# Patient Record
Sex: Male | Born: 1970
Health system: Southern US, Community
[De-identification: ages and names within clinical notes are randomized; demographics above are authoritative.]

## PROBLEM LIST (undated history)

## (undated) DIAGNOSIS — R918 Other nonspecific abnormal finding of lung field: Secondary | ICD-10-CM

## (undated) DIAGNOSIS — K089 Disorder of teeth and supporting structures, unspecified: Secondary | ICD-10-CM

## (undated) DIAGNOSIS — E119 Type 2 diabetes mellitus without complications: Secondary | ICD-10-CM

## (undated) DIAGNOSIS — F1210 Cannabis abuse, uncomplicated: Secondary | ICD-10-CM

## (undated) DIAGNOSIS — J45909 Unspecified asthma, uncomplicated: Secondary | ICD-10-CM

## (undated) DIAGNOSIS — J189 Pneumonia, unspecified organism: Secondary | ICD-10-CM

## (undated) DIAGNOSIS — Z720 Tobacco use: Secondary | ICD-10-CM

## (undated) DIAGNOSIS — IMO0001 Reserved for inherently not codable concepts without codable children: Secondary | ICD-10-CM

## (undated) DIAGNOSIS — I700 Atherosclerosis of aorta: Secondary | ICD-10-CM

## (undated) HISTORY — PX: NO PAST SURGERIES: SHX2092

## (undated) HISTORY — DX: Pneumonia, unspecified organism: J18.9

---

## 1999-04-14 ENCOUNTER — Emergency Department (HOSPITAL_COMMUNITY): Admission: EM | Admit: 1999-04-14 | Discharge: 1999-04-14 | Payer: Self-pay | Admitting: Emergency Medicine

## 2011-09-11 ENCOUNTER — Emergency Department (HOSPITAL_COMMUNITY)
Admission: EM | Admit: 2011-09-11 | Discharge: 2011-09-11 | Disposition: A | Discharge status: Home / Self Care | Payer: Self-pay | Attending: Emergency Medicine | Admitting: Emergency Medicine

## 2011-09-11 DIAGNOSIS — R059 Cough, unspecified: Secondary | ICD-10-CM | POA: Insufficient documentation

## 2011-09-11 DIAGNOSIS — R0602 Shortness of breath: Secondary | ICD-10-CM | POA: Insufficient documentation

## 2011-09-11 DIAGNOSIS — R05 Cough: Secondary | ICD-10-CM | POA: Insufficient documentation

## 2011-09-11 DIAGNOSIS — J45901 Unspecified asthma with (acute) exacerbation: Secondary | ICD-10-CM | POA: Insufficient documentation

## 2011-09-11 DIAGNOSIS — R0789 Other chest pain: Secondary | ICD-10-CM | POA: Insufficient documentation

## 2013-04-29 ENCOUNTER — Encounter (HOSPITAL_COMMUNITY): Payer: Self-pay

## 2013-04-29 ENCOUNTER — Emergency Department (HOSPITAL_COMMUNITY)
Admission: EM | Admit: 2013-04-29 | Discharge: 2013-04-29 | Disposition: A | Discharge status: Home / Self Care | Payer: Self-pay | Attending: Emergency Medicine | Admitting: Emergency Medicine

## 2013-04-29 DIAGNOSIS — Z79899 Other long term (current) drug therapy: Secondary | ICD-10-CM | POA: Insufficient documentation

## 2013-04-29 DIAGNOSIS — J45901 Unspecified asthma with (acute) exacerbation: Secondary | ICD-10-CM | POA: Insufficient documentation

## 2013-04-29 DIAGNOSIS — Z880 Allergy status to penicillin: Secondary | ICD-10-CM | POA: Insufficient documentation

## 2013-04-29 HISTORY — DX: Unspecified asthma, uncomplicated: J45.909

## 2013-04-29 MED ORDER — PREDNISONE 20 MG PO TABS: ORAL_TABLET | ORAL | Status: DC

## 2013-04-29 MED ORDER — ALBUTEROL SULFATE HFA 108 (90 BASE) MCG/ACT IN AERS: 2.0000 | INHALATION_SPRAY | RESPIRATORY_TRACT | Status: DC | PRN

## 2013-04-29 MED ORDER — METHYLPREDNISOLONE SODIUM SUCC 125 MG IJ SOLR: 125.0000 mg | Freq: Once | INTRAMUSCULAR | Status: DC

## 2013-04-29 MED ORDER — ALBUTEROL (5 MG/ML) CONTINUOUS INHALATION SOLN
10.0000 mg/h | INHALATION_SOLUTION | Freq: Once | RESPIRATORY_TRACT | Status: AC
  Administered 2013-04-29: 10 mg/h via RESPIRATORY_TRACT
  Filled 2013-04-29: qty 20

## 2013-04-29 NOTE — ED Provider Notes (Signed)
History     CSN: [id number]  Arrival date & time 04/29/13  0804   First MD Initiated Contact with Patient 04/29/13 [phone]      Chief Complaint  Patient presents with  . Respiratory Distress    (Consider location/radiation/quality/duration/timing/severity/associated sxs/prior treatment) Patient is a 42 y.o. male presenting with shortness of breath. The history is provided by the patient and medical records. No language interpreter was used.  Shortness of Breath Severity:  Moderate Onset quality:  Sudden Duration:  2 hours (Pt woke up this morning, got a drink of water, went back to bed, and had onset of wheezing.  He had no relief with use of his albuterol inhaler.) Timing:  Constant Progression:  Unchanged Chronicity:  Recurrent Context comment:  No precipitating event known to pt.  Relieved by:  Nothing Worsened by:  Nothing tried Ineffective treatments:  Inhaler Associated symptoms: wheezing   Associated symptoms: no fever   Associated symptoms comment:  None. Risk factors: no tobacco use   Risk factors comment:  Pt was given IV Solumedrol by EMS.   Past Medical History  Diagnosis Date  . Asthma     Past Surgical History  Procedure Laterality Date  . No past surgeries      No family history on file.  History  Substance Use Topics  . Smoking status: Never Smoker   . Smokeless tobacco: Not on file  . Alcohol Use: Yes      Review of Systems  Constitutional: Negative for fever and chills.  HENT: Negative.   Eyes: Negative.   Respiratory: Positive for shortness of breath and wheezing.   Cardiovascular: Negative.   Gastrointestinal: Negative.   Genitourinary: Negative.   Musculoskeletal: Negative.   Skin: Negative.   Neurological: Negative.   Psychiatric/Behavioral: Negative.     Allergies  Penicillins  Home Medications   Current Outpatient Rx  Name  Route  Sig  Dispense  Refill  . albuterol (PROVENTIL HFA;VENTOLIN HFA) 108 (90 BASE) MCG/ACT  inhaler   Inhalation   Inhale 2 puffs into the lungs every 4 (four) hours as needed for wheezing or shortness of breath.           BP 122/84  Pulse 93  Temp(Src) 98.3 F (36.8 C) (Oral)  Resp 20  Ht 5\' 4"  (1.626 m)  Wt 140 lb (63.504 kg)  BMI 24.02 kg/m2  SpO2 100%  Physical Exam  Nursing note and vitals reviewed. Constitutional: He is oriented to person, place, and time. He appears well-developed and well-nourished.  In mild distress with wheezing.  Appears younger than stated age.  HENT:  Head: Normocephalic and atraumatic.  Right Ear: External ear normal.  Left Ear: External ear normal.  Mouth/Throat: Oropharynx is clear and moist.  Eyes: Conjunctivae and EOM are normal. Pupils are equal, round, and reactive to light.  Neck: Normal range of motion. Neck supple.  Cardiovascular: Normal rate, regular rhythm and normal heart sounds.   Pulmonary/Chest: Effort normal.  Wheezes heard over both lung fields.  Abdominal: Soft. Bowel sounds are normal.  Musculoskeletal: Normal range of motion. He exhibits no edema and no tenderness.  Neurological: He is alert and oriented to person, place, and time.  No sensory of motor deficit.  Skin: Skin is warm and dry.  Psychiatric: He has a normal mood and affect. His behavior is normal.    ED Course  Procedures (including critical care time)  8:37 AM Pt was seen and ahd physical examination.  IV Solumedrol 125 mg  and continuous albuterol nebulizer treatment with 10 mg per hour for two hours was ordered.  10:53 AM Pt feels much better.  Re-exam shows no wheezing.  Will release with prescriptions for prednisone taper and albuterol inhaler.  No work for 3 days.    1. Asthma attack         Carleene Cooper III, MD 04/29/13 [phone]

## 2013-04-29 NOTE — ED Notes (Addendum)
Pt woke up this morning with trouble breathing.   Meds given PTA:  Solumedrol 125 mg IVP Albuterol Treatment X1 Atrovent 0.5 mg INH

## 2013-06-12 ENCOUNTER — Emergency Department (HOSPITAL_COMMUNITY): Payer: Medicaid Other

## 2013-06-12 ENCOUNTER — Other Ambulatory Visit: Payer: Self-pay

## 2013-06-12 ENCOUNTER — Observation Stay (HOSPITAL_COMMUNITY)
Admission: EM | Admit: 2013-06-12 | Discharge: 2013-06-13 | Disposition: A | Discharge status: Home / Self Care | Payer: Medicaid Other | Attending: Internal Medicine | Admitting: Internal Medicine

## 2013-06-12 ENCOUNTER — Encounter (HOSPITAL_COMMUNITY): Payer: Self-pay | Admitting: *Deleted

## 2013-06-12 DIAGNOSIS — J45901 Unspecified asthma with (acute) exacerbation: Principal | ICD-10-CM | POA: Insufficient documentation

## 2013-06-12 DIAGNOSIS — J9601 Acute respiratory failure with hypoxia: Secondary | ICD-10-CM | POA: Diagnosis present

## 2013-06-12 DIAGNOSIS — R0602 Shortness of breath: Secondary | ICD-10-CM | POA: Insufficient documentation

## 2013-06-12 DIAGNOSIS — J9600 Acute respiratory failure, unspecified whether with hypoxia or hypercapnia: Secondary | ICD-10-CM

## 2013-06-12 DIAGNOSIS — J45909 Unspecified asthma, uncomplicated: Secondary | ICD-10-CM | POA: Diagnosis present

## 2013-06-12 LAB — POCT I-STAT TROPONIN I: Troponin i, poc: 0 ng/mL (ref 0.00–0.08)

## 2013-06-12 LAB — BASIC METABOLIC PANEL
GFR calc Af Amer: >90 mL/min (ref >90)
GFR calc non Af Amer: 86 mL/min — ABNORMAL LOW (ref >90)
Potassium: 3.9 mEq/L (ref 3.5–5.1)
Sodium: 137 mEq/L (ref 135–145)

## 2013-06-12 LAB — CBC
Hemoglobin: 17.8 g/dL — ABNORMAL HIGH (ref 13.0–17.0)
Platelets: 255 10*3/uL (ref 150–400)
RBC: 5.7 MIL/uL (ref 4.22–5.81)
WBC: 8.2 10*3/uL (ref 4.0–10.5)

## 2013-06-12 MED ORDER — ALBUTEROL SULFATE (5 MG/ML) 0.5% IN NEBU
2.5000 mg | INHALATION_SOLUTION | Freq: Four times a day (QID) | RESPIRATORY_TRACT | Status: DC
  Administered 2013-06-12 (×3): 2.5 mg via RESPIRATORY_TRACT
  Filled 2013-06-12 (×3): qty 0.5

## 2013-06-12 MED ORDER — SODIUM CHLORIDE 0.9 % IJ SOLN: 3.0000 mL | INTRAMUSCULAR | Status: DC | PRN

## 2013-06-12 MED ORDER — IPRATROPIUM BROMIDE 0.02 % IN SOLN
0.5000 mg | Freq: Two times a day (BID) | RESPIRATORY_TRACT | Status: DC
  Administered 2013-06-13: 0.5 mg via RESPIRATORY_TRACT
  Filled 2013-06-12: qty 2.5

## 2013-06-12 MED ORDER — AZITHROMYCIN 250 MG PO TABS
250.0000 mg | ORAL_TABLET | Freq: Every day | ORAL | Status: DC
  Administered 2013-06-13: 250 mg via ORAL
  Filled 2013-06-12: qty 1

## 2013-06-12 MED ORDER — IPRATROPIUM BROMIDE 0.02 % IN SOLN
0.5000 mg | Freq: Four times a day (QID) | RESPIRATORY_TRACT | Status: DC
  Administered 2013-06-12 (×3): 0.5 mg via RESPIRATORY_TRACT
  Filled 2013-06-12 (×3): qty 2.5

## 2013-06-12 MED ORDER — PNEUMOCOCCAL VAC POLYVALENT 25 MCG/0.5ML IJ INJ
0.5000 mL | INJECTION | INTRAMUSCULAR | Status: AC
  Administered 2013-06-13: 0.5 mL via INTRAMUSCULAR
  Filled 2013-06-12: qty 0.5

## 2013-06-12 MED ORDER — ALBUTEROL SULFATE (5 MG/ML) 0.5% IN NEBU
2.5000 mg | INHALATION_SOLUTION | Freq: Two times a day (BID) | RESPIRATORY_TRACT | Status: DC
  Administered 2013-06-13: 2.5 mg via RESPIRATORY_TRACT
  Filled 2013-06-12: qty 0.5

## 2013-06-12 MED ORDER — ALBUTEROL SULFATE (5 MG/ML) 0.5% IN NEBU
2.5000 mg | INHALATION_SOLUTION | RESPIRATORY_TRACT | Status: DC
  Administered 2013-06-12: 2.5 mg via RESPIRATORY_TRACT
  Filled 2013-06-12: qty 0.5

## 2013-06-12 MED ORDER — SODIUM CHLORIDE 0.9 % IJ SOLN: 3.0000 mL | Freq: Two times a day (BID) | INTRAMUSCULAR | Status: DC

## 2013-06-12 MED ORDER — ALBUTEROL SULFATE (5 MG/ML) 0.5% IN NEBU
5.0000 mg | INHALATION_SOLUTION | Freq: Once | RESPIRATORY_TRACT | Status: AC
  Administered 2013-06-12: 5 mg via RESPIRATORY_TRACT
  Filled 2013-06-12: qty 1

## 2013-06-12 MED ORDER — BUDESONIDE-FORMOTEROL FUMARATE 80-4.5 MCG/ACT IN AERO
2.0000 | INHALATION_SPRAY | Freq: Two times a day (BID) | RESPIRATORY_TRACT | Status: DC
  Administered 2013-06-12 – 2013-06-13 (×2): 2 via RESPIRATORY_TRACT
  Filled 2013-06-12: qty 6.9

## 2013-06-12 MED ORDER — IPRATROPIUM BROMIDE 0.02 % IN SOLN
0.5000 mg | Freq: Once | RESPIRATORY_TRACT | Status: AC
  Administered 2013-06-12: 0.5 mg via RESPIRATORY_TRACT
  Filled 2013-06-12: qty 2.5

## 2013-06-12 MED ORDER — ALBUTEROL SULFATE (5 MG/ML) 0.5% IN NEBU: 2.5000 mg | INHALATION_SOLUTION | RESPIRATORY_TRACT | Status: DC | PRN

## 2013-06-12 MED ORDER — METHYLPREDNISOLONE SODIUM SUCC 125 MG IJ SOLR
80.0000 mg | Freq: Two times a day (BID) | INTRAMUSCULAR | Status: DC
  Administered 2013-06-12: 80 mg via INTRAVENOUS
  Filled 2013-06-12 (×3): qty 1.28

## 2013-06-12 MED ORDER — BIOTENE DRY MOUTH MT LIQD: 15.0000 mL | Freq: Two times a day (BID) | OROMUCOSAL | Status: DC

## 2013-06-12 MED ORDER — SODIUM CHLORIDE 0.9 % IV SOLN: 250.0000 mL | INTRAVENOUS | Status: DC | PRN

## 2013-06-12 MED ORDER — PREDNISONE 20 MG PO TABS
20.0000 mg | ORAL_TABLET | Freq: Two times a day (BID) | ORAL | Status: DC
  Administered 2013-06-12 – 2013-06-13 (×2): 20 mg via ORAL
  Filled 2013-06-12 (×4): qty 1

## 2013-06-12 MED ORDER — AZITHROMYCIN 500 MG PO TABS
500.0000 mg | ORAL_TABLET | Freq: Every day | ORAL | Status: AC
  Administered 2013-06-12: 500 mg via ORAL
  Filled 2013-06-12: qty 1

## 2013-06-12 MED ORDER — SODIUM CHLORIDE 0.9 % IJ SOLN
3.0000 mL | Freq: Two times a day (BID) | INTRAMUSCULAR | Status: DC
  Administered 2013-06-12 – 2013-06-13 (×3): 3 mL via INTRAVENOUS

## 2013-06-12 MED ORDER — METHYLPREDNISOLONE SODIUM SUCC 125 MG IJ SOLR
125.0000 mg | Freq: Once | INTRAMUSCULAR | Status: AC
  Administered 2013-06-12: 125 mg via INTRAVENOUS
  Filled 2013-06-12: qty 2

## 2013-06-12 NOTE — ED Provider Notes (Signed)
History    CSN: [id number] Arrival date & time 06/12/13  0040  First MD Initiated Contact with Patient 06/12/13 0101     Chief Complaint  Patient presents with  . Shortness of Breath   (Consider location/radiation/quality/duration/timing/severity/associated sxs/prior Treatment) HPI Patient is a 42 yo man with a history of asthma who presents with complaints of SOB and wheezing. Obtained history from wife because of degree of SOB.  Wife says that sx began when he came home from work around 1800. Patient has not had cough or fever. He has never been admitted for asthma excacerbation and is a nonsmoker. He works as a Financial risk analyst.   Past Medical History  Diagnosis Date  . Asthma    Past Surgical History  Procedure Laterality Date  . No past surgeries     No family history on file. History  Substance Use Topics  . Smoking status: Never Smoker   . Smokeless tobacco: Not on file  . Alcohol Use: Yes    Review of Systems Initially unable to obtain from the patient who presented with acute respiratory distress.  Allergies  Penicillins  Home Medications   Current Outpatient Rx  Name  Route  Sig  Dispense  Refill  . albuterol (PROVENTIL HFA;VENTOLIN HFA) 108 (90 BASE) MCG/ACT inhaler   Inhalation   Inhale 2 puffs into the lungs every 4 (four) hours as needed for wheezing.   1 Inhaler   0    BP 110/70  Pulse 83  Resp 13  SpO2 85% Physical Exam Gen: well developed and well nourished appearing, appears in acute respiratory distress, tripod position, room air sats of 75% noted in triage. Head: NCAT Eyes: PERL, EOMI Nose: no epistaixis or rhinorrhea Mouth/throat: mucosa is moist and pink Neck: supple, no stridor Lungs: Severely diminished air movement bilaterally in all lung fields with an occasional high-pitched wheeze, respiratory rate 20-32 times per min, accessory muscle use noted. CV-sinus tach, regular rate, 20 for her well perfused. Abd: soft, notender,  nondistended Back: no ttp, no cva ttp Skin: Diaphoretic Neuro: CN ii-xii grossly intact, no focal deficits Psyche; anxious affect   ED Course  Procedures (including critical care time)  Results for orders placed during the hospital encounter of 06/12/13 (from the past 24 hour(s))  BASIC METABOLIC PANEL     Status: Abnormal   Collection Time    06/12/13 12:54 AM      Result Value Range   Sodium 137  135 - 145 mEq/L   Potassium 3.9  3.5 - 5.1 mEq/L   Chloride 100  96 - 112 mEq/L   CO2 28  19 - 32 mEq/L   Glucose, Bld 107 (*) 70 - 99 mg/dL   BUN 20  6 - 23 mg/dL   Creatinine, Ser 8.65  0.50 - 1.35 mg/dL   Calcium 9.3  8.4 - 78.4 mg/dL   GFR calc non Af Amer 86 (*) >90 mL/min   GFR calc Af Amer >90  >90 mL/min  CBC     Status: Abnormal   Collection Time    06/12/13 12:54 AM      Result Value Range   WBC 8.2  4.0 - 10.5 K/uL   RBC 5.70  4.22 - 5.81 MIL/uL   Hemoglobin 17.8 (*) 13.0 - 17.0 g/dL   HCT 69.6  29.5 - 28.4 %   MCV 90.0  78.0 - 100.0 fL   MCH 31.2  26.0 - 34.0 pg   MCHC 34.7  30.0 -  36.0 g/dL   RDW 16.1  09.6 - 04.5 %   Platelets 255  150 - 400 K/uL  POCT I-STAT TROPONIN I     Status: None   Collection Time    06/12/13  1:04 AM      Result Value Range   Troponin i, poc 0.00  0.00 - 0.08 ng/mL   Comment 3              Dg Chest Portable 1 View  06/12/2013   *RADIOLOGY REPORT*  Clinical Data: Shortness of breath.  PORTABLE CHEST - 1 VIEW  Comparison: None.  Findings: No significant osseous abnormality.  Lungs are clear. No effusion or pneumothorax.  Cardiomediastinal size and contour are within normal limits.  The upper abdomen is unremarkable.  IMPRESSION: No evidence of acute cardiopulmonary disease.   Original Report Authenticated By: Tiburcio Pea   No diagnosis found.  MDM  Patient presented with status asthmatic and acute respiratory distress. Tx with continous nebs and solumedrol. CXR clear. Patient improved. looks and feels better and wheezing has nearly  resolved. Without O2, sats are 93%. However, the patient desats to 85% with ambulation.  Thus, will admit. Case discussed with Dr. Onalee Hua who has accepted to Tele Unit.   CRITICAL CARE Performed by: Brandt Loosen   Total critical care time: 20m  Critical care time was exclusive of separately billable procedures and treating other patients.  Critical care was necessary to treat or prevent imminent or life-threatening deterioration.  Critical care was time spent personally by me on the following activities: development of treatment plan with patient and/or surrogate as well as nursing, discussions with consultants, evaluation of patient's response to treatment, examination of patient, obtaining history from patient or surrogate, ordering and performing treatments and interventions, ordering and review of laboratory studies, ordering and review of radiographic studies, pulse oximetry and re-evaluation of patient's condition.   Brandt Loosen, MD 06/12/13 [id number]

## 2013-06-12 NOTE — ED Notes (Signed)
Pt presents with audible wheezes and tripod positioning. Able to speak in short sentences. States has been out of albuterol inhaler x1 day

## 2013-06-12 NOTE — Care Management Note (Signed)
    Page 1 of 1   06/13/2013     11:45:12 AM   CARE MANAGEMENT NOTE 06/13/2013  Patient:  ANUEL, SITTER   Account Number:  [id number]  Date Initiated:  06/12/2013  Documentation initiated by:  Letha Cape  Subjective/Objective Assessment:   dx acute resp failure  admit as observation- lives with family. pta indep.     Action/Plan:   Anticipated DC Date:  06/13/2013   Anticipated DC Plan:  HOME/SELF CARE      DC Planning Services  CM consult  [facility]  [facility]      Choice offered to / List presented to:             Status of service:  Completed, signed off Medicare Important Message given?   (If response is "NO", the following Medicare IM given date fields will be blank) Date Medicare IM given:   Date Additional Medicare IM given:    Discharge Disposition:  HOME/SELF CARE  Per UR Regulation:  Reviewed for med. necessity/level of care/duration of stay  If discussed at Long Length of Stay Meetings, dates discussed:    Comments:  06/13/13 11:42 Letha Cape RN, BSN [phone] patient is for dc today, appt scheduled at the [facility], and Match Letter given to patient to ast with medications.  Patient has transportation.  Also gave patient paperwork to fill out for eligibility apt for orange card at [facility] clinic.  716/14 16:06 Letha Cape RN, BSN [phone] patient lives with family, pta indep.  patient states he will need ast with meds, he would like to use the [facility],  also gave info to [facility] , awaiting a call back for apt time.  Spoke with MD and he states patient is for possible dc tomorrow.  Patient states he has transportation at dc.

## 2013-06-12 NOTE — Progress Notes (Signed)
[facility] PROGRESS NOTE  Vera Wishart ZOX:[id number] DOB: 08/28/1971 DOA: 06/12/2013 PCP: No PCP Per Patient  HPI:  42 yo male h/o asthma comes in with sob and wheezing since 10pm. Denies cough or fevers. Ran out of his albuterol inhaler 2 days ago. Does not have pcp. No uri symptoms. No sick contacts. Not on any other inhalers except alb which is usually uses 2-3 times a day. On pred tapers 2-4 x a year through urgent cares. Has received solumedrol and freq nebs in ED and feels much better. Initial oxygen sats were below 80% on arrival this has improved, but upon ambulation sats drop. No le edema or swelling. No cp.  Assessment/Plan:  Asthma exacerbation - will need to op his treatment as he is experiencing daily needs of his rescue inhalers and 3 nights/week - continue Zpack/Prednisone - wean off O2 as tolerated - anticipate d/c tomorrow if stable  Code Status: Full Family Communication: family in room  Disposition Plan: home in 1 days  Consultants:  none  Procedures:  none  Antibiotics:  Anti-infectives   Start     Dose/Rate Route Frequency Ordered Stop   06/13/13 1000  azithromycin (ZITHROMAX) tablet 250 mg     250 mg Oral Daily 06/12/13 0529 06/17/13 0959   06/12/13 1000  azithromycin (ZITHROMAX) tablet 500 mg     500 mg Oral Daily 06/12/13 0529 06/12/13 1025     Antibiotics Given (last 72 hours)   Date/Time Action Medication Dose   06/12/13 1025 Given   azithromycin (ZITHROMAX) tablet 500 mg 500 mg     HPI/Subjective: - feeling much better today, breathing seems at baseline per patient.   Objective: Filed Vitals:   06/12/13 0330 06/12/13 0405 06/12/13 0558 06/12/13 1014  BP: 110/70  106/68   Pulse: 83  73   Temp:   98.5 F (36.9 C)   TempSrc:   Oral   Resp: 13  16   Height:   5\' 3"  (1.6 m)   Weight:   75.6 kg (166 lb 10.7 oz)   SpO2: 93% 85% 99% 98%   No intake or output data in the 24 hours ending 06/12/13 1421 Filed Weights   06/12/13 0558   Weight: 75.6 kg (166 lb 10.7 oz)   Exam:  General:  NAD  Cardiovascular: regular rate and rhythm, without MRG  Respiratory: good air movement, clear to auscultation throughout, no wheezing  Abdomen: soft, not tender to palpation, positive bowel sounds  MSK: no peripheral edema  Neuro: CN 2-12 grossly intact, MS 5/5 in all 4  Data Reviewed: Basic Metabolic Panel:  Recent Labs Lab 06/12/13 0054  NA 137  K 3.9  CL 100  CO2 28  GLUCOSE 107*  BUN 20  CREATININE 1.07  CALCIUM 9.3   CBC:  Recent Labs Lab 06/12/13 0054  WBC 8.2  HGB 17.8*  HCT 51.3  MCV 90.0  PLT 255   Studies: Dg Chest Portable 1 View  06/12/2013   *RADIOLOGY REPORT*  Clinical Data: Shortness of breath.  PORTABLE CHEST - 1 VIEW  Comparison: None.  Findings: No significant osseous abnormality.  Lungs are clear. No effusion or pneumothorax.  Cardiomediastinal size and contour are within normal limits.  The upper abdomen is unremarkable.  IMPRESSION: No evidence of acute cardiopulmonary disease.   Original Report Authenticated By: Tiburcio Pea    Scheduled Meds: . albuterol  2.5 mg Nebulization Q6H  . [START ON 06/13/2013] azithromycin  250 mg Oral Daily  . ipratropium  0.5 mg Nebulization Q6H  . [START ON 06/13/2013] pneumococcal 23 valent vaccine  0.5 mL Intramuscular Tomorrow-1000  . predniSONE  20 mg Oral BID WC  . sodium chloride  3 mL Intravenous Q12H  . sodium chloride  3 mL Intravenous Q12H   Continuous Infusions:   Principal Problem:   Acute respiratory failure with hypoxia Active Problems:   Asthma   Asthma exacerbation  Time spent: 35 minutes.  Pamella Pert, MD [facility] Pager [phone]. If 7 PM - 7 AM, please contact night-coverage at [email or web address], password [facility] 06/12/2013, 2:21 PM  LOS: 0 days

## 2013-06-12 NOTE — H&P (Signed)
PCP:   No PCP Per Patient   Chief Complaint:  Sob and wheezing  HPI: 42 yo male h/o asthma comes in with sob and wheezing since 10pm.  Denies cough or fevers.  Ran out of his albuterol inhaler 2 days ago.  Does not have pcp.  No uri symptoms.  No sick contacts.  Not on any other inhalers except alb which is usually uses 2-3 times a day.  On pred tapers 2-4 x a year through urgent cares.  Has received solumedrol and freq nebs in ED and feels much better.  Initial oxygen sats were below 80% on arrival this has improved, but upon ambulation sats drop.  No le edema or swelling.  No cp.  Review of Systems:  Positive and negative as per HPI otherwise all other systems are negative  Past Medical History: Past Medical History  Diagnosis Date  . Asthma    Past Surgical History  Procedure Laterality Date  . No past surgeries      Medications: Prior to Admission medications   Medication Sig Start Date End Date Taking? Authorizing Provider  albuterol (PROVENTIL HFA;VENTOLIN HFA) 108 (90 BASE) MCG/ACT inhaler Inhale 2 puffs into the lungs every 4 (four) hours as needed for wheezing. 04/29/13   Osvaldo Human, MD    Allergies:   Allergies  Allergen Reactions  . Penicillins Rash    Social History:  reports that he has never smoked. He does not have any smokeless tobacco history on file. He reports that  drinks alcohol. He reports that he does not use illicit drugs.  Family History: none  Physical Exam: Filed Vitals:   06/12/13 0230 06/12/13 0300 06/12/13 0330 06/12/13 0405  BP: 106/73 107/71 110/70   Pulse: 85 80 83   Resp: 14 13 13    SpO2: 97% 97% 93% 85%   General appearance: alert, cooperative and no distress Head: Normocephalic, without obvious abnormality, atraumatic Eyes: negative Nose: Nares normal. Septum midline. Mucosa normal. No drainage or sinus tenderness. Neck: no JVD and supple, symmetrical, trachea midline Lungs: wheezes bibasilar Heart: regular rate and  rhythm, S1, S2 normal, no murmur, click, rub or gallop Abdomen: soft, non-tender; bowel sounds normal; no masses,  no organomegaly Extremities: extremities normal, atraumatic, no cyanosis or edema Pulses: 2+ and symmetric Skin: Skin color, texture, turgor normal. No rashes or lesions Neurologic: Grossly normal    Labs on Admission:   Recent Labs  06/12/13 0054  NA 137  K 3.9  CL 100  CO2 28  GLUCOSE 107*  BUN 20  CREATININE 1.07  CALCIUM 9.3    Recent Labs  06/12/13 0054  WBC 8.2  HGB 17.8*  HCT 51.3  MCV 90.0  PLT 255    Radiological Exams on Admission: Dg Chest Portable 1 View  06/12/2013   *RADIOLOGY REPORT*  Clinical Data: Shortness of breath.  PORTABLE CHEST - 1 VIEW  Comparison: None.  Findings: No significant osseous abnormality.  Lungs are clear. No effusion or pneumothorax.  Cardiomediastinal size and contour are within normal limits.  The upper abdomen is unremarkable.  IMPRESSION: No evidence of acute cardiopulmonary disease.   Original Report Authenticated By: Tiburcio Pea    Assessment/Plan  42 yo male with acute hypoxic resp failure due to asthma exacerbation Principal Problem:   Acute respiratory failure with hypoxia Active Problems:   Asthma   Asthma exacerbation  Albuterol.  Iv solumedrol, zpack.  Pt needs pcp.  Should be on advair chronically.  Tele.  Full code.  Joseph Fitzgerald A 06/12/2013, 4:34 AM

## 2013-06-12 NOTE — ED Notes (Signed)
Dr. David at bedside. 

## 2013-06-13 MED ORDER — ALBUTEROL SULFATE HFA 108 (90 BASE) MCG/ACT IN AERS: 2.0000 | INHALATION_SPRAY | RESPIRATORY_TRACT | Status: DC | PRN

## 2013-06-13 MED ORDER — PREDNISONE 20 MG PO TABS: 20.0000 mg | ORAL_TABLET | Freq: Two times a day (BID) | ORAL | Status: DC

## 2013-06-13 MED ORDER — AZITHROMYCIN 250 MG PO TABS: 250.0000 mg | ORAL_TABLET | Freq: Every day | ORAL | Status: DC

## 2013-06-13 MED ORDER — BUDESONIDE-FORMOTEROL FUMARATE 80-4.5 MCG/ACT IN AERO: 2.0000 | INHALATION_SPRAY | Freq: Two times a day (BID) | RESPIRATORY_TRACT | Status: DC

## 2013-06-13 NOTE — Progress Notes (Signed)
Joseph Fitzgerald discharged Home per MD order.  Discharge instructions reviewed and discussed with the patient, all questions and concerns answered. Copy of instructions and scripts given to patient.    Medication List         albuterol 108 (90 BASE) MCG/ACT inhaler  Commonly known as:  PROVENTIL HFA;VENTOLIN HFA  Inhale 2 puffs into the lungs every 4 (four) hours as needed for wheezing.     azithromycin 250 MG tablet  Commonly known as:  ZITHROMAX  Take 1 tablet (250 mg total) by mouth daily.     budesonide-formoterol 80-4.5 MCG/ACT inhaler  Commonly known as:  SYMBICORT  Inhale 2 puffs into the lungs 2 (two) times daily.     predniSONE 20 MG tablet  Commonly known as:  DELTASONE  Take 1 tablet (20 mg total) by mouth 2 (two) times daily with a meal.        Patients skin is clean, dry and intact, no evidence of skin break down. IV site discontinued and catheter remains intact. Site without signs and symptoms of complications. Dressing and pressure applied.  Patient escorted to car by volunteer in a wheelchair,  no distress noted upon discharge.  [name], [name] C 06/13/2013 12:20 PM

## 2013-06-13 NOTE — Discharge Summary (Signed)
Physician Discharge Summary  Joseph Fitzgerald AVW:[id number] DOB: Mar 12, 1971 DOA: 06/12/2013  PCP: No PCP Per Patient  Admit date: 06/12/2013 Discharge date: 06/13/2013  Time spent: 35 minutes  Recommendations for Outpatient Follow-up:  1. Follow up with PCP in 1-2 weeks.   Discharge Diagnoses:  Principal Problem:   Acute respiratory failure with hypoxia Active Problems:   Asthma   Asthma exacerbation  Discharge Condition: stable  Diet recommendation: regular  Filed Weights   06/12/13 0558  Weight: 75.6 kg (166 lb 10.7 oz)   History of present illness:  42 yo male h/o asthma comes in with sob and wheezing since 10pm. Denies cough or fevers. Ran out of his albuterol inhaler 2 days ago. Does not have pcp. No uri symptoms. No sick contacts. Not on any other inhalers except alb which is usually uses 2-3 times a day. On pred tapers 2-4 x a year through urgent cares. Has received solumedrol and freq nebs in ED and feels much better. Initial oxygen sats were below 80% on arrival this has improved, but upon ambulation sats drop. No le edema or swelling. No cp.  Hospital Course:  Asthma exacerbation - this was in the setting of patient not having well controlled asthma as an outpatient and running out of his inhaler a week agol. Will need to up his treatment as he is experiencing daily needs of his rescue inhalers and 3 nights/week. He was started on Symbicort scheduled plus his rescue inhaler. Started on Zpack and Prednisone to complete a taper as an outpatient. Patient's clinical condition improved within hostpital day 2 and was able to be weaned off of O2 and comfortable on room air. Was given information how to establish a PCP and will be followed in the Adult clinic.   Procedures:  none   Consultations:  none  Discharge Exam: Filed Vitals:   06/12/13 2156 06/13/13 0627 06/13/13 0858 06/13/13 0901  BP: 111/67 109/69    Pulse: 57 52    Temp: 98.2 F (36.8 C) 98 F (36.7 C)     TempSrc: Oral Oral    Resp: 18 16    Height:      Weight:      SpO2: 97% 96% 96% 96%   General: NAD Cardiovascular: RRR Respiratory: CTA biL, no wheezing, good air movement.   Discharge Instructions   Future Appointments Provider Department Dept Phone   06/20/2013 12:30 PM [facility] Covering Provider [facility] [phone]       Medication List         albuterol 108 (90 BASE) MCG/ACT inhaler  Commonly known as:  PROVENTIL HFA;VENTOLIN HFA  Inhale 2 puffs into the lungs every 4 (four) hours as needed for wheezing.     azithromycin 250 MG tablet  Commonly known as:  ZITHROMAX  Take 1 tablet (250 mg total) by mouth daily.     budesonide-formoterol 80-4.5 MCG/ACT inhaler  Commonly known as:  SYMBICORT  Inhale 2 puffs into the lungs 2 (two) times daily.     predniSONE 20 MG tablet  Commonly known as:  DELTASONE  Take 1 tablet (20 mg total) by mouth 2 (two) times daily with a meal.           Follow-up Information   Follow up with [facility] On 06/20/2013. (12:30 bring photo id, $20 co pay , paperwork and medication call [phone] for Diane for orange card apt)    Contact information:   [address] [place] [address] [phone]      The results of significant diagnostics from this hospitalization (including imaging, microbiology, ancillary and laboratory) are listed below for reference.    Significant Diagnostic Studies: Dg Chest Portable 1 View  06/12/2013   *RADIOLOGY REPORT*  Clinical Data: Shortness of breath.  PORTABLE CHEST - 1 VIEW  Comparison: None.  Findings: No significant osseous abnormality.  Lungs are clear. No effusion or pneumothorax.  Cardiomediastinal size and contour are within normal limits.  The upper abdomen is unremarkable.  IMPRESSION: No evidence of acute cardiopulmonary disease.   Original Report Authenticated By: Tiburcio Pea   Labs: Basic Metabolic Panel:  Recent  Labs Lab 06/12/13 0054  NA 137  K 3.9  CL 100  CO2 28  GLUCOSE 107*  BUN 20  CREATININE 1.07  CALCIUM 9.3   CBC:  Recent Labs Lab 06/12/13 0054  WBC 8.2  HGB 17.8*  HCT 51.3  MCV 90.0  PLT 255   Signed:  GHERGHE, COSTIN  [facility] 06/13/2013, 11:49 AM

## 2013-06-20 ENCOUNTER — Ambulatory Visit: Payer: Self-pay | Attending: Family Medicine | Admitting: Family Medicine

## 2013-06-20 VITALS — BP 139/92

## 2013-06-20 DIAGNOSIS — J4542 Moderate persistent asthma with status asthmaticus: Secondary | ICD-10-CM

## 2013-06-20 DIAGNOSIS — J45902 Unspecified asthma with status asthmaticus: Secondary | ICD-10-CM

## 2013-06-20 DIAGNOSIS — J45901 Unspecified asthma with (acute) exacerbation: Secondary | ICD-10-CM | POA: Insufficient documentation

## 2013-06-20 NOTE — Progress Notes (Signed)
Patient is here for follow up from hospital Was in there for Asthma

## 2013-06-20 NOTE — Progress Notes (Signed)
Patient ID: Joseph Fitzgerald, male   DOB: 1971-09-17, 42 y.o.   MRN: [id number]  CC: hospital follow up   HPI: Pt has presented to follow up for asthma exacerbation and was hospitalized.  Was dischraged on prednisone taper and antibiotics and symbicort/albuterol.  He is doing much better.  He is breathing better.  He is not short of breath.   Allergies  Allergen Reactions  . Penicillins Rash   Past Medical History  Diagnosis Date  . Asthma    Current Outpatient Prescriptions on File Prior to Visit  Medication Sig Dispense Refill  . albuterol (PROVENTIL HFA;VENTOLIN HFA) 108 (90 BASE) MCG/ACT inhaler Inhale 2 puffs into the lungs every 4 (four) hours as needed for wheezing.  1 Inhaler  5  . azithromycin (ZITHROMAX) 250 MG tablet Take 1 tablet (250 mg total) by mouth daily.  3 each  0  . budesonide-formoterol (SYMBICORT) 80-4.5 MCG/ACT inhaler Inhale 2 puffs into the lungs 2 (two) times daily.  1 Inhaler  5  . predniSONE (DELTASONE) 20 MG tablet Take 1 tablet (20 mg total) by mouth 2 (two) times daily with a meal.  11 tablet  0   No current facility-administered medications on file prior to visit.   History reviewed. No pertinent family history. History   Social History  . Marital Status: Single    Spouse Name: N/A    Number of Children: N/A  . Years of Education: N/A   Occupational History  . Not on file.   Social History Main Topics  . Smoking status: Never Smoker   . Smokeless tobacco: Not on file  . Alcohol Use: Yes  . Drug Use: No  . Sexually Active: Yes   Other Topics Concern  . Not on file   Social History Narrative  . No narrative on file    Review of Systems  Constitutional: Negative for fever, chills, diaphoresis, activity change, appetite change and fatigue.  HENT: Negative for ear pain, nosebleeds, congestion, facial swelling, rhinorrhea, neck pain, neck stiffness and ear discharge.   Eyes: Negative for pain, discharge, redness, itching and visual disturbance.   Respiratory: Negative for cough, choking, chest tightness, shortness of breath, wheezing and stridor.   Cardiovascular: Negative for chest pain, palpitations and leg swelling.  Gastrointestinal: Negative for abdominal distention.  Genitourinary: Negative for dysuria, urgency, frequency, hematuria, flank pain, decreased urine volume, difficulty urinating and dyspareunia.  Musculoskeletal: Negative for back pain, joint swelling, arthralgias and gait problem.  Neurological: Negative for dizziness, tremors, seizures, syncope, facial asymmetry, speech difficulty, weakness, light-headedness, numbness and headaches.  Hematological: Negative for adenopathy. Does not bruise/bleed easily.  Psychiatric/Behavioral: Negative for hallucinations, behavioral problems, confusion, dysphoric mood, decreased concentration and agitation.    Objective:   Filed Vitals:   06/20/13 1220  BP: 139/92    Physical Exam  Constitutional: Appears well-developed and well-nourished. No distress.  HENT: Normocephalic. External right and left ear normal. Oropharynx is stained black. rotting black teeth.  Eyes: Conjunctivae and EOM are normal. PERRLA, no scleral icterus.  Neck: Normal ROM. Neck supple. No JVD. No tracheal deviation. No thyromegaly.  CVS: RRR, S1/S2 +, no murmurs, no gallops, no carotid bruit.  Pulmonary: Effort and breath sounds normal, no stridor, rhonchi, wheezes, rales.  Abdominal: Soft. BS +,  no distension, tenderness, rebound or guarding.  Musculoskeletal: Normal range of motion. No edema and no tenderness.  Lymphadenopathy: No lymphadenopathy noted, cervical, inguinal. Neuro: Alert. Normal reflexes, muscle tone coordination. No cranial nerve deficit. Skin: Skin is warm  and dry. No rash noted. Not diaphoretic. No erythema. No pallor.  Psychiatric: Normal mood and affect. Behavior, judgment, thought content normal.   Lab Results  Component Value Date   WBC 8.2 06/12/2013   HGB 17.8* 06/12/2013    HCT 51.3 06/12/2013   MCV 90.0 06/12/2013   PLT 255 06/12/2013   Lab Results  Component Value Date   CREATININE 1.07 06/12/2013   BUN 20 06/12/2013   NA 137 06/12/2013   K 3.9 06/12/2013   CL 100 06/12/2013   CO2 28 06/12/2013    No results found for this basename: HGBA1C   Lipid Panel  No results found for this basename: chol, trig, hdl, cholhdl, vldl, ldlcalc       Assessment and plan:   Patient Active Problem List   Diagnosis Date Noted  . Asthma    Pt is much better now but strongly advised him to please stop chewing tobacco and using all tobacco and nicotine products.  Pt says that he is working hard to quit.  Pulse ox 99% on room air.   Continue Symbicort and continue rescue albuterol as needed.   RTC in 3 months  The patient was given clear instructions to go to ER or return to medical center if symptoms don't improve, worsen or new problems develop.  The patient verbalized understanding.  The patient was told to call to get any lab results if not heard anything in the next week.    Rodney Langton, MD, CDE, FAAFP [facility] [place], [place]

## 2013-06-20 NOTE — Patient Instructions (Signed)
Smokeless Tobacco Use Smokeless tobacco is a loose, fine, or stringy tobacco. The tobacco is not smoked like a cigarette, but it is chewed or held in the lips or cheeks. It resembles tea and comes from the leaves of the tobacco plant. Smokeless tobacco is usually flavored, sweetened, or processed in some way. Although smokeless tobacco is not smoked into the lungs, its chemicals are absorbed through the membranes in the mouth and into the bloodstream. Its chemicals are also swallowed in saliva. The chemicals (nicotine and other toxins) are known to cause cancer. Smokeless tobacco contains up to 28 differentcarcinogens. CAUSES Nicotine is addictive. Smokeless tobacco contains nicotine, which is a stimulant. This stimulant can give you a "buzz" or altered state. People can become addicted to the feeling it delivers.  SYMPTOMS Smokeless tobacco can cause health problems, including:  Bad breath.  Yellow-brown teeth.  Mouth sores.  Cracking and bleeding lips.  Gum disease, gum recession, and bone loss around the teeth.  Tooth decay.  Increased or irregular heart rate.  High blood pressure, heart disease, and stroke.  Cancer of the mouth, lips, tongue, pancreas, voice box (larynx), esophagus, colon, and bladder.  Precancerous lesion of the soft tissues of the mouth (leukoplakia).  Loss of your sense of taste. TREATMENT Talk with your caregiver about ways you can quit. Quitting tobacco is a good decision for your health. Nicotine is addictive, but several options are available to help you quit including:  Nicotine replacement therapy (gum or patch).  Support and cessation programs. The following tips can help you quit:  Write down the reasons you would like to quit and look at them often.  Set a date during a low stress time to stop or cut back.  Ask family and friends for their support.  Remove all tobacco products from your home and work.  Replace the chewing tobacco with  things like beef jerky, sunflower seeds, or shredded coconut.  Avoid situations that may make you want to chew tobacco.  Exercise and eat a healthy diet.  When you crave tobacco, distract yourself with drinking water, sugarless chewing gum, sugarless hard candy, exercising, or deep breathing. HOME CARE INSTRUCTIONS  See your dentist for regular oral health exams every 6 months.  Follow up with your caregiver as recommended. SEEK MEDICAL CARE OR DENTAL CARE IF:  You have bleeding or cracking lips, gums, or cheeks.  You have mouth sores, discolorations, or pain.  You have tooth pain.  You develop persistent irritation, burning, or sores in the mouth.  You have pain, tenderness, or numbness in the mouth.  You develop a lump, bumpy patch, or hardened skin inside the mouth.  The color changes inside your mouth (gray, white, or red spots).  You have difficulty chewing, swallowing, or speaking. Document Released: 04/18/2011 Document Revised: 02/06/2012 Document Reviewed: 04/18/2011 [facility] Patient Information 2014 [place], [place]. Asthma Attack Prevention HOW CAN ASTHMA BE PREVENTED? Currently, there is no way to prevent asthma from starting. However, you can take steps to control the disease and prevent its symptoms after you have been diagnosed. Learn about your asthma and how to control it. Take an active role to control your asthma by working with your caregiver to create and follow an asthma action plan. An asthma action plan guides you in taking your medicines properly, avoiding factors that make your asthma worse, tracking your level of asthma control, responding to worsening asthma, and seeking emergency care when needed. To track your asthma, keep records of your symptoms,  check your peak flow number using a peak flow meter (handheld device that shows how well air moves out of your lungs), and get regular asthma checkups.  Other ways to prevent asthma attacks include:  Use  medicines as your caregiver directs.  Identify and avoid things that make your asthma worse (as much as you can).  Keep track of your asthma symptoms and level of control.  Get regular checkups for your asthma.  With your caregiver, write a detailed plan for taking medicines and managing an asthma attack. Then be sure to follow your action plan. Asthma is an ongoing condition that needs regular monitoring and treatment.  Identify and avoid asthma triggers. A number of outdoor allergens and irritants (pollen, mold, cold air, air pollution) can trigger asthma attacks. Find out what causes or makes your asthma worse, and take steps to avoid those triggers.  Monitor your breathing. Learn to recognize warning signs of an attack, such as slight coughing, wheezing or shortness of breath. However, your lung function may already decrease before you notice any signs or symptoms, so regularly measure and record your peak airflow with a home peak flow meter.  Identify and treat attacks early. If you act quickly, you're less likely to have a severe attack. You will also need less medicine to control your symptoms. When your peak flow measurements decrease and alert you to an upcoming attack, take your medicine as instructed, and immediately stop any activity that may have triggered the attack. If your symptoms do not improve, get medical help.  Pay attention to increasing quick-relief inhaler use. If you find yourself relying on your quick-relief inhaler (such as albuterol), your asthma is not under control. See your caregiver about adjusting your treatment. IDENTIFY AND CONTROL FACTORS THAT MAKE YOUR ASTHMA WORSE A number of common things can set off or make your asthma symptoms worse (asthma triggers). Keep track of your asthma symptoms for several weeks, detailing all the environmental and emotional factors that are linked with your asthma. When you have an asthma attack, go back to your asthma diary to see  which factor, or combination of factors, might have contributed to it. Once you know what these factors are, you can take steps to control many of them.  Allergies: If you have allergies and asthma, it is important to take asthma prevention steps at home. Asthma attacks (worsening of asthma symptoms) can be triggered by allergies, which can cause temporary increased inflammation of your airways. Minimizing contact with the substance to which you are allergic will help prevent an asthma attack. Animal Dander:   Some people are allergic to the flakes of skin or dried saliva from animals with fur or feathers. Keep these pets out of your home.  If you can't keep a pet outdoors, keep the pet out of your bedroom and other sleeping areas at all times, and keep the door closed.  Remove carpets and furniture covered with cloth from your home. If that is not possible, keep the pet away from fabric-covered furniture and carpets. Dust Mites:  Many people with asthma are allergic to dust mites. Dust mites are tiny bugs that are found in every home, in mattresses, pillows, carpets, fabric-covered furniture, bedcovers, clothes, stuffed toys, fabric, and other fabric-covered items.  Cover your mattress in a special dust-proof cover.  Cover your pillow in a special dust-proof cover, or wash the pillow each week in hot water. Water must be hotter than 130 F to kill dust mites. Cold or  warm water used with detergent and bleach can also be effective.  Wash the sheets and blankets on your bed each week in hot water.  Try not to sleep or lie on cloth-covered cushions.  Call ahead when traveling and ask for a smoke-free hotel room. Bring your own bedding and pillows, in case the hotel only supplies feather pillows and down comforters, which may contain dust mites and cause asthma symptoms.  Remove carpets from your bedroom and those laid on concrete, if you can.  Keep stuffed toys out of the bed, or wash the  toys weekly in hot water or cooler water with detergent and bleach. Cockroaches:  Many people with asthma are allergic to the droppings and remains of cockroaches.  Keep food and garbage in closed containers. Never leave food out.  Use poison baits, traps, powders, gels, or paste (for example, boric acid).  If a spray is used to kill cockroaches, stay out of the room until the odor goes away. Indoor Mold:  Fix leaky faucets, pipes, or other sources of water that have mold around them.  Clean floors and moldy surfaces with a fungicide or diluted bleach.  Avoid using humidifiers, vaporizers, or swamp coolers. These can spread molds through the air. Pollen and Outdoor Mold:  When pollen or mold spore counts are high, try to keep your windows closed.  Stay indoors with windows closed from late morning to afternoon, if you can. Pollen and some mold spore counts are highest at that time.  Ask your caregiver whether you need to take or increase anti-inflammatory medicine before your allergy season starts. Irritants:   Tobacco smoke is an irritant. If you smoke, ask your caregiver how you can quit. Ask family members to quit smoking, too. Do not allow smoking in your home or car.  If possible, do not use a wood-burning stove, kerosene heater, or fireplace. Minimize exposure to all sources of smoke, including incense, candles, fires, and fireworks.  Try to stay away from strong odors and sprays, such as perfume, talcum powder, hair spray, and paints.  Decrease humidity in your home and use an indoor air cleaning device. Reduce indoor humidity to below 60 percent. Dehumidifiers or central air conditioners can do this.  Decrease house dust exposure by changing furnace and air cooler filters frequently.  Try to have someone else vacuum for you once or twice a week, if you can. Stay out of rooms while they are being vacuumed and for a short while afterward.  If you vacuum, use a dust mask  from a hardware store, a double-layered or microfilter vacuum cleaner bag, or a vacuum cleaner with a HEPA filter.  Sulfites in foods and beverages can be irritants. Do not drink beer or wine, or eat dried fruit, processed potatoes, or shrimp if they cause asthma symptoms.  Cold air can trigger an asthma attack. Cover your nose and mouth with a scarf on cold or windy days.  Several health conditions can make asthma more difficult to manage, including runny nose, sinus infections, reflux disease, psychological stress, and sleep apnea. Your caregiver will treat these conditions, as well.  Avoid close contact with people who have a cold or the flu, since your asthma symptoms may get worse if you catch the infection from them. Wash your hands thoroughly after touching items that may have been handled by people with a respiratory infection.  Get a flu shot every year to protect against the flu virus, which often makes asthma worse for  days or weeks. Also get a pneumonia shot once every 5 10 years. Medicines:  Aspirin and other pain relievers can cause asthma attacks. Ten percent to 20% of people with asthma have sensitivity to aspirin or a group of pain relievers called non-steroidal anti-inflammatory medicines (NSAIDS), such as ibuprofen and naproxen. These medicines are used to treat pain and reduce fevers. Asthma attacks caused by any of these medicines can be severe and even fatal. These medicines must be avoided in people who have known aspirin sensitive asthma. Products with acetaminophen are considered safe for people who have asthma. It is important that people with aspirin sensitivity read labels of all over-the-counter medicines used to treat pain, colds, coughs, and fever.  Beta blockers and ACE inhibitors are other medicines which you should discuss with your caregiver, in relation to your asthma. ALLERGY SKIN TESTING  Ask your asthma caregiver about allergy skin testing or blood testing  (RAST test) to identify the allergens to which you are sensitive. If you are found to have allergies, allergy shots (immunotherapy) for asthma may help prevent future allergies and asthma. With allergy shots, small doses of allergens (substances to which you are allergic) are injected under your skin on a regular schedule. Over a period of time, your body may become used to the allergen and less responsive with asthma symptoms. You can also take measures to minimize your exposure to those allergens. EXERCISE  If you have exercise-induced asthma, or are planning vigorous exercise, or exercise in cold, humid, or dry environments, prevent exercise-induced asthma by following your caregiver's advice regarding asthma treatment before exercising. Document Released: 11/02/2009 Document Revised: 10/31/2012 Document Reviewed: 11/02/2009 [facility] Patient Information 2014 [place], [place].

## 2013-06-21 ENCOUNTER — Encounter: Payer: Self-pay | Admitting: Family Medicine

## 2013-09-03 ENCOUNTER — Ambulatory Visit: Payer: Medicaid Other | Attending: Internal Medicine | Admitting: Internal Medicine

## 2013-09-03 ENCOUNTER — Encounter: Payer: Self-pay | Admitting: Internal Medicine

## 2013-09-03 VITALS — BP 133/82 | HR 66 | Temp 98.3°F | Resp 17 | Ht 64.0 in | Wt 169.8 lb

## 2013-09-03 DIAGNOSIS — J4520 Mild intermittent asthma, uncomplicated: Secondary | ICD-10-CM

## 2013-09-03 DIAGNOSIS — Z23 Encounter for immunization: Secondary | ICD-10-CM

## 2013-09-03 DIAGNOSIS — IMO0001 Reserved for inherently not codable concepts without codable children: Secondary | ICD-10-CM

## 2013-09-03 DIAGNOSIS — J45909 Unspecified asthma, uncomplicated: Secondary | ICD-10-CM | POA: Insufficient documentation

## 2013-09-03 NOTE — Progress Notes (Signed)
Patient ID: Joseph Fitzgerald, male   DOB: Mar 30, 1971, 42 y.o.   MRN: [id number] Patient Demographics  Joseph Fitzgerald, is a 42 y.o. male  YQM:[id number]  BMW:[id number]  DOB - [birth year]  Chief Complaint  Patient presents with  . Follow-up        Subjective:   Joseph Fitzgerald is a 42 y.o. male here today for a follow up visit. No new complaints today. No ER visit since the last clinic visit 3 months ago. Night symptoms are minimal. Compliant with medications. Patient does not smoke cigarette Patient has No headache, No chest pain, No abdominal pain - No Nausea, No new weakness tingling or numbness, No Cough - SOB.  ALLERGIES: Allergies  Allergen Reactions  . Penicillins Rash    PAST MEDICAL HISTORY: Past Medical History  Diagnosis Date  . Asthma     MEDICATIONS AT HOME: Prior to Admission medications   Medication Sig Start Date End Date Taking? Authorizing Provider  albuterol (PROVENTIL HFA;VENTOLIN HFA) 108 (90 BASE) MCG/ACT inhaler Inhale 2 puffs into the lungs every 4 (four) hours as needed for wheezing. 06/13/13   Pamella Pert, MD  azithromycin (ZITHROMAX) 250 MG tablet Take 1 tablet (250 mg total) by mouth daily. 06/13/13   Pamella Pert, MD  budesonide-formoterol (SYMBICORT) 80-4.5 MCG/ACT inhaler Inhale 2 puffs into the lungs 2 (two) times daily. 06/13/13   Pamella Pert, MD  predniSONE (DELTASONE) 20 MG tablet Take 1 tablet (20 mg total) by mouth 2 (two) times daily with a meal. 06/13/13   Pamella Pert, MD     Objective:   Filed Vitals:   09/03/13 1225  BP: 133/82  Pulse: 66  Temp: 98.3 F (36.8 C)  Resp: 17  Height: 5\' 4"  (1.626 m)  Weight: 169 lb 12.8 oz (77.021 kg)  SpO2: 100%    Exam General appearance : Awake, alert, not in any distress. Speech Clear. Not toxic looking HEENT: Atraumatic and Normocephalic, pupils equally reactive to light and accomodation Neck: supple, no JVD. No cervical lymphadenopathy.  Chest:Good air entry bilaterally, no added sounds   CVS: S1 S2 regular, no murmurs.  Abdomen: Bowel sounds present, Non tender and not distended with no gaurding, rigidity or rebound. Extremities: B/L Lower Ext shows no edema, both legs are warm to touch Neurology: Awake alert, and oriented X 3, CN II-XII intact, Non focal Skin:No Rash Wounds:N/A   Data Review   CBC No results found for this basename: WBC, HGB, HCT, PLT, MCV, MCH, MCHC, RDW, NEUTRABS, LYMPHSABS, MONOABS, EOSABS, BASOSABS, BANDABS, BANDSABD,  in the last 168 hours  Chemistries   No results found for this basename: NA, K, CL, CO2, GLUCOSE, BUN, CREATININE, GFRCGP, CALCIUM, MG, AST, ALT, ALKPHOS, BILITOT,  in the last 168 hours ------------------------------------------------------------------------------------------------------------------ No results found for this basename: HGBA1C,  in the last 72 hours ------------------------------------------------------------------------------------------------------------------ No results found for this basename: CHOL, HDL, LDLCALC, TRIG, CHOLHDL, LDLDIRECT,  in the last 72 hours ------------------------------------------------------------------------------------------------------------------ No results found for this basename: TSH, T4TOTAL, FREET3, T3FREE, THYROIDAB,  in the last 72 hours ------------------------------------------------------------------------------------------------------------------ No results found for this basename: VITAMINB12, FOLATE, FERRITIN, TIBC, IRON, RETICCTPCT,  in the last 72 hours  Coagulation profile  No results found for this basename: INR, PROTIME,  in the last 168 hours    Assessment & Plan   Patient Active Problem List   Diagnosis Date Noted  . Moderate intermittent asthma 09/03/2013  . Asthma      Plan: Patient counseled extensively about the use of inhalers Patient encouraged to  be compliant with medications  Health Maintenance -Vaccinations:  -Influenza given today  Follow  up in 3 months   The patient was given clear instructions to go to ER or return to medical center if symptoms don't improve, worsen or new problems develop. The patient verbalized understanding. The patient was told to call to get lab results if they haven't heard anything in the next week.    Jeanann Lewandowsky, MD, MHA, FACP, FAAP [facility] [place], [place] [phone]   09/03/2013, 1:01 PM

## 2013-09-03 NOTE — Progress Notes (Signed)
Patient here for follow up wheezing.

## 2013-09-03 NOTE — Patient Instructions (Signed)

## 2013-11-17 ENCOUNTER — Emergency Department (HOSPITAL_COMMUNITY)
Admission: EM | Admit: 2013-11-17 | Discharge: 2013-11-17 | Disposition: A | Discharge status: Home / Self Care | Payer: Self-pay | Attending: Emergency Medicine | Admitting: Emergency Medicine

## 2013-11-17 ENCOUNTER — Emergency Department (HOSPITAL_COMMUNITY): Payer: Self-pay

## 2013-11-17 ENCOUNTER — Encounter (HOSPITAL_COMMUNITY): Payer: Self-pay | Admitting: Emergency Medicine

## 2013-11-17 DIAGNOSIS — R0682 Tachypnea, not elsewhere classified: Secondary | ICD-10-CM | POA: Insufficient documentation

## 2013-11-17 DIAGNOSIS — Z880 Allergy status to penicillin: Secondary | ICD-10-CM | POA: Insufficient documentation

## 2013-11-17 DIAGNOSIS — R000 Tachycardia, unspecified: Secondary | ICD-10-CM | POA: Insufficient documentation

## 2013-11-17 DIAGNOSIS — R0602 Shortness of breath: Secondary | ICD-10-CM | POA: Insufficient documentation

## 2013-11-17 DIAGNOSIS — J45902 Unspecified asthma with status asthmaticus: Secondary | ICD-10-CM | POA: Insufficient documentation

## 2013-11-17 DIAGNOSIS — R079 Chest pain, unspecified: Secondary | ICD-10-CM | POA: Insufficient documentation

## 2013-11-17 DIAGNOSIS — Z8709 Personal history of other diseases of the respiratory system: Secondary | ICD-10-CM | POA: Insufficient documentation

## 2013-11-17 DIAGNOSIS — J45901 Unspecified asthma with (acute) exacerbation: Secondary | ICD-10-CM

## 2013-11-17 DIAGNOSIS — Z79899 Other long term (current) drug therapy: Secondary | ICD-10-CM | POA: Insufficient documentation

## 2013-11-17 MED ORDER — ALBUTEROL SULFATE (5 MG/ML) 0.5% IN NEBU: 10.0000 mg | INHALATION_SOLUTION | Freq: Once | RESPIRATORY_TRACT | Status: DC

## 2013-11-17 MED ORDER — PREDNISONE 20 MG PO TABS
60.0000 mg | ORAL_TABLET | Freq: Once | ORAL | Status: AC
  Administered 2013-11-17: 60 mg via ORAL
  Filled 2013-11-17: qty 3

## 2013-11-17 MED ORDER — ALBUTEROL SULFATE (5 MG/ML) 0.5% IN NEBU
INHALATION_SOLUTION | RESPIRATORY_TRACT | Status: AC
  Filled 2013-11-17: qty 1

## 2013-11-17 MED ORDER — ALBUTEROL SULFATE (5 MG/ML) 0.5% IN NEBU
5.0000 mg | INHALATION_SOLUTION | Freq: Once | RESPIRATORY_TRACT | Status: AC
  Administered 2013-11-17: 5 mg via RESPIRATORY_TRACT

## 2013-11-17 MED ORDER — PREDNISONE 20 MG PO TABS: ORAL_TABLET | ORAL | Status: DC

## 2013-11-17 MED ORDER — OXYCODONE-ACETAMINOPHEN 5-325 MG PO TABS
2.0000 | ORAL_TABLET | Freq: Once | ORAL | Status: AC
  Administered 2013-11-17: 2 via ORAL
  Filled 2013-11-17: qty 2

## 2013-11-17 MED ORDER — IPRATROPIUM BROMIDE 0.02 % IN SOLN
0.5000 mg | Freq: Once | RESPIRATORY_TRACT | Status: AC
  Administered 2013-11-17: 0.5 mg via RESPIRATORY_TRACT
  Filled 2013-11-17: qty 2.5

## 2013-11-17 MED ORDER — ALBUTEROL (5 MG/ML) CONTINUOUS INHALATION SOLN
10.0000 mg/h | INHALATION_SOLUTION | Freq: Once | RESPIRATORY_TRACT | Status: AC
  Administered 2013-11-17: 10 mg/h via RESPIRATORY_TRACT
  Filled 2013-11-17: qty 20

## 2013-11-17 NOTE — ED Notes (Signed)
Pt resting comfortably in room. O2 Saturation 99% at this time.

## 2013-11-17 NOTE — ED Notes (Signed)
Pt. reports asthma attack this evening with wheezing and occasional dry cough for 3 days , denies fever or chills.

## 2013-11-17 NOTE — ED Provider Notes (Signed)
CSN: [id number]     Arrival date & time 11/17/13  0002 History   First MD Initiated Contact with Patient 11/17/13 0100     Chief Complaint  Patient presents with  . Asthma   (Consider location/radiation/quality/duration/timing/severity/associated sxs/prior Treatment) HPI Patient is a 42 yo man with a history of asthma which has required hospitalizations several times in the past - no intubations. He presents with 3 days of a productive cough with wheezing and SOB. He has been compliant with his Albuterol HFA and Pulmicort. But, reports no improvement. The patient denies fever.   He was noted to have pulse 117 and sats of 93% on arrival to the ED. He was treated with Albuterol SVN 5mg  before my assessment. While in triage, he coughed and then felt a sharp pain in his left chest. This pain persists. It is aching, nonradiating, worse with movements.   Past Medical History  Diagnosis Date  . Asthma    Past Surgical History  Procedure Laterality Date  . No past surgeries     Family History  Problem Relation Age of Onset  . Asthma Brother    History  Substance Use Topics  . Smoking status: Never Smoker   . Smokeless tobacco: Current User    Types: Chew  . Alcohol Use: Yes    Review of Systems Ten point review of symptoms performed and is negative with the exception of symptoms noted above.   Allergies  Penicillins  Home Medications   Current Outpatient Rx  Name  Route  Sig  Dispense  Refill  . albuterol (PROVENTIL HFA;VENTOLIN HFA) 108 (90 BASE) MCG/ACT inhaler   Inhalation   Inhale 2 puffs into the lungs every 4 (four) hours as needed for wheezing.   1 Inhaler   5   . budesonide-formoterol (SYMBICORT) 80-4.5 MCG/ACT inhaler   Inhalation   Inhale 2 puffs into the lungs 2 (two) times daily.   1 Inhaler   5    BP 139/97  Pulse 117  Temp(Src) 99.3 F (37.4 C) (Oral)  Resp 18  SpO2 93% Physical Exam Gen: well developed and well nourished appearing Head:  NCAT Eyes: PERL, EOMI Nose: no epistaixis or rhinorrhea Mouth/throat: mucosa is moist and pink Neck: supple, no stridor Lungs: RR 24 to 28/min, diminished BS bilaterally with high pitched wheezing CV: rapid and regular, pulse 108 bpm, extremities well perfused Abd: soft, notender, nondistended Back: no ttp, no cva ttp Skin: no rashese, wnl Ext: no edema Neuro: CN ii-xii grossly intact, no focal deficits Psyche; normal affect,  calm and cooperative.   ED Course  Procedures (including critical care time) Labs Review  Patient with acute asthma excacerbation - inadequate response to initial tx with Albuterol 5mg  SVN. We will tx with continous Albuterol 10mg  and Atrovent 0.5mg  along with prednisone 60mg  and re-evaluate. In light of the patient's complaints of post tussive left sided chest pain, we will obtain a CXR to rule out PTX, infiltrate.   If the patient does not adequately respond to above tx, he will require admission.  DG Chest 2 View (Final result)  Result time: 11/17/13 01:51:04    Final result by Rad Results In Interface (11/17/13 01:51:04)    Narrative:   CLINICAL DATA: Asthma attack.  EXAM: CHEST 2 VIEW  COMPARISON: Chest radiograph June 12, 2013  FINDINGS: Cardiomediastinal silhouette is unremarkable. The lungs are clear without pleural effusions or focal consolidations. Pulmonary vasculature is unremarkable. Trachea projects midline and there is no pneumothorax. Soft tissue  planes and included osseous structures are nonsuspicious.  IMPRESSION: No active cardiopulmonary disease.   Electronically Signed By: Awilda Metro On: 11/17/2013 01:51      MDM  Patient with status asthmaticus. Tx with prednisone, continous Albuterol and Atrovent nebs.   0235: Recheck. Patient is feeling better. Tachypnea and tachycardia has resolved. Wheezing in both fields persists - left > right. Patient about half way through neb. Will re-evaluate for disposition when  complete.  9147:  Patient is feeling better. Sats on RA are 100%, Some mild lingering wheezing right side, left has cleared. Tachypnea has resolved. The patient is stable for discharge with plan for close outpatient f/u.   Brandt Loosen, MD 11/17/13 [id number]

## 2013-12-04 ENCOUNTER — Ambulatory Visit: Payer: Self-pay | Attending: Internal Medicine | Admitting: Internal Medicine

## 2013-12-04 ENCOUNTER — Encounter: Payer: Self-pay | Admitting: Internal Medicine

## 2013-12-04 VITALS — BP 136/97 | HR 71 | Temp 98.5°F | Resp 16 | Ht 63.0 in | Wt 168.0 lb

## 2013-12-04 DIAGNOSIS — J45909 Unspecified asthma, uncomplicated: Secondary | ICD-10-CM

## 2013-12-04 DIAGNOSIS — J4520 Mild intermittent asthma, uncomplicated: Secondary | ICD-10-CM

## 2013-12-04 DIAGNOSIS — IMO0001 Reserved for inherently not codable concepts without codable children: Secondary | ICD-10-CM

## 2013-12-04 MED ORDER — FLUTICASONE PROPIONATE HFA 220 MCG/ACT IN AERO: 2.0000 | INHALATION_SPRAY | Freq: Two times a day (BID) | RESPIRATORY_TRACT | Status: DC

## 2013-12-04 NOTE — Progress Notes (Signed)
Pt is here following up on his asthma.

## 2013-12-04 NOTE — Progress Notes (Signed)
MRN: [id number] Name: Canon Gola  Sex: male Age: 43 y.o. DOB: Jul 29, 1971  Allergies: Penicillins  Chief Complaint  Patient presents with  . Follow-up    HPI: Patient is 43 y.o. male who comes today for followup history of asthma recently went to the emergency room with worsening symptoms patient was started on tapering dose of prednisone currently she is using albuterol, previously he was on Symbicort which she could not afford, already finished a course of prednisone reports improvement in the symptoms, patient does not smoke already up-to-date with flu shot and pneumonia shot.  Past Medical History  Diagnosis Date  . Asthma     Past Surgical History  Procedure Laterality Date  . No past surgeries        Medication List       This list is accurate as of: 12/04/13 12:36 PM.  Always use your most recent med list.               albuterol 108 (90 BASE) MCG/ACT inhaler  Commonly known as:  PROVENTIL HFA;VENTOLIN HFA  Inhale 2 puffs into the lungs every 4 (four) hours as needed for wheezing.     budesonide-formoterol 80-4.5 MCG/ACT inhaler  Commonly known as:  SYMBICORT  Inhale 2 puffs into the lungs 2 (two) times daily.     fluticasone 220 MCG/ACT inhaler  Commonly known as:  FLOVENT HFA  Inhale 2 puffs into the lungs 2 (two) times daily.     predniSONE 20 MG tablet  Commonly known as:  DELTASONE  3 tabs po day one, then 2 tabs daily x 4 days        Meds ordered this encounter  Medications  . fluticasone (FLOVENT HFA) 220 MCG/ACT inhaler    Sig: Inhale 2 puffs into the lungs 2 (two) times daily.    Dispense:  1 Inhaler    Refill:  3    Immunization History  Administered Date(s) Administered  . Influenza Split 09/03/2013  . Pneumococcal Polysaccharide-23 06/13/2013    Family History  Problem Relation Age of Onset  . Asthma Brother     History  Substance Use Topics  . Smoking status: Never Smoker   . Smokeless tobacco: Current User    Types: Chew   . Alcohol Use: Yes    Review of Systems  As noted in HPI  Filed Vitals:   12/04/13 1212  BP: 136/97  Pulse: 71  Temp: 98.5 F (36.9 C)  Resp: 16    Physical Exam  Physical Exam  Constitutional: No distress.  Eyes: EOM are normal. Pupils are equal, round, and reactive to light.  Cardiovascular: Normal rate and regular rhythm.   Pulmonary/Chest: Breath sounds normal. No respiratory distress. He has no wheezes. He has no rales.  Musculoskeletal: He exhibits no edema.    CBC    Component Value Date/Time   WBC 8.2 06/12/2013 0054   RBC 5.70 06/12/2013 0054   HGB 17.8* 06/12/2013 0054   HCT 51.3 06/12/2013 0054   PLT 255 06/12/2013 0054   MCV 90.0 06/12/2013 0054    CMP     Component Value Date/Time   NA 137 06/12/2013 0054   K 3.9 06/12/2013 0054   CL 100 06/12/2013 0054   CO2 28 06/12/2013 0054   GLUCOSE 107* 06/12/2013 0054   BUN 20 06/12/2013 0054   CREATININE 1.07 06/12/2013 0054   CALCIUM 9.3 06/12/2013 0054   GFRNONAA 86* 06/12/2013 0054   GFRAA >90 06/12/2013 0054  No results found for this basename: chol, tri, ldl    No components found with this basename: hga1c    No results found for this basename: AST    Assessment and Plan  Moderate intermittent asthma - Plan: Continue with albuterol when necessary I have started patient on fluticasone (FLOVENT HFA) 220 MCG/ACT inhaler twice a day maintenance.   Health Maintenance Up-to-date with flu shot and pneumonia shot  Return in about 3 months (around 03/04/2014).  Lorayne Marek, MD

## 2013-12-18 ENCOUNTER — Ambulatory Visit: Payer: Self-pay | Attending: Internal Medicine

## 2013-12-18 ENCOUNTER — Other Ambulatory Visit: Payer: Self-pay | Admitting: Internal Medicine

## 2013-12-18 DIAGNOSIS — J4520 Mild intermittent asthma, uncomplicated: Secondary | ICD-10-CM

## 2013-12-18 DIAGNOSIS — IMO0001 Reserved for inherently not codable concepts without codable children: Secondary | ICD-10-CM

## 2013-12-18 MED ORDER — FLUTICASONE PROPIONATE HFA 220 MCG/ACT IN AERO: 2.0000 | INHALATION_SPRAY | Freq: Two times a day (BID) | RESPIRATORY_TRACT | Status: DC

## 2013-12-18 MED ORDER — BUDESONIDE-FORMOTEROL FUMARATE 80-4.5 MCG/ACT IN AERO
2.0000 | INHALATION_SPRAY | Freq: Two times a day (BID) | RESPIRATORY_TRACT | Status: DC
Start: 2013-12-18 — End: 2014-04-16

## 2013-12-18 MED ORDER — ALBUTEROL SULFATE HFA 108 (90 BASE) MCG/ACT IN AERS: 2.0000 | INHALATION_SPRAY | RESPIRATORY_TRACT | Status: DC | PRN

## 2014-03-04 ENCOUNTER — Ambulatory Visit: Payer: Self-pay | Admitting: Internal Medicine

## 2014-04-16 ENCOUNTER — Encounter: Payer: Self-pay | Admitting: Internal Medicine

## 2014-04-16 ENCOUNTER — Ambulatory Visit: Payer: Self-pay | Attending: Internal Medicine | Admitting: Internal Medicine

## 2014-04-16 VITALS — BP 117/84 | HR 66 | Temp 98.8°F | Resp 16 | Wt 175.6 lb

## 2014-04-16 DIAGNOSIS — Z9109 Other allergy status, other than to drugs and biological substances: Secondary | ICD-10-CM | POA: Insufficient documentation

## 2014-04-16 DIAGNOSIS — F17200 Nicotine dependence, unspecified, uncomplicated: Secondary | ICD-10-CM | POA: Insufficient documentation

## 2014-04-16 DIAGNOSIS — J45909 Unspecified asthma, uncomplicated: Secondary | ICD-10-CM | POA: Insufficient documentation

## 2014-04-16 DIAGNOSIS — J4520 Mild intermittent asthma, uncomplicated: Secondary | ICD-10-CM

## 2014-04-16 DIAGNOSIS — Z79899 Other long term (current) drug therapy: Secondary | ICD-10-CM | POA: Insufficient documentation

## 2014-04-16 DIAGNOSIS — IMO0001 Reserved for inherently not codable concepts without codable children: Secondary | ICD-10-CM

## 2014-04-16 DIAGNOSIS — Z139 Encounter for screening, unspecified: Secondary | ICD-10-CM

## 2014-04-16 MED ORDER — CETIRIZINE HCL 10 MG PO TABS: 10.0000 mg | ORAL_TABLET | Freq: Every day | ORAL | Status: DC

## 2014-04-16 NOTE — Progress Notes (Signed)
MRN: [id number] Name: Joseph Fitzgerald  Sex: male Age: 43 y.o. DOB: [birth year]  Allergies: Penicillins  Chief Complaint  Patient presents with  . Follow-up    HPI: Patient is 43 y.o. male who has history of asthma comes today for followup as per patient he has been using his Flovent everyday and rarely occurs to use albuterol, recently has been having lot of allergy symptoms itchy eyes stuffy nose denies any fever chills chest and shortness of breath, denies any eye pain  pain or discharge, patient denies smoking cigarettes. Her  Past Medical History  Diagnosis Date  . Asthma     Past Surgical History  Procedure Laterality Date  . No past surgeries        Medication List       This list is accurate as of: 04/16/14  2:21 PM.  Always use your most recent med list.               albuterol 108 (90 BASE) MCG/ACT inhaler  Commonly known as:  PROVENTIL HFA;VENTOLIN HFA  Inhale 2 puffs into the lungs every 4 (four) hours as needed for wheezing.     cetirizine 10 MG tablet  Commonly known as:  ZYRTEC  Take 1 tablet (10 mg total) by mouth daily.     fluticasone 220 MCG/ACT inhaler  Commonly known as:  FLOVENT HFA  Inhale 2 puffs into the lungs 2 (two) times daily.     predniSONE 20 MG tablet  Commonly known as:  DELTASONE  3 tabs po day one, then 2 tabs daily x 4 days        Meds ordered this encounter  Medications  . cetirizine (ZYRTEC) 10 MG tablet    Sig: Take 1 tablet (10 mg total) by mouth daily.    Dispense:  30 tablet    Refill:  3    Immunization History  Administered Date(s) Administered  . Influenza Split 09/03/2013  . Pneumococcal Polysaccharide-23 06/13/2013    Family History  Problem Relation Age of Onset  . Asthma Brother     History  Substance Use Topics  . Smoking status: Never Smoker   . Smokeless tobacco: Current User    Types: Chew  . Alcohol Use: Yes    Review of Systems   As noted in HPI  Filed Vitals:   04/16/14 1405    BP: 117/84  Pulse: 66  Temp: 98.8 F (37.1 C)  Resp: 16    Physical Exam  Physical Exam  HENT:  Stuffy nose no sinus tenderness  Eyes: Pupils are equal, round, and reactive to light.  Conjunctivae  injection, no apparent discharge  Cardiovascular: Normal rate.   Pulmonary/Chest: Breath sounds normal. No respiratory distress. He has no wheezes. He has no rales.    CBC    Component Value Date/Time   WBC 8.2 06/12/2013 0054   RBC 5.70 06/12/2013 0054   HGB 17.8* 06/12/2013 0054   HCT 51.3 06/12/2013 0054   PLT 255 06/12/2013 0054   MCV 90.0 06/12/2013 0054    CMP     Component Value Date/Time   NA 137 06/12/2013 0054   K 3.9 06/12/2013 0054   CL 100 06/12/2013 0054   CO2 28 06/12/2013 0054   GLUCOSE 107* 06/12/2013 0054   BUN 20 06/12/2013 0054   CREATININE 1.07 06/12/2013 0054   CALCIUM 9.3 06/12/2013 0054   GFRNONAA 86* 06/12/2013 0054   GFRAA >90 06/12/2013 0054    No results found  for this basename: chol, tri, ldl    No components found with this basename: hga1c    No results found for this basename: AST    Assessment and Plan  Moderate intermittent asthma Symptoms well controlled continue with Flovent and albuterol when necessary.   Environmental allergies - Plan: cetirizine (ZYRTEC) 10 MG tablet  Screening - Plan: Ordered fasting blood work Lipid panel, TSH, CBC with Differential, COMPLETE METABOLIC PANEL WITH GFR, Vit D  25 hydroxy (rtn osteoporosis monitoring)  Return in about 3 months (around 07/17/2014) for asthma.  Lorayne Marek, MD

## 2014-04-16 NOTE — Progress Notes (Signed)
Patient here for follow up on his asthma

## 2014-08-11 ENCOUNTER — Telehealth: Payer: Self-pay | Admitting: Internal Medicine

## 2014-08-11 NOTE — Telephone Encounter (Signed)
Patient needs to get Lab work. According with his conversation with Dr. Annitta Needs he needs to have the lab work before his 3 months f/u visit. Please confirm these information in order to schedule Lab and doctor visit appointments.

## 2014-08-18 ENCOUNTER — Ambulatory Visit: Payer: Self-pay | Attending: Internal Medicine

## 2014-08-18 DIAGNOSIS — Z139 Encounter for screening, unspecified: Secondary | ICD-10-CM

## 2014-08-18 LAB — CBC WITH DIFFERENTIAL/PLATELET
Basophils Absolute: 0.1 10*3/uL (ref 0.0–0.1)
Basophils Relative: 1 % (ref 0–1)
EOS ABS: 0.4 10*3/uL (ref 0.0–0.7)
EOS PCT: 6 % — AB (ref 0–5)
HCT: 47 % (ref 39.0–52.0)
HEMOGLOBIN: 16.6 g/dL (ref 13.0–17.0)
LYMPHS ABS: 2.4 10*3/uL (ref 0.7–4.0)
LYMPHS PCT: 39 % (ref 12–46)
MCH: 32.5 pg (ref 26.0–34.0)
MCHC: 35.3 g/dL (ref 30.0–36.0)
MCV: 92 fL (ref 78.0–100.0)
Monocytes Absolute: 0.4 10*3/uL (ref 0.1–1.0)
Monocytes Relative: 7 % (ref 3–12)
NEUTROS ABS: 2.9 10*3/uL (ref 1.7–7.7)
NEUTROS PCT: 47 % (ref 43–77)
PLATELETS: 246 10*3/uL (ref 150–400)
RBC: 5.11 MIL/uL (ref 4.22–5.81)
RDW: 13.1 % (ref 11.5–15.5)
WBC: 6.2 10*3/uL (ref 4.0–10.5)

## 2014-08-18 LAB — COMPLETE METABOLIC PANEL WITH GFR
ALBUMIN: 4.3 g/dL (ref 3.5–5.2)
ALT: 16 U/L (ref 0–53)
AST: 19 U/L (ref 0–37)
Alkaline Phosphatase: 120 U/L — ABNORMAL HIGH (ref 39–117)
BUN: 14 mg/dL (ref 6–23)
CO2: 29 meq/L (ref 19–32)
Calcium: 9.8 mg/dL (ref 8.4–10.5)
Chloride: 103 mEq/L (ref 96–112)
Creat: 0.99 mg/dL (ref 0.50–1.35)
GFR, Est African American: >89 mL/min
Glucose, Bld: 106 mg/dL — ABNORMAL HIGH (ref 70–99)
POTASSIUM: 5.5 meq/L — AB (ref 3.5–5.3)
Sodium: 141 mEq/L (ref 135–145)
Total Bilirubin: 0.4 mg/dL (ref 0.2–1.2)
Total Protein: 7.6 g/dL (ref 6.0–8.3)

## 2014-08-18 LAB — LIPID PANEL
CHOL/HDL RATIO: 3.9 ratio
CHOLESTEROL: 220 mg/dL — AB (ref 0–200)
HDL: 57 mg/dL (ref >39)
LDL CALC: 145 mg/dL — AB (ref 0–99)
Triglycerides: 92 mg/dL (ref <150)
VLDL: 18 mg/dL (ref 0–40)

## 2014-08-18 LAB — TSH: TSH: 2.104 u[IU]/mL (ref 0.350–4.500)

## 2014-08-19 LAB — VITAMIN D 25 HYDROXY (VIT D DEFICIENCY, FRACTURES): Vit D, 25-Hydroxy: 16 ng/mL — ABNORMAL LOW (ref 30–89)

## 2015-01-15 ENCOUNTER — Ambulatory Visit: Payer: Self-pay | Attending: Internal Medicine

## 2015-01-21 ENCOUNTER — Other Ambulatory Visit: Payer: Self-pay | Admitting: Internal Medicine

## 2015-01-21 DIAGNOSIS — J4520 Mild intermittent asthma, uncomplicated: Secondary | ICD-10-CM

## 2015-01-21 DIAGNOSIS — IMO0001 Reserved for inherently not codable concepts without codable children: Secondary | ICD-10-CM

## 2015-01-21 MED ORDER — FLUTICASONE PROPIONATE HFA 220 MCG/ACT IN AERO: 2.0000 | INHALATION_SPRAY | Freq: Two times a day (BID) | RESPIRATORY_TRACT | Status: DC

## 2015-01-21 MED ORDER — ALBUTEROL SULFATE HFA 108 (90 BASE) MCG/ACT IN AERS: 2.0000 | INHALATION_SPRAY | RESPIRATORY_TRACT | Status: DC | PRN

## 2015-01-22 ENCOUNTER — Ambulatory Visit: Payer: Self-pay | Attending: Internal Medicine | Admitting: Internal Medicine

## 2015-01-22 ENCOUNTER — Encounter: Payer: Self-pay | Admitting: Internal Medicine

## 2015-01-22 VITALS — BP 127/90 | HR 65 | Temp 98.0°F | Resp 16 | Wt 164.6 lb

## 2015-01-22 DIAGNOSIS — IMO0001 Reserved for inherently not codable concepts without codable children: Secondary | ICD-10-CM

## 2015-01-22 DIAGNOSIS — J4520 Mild intermittent asthma, uncomplicated: Secondary | ICD-10-CM

## 2015-01-22 DIAGNOSIS — Z23 Encounter for immunization: Secondary | ICD-10-CM | POA: Insufficient documentation

## 2015-01-22 DIAGNOSIS — R7301 Impaired fasting glucose: Secondary | ICD-10-CM | POA: Insufficient documentation

## 2015-01-22 DIAGNOSIS — Z833 Family history of diabetes mellitus: Secondary | ICD-10-CM | POA: Insufficient documentation

## 2015-01-22 DIAGNOSIS — E559 Vitamin D deficiency, unspecified: Secondary | ICD-10-CM | POA: Insufficient documentation

## 2015-01-22 DIAGNOSIS — J45998 Other asthma: Secondary | ICD-10-CM | POA: Insufficient documentation

## 2015-01-22 DIAGNOSIS — Z7951 Long term (current) use of inhaled steroids: Secondary | ICD-10-CM | POA: Insufficient documentation

## 2015-01-22 LAB — COMPLETE METABOLIC PANEL WITH GFR
ALT: 20 U/L (ref 0–53)
AST: 26 U/L (ref 0–37)
Albumin: 4.2 g/dL (ref 3.5–5.2)
Alkaline Phosphatase: 104 U/L (ref 39–117)
BUN: 17 mg/dL (ref 6–23)
CALCIUM: 9.2 mg/dL (ref 8.4–10.5)
CHLORIDE: 104 meq/L (ref 96–112)
CO2: 27 meq/L (ref 19–32)
Creat: 0.97 mg/dL (ref 0.50–1.35)
GFR, Est African American: >89 mL/min
GFR, Est Non African American: >89 mL/min
Glucose, Bld: 96 mg/dL (ref 70–99)
Potassium: 5 mEq/L (ref 3.5–5.3)
SODIUM: 140 meq/L (ref 135–145)
Total Bilirubin: 0.3 mg/dL (ref 0.2–1.2)
Total Protein: 7.4 g/dL (ref 6.0–8.3)

## 2015-01-22 LAB — HEMOGLOBIN A1C
Hgb A1c MFr Bld: 5.4 % (ref <5.7)
Mean Plasma Glucose: 108 mg/dL (ref <117)

## 2015-01-22 MED ORDER — VITAMIN D (ERGOCALCIFEROL) 1.25 MG (50000 UNIT) PO CAPS: 50000.0000 [IU] | ORAL_CAPSULE | ORAL | Status: DC

## 2015-01-22 MED ORDER — ALBUTEROL SULFATE HFA 108 (90 BASE) MCG/ACT IN AERS: 2.0000 | INHALATION_SPRAY | RESPIRATORY_TRACT | Status: DC | PRN

## 2015-01-22 MED ORDER — FLUTICASONE PROPIONATE HFA 220 MCG/ACT IN AERO: 2.0000 | INHALATION_SPRAY | Freq: Two times a day (BID) | RESPIRATORY_TRACT | Status: DC

## 2015-01-22 NOTE — Progress Notes (Signed)
Patient here for follow up on his asthma Patient states he has been wheezing Patient declined a breathing treatment Needs refills on his inhalers

## 2015-01-22 NOTE — Progress Notes (Signed)
MRN: [id number] Name: Joseph Fitzgerald  Sex: male Age: 44 y.o. DOB: 1971/05/30  Allergies: Penicillins  Chief Complaint  Patient presents with  . Asthma    HPI: Patient is 44 y.o. male who history of asthma, as per patient he ran out of his medication Flovent but has been using albuterol when necessary, patient denies smoking cigarettes, also previous blood work reviewed with the patient noticed impaired fasting glucose and elevated cholesterol, he does report family history of diabetes, also noted vitamin D deficiency. Patient currently denies any chest pain or shortness of breath.  Past Medical History  Diagnosis Date  . Asthma     Past Surgical History  Procedure Laterality Date  . No past surgeries        Medication List       This list is accurate as of: 01/22/15 10:27 AM.  Always use your most recent med list.               albuterol 108 (90 BASE) MCG/ACT inhaler  Commonly known as:  PROVENTIL HFA;VENTOLIN HFA  Inhale 2 puffs into the lungs every 4 (four) hours as needed for wheezing.     cetirizine 10 MG tablet  Commonly known as:  ZYRTEC  Take 1 tablet (10 mg total) by mouth daily.     fluticasone 220 MCG/ACT inhaler  Commonly known as:  FLOVENT HFA  Inhale 2 puffs into the lungs 2 (two) times daily.     predniSONE 20 MG tablet  Commonly known as:  DELTASONE  3 tabs po day one, then 2 tabs daily x 4 days     Vitamin D (Ergocalciferol) 50000 UNITS Caps capsule  Commonly known as:  DRISDOL  Take 1 capsule (50,000 Units total) by mouth every 7 (seven) days.        Meds ordered this encounter  Medications  . albuterol (PROVENTIL HFA;VENTOLIN HFA) 108 (90 BASE) MCG/ACT inhaler    Sig: Inhale 2 puffs into the lungs every 4 (four) hours as needed for wheezing.    Dispense:  3 Inhaler    Refill:  3  . fluticasone (FLOVENT HFA) 220 MCG/ACT inhaler    Sig: Inhale 2 puffs into the lungs 2 (two) times daily.    Dispense:  3 Inhaler    Refill:  3  .  Vitamin D, Ergocalciferol, (DRISDOL) 50000 UNITS CAPS capsule    Sig: Take 1 capsule (50,000 Units total) by mouth every 7 (seven) days.    Dispense:  12 capsule    Refill:  0    Immunization History  Administered Date(s) Administered  . Influenza Split 09/03/2013  . Influenza,inj,Quad PF,36+ Mos 01/22/2015  . Pneumococcal Polysaccharide-23 06/13/2013    Family History  Problem Relation Age of Onset  . Asthma Brother     History  Substance Use Topics  . Smoking status: Never Smoker   . Smokeless tobacco: Current User    Types: Chew  . Alcohol Use: Yes    Review of Systems   As noted in HPI  Filed Vitals:   01/22/15 0946  BP: 127/90  Pulse: 65  Temp: 98 F (36.7 C)  Resp: 16    Physical Exam  Physical Exam  Constitutional: No distress.  Eyes: EOM are normal. Pupils are equal, round, and reactive to light.  Cardiovascular: Normal rate and regular rhythm.   Pulmonary/Chest: He has wheezes. He has no rales. He exhibits no tenderness.  Musculoskeletal: He exhibits no edema.    CBC  Component Value Date/Time   WBC 6.2 08/18/2014 0920   RBC 5.11 08/18/2014 0920   HGB 16.6 08/18/2014 0920   HCT 47.0 08/18/2014 0920   PLT 246 08/18/2014 0920   MCV 92.0 08/18/2014 0920   LYMPHSABS 2.4 08/18/2014 0920   MONOABS 0.4 08/18/2014 0920   EOSABS 0.4 08/18/2014 0920   BASOSABS 0.1 08/18/2014 0920    CMP     Component Value Date/Time   NA 141 08/18/2014 0920   K 5.5* 08/18/2014 0920   CL 103 08/18/2014 0920   CO2 29 08/18/2014 0920   GLUCOSE 106* 08/18/2014 0920   BUN 14 08/18/2014 0920   CREATININE 0.99 08/18/2014 0920   CREATININE 1.07 06/12/2013 0054   CALCIUM 9.8 08/18/2014 0920   PROT 7.6 08/18/2014 0920   ALBUMIN 4.3 08/18/2014 0920   AST 19 08/18/2014 0920   ALT 16 08/18/2014 0920   ALKPHOS 120* 08/18/2014 0920   BILITOT 0.4 08/18/2014 0920   GFRNONAA >89 08/18/2014 0920   GFRNONAA 86* 06/12/2013 0054   GFRAA >89 08/18/2014 0920   GFRAA >90  06/12/2013 0054    Lab Results  Component Value Date/Time   CHOL 220* 08/18/2014 09:20 AM    No components found for: HGA1C  Lab Results  Component Value Date/Time   AST 19 08/18/2014 09:20 AM    Assessment and Plan  Moderate intermittent asthma without complication - Plan: resume back on Flovent, use albuterol when necessary albuterol (PROVENTIL HFA;VENTOLIN HFA) 108 (90 BASE) MCG/ACT inhaler, fluticasone (FLOVENT HFA) 220 MCG/ACT inhaler, patient is given flu shot today.  Vitamin D deficiency - Plan: Vitamin D, Ergocalciferol, (DRISDOL) 50000 UNITS CAPS capsule  IFG (impaired fasting glucose) - Plan: advised patient for low carbohydrate diet. COMPLETE METABOLIC PANEL WITH GFR, Hemoglobin A1c  Needs flu shot   Health Maintenance  -Vaccinations:  Flu shot given today  Return in about 3 months (around 04/22/2015) for asthma.   This note has been created with Surveyor, quantity. Any transcriptional errors are unintentional.    Lorayne Marek, MD

## 2015-01-22 NOTE — Patient Instructions (Signed)
Diabetes Mellitus and Food It is important for you to manage your blood sugar (glucose) level. Your blood glucose level can be greatly affected by what you eat. Eating healthier foods in the appropriate amounts throughout the day at about the same time each day will help you control your blood glucose level. It can also help slow or prevent worsening of your diabetes mellitus. Healthy eating may even help you improve the level of your blood pressure and reach or maintain a healthy weight.  HOW CAN FOOD AFFECT ME? Carbohydrates Carbohydrates affect your blood glucose level more than any other type of food. Your dietitian will help you determine how many carbohydrates to eat at each meal and teach you how to count carbohydrates. Counting carbohydrates is important to keep your blood glucose at a healthy level, especially if you are using insulin or taking certain medicines for diabetes mellitus. Alcohol Alcohol can cause sudden decreases in blood glucose (hypoglycemia), especially if you use insulin or take certain medicines for diabetes mellitus. Hypoglycemia can be a life-threatening condition. Symptoms of hypoglycemia (sleepiness, dizziness, and disorientation) are similar to symptoms of having too much alcohol.  If your health care provider has given you approval to drink alcohol, do so in moderation and use the following guidelines:  Women should not have more than one drink per day, and men should not have more than two drinks per day. One drink is equal to:  12 oz of beer.  5 oz of wine.  1 oz of hard liquor.  Do not drink on an empty stomach.  Keep yourself hydrated. Have water, diet soda, or unsweetened iced tea.  Regular soda, juice, and other mixers might contain a lot of carbohydrates and should be counted. WHAT FOODS ARE NOT RECOMMENDED? As you make food choices, it is important to remember that all foods are not the same. Some foods have fewer nutrients per serving than other  foods, even though they might have the same number of calories or carbohydrates. It is difficult to get your body what it needs when you eat foods with fewer nutrients. Examples of foods that you should avoid that are high in calories and carbohydrates but low in nutrients include:  Trans fats (most processed foods list trans fats on the Nutrition Facts label).  Regular soda.  Juice.  Candy.  Sweets, such as cake, pie, doughnuts, and cookies.  Fried foods. WHAT FOODS CAN I EAT? Have nutrient-rich foods, which will nourish your body and keep you healthy. The food you should eat also will depend on several factors, including:  The calories you need.  The medicines you take.  Your weight.  Your blood glucose level.  Your blood pressure level.  Your cholesterol level. You also should eat a variety of foods, including:  Protein, such as meat, poultry, fish, tofu, nuts, and seeds (lean animal proteins are best).  Fruits.  Vegetables.  Dairy products, such as milk, cheese, and yogurt (low fat is best).  Breads, grains, pasta, cereal, rice, and beans.  Fats such as olive oil, trans fat-free margarine, canola oil, avocado, and olives. DOES EVERYONE WITH DIABETES MELLITUS HAVE THE SAME MEAL PLAN? Because every person with diabetes mellitus is different, there is not one meal plan that works for everyone. It is very important that you meet with a dietitian who will help you create a meal plan that is just right for you. Document Released: 08/11/2005 Document Revised: 11/19/2013 Document Reviewed: 10/11/2013 [employer] Patient Information 2015 [employer]. This   information is not intended to replace advice given to you by your health care provider. Make sure you discuss any questions you have with your health care provider.

## 2015-01-27 ENCOUNTER — Telehealth: Payer: Self-pay

## 2015-01-27 NOTE — Telephone Encounter (Signed)
Spoke with patient's wife and she is aware of her husband's lab results

## 2015-01-27 NOTE — Telephone Encounter (Signed)
-----   Message from Lorayne Marek, MD sent at 01/23/2015  9:17 AM EST ----- .Call and let the Patient know that blood work is normal.

## 2015-03-27 ENCOUNTER — Emergency Department (INDEPENDENT_AMBULATORY_CARE_PROVIDER_SITE_OTHER): Payer: Self-pay

## 2015-03-27 ENCOUNTER — Encounter (HOSPITAL_COMMUNITY): Payer: Self-pay

## 2015-03-27 ENCOUNTER — Emergency Department (HOSPITAL_COMMUNITY)
Admission: EM | Admit: 2015-03-27 | Discharge: 2015-03-27 | Disposition: A | Discharge status: Home / Self Care | Payer: Self-pay | Source: Home / Self Care | Attending: Family Medicine | Admitting: Family Medicine

## 2015-03-27 DIAGNOSIS — S6291XA Unspecified fracture of right wrist and hand, initial encounter for closed fracture: Secondary | ICD-10-CM

## 2015-03-27 NOTE — Discharge Instructions (Signed)
Thank you for coming in today. Follow up with the hand doctor on Wednesday May 4th at 10:30.  Call [phone] if you need to change the appointment.  Return as needed.  Use the splint.   Hand Fracture, Metacarpals Fractures of metacarpals are breaks in the bones of the hand. They extend from the knuckles to the wrist. These bones can undergo many types of fractures. There are different ways of treating these fractures, all of which may be correct. TREATMENT  Hand fractures can be treated with:   Non-reduction - The fracture is casted without changing the positions of the fracture (bone pieces) involved. This fracture is usually left in a cast for 4 to 6 weeks or as your caregiver thinks necessary.  Closed reduction - The bones are moved back into position without surgery and then casted.  ORIF (open reduction and internal fixation) - The fracture site is opened and the bone pieces are fixed into place with some type of hardware, such as screws, etc. They are then casted. Your caregiver will discuss the type of fracture you have and the treatment that should be best for that problem. If surgery is chosen, let your caregivers know about the following.  LET YOUR CAREGIVERS KNOW ABOUT:  Allergies.  Medications you are taking, including herbs, eye drops, over the counter medications, and creams.  Use of steroids (by mouth or creams).  Previous problems with anesthetics or novocaine.  Possibility of pregnancy.  History of blood clots (thrombophlebitis).  History of bleeding or blood problems.  Previous surgeries.  Other health problems. AFTER THE PROCEDURE After surgery, you will be taken to the recovery area where a nurse will watch and check your progress. Once you are awake, stable, and taking fluids well, barring other problems, you'll be allowed to go home. Once home, an ice pack applied to your operative site may help with pain and keep the swelling down. HOME CARE  INSTRUCTIONS   Follow your caregiver's instructions as to activities, exercises, physical therapy, and driving a car.  Daily exercise is helpful for keeping range of motion and strength. Exercise as instructed.  To lessen swelling, keep the injured hand elevated above the level of your heart as much as possible.  Apply ice to the injury for 15-20 minutes each hour while awake for the first 2 days. Put the ice in a plastic bag and place a thin towel between the bag of ice and your cast.  Move the fingers of your casted hand several times a day.  If a plaster or fiberglass cast was applied:  Do not try to scratch the skin under the cast using a sharp or pointed object.  Check the skin around the cast every day. You may put lotion on red or sore areas.  Keep your cast dry. Your cast can be protected during bathing with a plastic bag. Do not put your cast into the water.  If a plaster splint was applied:  Wear your splint for as long as directed by your caregiver or until seen again.  Do not get your splint wet. Protect it during bathing with a plastic bag.  You may loosen the elastic bandage around the splint if your fingers start to get numb, tingle, get cold or turn blue.  Do not put pressure on your cast or splint; this may cause it to break. Especially, do not lean plaster casts on hard surfaces for 24 hours after application.  Take medications as directed by your caregiver.  Only take over-the-counter or prescription medicines for pain, discomfort, or fever as directed by your caregiver.  Follow-up as provided by your caregiver. This is very important in order to avoid permanent injury or disability and chronic pain. SEEK MEDICAL CARE IF:   Increased bleeding (more than a small spot) from beneath your cast or splint if there is beneath the cast as with an open reduction.  Redness, swelling, or increasing pain in the wound or from beneath your cast or splint.  Pus coming  from wound or from beneath your cast or splint.  An unexplained oral temperature above 102 F (38.9 C) develops, or as your caregiver suggests.  A foul smell coming from the wound or dressing or from beneath your cast or splint.  You have a problem moving any of your fingers. SEEK IMMEDIATE MEDICAL CARE IF:   You develop a rash  You have difficulty breathing  You have any allergy problems If you do not have a window in your cast for observing the wound, a discharge or minor bleeding may show up as a stain on the outside of your cast. Report these findings to your caregiver. MAKE SURE YOU:   Understand these instructions.  Will watch your condition.  Will get help right away if you are not doing well or get worse. Document Released: 11/14/2005 Document Revised: 02/06/2012 Document Reviewed: 07/03/2008 [facility] Patient Information 2015 [place], [place]. This information is not intended to replace advice given to you by your health care provider. Make sure you discuss any questions you have with your health care provider.    Cast or Splint Care Casts and splints support injured limbs and keep bones from moving while they heal. It is important to care for your cast or splint at home.  HOME CARE INSTRUCTIONS  Keep the cast or splint uncovered during the drying period. It can take 24 to 48 hours to dry if it is made of plaster. A fiberglass cast will dry in less than 1 hour.  Do not rest the cast on anything harder than a pillow for the first 24 hours.  Do not put weight on your injured limb or apply pressure to the cast until your health care provider gives you permission.  Keep the cast or splint dry. Wet casts or splints can lose their shape and may not support the limb as well. A wet cast that has lost its shape can also create harmful pressure on your skin when it dries. Also, wet skin can become infected.  Cover the cast or splint with a plastic bag when bathing or when out in  the rain or snow. If the cast is on the trunk of the body, take sponge baths until the cast is removed.  If your cast does become wet, dry it with a towel or a blow dryer on the cool setting only.  Keep your cast or splint clean. Soiled casts may be wiped with a moistened cloth.  Do not place any hard or soft foreign objects under your cast or splint, such as cotton, toilet paper, lotion, or powder.  Do not try to scratch the skin under the cast with any object. The object could get stuck inside the cast. Also, scratching could lead to an infection. If itching is a problem, use a blow dryer on a cool setting to relieve discomfort.  Do not trim or cut your cast or remove padding from inside of it.  Exercise all joints next to the injury that  are not immobilized by the cast or splint. For example, if you have a long leg cast, exercise the hip joint and toes. If you have an arm cast or splint, exercise the shoulder, elbow, thumb, and fingers.  Elevate your injured arm or leg on 1 or 2 pillows for the first 1 to 3 days to decrease swelling and pain.It is best if you can comfortably elevate your cast so it is higher than your heart. SEEK MEDICAL CARE IF:   Your cast or splint cracks.  Your cast or splint is too tight or too loose.  You have unbearable itching inside the cast.  Your cast becomes wet or develops a soft spot or area.  You have a bad smell coming from inside your cast.  You get an object stuck under your cast.  Your skin around the cast becomes red or raw.  You have new pain or worsening pain after the cast has been applied. SEEK IMMEDIATE MEDICAL CARE IF:   You have fluid leaking through the cast.  You are unable to move your fingers or toes.  You have discolored (blue or white), cool, painful, or very swollen fingers or toes beyond the cast.  You have tingling or numbness around the injured area.  You have severe pain or pressure under the cast.  You have any  difficulty with your breathing or have shortness of breath.  You have chest pain. Document Released: 11/11/2000 Document Revised: 09/04/2013 Document Reviewed: 05/23/2013 [facility] Patient Information 2015 [place], [place]. This information is not intended to replace advice given to you by your health care provider. Make sure you discuss any questions you have with your health care provider.

## 2015-03-27 NOTE — ED Provider Notes (Signed)
Joseph Fitzgerald is a 44 y.o. male who presents to Urgent Care today for right hand pain and swelling. Patient fell onto his right hand 2 weeks ago. He's had pain and swelling since.  He notes the pain is worse with activity and better with rest. No radiating pain weakness or numbness fevers or chills. No treatment tried yet. He works as a Training and development officer.   Past Medical History  Diagnosis Date  . Asthma    Past Surgical History  Procedure Laterality Date  . No past surgeries     History  Substance Use Topics  . Smoking status: Never Smoker   . Smokeless tobacco: Current User    Types: Chew  . Alcohol Use: Yes   ROS as above Medications: No current facility-administered medications for this encounter.   Current Outpatient Prescriptions  Medication Sig Dispense Refill  . albuterol (PROVENTIL HFA;VENTOLIN HFA) 108 (90 BASE) MCG/ACT inhaler Inhale 2 puffs into the lungs every 4 (four) hours as needed for wheezing. 3 Inhaler 3  . cetirizine (ZYRTEC) 10 MG tablet Take 1 tablet (10 mg total) by mouth daily. 30 tablet 3  . fluticasone (FLOVENT HFA) 220 MCG/ACT inhaler Inhale 2 puffs into the lungs 2 (two) times daily. 3 Inhaler 3  . predniSONE (DELTASONE) 20 MG tablet 3 tabs po day one, then 2 tabs daily x 4 days 11 tablet 0  . Vitamin D, Ergocalciferol, (DRISDOL) 50000 UNITS CAPS capsule Take 1 capsule (50,000 Units total) by mouth every 7 (seven) days. 12 capsule 0   Allergies  Allergen Reactions  . Penicillins Rash     Exam:  BP 125/81 mmHg  Pulse 68  Temp(Src) 98.1 F (36.7 C) (Oral)  Resp 16  SpO2 100% Gen: Well NAD Right hand: Swollen and tender proximal radial metacarpals. Wrist is mildly tender with no significant swelling. Motion is intact pulses capillary refill and sensation are intact.  Patient was placed into a radial gutter splint  No results found for this or any previous visit (from the past 24 hour(s)). Dg Wrist Complete Right  03/27/2015   CLINICAL DATA:  Fall 2 weeks  ago. Hand and wrist swelling. Initial encounter.  EXAM: RIGHT WRIST - COMPLETE 3+ VIEW  COMPARISON:  None.  FINDINGS: There is a comminuted, intra-articular fracture of the base of the third metacarpal. A central fragment is mildly displaced radially. No dislocation is seen. No carpal fracture is identified. Diffuse hand soft tissue swelling is partially visualized. Soft tissue swelling extends into the wrist.  IMPRESSION: Comminuted fracture of the base of the third metacarpal.   Electronically Signed   By: Logan Bores   On: 03/27/2015 12:29   Dg Hand Complete Right  03/27/2015   CLINICAL DATA:  Fall 2 weeks ago. Hand and wrist swelling. Initial encounter.  EXAM: RIGHT HAND - COMPLETE 3+ VIEW  COMPARISON:  None.  FINDINGS: There is a comminuted, intra-articular fracture of the base of the third metacarpal. A central fragment is mildly displaced radially. There is diffuse surrounding soft tissue swelling. No dislocation is seen. No lytic or blastic osseous lesion or radiopaque foreign body.  IMPRESSION: Comminuted, mildly displaced fracture of the base of the third metacarpal.   Electronically Signed   By: Logan Bores   On: 03/27/2015 12:27    Assessment and Plan: 44 y.o. male with comminuted displaced proximal third metacarpal fracture. I scheduled patient an appointment with the on-call hand surgeon Dr. Grandville Silos for May 4. NSAIDs for pain as needed.  Discussed warning  signs or symptoms. Please see discharge instructions. Patient expresses understanding.     Gregor Hams, MD 03/27/15 1256

## 2015-03-27 NOTE — ED Notes (Signed)
States he fell 2 weeks ago and injured right hand

## 2015-04-13 ENCOUNTER — Encounter: Payer: Self-pay | Admitting: Internal Medicine

## 2015-04-13 ENCOUNTER — Ambulatory Visit: Payer: Self-pay | Attending: Internal Medicine | Admitting: Internal Medicine

## 2015-04-13 VITALS — BP 121/88 | HR 84 | Temp 98.0°F | Resp 16 | Wt 165.2 lb

## 2015-04-13 DIAGNOSIS — J4520 Mild intermittent asthma, uncomplicated: Secondary | ICD-10-CM | POA: Insufficient documentation

## 2015-04-13 DIAGNOSIS — Z7951 Long term (current) use of inhaled steroids: Secondary | ICD-10-CM | POA: Insufficient documentation

## 2015-04-13 DIAGNOSIS — IMO0001 Reserved for inherently not codable concepts without codable children: Secondary | ICD-10-CM

## 2015-04-13 MED ORDER — ALBUTEROL SULFATE HFA 108 (90 BASE) MCG/ACT IN AERS: 2.0000 | INHALATION_SPRAY | RESPIRATORY_TRACT | Status: DC | PRN

## 2015-04-13 NOTE — Progress Notes (Signed)
MRN: [id number] Name: Joseph Fitzgerald  Sex: male Age: 44 y.o. DOB: 01-Apr-1971  Allergies: Penicillins  Chief Complaint  Patient presents with  . Follow-up    HPI: Patient is 44 y.o. male who has history of asthma comes today for followup, as per patient he is using Flovent everyday and use albuterol as needed, patient denies smoking cigarettes, currently denies any asthma exacerbation symptoms, he is requesting refill on his medication, previous blood work reviewed with the patient.  Past Medical History  Diagnosis Date  . Asthma     Past Surgical History  Procedure Laterality Date  . No past surgeries        Medication List       This list is accurate as of: 04/13/15  3:22 PM.  Always use your most recent med list.               albuterol 108 (90 BASE) MCG/ACT inhaler  Commonly known as:  PROVENTIL HFA;VENTOLIN HFA  Inhale 2 puffs into the lungs every 4 (four) hours as needed for wheezing.     cetirizine 10 MG tablet  Commonly known as:  ZYRTEC  Take 1 tablet (10 mg total) by mouth daily.     fluticasone 220 MCG/ACT inhaler  Commonly known as:  FLOVENT HFA  Inhale 2 puffs into the lungs 2 (two) times daily.     Vitamin D (Ergocalciferol) 50000 UNITS Caps capsule  Commonly known as:  DRISDOL  Take 1 capsule (50,000 Units total) by mouth every 7 (seven) days.        Meds ordered this encounter  Medications  . albuterol (PROVENTIL HFA;VENTOLIN HFA) 108 (90 BASE) MCG/ACT inhaler    Sig: Inhale 2 puffs into the lungs every 4 (four) hours as needed for wheezing.    Dispense:  3 Inhaler    Refill:  3    Immunization History  Administered Date(s) Administered  . Influenza Split 09/03/2013  . Influenza,inj,Quad PF,36+ Mos 01/22/2015  . Pneumococcal Polysaccharide-23 06/13/2013    Family History  Problem Relation Age of Onset  . Asthma Brother     History  Substance Use Topics  . Smoking status: Never Smoker   . Smokeless tobacco: Current User   Types: Chew  . Alcohol Use: Yes    Review of Systems   As noted in HPI  Filed Vitals:   04/13/15 1432  BP: 121/88  Pulse: 84  Temp: 98 F (36.7 C)  Resp: 16    Physical Exam  Physical Exam  Constitutional: No distress.  Eyes: EOM are normal. Pupils are equal, round, and reactive to light.  Cardiovascular: Normal rate and regular rhythm.   Pulmonary/Chest: Breath sounds normal. No respiratory distress. He has no wheezes. He has no rales.  Musculoskeletal: He exhibits no edema.    CBC    Component Value Date/Time   WBC 6.2 08/18/2014 0920   RBC 5.11 08/18/2014 0920   HGB 16.6 08/18/2014 0920   HCT 47.0 08/18/2014 0920   PLT 246 08/18/2014 0920   MCV 92.0 08/18/2014 0920   LYMPHSABS 2.4 08/18/2014 0920   MONOABS 0.4 08/18/2014 0920   EOSABS 0.4 08/18/2014 0920   BASOSABS 0.1 08/18/2014 0920    CMP     Component Value Date/Time   NA 140 01/22/2015 1009   K 5.0 01/22/2015 1009   CL 104 01/22/2015 1009   CO2 27 01/22/2015 1009   GLUCOSE 96 01/22/2015 1009   BUN 17 01/22/2015 1009   CREATININE  0.97 01/22/2015 1009   CREATININE 1.07 06/12/2013 0054   CALCIUM 9.2 01/22/2015 1009   PROT 7.4 01/22/2015 1009   ALBUMIN 4.2 01/22/2015 1009   AST 26 01/22/2015 1009   ALT 20 01/22/2015 1009   ALKPHOS 104 01/22/2015 1009   BILITOT 0.3 01/22/2015 1009   GFRNONAA >89 01/22/2015 1009   GFRNONAA 86* 06/12/2013 0054   GFRAA >89 01/22/2015 1009   GFRAA >90 06/12/2013 0054    Lab Results  Component Value Date/Time   CHOL 220* 08/18/2014 09:20 AM    Lab Results  Component Value Date/Time   HGBA1C 5.4 01/22/2015 10:09 AM    Lab Results  Component Value Date/Time   AST 26 01/22/2015 10:09 AM    Assessment and Plan  Moderate intermittent asthma without complication - Plan: symptoms are stable continue with Flovent,albuterol (PROVENTIL HFA;VENTOLIN HFA) 108 (90 BASE) MCG/ACT inhaler when necessary   Health Maintenance  -Vaccinations:  Up-to-date with her  Pneumovax  Return in about 3 months (around 07/14/2015).   This note has been created with Surveyor, quantity. Any transcriptional errors are unintentional.    Lorayne Marek, MD

## 2015-04-13 NOTE — Progress Notes (Signed)
Patient states he is here for follow up on his asthma and in need of Medication refill

## 2015-08-31 ENCOUNTER — Encounter: Payer: Self-pay | Admitting: Family Medicine

## 2015-08-31 ENCOUNTER — Ambulatory Visit: Payer: Self-pay | Attending: Family Medicine | Admitting: Family Medicine

## 2015-08-31 VITALS — BP 121/82 | HR 64 | Temp 99.2°F | Resp 18 | Ht 64.0 in | Wt 163.6 lb

## 2015-08-31 DIAGNOSIS — Z91048 Other nonmedicinal substance allergy status: Secondary | ICD-10-CM | POA: Insufficient documentation

## 2015-08-31 DIAGNOSIS — Z9109 Other allergy status, other than to drugs and biological substances: Secondary | ICD-10-CM

## 2015-08-31 DIAGNOSIS — J45909 Unspecified asthma, uncomplicated: Secondary | ICD-10-CM | POA: Insufficient documentation

## 2015-08-31 DIAGNOSIS — E559 Vitamin D deficiency, unspecified: Secondary | ICD-10-CM

## 2015-08-31 DIAGNOSIS — Z114 Encounter for screening for human immunodeficiency virus [HIV]: Secondary | ICD-10-CM

## 2015-08-31 DIAGNOSIS — Z833 Family history of diabetes mellitus: Secondary | ICD-10-CM | POA: Insufficient documentation

## 2015-08-31 DIAGNOSIS — Z0000 Encounter for general adult medical examination without abnormal findings: Secondary | ICD-10-CM

## 2015-08-31 DIAGNOSIS — H00013 Hordeolum externum right eye, unspecified eyelid: Secondary | ICD-10-CM

## 2015-08-31 DIAGNOSIS — H00019 Hordeolum externum unspecified eye, unspecified eyelid: Secondary | ICD-10-CM | POA: Insufficient documentation

## 2015-08-31 DIAGNOSIS — J4520 Mild intermittent asthma, uncomplicated: Secondary | ICD-10-CM

## 2015-08-31 DIAGNOSIS — L309 Dermatitis, unspecified: Secondary | ICD-10-CM

## 2015-08-31 LAB — POCT GLYCOSYLATED HEMOGLOBIN (HGB A1C): HEMOGLOBIN A1C: 5.1

## 2015-08-31 LAB — HIV ANTIBODY (ROUTINE TESTING W REFLEX): HIV 1&2 Ab, 4th Generation: NONREACTIVE

## 2015-08-31 MED ORDER — ALBUTEROL SULFATE HFA 108 (90 BASE) MCG/ACT IN AERS: 2.0000 | INHALATION_SPRAY | RESPIRATORY_TRACT | Status: DC | PRN

## 2015-08-31 MED ORDER — CETIRIZINE HCL 10 MG PO TABS: 10.0000 mg | ORAL_TABLET | Freq: Every day | ORAL | Status: DC

## 2015-08-31 MED ORDER — FLUTICASONE PROPIONATE HFA 220 MCG/ACT IN AERO: 2.0000 | INHALATION_SPRAY | Freq: Two times a day (BID) | RESPIRATORY_TRACT | Status: DC

## 2015-08-31 MED ORDER — MUPIROCIN 2 % EX OINT: 1.0000 "application " | TOPICAL_OINTMENT | Freq: Three times a day (TID) | CUTANEOUS | Status: DC

## 2015-08-31 MED ORDER — TRIAMCINOLONE ACETONIDE 0.1 % EX CREA: 1.0000 "application " | TOPICAL_CREAM | Freq: Two times a day (BID) | CUTANEOUS | Status: DC

## 2015-08-31 NOTE — Progress Notes (Signed)
Patient ID: Joseph Fitzgerald, male   DOB: 05/11/1971, 44 y.o.   MRN: [id number]   Subjective:  Patient ID: Joseph Fitzgerald, male    DOB: [birth year]  Age: 44 y.o. MRN: [id number]  CC: Asthma  HPI Joseph Fitzgerald presents for asthma f/u  1. Asthma Follow-up The patient has previously been evaluated here for asthma and presents for an asthma follow-up. The patient is not currently have symptoms / an exacerbation. The patient has been having episodes for approximately 18 years. Symptoms in previous episodes have included dyspnea, non-productive cough and wheezing. Previous episodes have been triggered by change in climate . Treatments tried during prior episodes include inhaled corticosteroids and short-acting inhaled beta-adrenergic agonists, which usually provides complete resolution of symptoms.   Current Disease Severity The patient is having daytime symptoms none of the time. The patient is having daytime symptoms none of the time . The patient is using short-acting beta agonists for symptom control none of the time . He has exacerbations requiring oral systemic corticosteroids 0 times per year. Current limitations in activity from asthma: none. Number of days of school or work missed in the last month: 0. Number of urgent/emergent visit in last year: 0.  The patient is not using a spacer with MDIs.  2. fam hx of DM2: in brother. Patient would like to be screened. No vision change, polyuria or polydipsia.   3. Stye: R eye lid. Has hx of previous stye. Pruritic. Patient has scratched the area. Mildly painful.   Social History  Substance Use Topics  . Smoking status: Never Smoker   . Smokeless tobacco: Current User    Types: Chew  . Alcohol Use: Yes     Comment: beer   Outpatient Prescriptions Prior to Visit  Medication Sig Dispense Refill  . albuterol (PROVENTIL HFA;VENTOLIN HFA) 108 (90 BASE) MCG/ACT inhaler Inhale 2 puffs into the lungs every 4 (four) hours as needed for wheezing. 3 Inhaler 3  .  fluticasone (FLOVENT HFA) 220 MCG/ACT inhaler Inhale 2 puffs into the lungs 2 (two) times daily. 3 Inhaler 3  . cetirizine (ZYRTEC) 10 MG tablet Take 1 tablet (10 mg total) by mouth daily. (Patient not taking: Reported on 08/31/2015) 30 tablet 3  . Vitamin D, Ergocalciferol, (DRISDOL) 50000 UNITS CAPS capsule Take 1 capsule (50,000 Units total) by mouth every 7 (seven) days. (Patient not taking: Reported on 08/31/2015) 12 capsule 0   No facility-administered medications prior to visit.    ROS Review of Systems  Constitutional: Negative for fever, chills, fatigue and unexpected weight change.  Eyes: Positive for itching. Negative for visual disturbance.  Respiratory: Negative for cough and shortness of breath.   Cardiovascular: Negative for chest pain, palpitations and leg swelling.  Gastrointestinal: Negative for nausea, vomiting, abdominal pain, diarrhea, constipation and blood in stool.  Endocrine: Negative for polydipsia, polyphagia and polyuria.  Musculoskeletal: Negative for myalgias, back pain, arthralgias, gait problem and neck pain.  Skin: Negative for rash.  Allergic/Immunologic: Negative for immunocompromised state.  Hematological: Negative for adenopathy. Does not bruise/bleed easily.  Psychiatric/Behavioral: Negative for suicidal ideas, sleep disturbance and dysphoric mood. The patient is not nervous/anxious.     Objective:  BP 121/82 mmHg  Pulse 64  Temp(Src) 99.2 F (37.3 C) (Oral)  Resp 18  Ht 5' 4"  (1.626 m)  Wt 163 lb 9.6 oz (74.208 kg)  BMI 28.07 kg/m2  SpO2 99%  BP/Weight 08/31/2015 04/13/2015 05/04/3015  Systolic BP 010 932 355  Diastolic BP 82 88 81  Wt. (Lbs)  163.6 165.2 -  BMI 28.07 29.27 -    Physical Exam  Constitutional: He appears well-developed and well-nourished. No distress.  HENT:  Head: Normocephalic and atraumatic.  Eyes: Conjunctivae are normal. Pupils are equal, round, and reactive to light. Right eye exhibits hordeolum.    Neck: Normal  range of motion. Neck supple.  Cardiovascular: Normal rate, regular rhythm, normal heart sounds and intact distal pulses.   Pulmonary/Chest: Effort normal and breath sounds normal.  Musculoskeletal: He exhibits no edema.  Neurological: He is alert.  Skin: Skin is warm and dry. Rash noted. Rash is macular and maculopapular. No erythema.     Psychiatric: He has a normal mood and affect.     Assessment & Plan:   Problem List Items Addressed This Visit    Asthma, chronic - Primary (Chronic)   Relevant Medications   albuterol (PROVENTIL HFA;VENTOLIN HFA) 108 (90 BASE) MCG/ACT inhaler   cetirizine (ZYRTEC) 10 MG tablet   fluticasone (FLOVENT HFA) 220 MCG/ACT inhaler   Other Relevant Orders   Flu Vaccine QUAD 36+ mos PF IM (Fluarix & Fluzone Quad PF) (Completed)   Eczema (Chronic)   Relevant Medications   triamcinolone cream (KENALOG) 0.1 %   cetirizine (ZYRTEC) 10 MG tablet   Environmental allergies (Chronic)   Relevant Medications   cetirizine (ZYRTEC) 10 MG tablet   Family history of diabetes mellitus in brother (Chronic)   Relevant Orders   HgB A1c (Completed)   Stye   Relevant Medications   mupirocin ointment (BACTROBAN) 2 %    Other Visit Diagnoses    Screening for HIV (human immunodeficiency virus)        Relevant Orders    HIV antibody (with reflex)    Vitamin D deficiency        Relevant Orders    Vitamin D, 25-hydroxy    Health care maintenance           No orders of the defined types were placed in this encounter.    Follow-up: No Follow-up on file.   Boykin Nearing MD

## 2015-08-31 NOTE — Progress Notes (Signed)
Patient here to have check up for asthma, usually seen every 3 months for this by a different provider in this office (no longer here).  Patient states he feels "good" today.  Patient has no complaints of pain at this time.    Patient needs refill on Albuterol and Fluticasone (Flovent).  Patient finished taking Vitamin D, and is unsure if he needs to continue taking it.

## 2015-08-31 NOTE — Patient Instructions (Addendum)
Mr. Joseph Fitzgerald, Joseph Fitzgerald was seen today for asthma.  Diagnoses and all orders for this visit:  Asthma, chronic, mild intermittent, uncomplicated -     Flu Vaccine QUAD 36+ mos PF IM (Fluarix & Fluzone Quad PF) -     albuterol (PROVENTIL HFA;VENTOLIN HFA) 108 (90 BASE) MCG/ACT inhaler; Inhale 2 puffs into the lungs every 4 (four) hours as needed for wheezing. -     cetirizine (ZYRTEC) 10 MG tablet; Take 1 tablet (10 mg total) by mouth daily. -     fluticasone (FLOVENT HFA) 220 MCG/ACT inhaler; Inhale 2 puffs into the lungs 2 (two) times daily.  Family history of diabetes mellitus in brother -     HgB A1c  Eczema -     triamcinolone cream (KENALOG) 0.1 %; Apply 1 application topically 2 (two) times daily. -     cetirizine (ZYRTEC) 10 MG tablet; Take 1 tablet (10 mg total) by mouth daily.  Stye, right -     mupirocin ointment (BACTROBAN) 2 %; Apply 1 application topically 3 (three) times daily. To R lower eye lid  Screening for HIV (human immunodeficiency virus) -     HIV antibody (with reflex)  Environmental allergies -     cetirizine (ZYRTEC) 10 MG tablet; Take 1 tablet (10 mg total) by mouth daily.  Vitamin D deficiency -     Vitamin D, 25-hydroxy   F/u in 6 months for asthma f/u  Sty A sty (hordeolum) is an infection of a gland in the eyelid located at the base of the eyelash. A sty may develop a white or yellow head of pus. It can be puffy (swollen). Usually, the sty will burst and pus will come out on its own. They do not leave lumps in the eyelid once they drain. A sty is often confused with another form of cyst of the eyelid called a chalazion. Chalazions occur within the eyelid and not on the edge where the bases of the eyelashes are. They often are red, sore and then form firm lumps in the eyelid. CAUSES   Germs (bacteria).  Lasting (chronic) eyelid inflammation. SYMPTOMS   Tenderness, redness and swelling along the edge of the eyelid at the base of the  eyelashes.  Sometimes, there is a white or yellow head of pus. It may or may not drain. DIAGNOSIS  An ophthalmologist will be able to distinguish between a sty and a chalazion and treat the condition appropriately.  TREATMENT   Styes are typically treated with warm packs (compresses) until drainage occurs.  In rare cases, medicines that kill germs (antibiotics) may be prescribed. These antibiotics may be in the form of drops, cream or pills.  If a hard lump has formed, it is generally necessary to do a small incision and remove the hardened contents of the cyst in a minor surgical procedure done in the office.  In suspicious cases, your caregiver may send the contents of the cyst to the lab to be certain that it is not a rare, but dangerous form of cancer of the glands of the eyelid. HOME CARE INSTRUCTIONS   Wash your hands often and dry them with a clean towel. Avoid touching your eyelid. This may spread the infection to other parts of the eye.  Apply heat to your eyelid for 10 to 20 minutes, several times a day, to ease pain and help to heal it faster.  Do not squeeze the sty. Allow it to drain on its own. Wash your eyelid  carefully 3 to 4 times per day to remove any pus. SEEK IMMEDIATE MEDICAL CARE IF:   Your eye becomes painful or puffy (swollen).  Your vision changes.  Your sty does not drain by itself within 3 days.  Your sty comes back within a short period of time, even with treatment.  You have redness (inflammation) around the eye.  You have a fever. Document Released: 08/24/2005 Document Revised: 02/06/2012 Document Reviewed: 02/28/2014 [facility] Patient Information 2015 [place], [place]. This information is not intended to replace advice given to you by your health care provider. Make sure you discuss any questions you have with your health care provider.  Eczema Eczema, also called atopic dermatitis, is a skin disorder that causes inflammation of the skin. It causes a  red rash and dry, scaly skin. The skin becomes very itchy. Eczema is generally worse during the cooler winter months and often improves with the warmth of summer. Eczema usually starts showing signs in infancy. Some children outgrow eczema, but it may last through adulthood.  CAUSES  The exact cause of eczema is not known, but it appears to run in families. People with eczema often have a family history of eczema, allergies, asthma, or hay fever. Eczema is not contagious. Flare-ups of the condition may be caused by:   Contact with something you are sensitive or allergic to.   Stress. SIGNS AND SYMPTOMS  Dry, scaly skin.   Red, itchy rash.   Itchiness. This may occur before the skin rash and may be very intense.  DIAGNOSIS  The diagnosis of eczema is usually made based on symptoms and medical history. TREATMENT  Eczema cannot be cured, but symptoms usually can be controlled with treatment and other strategies. A treatment plan might include:  Controlling the itching and scratching.   Use over-the-counter antihistamines as directed for itching. This is especially useful at night when the itching tends to be worse.   Use over-the-counter steroid creams as directed for itching.   Avoid scratching. Scratching makes the rash and itching worse. It may also result in a skin infection (impetigo) due to a break in the skin caused by scratching.   Keeping the skin well moisturized with creams every day. This will seal in moisture and help prevent dryness. Lotions that contain alcohol and water should be avoided because they can dry the skin.   Limiting exposure to things that you are sensitive or allergic to (allergens).   Recognizing situations that cause stress.   Developing a plan to manage stress.  HOME CARE INSTRUCTIONS   Only take over-the-counter or prescription medicines as directed by your health care provider.   Do not use anything on the skin without checking with  your health care provider.   Keep baths or showers short (5 minutes) in warm (not hot) water. Use mild cleansers for bathing. These should be unscented. You may add nonperfumed bath oil to the bath water. It is best to avoid soap and bubble bath.   Immediately after a bath or shower, when the skin is still damp, apply a moisturizing ointment to the entire body. This ointment should be a petroleum ointment. This will seal in moisture and help prevent dryness. The thicker the ointment, the better. These should be unscented.   Keep fingernails cut short. Children with eczema may need to wear soft gloves or mittens at night after applying an ointment.   Dress in clothes made of cotton or cotton blends. Dress lightly, because heat increases itching.  A child with eczema should stay away from anyone with fever blisters or cold sores. The virus that causes fever blisters (herpes simplex) can cause a serious skin infection in children with eczema. SEEK MEDICAL CARE IF:   Your itching interferes with sleep.   Your rash gets worse or is not better within 1 week after starting treatment.   You see pus or soft yellow scabs in the rash area.   You have a fever.   You have a rash flare-up after contact with someone who has fever blisters.  Document Released: 11/11/2000 Document Revised: 09/04/2013 Document Reviewed: 06/17/2013 [facility] Patient Information 2015 [place], [place]. This information is not intended to replace advice given to you by your health care provider. Make sure you discuss any questions you have with your health care provider.

## 2015-09-01 DIAGNOSIS — E559 Vitamin D deficiency, unspecified: Secondary | ICD-10-CM | POA: Insufficient documentation

## 2015-09-01 LAB — VITAMIN D 25 HYDROXY (VIT D DEFICIENCY, FRACTURES): Vit D, 25-Hydroxy: 15 ng/mL — ABNORMAL LOW (ref 30–100)

## 2015-09-01 MED ORDER — VITAMIN D (ERGOCALCIFEROL) 1.25 MG (50000 UNIT) PO CAPS: 50000.0000 [IU] | ORAL_CAPSULE | ORAL | Status: DC

## 2015-09-01 NOTE — Addendum Note (Signed)
Addended by: Boykin Nearing on: 09/01/2015 09:14 AM   Modules accepted: Orders

## 2015-09-03 ENCOUNTER — Telehealth: Payer: Self-pay | Admitting: *Deleted

## 2015-09-03 NOTE — Telephone Encounter (Signed)
Left voice message with male to return call

## 2015-09-03 NOTE — Telephone Encounter (Signed)
-----   Message from Boykin Nearing, MD sent at 09/01/2015  9:13 AM EDT ----- Vit d deficiency, take 77 K U weekly x 12 week  Screening HIV negative

## 2015-09-07 ENCOUNTER — Ambulatory Visit: Payer: Self-pay

## 2016-08-14 ENCOUNTER — Emergency Department (HOSPITAL_COMMUNITY): Payer: Self-pay

## 2016-08-14 ENCOUNTER — Encounter (HOSPITAL_COMMUNITY): Payer: Self-pay | Admitting: Emergency Medicine

## 2016-08-14 ENCOUNTER — Inpatient Hospital Stay (HOSPITAL_COMMUNITY)
Admission: EM | Admit: 2016-08-14 | Discharge: 2016-08-15 | DRG: 202 | Disposition: A | Discharge status: Home / Self Care | Payer: Self-pay | Attending: Internal Medicine | Admitting: Internal Medicine

## 2016-08-14 DIAGNOSIS — J9601 Acute respiratory failure with hypoxia: Secondary | ICD-10-CM | POA: Diagnosis present

## 2016-08-14 DIAGNOSIS — Z79899 Other long term (current) drug therapy: Secondary | ICD-10-CM

## 2016-08-14 DIAGNOSIS — J209 Acute bronchitis, unspecified: Secondary | ICD-10-CM | POA: Diagnosis present

## 2016-08-14 DIAGNOSIS — E860 Dehydration: Secondary | ICD-10-CM | POA: Diagnosis present

## 2016-08-14 DIAGNOSIS — J4520 Mild intermittent asthma, uncomplicated: Secondary | ICD-10-CM

## 2016-08-14 DIAGNOSIS — J45909 Unspecified asthma, uncomplicated: Secondary | ICD-10-CM | POA: Diagnosis present

## 2016-08-14 DIAGNOSIS — Z833 Family history of diabetes mellitus: Secondary | ICD-10-CM

## 2016-08-14 DIAGNOSIS — B348 Other viral infections of unspecified site: Secondary | ICD-10-CM | POA: Diagnosis present

## 2016-08-14 DIAGNOSIS — F17220 Nicotine dependence, chewing tobacco, uncomplicated: Secondary | ICD-10-CM | POA: Diagnosis present

## 2016-08-14 DIAGNOSIS — J45901 Unspecified asthma with (acute) exacerbation: Principal | ICD-10-CM | POA: Diagnosis present

## 2016-08-14 DIAGNOSIS — R739 Hyperglycemia, unspecified: Secondary | ICD-10-CM | POA: Diagnosis present

## 2016-08-14 DIAGNOSIS — Z880 Allergy status to penicillin: Secondary | ICD-10-CM

## 2016-08-14 DIAGNOSIS — D72829 Elevated white blood cell count, unspecified: Secondary | ICD-10-CM | POA: Diagnosis present

## 2016-08-14 HISTORY — DX: Reserved for inherently not codable concepts without codable children: IMO0001

## 2016-08-14 LAB — CBC WITH DIFFERENTIAL/PLATELET
Basophils Absolute: 0.1 10*3/uL (ref 0.0–0.1)
Basophils Relative: 0 %
EOS ABS: 0.9 10*3/uL — AB (ref 0.0–0.7)
EOS PCT: 7 %
HCT: 52.7 % — ABNORMAL HIGH (ref 39.0–52.0)
Hemoglobin: 17.9 g/dL — ABNORMAL HIGH (ref 13.0–17.0)
LYMPHS ABS: 1.2 10*3/uL (ref 0.7–4.0)
Lymphocytes Relative: 9 %
MCH: 32.1 pg (ref 26.0–34.0)
MCHC: 34 g/dL (ref 30.0–36.0)
MCV: 94.4 fL (ref 78.0–100.0)
MONOS PCT: 6 %
Monocytes Absolute: 0.9 10*3/uL (ref 0.1–1.0)
Neutro Abs: 10.6 10*3/uL — ABNORMAL HIGH (ref 1.7–7.7)
Neutrophils Relative %: 78 %
PLATELETS: 227 10*3/uL (ref 150–400)
RBC: 5.58 MIL/uL (ref 4.22–5.81)
RDW: 12.4 % (ref 11.5–15.5)
WBC: 13.6 10*3/uL — AB (ref 4.0–10.5)

## 2016-08-14 LAB — BASIC METABOLIC PANEL
Anion gap: 9 (ref 5–15)
BUN: 10 mg/dL (ref 6–20)
CHLORIDE: 104 mmol/L (ref 101–111)
CO2: 27 mmol/L (ref 22–32)
CREATININE: 0.9 mg/dL (ref 0.61–1.24)
Calcium: 9 mg/dL (ref 8.9–10.3)
GFR calc Af Amer: >60 mL/min (ref >60)
GFR calc non Af Amer: >60 mL/min (ref >60)
Glucose, Bld: 161 mg/dL — ABNORMAL HIGH (ref 65–99)
Potassium: 3.5 mmol/L (ref 3.5–5.1)
SODIUM: 140 mmol/L (ref 135–145)

## 2016-08-14 LAB — MAGNESIUM: MAGNESIUM: 2.1 mg/dL (ref 1.7–2.4)

## 2016-08-14 MED ORDER — ENOXAPARIN SODIUM 40 MG/0.4ML ~~LOC~~ SOLN
40.0000 mg | SUBCUTANEOUS | Status: DC
  Administered 2016-08-14 – 2016-08-15 (×2): 40 mg via SUBCUTANEOUS
  Filled 2016-08-14 (×2): qty 0.4

## 2016-08-14 MED ORDER — METHYLPREDNISOLONE SODIUM SUCC 125 MG IJ SOLR
INTRAMUSCULAR | Status: AC
  Filled 2016-08-14: qty 2

## 2016-08-14 MED ORDER — METHYLPREDNISOLONE SODIUM SUCC 125 MG IJ SOLR
60.0000 mg | Freq: Two times a day (BID) | INTRAMUSCULAR | Status: DC
  Administered 2016-08-14 – 2016-08-15 (×2): 60 mg via INTRAVENOUS
  Filled 2016-08-14 (×2): qty 2

## 2016-08-14 MED ORDER — ACETAMINOPHEN 650 MG RE SUPP: 650.0000 mg | Freq: Four times a day (QID) | RECTAL | Status: DC | PRN

## 2016-08-14 MED ORDER — ACETAMINOPHEN 325 MG PO TABS: 650.0000 mg | ORAL_TABLET | Freq: Four times a day (QID) | ORAL | Status: DC | PRN

## 2016-08-14 MED ORDER — AZITHROMYCIN 500 MG IV SOLR
500.0000 mg | INTRAVENOUS | Status: DC
  Administered 2016-08-14 – 2016-08-15 (×2): 500 mg via INTRAVENOUS
  Filled 2016-08-14 (×2): qty 500

## 2016-08-14 MED ORDER — MONTELUKAST SODIUM 10 MG PO TABS
10.0000 mg | ORAL_TABLET | Freq: Every day | ORAL | Status: DC
  Administered 2016-08-14: 10 mg via ORAL
  Filled 2016-08-14: qty 1

## 2016-08-14 MED ORDER — IPRATROPIUM BROMIDE 0.02 % IN SOLN
RESPIRATORY_TRACT | Status: AC
  Filled 2016-08-14: qty 2.5

## 2016-08-14 MED ORDER — METHYLPREDNISOLONE SODIUM SUCC 125 MG IJ SOLR
60.0000 mg | Freq: Four times a day (QID) | INTRAMUSCULAR | Status: DC
  Administered 2016-08-14: 60 mg via INTRAVENOUS
  Filled 2016-08-14: qty 2

## 2016-08-14 MED ORDER — METHYLPREDNISOLONE SODIUM SUCC 125 MG IJ SOLR
125.0000 mg | Freq: Once | INTRAMUSCULAR | Status: AC
  Administered 2016-08-14: 125 mg via INTRAVENOUS

## 2016-08-14 MED ORDER — ALBUTEROL (5 MG/ML) CONTINUOUS INHALATION SOLN
15.0000 mg/h | INHALATION_SOLUTION | Freq: Once | RESPIRATORY_TRACT | Status: AC
  Administered 2016-08-14: 15 mg/h via RESPIRATORY_TRACT

## 2016-08-14 MED ORDER — IPRATROPIUM-ALBUTEROL 0.5-2.5 (3) MG/3ML IN SOLN
3.0000 mL | Freq: Once | RESPIRATORY_TRACT | Status: AC
  Administered 2016-08-14: 3 mL via RESPIRATORY_TRACT
  Filled 2016-08-14: qty 3

## 2016-08-14 MED ORDER — IPRATROPIUM-ALBUTEROL 0.5-2.5 (3) MG/3ML IN SOLN
3.0000 mL | Freq: Four times a day (QID) | RESPIRATORY_TRACT | Status: DC
  Administered 2016-08-14 – 2016-08-15 (×5): 3 mL via RESPIRATORY_TRACT
  Filled 2016-08-14 (×5): qty 3

## 2016-08-14 MED ORDER — IPRATROPIUM-ALBUTEROL 0.5-2.5 (3) MG/3ML IN SOLN: 3.0000 mL | RESPIRATORY_TRACT | Status: DC | PRN

## 2016-08-14 MED ORDER — IPRATROPIUM BROMIDE 0.02 % IN SOLN
0.5000 mg | Freq: Once | RESPIRATORY_TRACT | Status: AC
  Administered 2016-08-14: 0.5 mg via RESPIRATORY_TRACT

## 2016-08-14 MED ORDER — SODIUM CHLORIDE 0.9 % IV SOLN
INTRAVENOUS | Status: DC
  Administered 2016-08-14: 10:00:00 via INTRAVENOUS

## 2016-08-14 NOTE — ED Provider Notes (Signed)
[place] DEPT Provider Note   CSN: [id number] Arrival date & time: 08/14/16  0143  By signing my name below, I, Reola Mosher, attest that this documentation has been prepared under the direction and in the presence of Delora Fuel, MD. Electronically Signed: Reola Mosher, ED Scribe. 08/14/16. 2:17 AM.  History   Chief Complaint No chief complaint on file.  LEVEL 5 CAVEAT: HPI and ROS limited due to respiratory distress  The history is provided by the patient, a relative and medical records. History limited by: respiratory distress. No language interpreter was used.   HPI Comments: Joseph Fitzgerald is a 45 y.o. male with a PMHx of asthma, who presents to the Emergency Department complaining of gradual onset, gradually worsening, constant SOB onset ~4 days ago. Pt reports associated cough w/ white sputum and diaphoresis secondary to his SOB. He has been using a rescue inhaler at home with minimal relief of his symptoms. Per family member, pt has a hx of asthma with similar symptoms. Pt is a non-smoker. Denies fever, chills, chest pain, abdominal pain, or pain otherwise. Prior chart review shows that the pt was last admitted for this problem ~3 years ago.   Past Medical History:  Diagnosis Date  . Asthma    Patient Active Problem List   Diagnosis Date Noted  . Vitamin D deficiency 09/01/2015  . Family history of diabetes mellitus in brother 08/31/2015  . Eczema 08/31/2015  . Stye 08/31/2015  . Asthma, chronic 08/31/2015  . Environmental allergies 04/16/2014    Past Surgical History:  Procedure Laterality Date  . NO PAST SURGERIES      Home Medications    Prior to Admission medications   Medication Sig Start Date End Date Taking? Authorizing Provider  albuterol (PROVENTIL HFA;VENTOLIN HFA) 108 (90 BASE) MCG/ACT inhaler Inhale 2 puffs into the lungs every 4 (four) hours as needed for wheezing. 08/31/15   Josalyn Funches, MD  cetirizine (ZYRTEC) 10 MG tablet Take 1  tablet (10 mg total) by mouth daily. 08/31/15   Josalyn Funches, MD  fluticasone (FLOVENT HFA) 220 MCG/ACT inhaler Inhale 2 puffs into the lungs 2 (two) times daily. 08/31/15   Josalyn Funches, MD  mupirocin ointment (BACTROBAN) 2 % Apply 1 application topically 3 (three) times daily. To R lower eye lid 08/31/15   Josalyn Funches, MD  triamcinolone cream (KENALOG) 0.1 % Apply 1 application topically 2 (two) times daily. 08/31/15   Josalyn Funches, MD  Vitamin D, Ergocalciferol, (DRISDOL) 50000 UNITS CAPS capsule Take 1 capsule (50,000 Units total) by mouth every 7 (seven) days. For 12 weeks 09/01/15   Boykin Nearing, MD   Family History Family History  Problem Relation Age of Onset  . Asthma Brother   . Diabetes Brother    Social History Social History  Substance Use Topics  . Smoking status: Never Smoker  . Smokeless tobacco: Current User    Types: Chew  . Alcohol use Yes     Comment: beer   Allergies   Penicillins  Review of Systems Review of Systems  Unable to perform ROS: Severe respiratory distress  Constitutional: Positive for diaphoresis. Negative for chills and fever.  Respiratory: Positive for cough and shortness of breath.   Cardiovascular: Negative for chest pain.  Genitourinary: Negative.    Physical Exam Updated Vital Signs BP 132/96 (BP Location: Left Arm)   Pulse (!) 140   Temp 98.3 F (36.8 C) (Oral)   Resp 20   SpO2 (!) 82%   Physical Exam  Constitutional: He is oriented to person, place, and time. He appears well-developed and well-nourished.  HENT:  Head: Normocephalic and atraumatic.  Eyes: EOM are normal. Pupils are equal, round, and reactive to light.  Neck: Normal range of motion. Neck supple. No JVD present.  Cardiovascular: Regular rhythm and normal heart sounds.  Tachycardia present.   No murmur heard. Pulmonary/Chest: He is in respiratory distress. He exhibits no tenderness.  Markedly diminished air flow. Diffuse inspiratory and expiratory  wheezes. Able to speak in complete sentences. Slight use of accessory muscles of respiration.   Abdominal: Soft. Bowel sounds are normal. He exhibits no distension and no mass. There is no tenderness.  Musculoskeletal: Normal range of motion. He exhibits no edema.  Lymphadenopathy:    He has no cervical adenopathy.  Neurological: He is alert and oriented to person, place, and time. No cranial nerve deficit. He exhibits normal muscle tone. Coordination normal.  Skin: Skin is warm. No rash noted. He is diaphoretic.  Psychiatric: He has a normal mood and affect. His behavior is normal. Judgment and thought content normal.  Nursing note and vitals reviewed.  ED Treatments / Results  DIAGNOSTIC STUDIES: Oxygen Saturation is 92% on RA, low by my interpretation.   COORDINATION OF CARE: 2:13 AM-Discussed next steps with pt. Pt verbalized understanding and is agreeable with the plan.   Labs (all labs ordered are listed, but only abnormal results are displayed) Labs Reviewed  BASIC METABOLIC PANEL - Abnormal; Notable for the following:       Result Value   Glucose, Bld 161 (*)    All other components within normal limits  CBC WITH DIFFERENTIAL/PLATELET - Abnormal; Notable for the following:    WBC 13.6 (*)    Hemoglobin 17.9 (*)    HCT 52.7 (*)    Neutro Abs 10.6 (*)    Eosinophils Absolute 0.9 (*)    All other components within normal limits    Radiology Dg Chest Port 1 View  Result Date: 08/14/2016 CLINICAL DATA:  Acute onset of dyspnea.  Initial encounter. EXAM: PORTABLE CHEST 1 VIEW COMPARISON:  Chest radiograph performed 11/17/2013 FINDINGS: The lungs are well-aerated and clear. There is no evidence of focal opacification, pleural effusion or pneumothorax. The cardiomediastinal silhouette is within normal limits. No acute osseous abnormalities are seen. IMPRESSION: No acute cardiopulmonary process seen. Electronically Signed   By: Garald Balding M.D.   On: 08/14/2016 02:45     Procedures Procedures (including critical care time) CRITICAL CARE Performed by: XKGYJ,EHUDJ Total critical care time: 60 minutes Critical care time was exclusive of separately billable procedures and treating other patients. Critical care was necessary to treat or prevent imminent or life-threatening deterioration. Critical care was time spent personally by me on the following activities: development of treatment plan with patient and/or surrogate as well as nursing, discussions with consultants, evaluation of patient's response to treatment, examination of patient, obtaining history from patient or surrogate, ordering and performing treatments and interventions, ordering and review of laboratory studies, ordering and review of radiographic studies, pulse oximetry and re-evaluation of patient's condition.  Medications Ordered in ED Medications  ipratropium (ATROVENT) 0.02 % nebulizer solution (  Not Given 08/14/16 0220)  methylPREDNISolone sodium succinate (SOLU-MEDROL) 125 mg/2 mL injection (  Canceled Entry 08/14/16 0304)  albuterol (PROVENTIL,VENTOLIN) solution continuous neb (15 mg/hr Nebulization Given 08/14/16 0220)  ipratropium (ATROVENT) nebulizer solution 0.5 mg (0.5 mg Nebulization Given 08/14/16 0220)  methylPREDNISolone sodium succinate (SOLU-MEDROL) 125 mg/2 mL injection 125 mg (125 mg  Intravenous Given 08/14/16 0221)  ipratropium-albuterol (DUONEB) 0.5-2.5 (3) MG/3ML nebulizer solution 3 mL (3 mLs Nebulization Given 08/14/16 0507)     Initial Impression / Assessment and Plan / ED Course  I have reviewed the triage vital signs and the nursing notes.  Pertinent labs & imaging results that were available during my care of the patient were reviewed by me and considered in my medical decision making (see chart for details).  Clinical Course   Asthma exacerbation. Chest x-ray will be obtained to rule out pneumonia. Old records are reviewed, and his last hospitalization for asthma  was in 2014, no ED visits since then. He is started on a continuous albuterol and ipratropium nebulizer treatment and given methylprednisolone.  2:42 AM Nebulizer treatment is in process. He is no longer using accessory muscles of respiration. Air flow has improved significantly, but there is still moderate to severe wheezing throughout both lung fields. Oxygen saturation is increased to 100%.  3:29 AM Nebulizer treatment is continuing. Wheezing has diminished but is still present.  4:29 AM Continues with moderate wheezing and prolonged exhalation phase. Not using accessory muscles, but breathing still slightly labored. He will be given an additional albuterol with ipratropium nebulizer treatment.  0:600 AM Following above-noted treatment, there was little change. Decision was made to admit the patient. Case is discussed with Dr. Loleta Books of [facility] agrees to admit the patient under observation status.  Final Clinical Impressions(s) / ED Diagnoses   Final diagnoses:  Asthma exacerbation    New Prescriptions New Prescriptions   No medications on file   I personally performed the services described in this documentation, which was scribed in my presence. The recorded information has been reviewed and is accurate.      Delora Fuel, MD 28/20/81 [id number]

## 2016-08-14 NOTE — H&P (Signed)
History and Physical    Awesome [name]:[id number] DOB: [birth year] DOA: 08/14/2016   PCP: Minerva Ends, MD   Patient coming from/Resides with: Private residence/lives with wife  Admission status: Observation/medical floor  Chief Complaint: Shortness of breath  HPI: Joseph Fitzgerald is a 45 y.o. AM PMHx chronic asthma several prior hospitalizations over the past 3 years. Patient reports that this past Thursday he began having difficulty breathing, wheezing, cough and shortness of breath with rhinorrhea. No fevers. No nausea, vomiting or diarrhea. Productive cough w/ clear sputum. According to the triage notes patient's room-air saturation was 72% at presentation. States usually only requires rescue inhaler 1 time per week, however at the start of this month was requiring multiple uses daily.    ED Course:  Vital Signs: BP 117/71   Pulse 105   Temp 98.3 F (36.8 C) (Oral)   Resp 17   Ht 5' 3"  (1.6 m)   Wt 79.4 kg (175 lb)   SpO2 93%   BMI 31.00 kg/m  PCXR: No acute cardio primary process Lab data: Sodium 140, potassium 3.5, BUN 10, creatinine 0.9, glucose 161, anion gap 9, WBC 13,600, neutrophils 70%, absolute neutrophils 10.6%, hemoglobin 17.9, platelets 227,000 Medications and treatments: Continuous Proventil neb 50 mg/h, Atrovent nebs 0.5 mg 1, Solu-Medrol 125 mg IV 1, DuoNeb 1  Review of Systems:  In addition to the HPI above,  No Fever-chills, myalgias or other constitutional symptoms No Headache, changes with Vision or hearing, new weakness, tingling, numbness in any extremity, No problems swallowing food or Liquids, indigestion/reflux No Chest pain, palpitations, orthopnea or DOE No Abdominal pain, N/V; no melena or hematochezia, no dark tarry stools No dysuria, hematuria or flank pain No new skin rashes, lesions, masses or bruises, No new joints pains-aches No recent weight gain or loss No polyuria, polydypsia or polyphagia,   Past Medical History:  Diagnosis  Date  . Asthma     Past Surgical History:  Procedure Laterality Date  . NO PAST SURGERIES      Social History   Social History  . Marital status: Single    Spouse name: N/A  . Number of children: 3  . Years of education: 12   Occupational History  . [employer]   Social History Main Topics  . Smoking status: Never Smoker  . Smokeless tobacco: Current User    Types: Chew  . Alcohol use Yes     Comment: beer  . Drug use: No  . Sexual activity: Yes   Other Topics Concern  . Not on file   Social History Narrative  . No narrative on file    Mobility: Without assistive devices Work history: Works as Biomedical scientist at [employer]  . Penicillins Rash    Family History  Problem Relation Age of Onset  . Asthma Brother   . Diabetes Brother      Prior to Admission medications   Medication Sig Start Date End Date Taking? Authorizing Provider  albuterol (PROVENTIL HFA;VENTOLIN HFA) 108 (90 BASE) MCG/ACT inhaler Inhale 2 puffs into the lungs every 4 (four) hours as needed for wheezing. 08/31/15  Yes Boykin Nearing, MD    Physical Exam: Vitals:   08/14/16 0600 08/14/16 0645 08/14/16 0700 08/14/16 0715  BP: 109/71 100/64 116/70 117/71  Pulse: 110 109 112 105  Resp: 18 20 17 17   Temp:      TempSrc:      SpO2: 93% 91% 92% 93%  Weight:      Height:          Constitutional: NAD, calm, comfortable-sleepy Eyes: PERRL, lids and conjunctivae normal ENMT: Mucous membranes are moist. Posterior pharynx clear of any exudate or lesions.Normal dentition.  Neck: normal, supple, no masses, no thyromegaly Respiratory: Bilateral scattered inspiratory and expiratory wheezes but with good air movement, no crackles. Normal respiratory effort. No accessory muscle use. RA at rest  Cardiovascular: Regular slightly tachycardic rate and rhythm, no murmurs / rubs / gallops. No extremity edema. 2+ pedal pulses. No carotid bruits.  Abdomen: no tenderness, no  masses palpated. No hepatosplenomegaly. Bowel sounds positive.  Musculoskeletal: no clubbing / cyanosis. No joint deformity upper and lower extremities. Good ROM, no contractures. Normal muscle tone.  Skin: no rashes, lesions, ulcers. No induration Neurologic: CN 2-12 grossly intact. Sensation intact, DTR normal. Strength 5/5 x all 4 extremities.  Psychiatric: Normal judgment and insight. Alert and oriented x 3. Normal mood.    Labs on Admission: I have personally reviewed following labs and imaging studies  CBC:  Recent Labs Lab 08/14/16 0215  WBC 13.6*  NEUTROABS 10.6*  HGB 17.9*  HCT 52.7*  MCV 94.4  PLT 371   Basic Metabolic Panel:  Recent Labs Lab 08/14/16 0215  NA 140  K 3.5  CL 104  CO2 27  GLUCOSE 161*  BUN 10  CREATININE 0.90  CALCIUM 9.0   GFR: Estimated Creatinine Clearance: 99.7 mL/min (by C-G formula based on SCr of 0.9 mg/dL). Liver Function Tests: No results for input(s): AST, ALT, ALKPHOS, BILITOT, PROT, ALBUMIN in the last 168 hours. No results for input(s): LIPASE, AMYLASE in the last 168 hours. No results for input(s): AMMONIA in the last 168 hours. Coagulation Profile: No results for input(s): INR, PROTIME in the last 168 hours. Cardiac Enzymes: No results for input(s): CKTOTAL, CKMB, CKMBINDEX, TROPONINI in the last 168 hours. BNP (last 3 results) No results for input(s): PROBNP in the last 8760 hours. HbA1C: No results for input(s): HGBA1C in the last 72 hours. CBG: No results for input(s): GLUCAP in the last 168 hours. Lipid Profile: No results for input(s): CHOL, HDL, LDLCALC, TRIG, CHOLHDL, LDLDIRECT in the last 72 hours. Thyroid Function Tests: No results for input(s): TSH, T4TOTAL, FREET4, T3FREE, THYROIDAB in the last 72 hours. Anemia Panel: No results for input(s): VITAMINB12, FOLATE, FERRITIN, TIBC, IRON, RETICCTPCT in the last 72 hours. Urine analysis: No results found for: COLORURINE, APPEARANCEUR, LABSPEC, PHURINE, GLUCOSEU,  HGBUR, BILIRUBINUR, KETONESUR, PROTEINUR, UROBILINOGEN, NITRITE, LEUKOCYTESUR Sepsis Labs: @LABRCNTIP (procalcitonin:4,lacticidven:4) )No results found for this or any previous visit (from the past 240 hour(s)).   Radiological Exams on Admission: Dg Chest Port 1 View  Result Date: 08/14/2016 CLINICAL DATA:  Acute onset of dyspnea.  Initial encounter. EXAM: PORTABLE CHEST 1 VIEW COMPARISON:  Chest radiograph performed 11/17/2013 FINDINGS: The lungs are well-aerated and clear. There is no evidence of focal opacification, pleural effusion or pneumothorax. The cardiomediastinal silhouette is within normal limits. No acute osseous abnormalities are seen. IMPRESSION: No acute cardiopulmonary process seen. Electronically Signed   By: Garald Balding M.D.   On: 08/14/2016 02:45    EKG: (Independently reviewed) sinus tachycardia with ventricular rate of 133 bpm, QTC 437 ms, right axis patient, no  Assessment/Plan Principal Problem:  Acute respiratory failure with hypoxia / Asthma exacerbation w/ acute bronchitis -Patient presents with asthma exacerbation preceded by upper respiratory infection symptoms that appears to be consistent with a viral syndrome although now associated with leukocytosis and hypoxemia -No focal  infiltrate on chest x-ray but given leukocytosis will begin Zithromax to cover for bronchitis -Respiratory viral panel -Peak flow pre-and post neb treatment -Supportive care with oxygen as needed -Continue scheduled as well as prn duo nebs -Begin Singulair; patient not on preventative medications prior to admission and likely would benefit from LABA + inhaled steroid -Solu-Medrol 60 mg BID -Does not have PCP so case management consulted  Active Problems:   Leukocytosis -No fever but does have leukocytosis noting CBC was checked prior to administration of steroids -Antibiotics as above    Dehydration, mild -NS at 50 mL per hour    Family history of diabetes mellitus in  brother -Patient presents with hyperglycemia CBG 161 prior to administration of steroids -Given family history the patient age will check hemoglobin A1c      DVT prophylaxis: Lovenox Code Status: Full  Family Communication: Wife at bedside  Disposition Plan: Anticipate discharge back to preadmission home environment once medically stable Consults called: None    ELLIS,ALLISON L. ANP-BC [facility] Pager [phone]   If 7PM-7AM, please contact night-coverage [email or web address] Password [facility]  08/14/2016, 7:36 AM  Care during the described time interval was provided by ANP Erin Hearing and myself .  I have reviewed this patient's available data, including medical history, events of note, physical examination, and all test results as part of my evaluation. I have personally reviewed and interpreted all radiology studies.

## 2016-08-14 NOTE — Care Management (Signed)
Patient is an established patient at the [facility], f/u appointment scheduled at the [facility] Wednesday 9/20 at 11am.

## 2016-08-14 NOTE — ED Triage Notes (Signed)
Pt arrive to ED with family member with respiratory distress, pt states he started on Thursday night with running nose and congested cough. Pt 72% RA on arrival.

## 2016-08-14 NOTE — ED Notes (Signed)
Report attempted, RN to call back. 

## 2016-08-15 ENCOUNTER — Encounter: Payer: Self-pay | Admitting: Surgery

## 2016-08-15 DIAGNOSIS — J9601 Acute respiratory failure with hypoxia: Secondary | ICD-10-CM

## 2016-08-15 DIAGNOSIS — J45901 Unspecified asthma with (acute) exacerbation: Principal | ICD-10-CM

## 2016-08-15 DIAGNOSIS — E860 Dehydration: Secondary | ICD-10-CM

## 2016-08-15 DIAGNOSIS — J4520 Mild intermittent asthma, uncomplicated: Secondary | ICD-10-CM

## 2016-08-15 LAB — RESPIRATORY PANEL BY PCR
ADENOVIRUS-RVPPCR: NOT DETECTED
BORDETELLA PERTUSSIS-RVPCR: NOT DETECTED
CORONAVIRUS HKU1-RVPPCR: NOT DETECTED
CORONAVIRUS NL63-RVPPCR: NOT DETECTED
Chlamydophila pneumoniae: NOT DETECTED
Coronavirus 229E: NOT DETECTED
Coronavirus OC43: NOT DETECTED
Influenza A: NOT DETECTED
Influenza B: NOT DETECTED
METAPNEUMOVIRUS-RVPPCR: NOT DETECTED
Mycoplasma pneumoniae: NOT DETECTED
PARAINFLUENZA VIRUS 2-RVPPCR: NOT DETECTED
PARAINFLUENZA VIRUS 3-RVPPCR: NOT DETECTED
Parainfluenza Virus 1: NOT DETECTED
Parainfluenza Virus 4: NOT DETECTED
RHINOVIRUS / ENTEROVIRUS - RVPPCR: DETECTED — AB
Respiratory Syncytial Virus: NOT DETECTED

## 2016-08-15 LAB — CBC
HCT: 43.6 % (ref 39.0–52.0)
Hemoglobin: 14.5 g/dL (ref 13.0–17.0)
MCH: 31.5 pg (ref 26.0–34.0)
MCHC: 33.3 g/dL (ref 30.0–36.0)
MCV: 94.6 fL (ref 78.0–100.0)
PLATELETS: 213 10*3/uL (ref 150–400)
RBC: 4.61 MIL/uL (ref 4.22–5.81)
RDW: 12.7 % (ref 11.5–15.5)
WBC: 9.1 10*3/uL (ref 4.0–10.5)

## 2016-08-15 LAB — HEPATIC FUNCTION PANEL
ALBUMIN: 3.2 g/dL — AB (ref 3.5–5.0)
ALT: 13 U/L — ABNORMAL LOW (ref 17–63)
AST: 20 U/L (ref 15–41)
Alkaline Phosphatase: 101 U/L (ref 38–126)
TOTAL PROTEIN: 6.6 g/dL (ref 6.5–8.1)
Total Bilirubin: 0.2 mg/dL — ABNORMAL LOW (ref 0.3–1.2)

## 2016-08-15 LAB — BASIC METABOLIC PANEL
ANION GAP: 5 (ref 5–15)
BUN: 13 mg/dL (ref 6–20)
CALCIUM: 8.7 mg/dL — AB (ref 8.9–10.3)
CO2: 25 mmol/L (ref 22–32)
Chloride: 108 mmol/L (ref 101–111)
Creatinine, Ser: 0.77 mg/dL (ref 0.61–1.24)
Glucose, Bld: 135 mg/dL — ABNORMAL HIGH (ref 65–99)
POTASSIUM: 4.3 mmol/L (ref 3.5–5.1)
Sodium: 138 mmol/L (ref 135–145)

## 2016-08-15 LAB — HEMOGLOBIN A1C
Hgb A1c MFr Bld: 5.4 % (ref 4.8–5.6)
MEAN PLASMA GLUCOSE: 108 mg/dL

## 2016-08-15 MED ORDER — AZITHROMYCIN 500 MG PO TABS: 500.0000 mg | ORAL_TABLET | Freq: Every day | ORAL | 0 refills | Status: AC

## 2016-08-15 MED ORDER — MONTELUKAST SODIUM 10 MG PO TABS
10.0000 mg | ORAL_TABLET | Freq: Every day | ORAL | 1 refills | Status: DC
Start: 2016-08-15 — End: 2016-10-28

## 2016-08-15 MED ORDER — ALBUTEROL SULFATE HFA 108 (90 BASE) MCG/ACT IN AERS: 2.0000 | INHALATION_SPRAY | RESPIRATORY_TRACT | 3 refills | Status: DC | PRN

## 2016-08-15 MED ORDER — PREDNISONE 50 MG PO TABS: ORAL_TABLET | ORAL | 0 refills | Status: DC

## 2016-08-15 MED ORDER — BUDESONIDE-FORMOTEROL FUMARATE 160-4.5 MCG/ACT IN AERO: 2.0000 | INHALATION_SPRAY | Freq: Two times a day (BID) | RESPIRATORY_TRACT | 12 refills | Status: DC

## 2016-08-15 MED FILL — AZITHROMYCIN 500 MG TABLET: 500 | 5 days supply | Qty: 5 | Fill #0

## 2016-08-15 MED FILL — SYMBICORT 160-4.5 MCG INH: 160-4.5 | 20 days supply | Qty: 10 | Fill #0

## 2016-08-15 MED FILL — VENTOLIN HFA 90 MCG INHALER: 108 (90 BAS | 20 days supply | Qty: 18 | Fill #0

## 2016-08-15 MED FILL — ?PREDNISONE 10 MG TABLET: 10 | 5 days supply | Qty: 25 | Fill #0

## 2016-08-15 MED FILL — MONTELUKAST SOD 10 MG TAB: 10 | 30 days supply | Qty: 30 | Fill #0

## 2016-08-15 NOTE — Care Management Note (Signed)
Case Management Note  Patient Details  Name: Joseph Fitzgerald MRN: [id number] Date of Birth: February 21, 1971  Subjective/Objective:        CM following for progression and d/c planning.            Action/Plan: 08/15/2016 Met with pt who is established pt with [facility] [place]. MD has faxed prescriptions to that facility and pt states that he is familiar with the center to get medications. Also noted pt has an appointment scheduled for 9/20/;2017  Expected Discharge Date:  08/19/16               Expected Discharge Plan:  Home/Self Care  In-House Referral:  NA  Discharge planning Services  CM Consult  Post Acute Care Choice:  NA Choice offered to:  NA  DME Arranged:   NA DME Agency:   NA  HH Arranged:   NA HH Agency:   NA  Status of Service:  Completed, signed off  If discussed at [place] of Stay Meetings, dates discussed:    Additional Comments:  Adron Bene, RN 08/15/2016, 11:56 AM

## 2016-08-15 NOTE — Discharge Summary (Addendum)
Physician Discharge Summary  Joseph Fitzgerald MRN: [id number] DOB/AGE: [birth year] 45 y.o.  PCP: Minerva Ends, MD   Admit date: 08/14/2016 Discharge date: 08/15/2016  Discharge Diagnoses:    Principal Problem:   Asthma exacerbation Active Problems:   Acute respiratory failure with hypoxia (HCC)   Family history of diabetes mellitus in brother   Leukocytosis   Dehydration, mild   Asthmatic bronchitis    Follow-up recommendations Follow-up with PCP in 3-5 days , including all  additional recommended appointments as below Follow-up CBC, CMP in 3-5 days Respiratory virus panel was positive for Rhinovirus / Enterovirus     Current Discharge Medication List    START taking these medications   Details  azithromycin (ZITHROMAX) 500 MG tablet Take 1 tablet (500 mg total) by mouth daily. Qty: 5 tablet, Refills: 0    budesonide-formoterol (SYMBICORT) 160-4.5 MCG/ACT inhaler Inhale 2 puffs into the lungs 2 (two) times daily. Qty: 1 Inhaler, Refills: 12    montelukast (SINGULAIR) 10 MG tablet Take 1 tablet (10 mg total) by mouth at bedtime. Qty: 30 tablet, Refills: 1    predniSONE (DELTASONE) 50 MG tablet 50 mg daily Qty: 5 tablet, Refills: 0      CONTINUE these medications which have CHANGED   Details  albuterol (PROVENTIL HFA;VENTOLIN HFA) 108 (90 Base) MCG/ACT inhaler Inhale 2 puffs into the lungs every 4 (four) hours as needed for wheezing. Qty: 3 Inhaler, Refills: 3   Associated Diagnoses: Asthma, chronic, mild intermittent, uncomplicated           Discharge Condition: Stable Discharge Instructions Get Medicines reviewed and adjusted: Please take all your medications with you for your next visit with your Primary MD  Please request your Primary MD to go over all hospital tests and procedure/radiological results at the follow up, please ask your Primary MD to get all Hospital records sent to his/her office.  If you experience worsening of your admission  symptoms, develop shortness of breath, life threatening emergency, suicidal or homicidal thoughts you must seek medical attention immediately by calling 911 or calling your MD immediately if symptoms less severe.  You must read complete instructions/literature along with all the possible adverse reactions/side effects for all the Medicines you take and that have been prescribed to you. Take any new Medicines after you have completely understood and accpet all the possible adverse reactions/side effects.   Do not drive when taking Pain medications.   Do not take more than prescribed Pain, Sleep and Anxiety Medications  Special Instructions: If you have smoked or chewed Tobacco in the last 2 yrs please stop smoking, stop any regular Alcohol and or any Recreational drug use.  Wear Seat belts while driving.  Please note  You were cared for by a hospitalist during your hospital stay. Once you are discharged, your primary care physician will handle any further medical issues. Please note that NO REFILLS for any discharge medications will be authorized once you are discharged, as it is imperative that you return to your primary care physician (or establish a relationship with a primary care physician if you do not have one) for your aftercare needs so that they can reassess your need for medications and monitor your lab values.     Allergies  Allergen Reactions  . Penicillins Rash      Disposition: 01-Home or Self Care   Consults:  None     Significant Diagnostic Studies:  Dg Chest Port 1 View  Result Date: 08/14/2016 CLINICAL DATA:  Acute  onset of dyspnea.  Initial encounter. EXAM: PORTABLE CHEST 1 VIEW COMPARISON:  Chest radiograph performed 11/17/2013 FINDINGS: The lungs are well-aerated and clear. There is no evidence of focal opacification, pleural effusion or pneumothorax. The cardiomediastinal silhouette is within normal limits. No acute osseous abnormalities are seen.  IMPRESSION: No acute cardiopulmonary process seen. Electronically Signed   By: Garald Balding M.D.   On: 08/14/2016 02:45        Filed Weights   08/14/16 0229 08/14/16 0853 08/14/16 2151  Weight: 79.4 kg (175 lb) 69.5 kg (153 lb 3.2 oz) 70.8 kg (156 lb)     Labs: Results for orders placed or performed during the hospital encounter of 08/14/16 (from the past 48 hour(s))  Basic metabolic panel     Status: Abnormal   Collection Time: 08/14/16  2:15 AM  Result Value Ref Range   Sodium 140 135 - 145 mmol/L   Potassium 3.5 3.5 - 5.1 mmol/L   Chloride 104 101 - 111 mmol/L   CO2 27 22 - 32 mmol/L   Glucose, Bld 161 (H) 65 - 99 mg/dL   BUN 10 6 - 20 mg/dL   Creatinine, Ser 0.90 0.61 - 1.24 mg/dL   Calcium 9.0 8.9 - 10.3 mg/dL   GFR calc non Af Amer >60 >60 mL/min   GFR calc Af Amer >60 >60 mL/min    Comment: (NOTE) The eGFR has been calculated using the CKD EPI equation. This calculation has not been validated in all clinical situations. eGFR's persistently <60 mL/min signify possible Chronic Kidney Disease.    Anion gap 9 5 - 15  CBC with Differential     Status: Abnormal   Collection Time: 08/14/16  2:15 AM  Result Value Ref Range   WBC 13.6 (H) 4.0 - 10.5 K/uL   RBC 5.58 4.22 - 5.81 MIL/uL   Hemoglobin 17.9 (H) 13.0 - 17.0 g/dL   HCT 52.7 (H) 39.0 - 52.0 %   MCV 94.4 78.0 - 100.0 fL   MCH 32.1 26.0 - 34.0 pg   MCHC 34.0 30.0 - 36.0 g/dL   RDW 12.4 11.5 - 15.5 %   Platelets 227 150 - 400 K/uL   Neutrophils Relative % 78 %   Neutro Abs 10.6 (H) 1.7 - 7.7 K/uL   Lymphocytes Relative 9 %   Lymphs Abs 1.2 0.7 - 4.0 K/uL   Monocytes Relative 6 %   Monocytes Absolute 0.9 0.1 - 1.0 K/uL   Eosinophils Relative 7 %   Eosinophils Absolute 0.9 (H) 0.0 - 0.7 K/uL   Basophils Relative 0 %   Basophils Absolute 0.1 0.0 - 0.1 K/uL  Magnesium     Status: None   Collection Time: 08/14/16 11:14 AM  Result Value Ref Range   Magnesium 2.1 1.7 - 2.4 mg/dL  CBC     Status: None    Collection Time: 08/15/16  4:19 AM  Result Value Ref Range   WBC 9.1 4.0 - 10.5 K/uL   RBC 4.61 4.22 - 5.81 MIL/uL   Hemoglobin 14.5 13.0 - 17.0 g/dL   HCT 43.6 39.0 - 52.0 %   MCV 94.6 78.0 - 100.0 fL   MCH 31.5 26.0 - 34.0 pg   MCHC 33.3 30.0 - 36.0 g/dL   RDW 12.7 11.5 - 15.5 %   Platelets 213 150 - 400 K/uL  Basic metabolic panel     Status: Abnormal   Collection Time: 08/15/16  4:19 AM  Result Value Ref Range   Sodium  138 135 - 145 mmol/L   Potassium 4.3 3.5 - 5.1 mmol/L    Comment: DELTA CHECK NOTED   Chloride 108 101 - 111 mmol/L   CO2 25 22 - 32 mmol/L   Glucose, Bld 135 (H) 65 - 99 mg/dL   BUN 13 6 - 20 mg/dL   Creatinine, Ser 0.77 0.61 - 1.24 mg/dL   Calcium 8.7 (L) 8.9 - 10.3 mg/dL   GFR calc non Af Amer >60 >60 mL/min   GFR calc Af Amer >60 >60 mL/min    Comment: (NOTE) The eGFR has been calculated using the CKD EPI equation. This calculation has not been validated in all clinical situations. eGFR's persistently <60 mL/min signify possible Chronic Kidney Disease.    Anion gap 5 5 - 15     Lipid Panel     Component Value Date/Time   CHOL 220 (H) 08/18/2014 0920   TRIG 92 08/18/2014 0920   HDL 57 08/18/2014 0920   CHOLHDL 3.9 08/18/2014 0920   VLDL 18 08/18/2014 0920   LDLCALC 145 (H) 08/18/2014 0920     Lab Results  Component Value Date   HGBA1C 5.10 08/31/2015   HGBA1C 5.4 01/22/2015        HPI :  45 y.o. AM PMHx chronic asthma several prior hospitalizations over the past 3 years. Patient reports that this past Thursday he began having difficulty breathing, wheezing, cough and shortness of breath with rhinorrhea. No fevers. No nausea, vomiting or diarrhea. Productive cough w/ clear sputum. According to the triage notes patient's room-air saturation was 72% at presentation. States usually only requires rescue inhaler 1 time per week, however at the start of this month was requiring multiple uses daily. Chest x-ray was negative on  admission.  HOSPITAL COURSE:   Acute respiratory failure with hypoxia / Asthma exacerbation w/ acute bronchitis -Patient presents with asthma exacerbation preceded by upper respiratory infection symptoms that appears to be consistent with a viral syndrome although now associated with leukocytosis and hypoxemia -No focal infiltrate on chest x-ray but given leukocytosis will begin Zithromax to cover for bronchitis -Respiratory viral panel still pending Patient currently 97% on room air with ambulation -Continue scheduled as well as prn duo nebs -Begin Singulair;  started patient on LABA + inhaled steroid -Solu-Medrol 60 mg BID, now transition to prednisone for 5 days -Does not have PCP so case management consulted   :   Leukocytosis -No fever but does have leukocytosis noting CBC was checked prior to administration of steroids -Antibiotics as above      Family history of diabetes mellitus in brother -Patient presents with hyperglycemia CBG 161 prior to administration of steroids Hemoglobin A1c pending      Discharge Exam:   Blood pressure 114/73, pulse 82, temperature 98.2 F (36.8 C), temperature source Oral, resp. rate 18, height 5' 3"  (1.6 m), weight 70.8 kg (156 lb), SpO2 97 %. Cardiovascular: Regular slightly tachycardic rate and rhythm, no murmurs / rubs / gallops. No extremity edema. 2+ pedal pulses. No carotid bruits.  Abdomen: no tenderness, no masses palpated. No hepatosplenomegaly. Bowel sounds positive.  Musculoskeletal: no clubbing / cyanosis. No joint deformity upper and lower extremities. Good ROM, no contractures. Normal muscle tone.  Skin: no rashes, lesions, ulcers. No induration Neurologic: CN 2-12 grossly intact. Sensation intact, DTR normal. Strength 5/5 x all 4 extremities.  Psychiatric: Normal judgment and insight. Alert and oriented x 3. Normal mood.     Follow-up [place] [facility]  [facility] Follow up on 08/17/2016.   Why:   Follow up appointment with Dr. Joya Gaskins at the [facility] Wednesday 9/20 at [place] information: [place] [id number] [phone]          Signed: Reyne Dumas 08/15/2016, 8:26 AM        Time spent >45 mins

## 2016-08-15 NOTE — Progress Notes (Signed)
Patient discharged to home. All discharge instructions reviewed. Prescriptions reviewed. IV removed. Patients belongings in tow. Patient has followup appt with [facility]. Patient left with family in stable condition.  Sheliah Plane RN

## 2016-08-17 ENCOUNTER — Encounter: Payer: Self-pay | Admitting: Critical Care Medicine

## 2016-08-17 ENCOUNTER — Ambulatory Visit: Payer: Self-pay | Attending: Critical Care Medicine | Admitting: Critical Care Medicine

## 2016-08-17 VITALS — BP 131/81 | HR 63 | Temp 98.2°F | Resp 18 | Ht 63.0 in | Wt 158.0 lb

## 2016-08-17 DIAGNOSIS — D72829 Elevated white blood cell count, unspecified: Secondary | ICD-10-CM | POA: Insufficient documentation

## 2016-08-17 DIAGNOSIS — J45901 Unspecified asthma with (acute) exacerbation: Secondary | ICD-10-CM | POA: Insufficient documentation

## 2016-08-17 DIAGNOSIS — J206 Acute bronchitis due to rhinovirus: Secondary | ICD-10-CM

## 2016-08-17 DIAGNOSIS — J4541 Moderate persistent asthma with (acute) exacerbation: Secondary | ICD-10-CM

## 2016-08-17 DIAGNOSIS — Z0000 Encounter for general adult medical examination without abnormal findings: Secondary | ICD-10-CM

## 2016-08-17 DIAGNOSIS — J4540 Moderate persistent asthma, uncomplicated: Secondary | ICD-10-CM | POA: Insufficient documentation

## 2016-08-17 NOTE — Progress Notes (Signed)
Patient is here for HFU for Asthma  Patient complains of wheezing being present. Patient denies any SOB or Edema.  Patient would like the flu vaccine today. Patient tolerated flu vaccine well today.  Patient has taken medication today and patient has eaten today.

## 2016-08-17 NOTE — Assessment & Plan Note (Signed)
Asthmatic bronchitis with flare Plan Finish current pulse pred dose Finish azithromycin reinstructed as to proper HFA use of symbicort Cont symbicort two puff bid rov 6 weeks Give flu vaccine

## 2016-08-17 NOTE — Assessment & Plan Note (Signed)
Mod persistent asthma Plan Cont curr symbicort

## 2016-08-17 NOTE — Assessment & Plan Note (Signed)
Rhinovirus was trigger for Asthma exac Plan  cont symbicort two puff bid See asthma exac assess

## 2016-08-17 NOTE — Patient Instructions (Addendum)
Influenza (Flu) Vaccine (Inactivated or Recombinant):  1. Why get vaccinated? Influenza ("flu") is a contagious disease that spreads around the [place] every year, usually between October and May. Flu is caused by influenza viruses, and is spread mainly by coughing, sneezing, and close contact. Anyone can get flu. Flu strikes suddenly and can last several days. Symptoms vary by age, but can include:  fever/chills  sore throat  muscle aches  fatigue  cough  headache  runny or stuffy nose Flu can also lead to pneumonia and blood infections, and cause diarrhea and seizures in children. If you have a medical condition, such as heart or lung disease, flu can make it worse. Flu is more dangerous for some people. Infants and young children, people 65 years of age and older, pregnant women, and people with certain health conditions or a weakened immune system are at greatest risk. Each year thousands of people in the [place] die from flu, and many more are hospitalized. Flu vaccine can:  keep you from getting flu,  make flu less severe if you do get it, and  keep you from spreading flu to your family and other people. 2. Inactivated and recombinant flu vaccines A dose of flu vaccine is recommended every flu season. Children 6 months through 8 years of age may need two doses during the same flu season. Everyone else needs only one dose each flu season. Some inactivated flu vaccines contain a very small amount of a mercury-based preservative called thimerosal. Studies have not shown thimerosal in vaccines to be harmful, but flu vaccines that do not contain thimerosal are available. There is no live flu virus in flu shots. They cannot cause the flu. There are many flu viruses, and they are always changing. Each year a new flu vaccine is made to protect against three or four viruses that are likely to cause disease in the upcoming flu season. But even when the vaccine doesn't exactly  match these viruses, it may still provide some protection. Flu vaccine cannot prevent:  flu that is caused by a virus not covered by the vaccine, or  illnesses that look like flu but are not. It takes about 2 weeks for protection to develop after vaccination, and protection lasts through the flu season. 3. Some people should not get this vaccine Tell the person who is giving you the vaccine:  If you have any severe, life-threatening allergies. If you ever had a life-threatening allergic reaction after a dose of flu vaccine, or have a severe allergy to any part of this vaccine, you may be advised not to get vaccinated. Most, but not all, types of flu vaccine contain a small amount of egg protein.  If you ever had Guillain-Barre Syndrome (also called GBS). Some people with a history of GBS should not get this vaccine. This should be discussed with your doctor.  If you are not feeling well. It is usually okay to get flu vaccine when you have a mild illness, but you might be asked to come back when you feel better. 4. Risks of a vaccine reaction With any medicine, including vaccines, there is a chance of reactions. These are usually mild and go away on their own, but serious reactions are also possible. Most people who get a flu shot do not have any problems with it. Minor problems following a flu shot include:  soreness, redness, or swelling where the shot was given  hoarseness  sore, red or itchy eyes  cough    fever  aches  headache  itching  fatigue If these problems occur, they usually begin soon after the shot and last 1 or 2 days. More serious problems following a flu shot can include the following:  There may be a small increased risk of Guillain-Barre Syndrome (GBS) after inactivated flu vaccine. This risk has been estimated at 1 or 2 additional cases per million people vaccinated. This is much lower than the risk of severe complications from flu, which can be prevented by  flu vaccine.  Young children who get the flu shot along with pneumococcal vaccine (PCV13) and/or DTaP vaccine at the same time might be slightly more likely to have a seizure caused by fever. Ask your doctor for more information. Tell your doctor if a child who is getting flu vaccine has ever had a seizure. Problems that could happen after any injected vaccine:  People sometimes faint after a medical procedure, including vaccination. Sitting or lying down for about 15 minutes can help prevent fainting, and injuries caused by a fall. Tell your doctor if you feel dizzy, or have vision changes or ringing in the ears.  Some people get severe pain in the shoulder and have difficulty moving the arm where a shot was given. This happens very rarely.  Any medication can cause a severe allergic reaction. Such reactions from a vaccine are very rare, estimated at about 1 in a million doses, and would happen within a few minutes to a few hours after the vaccination. As with any medicine, there is a very remote chance of a vaccine causing a serious injury or death. The safety of vaccines is always being monitored. For more information, visit: http://www.aguilar.org/ 5. What if there is a serious reaction? What should I look for?  Look for anything that concerns you, such as signs of a severe allergic reaction, very high fever, or unusual behavior. Signs of a severe allergic reaction can include hives, swelling of the face and throat, difficulty breathing, a fast heartbeat, dizziness, and weakness. These would start a few minutes to a few hours after the vaccination. What should I do?  If you think it is a severe allergic reaction or other emergency that can't wait, call 9-1-1 and get the person to the nearest hospital. Otherwise, call your doctor.  Reactions should be reported to the Vaccine Adverse Event Reporting System (VAERS). Your doctor should file this report, or you can do it yourself through the  VAERS web site at www.vaers.SamedayNews.es, or by calling 450-832-3217. VAERS does not give medical advice. 6. The National Vaccine Injury Compensation Program Tfhe National Vaccine Injury Compensation Program (VICP) is a federal program that was created to compensate people who may have been injured by certain vaccines. Persons who believe they may have been injured by a vaccine can learn about the program and about filing a claim by calling (314)573-3692 or visiting the [facility] website at GoldCloset.com.ee. There is a time limit to file a claim for compensation. 7. How can I learn more?  Ask your healthcare provider. He or she can give you the vaccine package insert or suggest other sources of information.  Call your local or state health department.  Contact the Centers for Disease Control and Prevention (CDC):  Call (548) 053-7182 (1-800-CDC-INFO) or  Visit CDC's website at https://gibson.com/ Vaccine Information Statement Inactivated Influenza Vaccine (07/04/2014)   This information is not intended to replace advice given to you by your health care provider. Make sure you discuss any questions you have with  your health care provider.   Document Released: 09/08/2006 Document Revised: 12/05/2014 Document Reviewed: 07/07/2014  A flu vaccine will be given  Finish Prednisone and azithromycin Stay on Symbicort two puff twice daily, pharmacy reviewed with you your technique Return 6 weeks  We will obtain for you a new Primary Care provider     Elsevier Interactive Patient Education 2016 [place].

## 2016-08-17 NOTE — Progress Notes (Signed)
Subjective:    Patient ID: Joseph Fitzgerald, male    DOB: 06/14/1971, 45 y.o.   MRN: [id number]  45 y.o. AM PMHx chronic asthma several prior hospitalizations over the past 3 years. Patient reports that this past Thursday he began having difficulty breathing, wheezing, cough and shortness of breath with rhinorrhea. No fevers. No nausea, vomiting or diarrhea. Productive cough w/ clear sputum. According to the triage notes patient's room-air saturation was 72% at presentation  Hx of asthma.  Started in 20s.    Viral test pos rhinovirus  Now is better  CXR neg   Asthma  He complains of cough, sputum production and wheezing. There is no chest tightness, difficulty breathing, frequent throat clearing, hemoptysis or shortness of breath. This is a recurrent problem. The current episode started more than 1 year ago. The problem occurs daily. The problem has been rapidly improving. The cough is productive of sputum. Associated symptoms include heartburn. Pertinent negatives include no appetite change, chest pain, dyspnea on exertion, ear congestion, ear pain, fever, headaches, nasal congestion, orthopnea, PND, rhinorrhea, sneezing, sore throat or trouble swallowing. His symptoms are aggravated by change in weather, exposure to smoke, pollen and URI. His symptoms are alleviated by steroid inhaler, beta-agonist and oral steroids (abx). His past medical history is significant for asthma. There is no history of bronchitis, COPD, emphysema or pneumonia.    Past Medical History:  Diagnosis Date  . Asthma   . Shortness of breath dyspnea      Family History  Problem Relation Age of Onset  . Asthma Brother   . Diabetes Brother      Social History   Social History  . Marital status: Single    Spouse name: N/A  . Number of children: 3  . Years of education: 12   Occupational History  . [employer]   Social History Main Topics  . Smoking status: Never Smoker  . Smokeless tobacco: Current  User    Types: Chew  . Alcohol use Yes     Comment: beer  . Drug use: No  . Sexual activity: Yes   Other Topics Concern  . Not on file   Social History Narrative  . No narrative on file     Allergies  Allergen Reactions  . Penicillins Rash     Outpatient Medications Prior to Visit  Medication Sig Dispense Refill  . albuterol (PROVENTIL HFA;VENTOLIN HFA) 108 (90 Base) MCG/ACT inhaler Inhale 2 puffs into the lungs every 4 (four) hours as needed for wheezing. 3 Inhaler 3  . azithromycin (ZITHROMAX) 500 MG tablet Take 1 tablet (500 mg total) by mouth daily. 5 tablet 0  . budesonide-formoterol (SYMBICORT) 160-4.5 MCG/ACT inhaler Inhale 2 puffs into the lungs 2 (two) times daily. 1 Inhaler 12  . montelukast (SINGULAIR) 10 MG tablet Take 1 tablet (10 mg total) by mouth at bedtime. 30 tablet 1  . predniSONE (DELTASONE) 50 MG tablet 50 mg daily 5 tablet 0   No facility-administered medications prior to visit.      Review of Systems  Constitutional: Negative for appetite change and fever.  HENT: Negative for ear pain, rhinorrhea, sneezing, sore throat and trouble swallowing.   Respiratory: Positive for cough, sputum production and wheezing. Negative for hemoptysis and shortness of breath.   Cardiovascular: Negative for chest pain, dyspnea on exertion and PND.  Gastrointestinal: Positive for heartburn.  Neurological: Negative for headaches.       Objective:   Physical Exam Vitals:  08/17/16 1109  BP: 131/81  Pulse: 63  Resp: 18  Temp: 98.2 F (36.8 C)  TempSrc: Oral  SpO2: 96%  Weight: 158 lb (71.7 kg)  Height: 5' 3"  (1.6 m)    Gen: Pleasant, well-nourished, in no distress,  normal affect  ENT: No lesions,  mouth clear,  oropharynx clear, no postnasal drip  Neck: No JVD, no TMG, no carotid bruits  Lungs: No use of accessory muscles, no dullness to percussion, insp exp wheezes  Cardiovascular: RRR, heart sounds normal, no murmur or gallops, no peripheral  edema  Abdomen: soft and NT, no HSM,  BS normal  Musculoskeletal: No deformities, no cyanosis or clubbing  Neuro: alert, non focal  Skin: Warm, no lesions or rashes  No results found.   All labs reviewed , leukocytosis better,    CXR normal  Rhinovirus pos on viral study      Assessment & Plan:  I personally reviewed all images and lab data in the [facility] system as well as any outside material available during this office visit and agree with the  radiology impressions.   Asthma exacerbation Asthmatic bronchitis with flare Plan Finish current pulse pred dose Finish azithromycin reinstructed as to proper HFA use of symbicort Cont symbicort two puff bid rov 6 weeks Give flu vaccine  Acute bronchitis due to Rhinovirus Rhinovirus was trigger for Asthma exac Plan  cont symbicort two puff bid See asthma exac assess  Asthma, moderate persistent Mod persistent asthma Plan Cont curr symbicort    Diagnoses and all orders for this visit:  Asthma, moderate persistent, with acute exacerbation  Acute bronchitis due to Rhinovirus  Healthcare maintenance -     Flu Vaccine QUAD 36+ mos PF IM (Fluarix & Fluzone Quad PF)  Asthma exacerbation

## 2016-10-17 ENCOUNTER — Telehealth: Payer: Self-pay | Admitting: Family Medicine

## 2016-10-17 DIAGNOSIS — J4520 Mild intermittent asthma, uncomplicated: Secondary | ICD-10-CM

## 2016-10-17 NOTE — Telephone Encounter (Signed)
Patient wife called requesting refills on medication:  albuterol (PROVENTIL HFA;VENTOLIN HFA) 108 (90 Base)    Please send to  [facility]   Pharmacy:  Per wife pharmacy informed doesn't have anymore refills Did inform Joseph Fitzgerald last RX had 3 refills.  Please follow up with patient.

## 2016-10-18 MED ORDER — ALBUTEROL SULFATE HFA 108 (90 BASE) MCG/ACT IN AERS: 2.0000 | INHALATION_SPRAY | RESPIRATORY_TRACT | 0 refills | Status: DC | PRN

## 2016-10-18 MED FILL — VENTOLIN HFA 90 MCG INHALER: 108 (90 BAS | 25 days supply | Qty: 18 | Fill #0

## 2016-10-18 NOTE — Telephone Encounter (Signed)
Albuterol refilled - patient needs office visit.

## 2016-10-25 MED FILL — SYMBICORT 160-4.5 MCG INH: 160-4.5 | 20 days supply | Qty: 10 | Fill #1

## 2016-10-28 ENCOUNTER — Encounter: Payer: Self-pay | Admitting: Family Medicine

## 2016-10-28 ENCOUNTER — Telehealth: Payer: Self-pay

## 2016-10-28 ENCOUNTER — Ambulatory Visit: Payer: Self-pay | Attending: Family Medicine | Admitting: Family Medicine

## 2016-10-28 VITALS — BP 128/86 | HR 68 | Temp 98.9°F | Ht 63.0 in | Wt 160.2 lb

## 2016-10-28 DIAGNOSIS — Z23 Encounter for immunization: Secondary | ICD-10-CM

## 2016-10-28 DIAGNOSIS — L2082 Flexural eczema: Secondary | ICD-10-CM | POA: Insufficient documentation

## 2016-10-28 DIAGNOSIS — Z79899 Other long term (current) drug therapy: Secondary | ICD-10-CM | POA: Insufficient documentation

## 2016-10-28 DIAGNOSIS — J45909 Unspecified asthma, uncomplicated: Secondary | ICD-10-CM | POA: Insufficient documentation

## 2016-10-28 DIAGNOSIS — Z7951 Long term (current) use of inhaled steroids: Secondary | ICD-10-CM | POA: Insufficient documentation

## 2016-10-28 DIAGNOSIS — J4541 Moderate persistent asthma with (acute) exacerbation: Secondary | ICD-10-CM | POA: Insufficient documentation

## 2016-10-28 MED ORDER — TRIAMCINOLONE ACETONIDE 0.5 % EX OINT: 1.0000 "application " | TOPICAL_OINTMENT | Freq: Two times a day (BID) | CUTANEOUS | 3 refills | Status: DC

## 2016-10-28 MED ORDER — BUDESONIDE-FORMOTEROL FUMARATE 160-4.5 MCG/ACT IN AERO: 2.0000 | INHALATION_SPRAY | Freq: Two times a day (BID) | RESPIRATORY_TRACT | 12 refills | Status: DC

## 2016-10-28 MED ORDER — MONTELUKAST SODIUM 10 MG PO TABS: 10.0000 mg | ORAL_TABLET | Freq: Every day | ORAL | 11 refills | Status: DC

## 2016-10-28 MED ORDER — ALBUTEROL SULFATE HFA 108 (90 BASE) MCG/ACT IN AERS: 2.0000 | INHALATION_SPRAY | RESPIRATORY_TRACT | 11 refills | Status: DC | PRN

## 2016-10-28 MED FILL — ?MONTELUKAST SOD 10 MG TAB: 10 MG | 30 days supply | Qty: 30 | Fill #0

## 2016-10-28 MED FILL — TRIAMCINOLONE 0.5% OINTMENT: 0.5 | 15 days supply | Qty: 30 | Fill #0

## 2016-10-28 NOTE — Progress Notes (Signed)
Subjective:  Patient ID: Joseph Fitzgerald, male    DOB: 07/16/1971  Age: 45 y.o. MRN: [id number]  CC: Asthma   HPI Rogen Porte presents for   1. Asthma: he is compliant with Symbicort. He used his albuterol often last week. He has not used albuterol this week. No CP, SOB or cough. Chronic non smoker, no second hand smoke.   2. Neck rash: occurs every winter. He has itching when he sweat. He has dealt with this same rash for many years,   Social History  Substance Use Topics  . Smoking status: Never Smoker  . Smokeless tobacco: Current User    Types: Chew  . Alcohol use Yes     Comment: beer     Outpatient Medications Prior to Visit  Medication Sig Dispense Refill  . albuterol (PROVENTIL HFA;VENTOLIN HFA) 108 (90 Base) MCG/ACT inhaler Inhale 2 puffs into the lungs every 4 (four) hours as needed for wheezing. 3 Inhaler 0  . budesonide-formoterol (SYMBICORT) 160-4.5 MCG/ACT inhaler Inhale 2 puffs into the lungs 2 (two) times daily. 1 Inhaler 12  . montelukast (SINGULAIR) 10 MG tablet Take 1 tablet (10 mg total) by mouth at bedtime. 30 tablet 1  . predniSONE (DELTASONE) 50 MG tablet 50 mg daily 5 tablet 0   No facility-administered medications prior to visit.     ROS Review of Systems  Constitutional: Negative for chills, fatigue, fever and unexpected weight change.  Eyes: Negative for visual disturbance.  Respiratory: Negative for cough and shortness of breath.   Cardiovascular: Negative for chest pain, palpitations and leg swelling.  Gastrointestinal: Negative for abdominal pain, blood in stool, constipation, diarrhea, nausea and vomiting.  Endocrine: Negative for polydipsia, polyphagia and polyuria.  Musculoskeletal: Negative for arthralgias, back pain, gait problem, myalgias and neck pain.  Skin: Positive for rash.  Allergic/Immunologic: Negative for immunocompromised state.  Hematological: Negative for adenopathy. Does not bruise/bleed easily.  Psychiatric/Behavioral:  Negative for dysphoric mood, sleep disturbance and suicidal ideas. The patient is not nervous/anxious.     Objective:  BP 128/86 (BP Location: Left Arm, Patient Position: Sitting, Cuff Size: Small)   Pulse 68   Temp 98.9 F (37.2 C) (Oral)   Ht 5' 3"  (1.6 m)   Wt 160 lb 3.2 oz (72.7 kg)   SpO2 98%   PF 180 L/min   BMI 28.38 kg/m   PF Readings from Last 3 Encounters:  10/28/16 180 L/min    BP/Weight 08/17/2016 08/15/2016 03/13/6062  Systolic BP 016 010 -  Diastolic BP 81 74 -  Wt. (Lbs) 158 - 156  BMI 27.99 - 27.63    Physical Exam  Constitutional: He appears well-developed and well-nourished. No distress.  HENT:  Head: Normocephalic and atraumatic.  Neck: Normal range of motion. Neck supple.    Cardiovascular: Normal rate, regular rhythm, normal heart sounds and intact distal pulses.   Pulmonary/Chest: Effort normal and breath sounds normal.  Musculoskeletal: He exhibits no edema.  Neurological: He is alert.  Skin: Skin is warm and dry. No rash noted. No erythema.  Psychiatric: He has a normal mood and affect.     Assessment & Plan:  Derryl was seen today for asthma.  Diagnoses and all orders for this visit:  Flexural eczema -     triamcinolone ointment (KENALOG) 0.5 %; Apply 1 application topically 2 (two) times daily. Apply to neck rash  Moderate persistent asthma with acute exacerbation -     montelukast (SINGULAIR) 10 MG tablet; Take 1 tablet (10 mg  total) by mouth at bedtime. -     budesonide-formoterol (SYMBICORT) 160-4.5 MCG/ACT inhaler; Inhale 2 puffs into the lungs 2 (two) times daily. -     albuterol (PROVENTIL HFA;VENTOLIN HFA) 108 (90 Base) MCG/ACT inhaler; Inhale 2 puffs into the lungs every 4 (four) hours as needed for wheezing.   There are no diagnoses linked to this encounter.  No orders of the defined types were placed in this encounter.   Follow-up: Return in about 3 months (around 01/26/2017) for asthma .   Boykin Nearing MD

## 2016-10-28 NOTE — Progress Notes (Signed)
Peak flow results:  1.90  2.180  3.150

## 2016-10-28 NOTE — Assessment & Plan Note (Signed)
Add topical steroid Advised daily use of emollient

## 2016-10-28 NOTE — Patient Instructions (Addendum)
Joseph Fitzgerald was seen today for asthma.  Diagnoses and all orders for this visit:  Flexural eczema -     triamcinolone ointment (KENALOG) 0.5 %; Apply 1 application topically 2 (two) times daily. Apply to neck rash  Moderate persistent asthma with acute exacerbation -     montelukast (SINGULAIR) 10 MG tablet; Take 1 tablet (10 mg total) by mouth at bedtime. -     budesonide-formoterol (SYMBICORT) 160-4.5 MCG/ACT inhaler; Inhale 2 puffs into the lungs 2 (two) times daily. -     albuterol (PROVENTIL HFA;VENTOLIN HFA) 108 (90 Base) MCG/ACT inhaler; Inhale 2 puffs into the lungs every 4 (four) hours as needed for wheezing.   F/u in 3 months for asthma   Dr.  Adrian Blackwater    Introduction Name: ________________________________ Date: _______  Follow-Up Visit With Health Care Provider Bring your medicines to your follow-up visits.  Health care provider name: ____________________  Telephone: ____________________  How often should I see my health care provider? ____________________ The actions that you should take to control your asthma are based on the symptoms that you are having. The condition can be divided into 3 zones: the green zone, yellow zone, and red zone. Follow the action steps for the zone that you are in each day. Green zone: when asthma is under control   Signs and symptoms You may not have any symptoms while you are in the green zone. This means that you:  Have no coughing or wheezing, even while you are working or playing.  Sleep through the night.  Are breathing well. If you use a peak flow meter:  The peak flow is above __144____ (80% of your personal best or greater). You should take these medicines every day:  Controller medicine and dosage: ______symbicort__________  Controller medicine and dosage: ____singulair ____________  Controller medicine and dosage: ________________  Controller medicine and dosage: ________________  Before exercise, use this reliever or  rescue medicine: ___albuterol_____________ Call your health care provider if:  You are using a reliever or rescue medicine more than 2-3 times per week. Yellow zone: when asthma is getting worse   Signs and symptoms When you are in the yellow zone, you may have symptoms that interfere with exercise, are noticeably worse after exposure to triggers, or are worse at the first sign of a cold (upper respiratory infection). These may include:  Waking from sleep.  Coughing, especially at night or first thing in the morning.  Mild wheezing.  Chest tightness. If you use a peak flow meter:  The peak flow is ___90__ to _143____ (50-79% of your personal best). Add the following medicine to the ones that you use daily:  Reliever or rescue medicine and dosage: _____albuterol ___________  Additional medicine and dosage: ______prednisone, call for prescription__________ Call your health care provider if:  You are using a reliever or rescue medicine more than 2-3 times per week.  You remain in the yellow zone for __12___ hours. Red zone: when asthma is severe   Signs and symptoms You will likely feel distressed and have symptoms at rest that restrict your activity. You are in the red zone if:  Your breathing is hard and quickly.  Your nose opens wide, your ribs show, and your neck muscles become visible when you breathe in.  Your lips, fingers, or toes are a bluish color.  You have trouble speaking in full sentences.  Your symptoms do not improve within 15-20 minutes after you use your reliever or rescue medicine (bronchodilator). If you use  a peak flow meter:  The peak flow is less than ____90_ (less than 50% of your personal best). Call your local emergency services (911 in the [place] right away or seek help at the emergency department of the nearest hospital.  Use your reliever or rescue medicine.  Start a nebulizer treatment or take 2-4 puffs from a metered-dose inhaler with a  spacer.  Repeat this action every 15-20 minutes until help arrives. What are some common asthma triggers? Discuss your asthma triggers with your health care provider. Some common triggers are:  Dander from the skin, hair, or feathers of animals.  Dust mites.  Cockroaches.  Pollen from trees or grass.  Mold.  Cigarette or tobacco smoke.  Air pollutants, such as dust, household cleaners, hair sprays, aerosol sprays, scented candles, paint fumes, strong chemicals, or strong odors.  Cold air or changes in weather. Cold air may cause inflammation. Winds increase molds and pollens in the air.  Strong emotions, such as crying or laughing hard.  Stress.  Certain medicines, such as aspirin or beta blockers.  Sulfites in foods and drinks, such as dried fruits and wine.  Infections or inflammatory conditions, such as:  Flu (influenza).  Upper respiratory tract infection.  Lower respiratory tract infection (pneumonia or bronchitis).  Inflammation of the nasal membranes (rhinitis).  Gastroesophageal reflux disease (GERD). GERD is a condition in which stomach acid comes up into the throat (esophagus).  Exercise or strenuous activity. This information is not intended to replace advice given to you by your health care provider. Make sure you discuss any questions you have with your health care provider. Document Released: 09/11/2009 Document Revised: 07/11/2016 Document Reviewed: 09/02/2014 Elsevier Interactive Patient Education  2017 [employer].

## 2016-10-28 NOTE — Assessment & Plan Note (Addendum)
A:  peak flow is 30 % of predicted No evidence of acute exacerbation  P: Continue controlled regimen Restart singular daily  Provided asthma action plan

## 2016-10-31 NOTE — Telephone Encounter (Signed)
Printed script for pass.

## 2016-11-22 ENCOUNTER — Emergency Department (HOSPITAL_COMMUNITY): Payer: Self-pay

## 2016-11-22 ENCOUNTER — Inpatient Hospital Stay (HOSPITAL_COMMUNITY)
Admission: EM | Admit: 2016-11-22 | Discharge: 2016-11-26 | DRG: 871 | Disposition: A | Discharge status: Home / Self Care | Payer: Self-pay | Attending: Internal Medicine | Admitting: Internal Medicine

## 2016-11-22 ENCOUNTER — Encounter (HOSPITAL_COMMUNITY): Payer: Self-pay | Admitting: *Deleted

## 2016-11-22 DIAGNOSIS — J4541 Moderate persistent asthma with (acute) exacerbation: Secondary | ICD-10-CM | POA: Diagnosis present

## 2016-11-22 DIAGNOSIS — L2082 Flexural eczema: Secondary | ICD-10-CM | POA: Diagnosis present

## 2016-11-22 DIAGNOSIS — J45901 Unspecified asthma with (acute) exacerbation: Secondary | ICD-10-CM

## 2016-11-22 DIAGNOSIS — Z833 Family history of diabetes mellitus: Secondary | ICD-10-CM

## 2016-11-22 DIAGNOSIS — Z825 Family history of asthma and other chronic lower respiratory diseases: Secondary | ICD-10-CM

## 2016-11-22 DIAGNOSIS — A4189 Other specified sepsis: Principal | ICD-10-CM | POA: Diagnosis present

## 2016-11-22 DIAGNOSIS — J9601 Acute respiratory failure with hypoxia: Secondary | ICD-10-CM | POA: Diagnosis present

## 2016-11-22 DIAGNOSIS — R000 Tachycardia, unspecified: Secondary | ICD-10-CM | POA: Diagnosis present

## 2016-11-22 DIAGNOSIS — A419 Sepsis, unspecified organism: Secondary | ICD-10-CM | POA: Diagnosis present

## 2016-11-22 DIAGNOSIS — B974 Respiratory syncytial virus as the cause of diseases classified elsewhere: Secondary | ICD-10-CM | POA: Diagnosis present

## 2016-11-22 DIAGNOSIS — D72829 Elevated white blood cell count, unspecified: Secondary | ICD-10-CM | POA: Diagnosis present

## 2016-11-22 DIAGNOSIS — R6520 Severe sepsis without septic shock: Secondary | ICD-10-CM | POA: Diagnosis present

## 2016-11-22 DIAGNOSIS — Z7951 Long term (current) use of inhaled steroids: Secondary | ICD-10-CM

## 2016-11-22 DIAGNOSIS — F17220 Nicotine dependence, chewing tobacco, uncomplicated: Secondary | ICD-10-CM | POA: Diagnosis present

## 2016-11-22 DIAGNOSIS — Z79899 Other long term (current) drug therapy: Secondary | ICD-10-CM

## 2016-11-22 LAB — BASIC METABOLIC PANEL
ANION GAP: 13 (ref 5–15)
BUN: 11 mg/dL (ref 6–20)
CHLORIDE: 103 mmol/L (ref 101–111)
CO2: 24 mmol/L (ref 22–32)
Calcium: 9.2 mg/dL (ref 8.9–10.3)
Creatinine, Ser: 1.15 mg/dL (ref 0.61–1.24)
GFR calc Af Amer: >60 mL/min (ref >60)
GFR calc non Af Amer: >60 mL/min (ref >60)
GLUCOSE: 168 mg/dL — AB (ref 65–99)
POTASSIUM: 3.8 mmol/L (ref 3.5–5.1)
Sodium: 140 mmol/L (ref 135–145)

## 2016-11-22 LAB — CBC
HEMATOCRIT: 49.7 % (ref 39.0–52.0)
HEMOGLOBIN: 17.7 g/dL — AB (ref 13.0–17.0)
MCH: 32.6 pg (ref 26.0–34.0)
MCHC: 35.6 g/dL (ref 30.0–36.0)
MCV: 91.5 fL (ref 78.0–100.0)
Platelets: 281 10*3/uL (ref 150–400)
RBC: 5.43 MIL/uL (ref 4.22–5.81)
RDW: 12.2 % (ref 11.5–15.5)
WBC: 15 10*3/uL — ABNORMAL HIGH (ref 4.0–10.5)

## 2016-11-22 LAB — TROPONIN I: Troponin I: <0.03 ng/mL (ref <0.03)

## 2016-11-22 MED ORDER — IPRATROPIUM BROMIDE 0.02 % IN SOLN
0.5000 mg | Freq: Once | RESPIRATORY_TRACT | Status: AC
  Administered 2016-11-22: 0.5 mg via RESPIRATORY_TRACT
  Filled 2016-11-22: qty 2.5

## 2016-11-22 MED ORDER — PREDNISONE 20 MG PO TABS
10.0000 mg | ORAL_TABLET | Freq: Every day | ORAL | Status: DC
  Administered 2016-11-22: 10 mg via ORAL
  Filled 2016-11-22: qty 1

## 2016-11-22 MED ORDER — ALBUTEROL SULFATE (2.5 MG/3ML) 0.083% IN NEBU
5.0000 mg | INHALATION_SOLUTION | Freq: Once | RESPIRATORY_TRACT | Status: AC
  Administered 2016-11-22: 5 mg via RESPIRATORY_TRACT
  Filled 2016-11-22: qty 6

## 2016-11-22 MED ORDER — SODIUM CHLORIDE 0.9 % IV SOLN
INTRAVENOUS | Status: DC
  Administered 2016-11-23 – 2016-11-25 (×7): via INTRAVENOUS

## 2016-11-22 MED ORDER — ALBUTEROL (5 MG/ML) CONTINUOUS INHALATION SOLN
10.0000 mg/h | INHALATION_SOLUTION | Freq: Once | RESPIRATORY_TRACT | Status: AC
  Administered 2016-11-22: 10 mg/h via RESPIRATORY_TRACT
  Filled 2016-11-22: qty 20

## 2016-11-22 NOTE — ED Provider Notes (Signed)
[facility] DEPT Provider Note   CSN: [id number] Arrival date & time: 11/22/16  2121  History   Chief Complaint Chief Complaint  Patient presents with  . Shortness of Breath    HPI Joseph Fitzgerald is a 45 y.o. male.  HPI   Joseph Fitzgerald is a 45 y.o. male with a PMHx of asthma, who presents to the Emergency Department complaining of gradual onset, gradually worsening, constant SOB onset around 5 pm today. He reports a headache and sore throat yesterday but they have resolved He has been using his daily inhaler a rescue inhaler at home with minimal relief of his symptoms. Per family member, pt has a hx of asthma with similar symptoms. Pt is a non-smoker. Denies fever, chills, chest pain, abdominal pain, or pain otherwise. Prior chart review shows that the pt was last admitted for this problem this past December and stayed for 1 day. Pt given breathing treatment on arrival and has since improved greatly, no longer distressed. Denies having fevers, weakness, CP, le swelling, recent illness.     Past Medical History:  Diagnosis Date  . Asthma   . Shortness of breath dyspnea     Patient Active Problem List   Diagnosis Date Noted  . Asthma exacerbation 11/23/2016  . Asthma, moderate persistent 08/17/2016  . Vitamin D deficiency 09/01/2015  . Family history of diabetes mellitus in brother 08/31/2015  . Eczema 08/31/2015  . Environmental allergies 04/16/2014  . Acute respiratory failure with hypoxia ([place]) 06/12/2013    Past Surgical History:  Procedure Laterality Date  . NO PAST SURGERIES       Home Medications    Prior to Admission medications   Medication Sig Start Date End Date Taking? Authorizing Provider  albuterol (PROVENTIL HFA;VENTOLIN HFA) 108 (90 Base) MCG/ACT inhaler Inhale 2 puffs into the lungs every 4 (four) hours as needed for wheezing. 10/28/16  Yes Josalyn Funches, MD  budesonide-formoterol (SYMBICORT) 160-4.5 MCG/ACT inhaler Inhale 2 puffs into the lungs 2  (two) times daily. 10/28/16  Yes Josalyn Funches, MD  montelukast (SINGULAIR) 10 MG tablet Take 1 tablet (10 mg total) by mouth at bedtime. 10/28/16  Yes Josalyn Funches, MD  triamcinolone ointment (KENALOG) 0.5 % Apply 1 application topically 2 (two) times daily. Apply to neck rash Patient not taking: Reported on 11/22/2016 10/28/16   Boykin Nearing, MD    Family History Family History  Problem Relation Age of Onset  . Asthma Brother   . Diabetes Brother     Social History Social History  Substance Use Topics  . Smoking status: Never Smoker  . Smokeless tobacco: Current User    Types: Chew  . Alcohol use Yes     Comment: beer     Allergies   Penicillins   Review of Systems Review of Systems Review of Systems All other systems negative except as documented in the HPI. All pertinent positives and negatives as reviewed in the HPI.   Physical Exam Updated Vital Signs BP 114/85   Pulse (!) 135   Temp 99.3 F (37.4 C) (Oral)   Resp 16   SpO2 96%   Physical Exam  Constitutional: He appears well-developed and well-nourished. No distress. He is not intubated.  HENT:  Head: Normocephalic and atraumatic.  Right Ear: Tympanic membrane and ear canal normal.  Left Ear: Tympanic membrane and ear canal normal.  Nose: Nose normal.  Mouth/Throat: Uvula is midline, oropharynx is clear and moist and mucous membranes are normal.  Eyes: Pupils are equal, round,  and reactive to light.  Neck: Normal range of motion. Neck supple.  Cardiovascular: Normal rate and regular rhythm.   Pulmonary/Chest: Accessory muscle usage present. Tachypnea noted. No apnea and no bradypnea. He is not intubated. No respiratory distress. He has no decreased breath sounds. He has wheezes (inspiratory and expiratory wheezing). He has no rhonchi. He has no rales.  Abdominal: Soft.  No signs of abdominal distention  Musculoskeletal:  No LE swelling  Neurological: He is alert.  Acting at baseline  Skin: Skin  is warm and dry. No rash noted.  Nursing note and vitals reviewed.    ED Treatments / Results  Labs (all labs ordered are listed, but only abnormal results are displayed) Labs Reviewed  BASIC METABOLIC PANEL - Abnormal; Notable for the following:       Result Value   Glucose, Bld 168 (*)    All other components within normal limits  CBC - Abnormal; Notable for the following:    WBC 15.0 (*)    Hemoglobin 17.7 (*)    All other components within normal limits  TROPONIN I    EKG  EKG Interpretation None       Radiology Dg Chest Portable 1 View  Result Date: 11/22/2016 CLINICAL DATA:  Shortness of breath tonight. EXAM: PORTABLE CHEST 1 VIEW COMPARISON:  08/14/2016 FINDINGS: The cardiomediastinal contours are normal. Mild hyperinflation. Pulmonary vasculature is normal. No consolidation, pleural effusion, or pneumothorax. No acute osseous abnormalities are seen. IMPRESSION: Mild hyperinflation without localizing abnormality. This can be seen with asthma, bronchitis or emphysema. Electronically Signed   By: Jeb Levering M.D.   On: 11/22/2016 23:42    Procedures Procedures (including critical care time)  Medications Ordered in ED Medications  predniSONE (DELTASONE) tablet 10 mg (10 mg Oral Given 11/22/16 2302)  0.9 %  sodium chloride infusion (not administered)  albuterol (PROVENTIL) (2.5 MG/3ML) 0.083% nebulizer solution 5 mg (5 mg Nebulization Given 11/22/16 2136)  ipratropium (ATROVENT) nebulizer solution 0.5 mg (0.5 mg Nebulization Given 11/22/16 2137)  ipratropium (ATROVENT) nebulizer solution 0.5 mg (0.5 mg Nebulization Given 11/22/16 2245)  albuterol (PROVENTIL,VENTOLIN) solution continuous neb (10 mg/hr Nebulization Given 11/22/16 2245)     Initial Impression / Assessment and Plan / ED Course  I have reviewed the triage vital signs and the nursing notes.  Pertinent labs & imaging results that were available during my care of the patient were reviewed by me and  considered in my medical decision making (see chart for details).  Clinical Course     Patient to receive hour long neb, originally he was hypoxic but he has improved greatly after regular nebulizer treatment. Will initiate hour long neb and monitor.  CRITICAL CARE Performed by: Linus Mako Total critical care time: 35 minutes Critical care time was exclusive of separately billable procedures and treating other patients. Critical care was necessary to treat or prevent imminent or life-threatening deterioration. Critical care was time spent personally by me on the following activities: development of treatment plan with patient and/or surrogate as well as nursing, discussions with consultants, evaluation of patient's response to treatment, examination of patient, obtaining history from patient or surrogate, ordering and performing treatments and interventions, ordering and review of laboratory studies, ordering and review of radiographic studies, pulse oximetry and re-evaluation of patient's condition.   Final Clinical Impressions(s) / ED Diagnoses   Final diagnoses:  Severe asthma with exacerbation, unspecified whether persistent   12: 47 am He has had two treatments and an hour long nebulizer  treatment, continues to be diaphoretic and have inspiratory and expiratory wheezing, is sating at 100% on room. Dr. Betsey Holiday has seen patient as well and agrees with admission.  Dr. Blaine Hamper has agreed to admit, unassigned to [facility], [place], Tele    New Prescriptions New Prescriptions   No medications on file     Delos Haring, PA-C 11/23/16 [place], MD 11/23/16 [phone]

## 2016-11-22 NOTE — ED Triage Notes (Signed)
The pt is c/o sob  Today he has been using his inhalers that have not helped

## 2016-11-23 DIAGNOSIS — J45901 Unspecified asthma with (acute) exacerbation: Secondary | ICD-10-CM | POA: Diagnosis present

## 2016-11-23 DIAGNOSIS — J9601 Acute respiratory failure with hypoxia: Secondary | ICD-10-CM

## 2016-11-23 DIAGNOSIS — A419 Sepsis, unspecified organism: Secondary | ICD-10-CM | POA: Diagnosis present

## 2016-11-23 LAB — LACTIC ACID, PLASMA
LACTIC ACID, VENOUS: 3.2 mmol/L — AB (ref 0.5–1.9)
LACTIC ACID, VENOUS: 3.7 mmol/L — AB (ref 0.5–1.9)
Lactic Acid, Venous: 3.6 mmol/L (ref 0.5–1.9)

## 2016-11-23 LAB — EXPECTORATED SPUTUM ASSESSMENT W GRAM STAIN, RFLX TO RESP C

## 2016-11-23 LAB — RESPIRATORY PANEL BY PCR
Adenovirus: NOT DETECTED
Bordetella pertussis: NOT DETECTED
CHLAMYDOPHILA PNEUMONIAE-RVPPCR: NOT DETECTED
CORONAVIRUS 229E-RVPPCR: NOT DETECTED
CORONAVIRUS OC43-RVPPCR: NOT DETECTED
Coronavirus HKU1: NOT DETECTED
Coronavirus NL63: NOT DETECTED
INFLUENZA A-RVPPCR: NOT DETECTED
INFLUENZA B-RVPPCR: NOT DETECTED
MYCOPLASMA PNEUMONIAE-RVPPCR: NOT DETECTED
Metapneumovirus: NOT DETECTED
PARAINFLUENZA VIRUS 1-RVPPCR: NOT DETECTED
Parainfluenza Virus 2: NOT DETECTED
Parainfluenza Virus 3: NOT DETECTED
Parainfluenza Virus 4: NOT DETECTED
RESPIRATORY SYNCYTIAL VIRUS-RVPPCR: NOT DETECTED
Rhinovirus / Enterovirus: DETECTED — AB

## 2016-11-23 LAB — EXPECTORATED SPUTUM ASSESSMENT W REFEX TO RESP CULTURE

## 2016-11-23 LAB — PROTIME-INR
INR: 0.98
Prothrombin Time: 13 seconds (ref 11.4–15.2)

## 2016-11-23 LAB — INFLUENZA PANEL BY PCR (TYPE A & B)
INFLAPCR: NEGATIVE
Influenza B By PCR: NEGATIVE

## 2016-11-23 LAB — APTT: APTT: 30 s (ref 24–36)

## 2016-11-23 LAB — PROCALCITONIN: Procalcitonin: <0.1 ng/mL

## 2016-11-23 LAB — STREP PNEUMONIAE URINARY ANTIGEN: Strep Pneumo Urinary Antigen: NEGATIVE

## 2016-11-23 MED ORDER — LEVALBUTEROL HCL 1.25 MG/0.5ML IN NEBU
1.2500 mg | INHALATION_SOLUTION | Freq: Four times a day (QID) | RESPIRATORY_TRACT | Status: DC
  Filled 2016-11-23 (×2): qty 0.5

## 2016-11-23 MED ORDER — ENOXAPARIN SODIUM 40 MG/0.4ML ~~LOC~~ SOLN
40.0000 mg | Freq: Every day | SUBCUTANEOUS | Status: DC
  Administered 2016-11-23 – 2016-11-26 (×4): 40 mg via SUBCUTANEOUS
  Filled 2016-11-23 (×4): qty 0.4

## 2016-11-23 MED ORDER — PNEUMOCOCCAL VAC POLYVALENT 25 MCG/0.5ML IJ INJ: 0.5000 mL | INJECTION | INTRAMUSCULAR | Status: DC | PRN

## 2016-11-23 MED ORDER — MONTELUKAST SODIUM 10 MG PO TABS
10.0000 mg | ORAL_TABLET | Freq: Every day | ORAL | Status: DC
  Administered 2016-11-23 – 2016-11-25 (×4): 10 mg via ORAL
  Filled 2016-11-23 (×4): qty 1

## 2016-11-23 MED ORDER — SODIUM CHLORIDE 0.9 % IV BOLUS (SEPSIS)
2000.0000 mL | Freq: Once | INTRAVENOUS | Status: AC
  Administered 2016-11-23: 2000 mL via INTRAVENOUS

## 2016-11-23 MED ORDER — ZOLPIDEM TARTRATE 5 MG PO TABS: 5.0000 mg | ORAL_TABLET | Freq: Every evening | ORAL | Status: DC | PRN

## 2016-11-23 MED ORDER — IPRATROPIUM-ALBUTEROL 0.5-2.5 (3) MG/3ML IN SOLN
3.0000 mL | Freq: Four times a day (QID) | RESPIRATORY_TRACT | Status: DC
  Administered 2016-11-23 – 2016-11-26 (×13): 3 mL via RESPIRATORY_TRACT
  Filled 2016-11-23 (×12): qty 3

## 2016-11-23 MED ORDER — IPRATROPIUM BROMIDE 0.02 % IN SOLN: 0.5000 mg | RESPIRATORY_TRACT | Status: DC

## 2016-11-23 MED ORDER — AZITHROMYCIN 250 MG PO TABS
250.0000 mg | ORAL_TABLET | Freq: Every day | ORAL | Status: DC
  Administered 2016-11-24 – 2016-11-26 (×3): 250 mg via ORAL
  Filled 2016-11-23 (×4): qty 1

## 2016-11-23 MED ORDER — METHYLPREDNISOLONE SODIUM SUCC 125 MG IJ SOLR
60.0000 mg | Freq: Three times a day (TID) | INTRAMUSCULAR | Status: DC
  Administered 2016-11-23 – 2016-11-25 (×9): 60 mg via INTRAVENOUS
  Filled 2016-11-23 (×9): qty 2

## 2016-11-23 MED ORDER — AZITHROMYCIN 500 MG PO TABS
500.0000 mg | ORAL_TABLET | Freq: Every day | ORAL | Status: AC
  Administered 2016-11-23: 500 mg via ORAL
  Filled 2016-11-23: qty 1

## 2016-11-23 MED ORDER — DM-GUAIFENESIN ER 30-600 MG PO TB12
1.0000 | ORAL_TABLET | Freq: Two times a day (BID) | ORAL | Status: DC
  Administered 2016-11-23 – 2016-11-26 (×8): 1 via ORAL
  Filled 2016-11-23 (×8): qty 1

## 2016-11-23 MED ORDER — IPRATROPIUM-ALBUTEROL 0.5-2.5 (3) MG/3ML IN SOLN
RESPIRATORY_TRACT | Status: AC
  Filled 2016-11-23: qty 3

## 2016-11-23 MED ORDER — SODIUM CHLORIDE 0.9 % IV BOLUS (SEPSIS)
1000.0000 mL | Freq: Once | INTRAVENOUS | Status: AC
  Administered 2016-11-23: 1000 mL via INTRAVENOUS

## 2016-11-23 MED ORDER — ONDANSETRON HCL 4 MG/2ML IJ SOLN
4.0000 mg | Freq: Three times a day (TID) | INTRAMUSCULAR | Status: DC | PRN
Start: 2016-11-23 — End: 2016-11-26

## 2016-11-23 MED ORDER — SODIUM CHLORIDE 0.9 % IV BOLUS (SEPSIS)
500.0000 mL | Freq: Once | INTRAVENOUS | Status: AC
  Administered 2016-11-23: 500 mL via INTRAVENOUS

## 2016-11-23 NOTE — Progress Notes (Signed)
PROGRESS NOTE    Joseph Fitzgerald  MLJ:[id number] DOB: 07-Jul-1971 DOA: 11/22/2016 PCP: Minerva Ends, MD    Brief Narrative:  45 y/o ? Known chronic severe asthma Multiple hospitalizations- last 07/2016 Family history diabetes mellitus  Admitted from emergency room with severe shortness of breath wheezing white mucus White count 15 lack to 3. 2 chest x-ray concerning for hyperinflation without infiltrates tachycardic   Assessment & Plan:   Principal Problem:   Acute respiratory failure with hypoxia (HCC) Active Problems:   Leukocytosis   Asthma exacerbation   Sepsis ([place])   Sepsis possibly secondary to acute viral illness-chest x-ray is negative. Lactic acid is elevated however. Patient's blood pressures are stable but low normal-received 3 L bolus and is currently on IV fluids 1 25 cc/h.  Clinically this does not appear to be a pulmonary embolism--await strep pneumo urinary antigen  Acute exacerbation of moderate persistent asthma-continue Solu-Medrol 60 every 8, given azithromycin 500 and taper  Sinus tachycardia-be related to asthma exacerbation/flare  Stage I CKD monitor labs   DVT prophylaxis: Lovenox Code Status: Full/Partial Family Communication: Discussed with wife at the bedside and she understands that he will need at least 24-48 hour stay. Given her a note for work and it is likely that he will need one as well Disposition Plan: Expect patient will need at least 2 minutes to completely improve   Consultants:     Procedures:     Antimicrobials:       Subjective:  Feels better but still short of breath No antecedent fever or chills No myalgias No rash  Objective: Vitals:   11/23/16 0647 11/23/16 0742 11/23/16 0756 11/23/16 0830  BP:   110/73   Pulse: 94  98   Resp: (!) 23  16   Temp:  98.4 F (36.9 C) 98.6 F (37 C)   TempSrc:  Oral Oral   SpO2: 96%  98% 99%  Weight:      Height:        Intake/Output Summary (Last 24 hours) at  11/23/16 1153 Last data filed at 11/23/16 0900  Gross per 24 hour  Intake          2039.17 ml  Output                0 ml  Net          2039.17 ml   Filed Weights   11/23/16 0137  Weight: 69.5 kg (153 lb 3.2 oz)    Examination:  General exam: Appears calm and comfortable  Respiratory system: Clear to auscultation. Respiratory effort normal. Cardiovascular system: S1 & S2 heard, RRR. No JVD Gastrointestinal system: Abdomen is nondistended, soft and nontender. No organomegaly or masses felt.Bowel normal Central nervous system: Alert and oriented. No focal neurological deficits. Extremities: Symmetric 5 x 5 power. Skin: No rashes, lesions or ulcers Psychiatry: Judgement and insight appear normal. Mood & affect appropriate.     Data Reviewed: I have personally reviewed following labs and imaging studies  CBC:  Recent Labs Lab 11/22/16 2134  WBC 15.0*  HGB 17.7*  HCT 49.7  MCV 91.5  PLT 121   Basic Metabolic Panel:  Recent Labs Lab 11/22/16 2134  NA 140  K 3.8  CL 103  CO2 24  GLUCOSE 168*  BUN 11  CREATININE 1.15  CALCIUM 9.2   GFR: Estimated Creatinine Clearance: 72.5 mL/min (by C-G formula based on SCr of 1.15 mg/dL). Liver Function Tests: No results for input(s): AST, ALT, ALKPHOS,  BILITOT, PROT, ALBUMIN in the last 168 hours. No results for input(s): LIPASE, AMYLASE in the last 168 hours. No results for input(s): AMMONIA in the last 168 hours. Coagulation Profile:  Recent Labs Lab 11/23/16 0441  INR 0.98   Cardiac Enzymes:  Recent Labs Lab 11/22/16 2134  TROPONINI <0.03   BNP (last 3 results) No results for input(s): PROBNP in the last 8760 hours. HbA1C: No results for input(s): HGBA1C in the last 72 hours. CBG: No results for input(s): GLUCAP in the last 168 hours. Lipid Profile: No results for input(s): CHOL, HDL, LDLCALC, TRIG, CHOLHDL, LDLDIRECT in the last 72 hours. Thyroid Function Tests: No results for input(s): TSH, T4TOTAL,  FREET4, T3FREE, THYROIDAB in the last 72 hours. Anemia Panel: No results for input(s): VITAMINB12, FOLATE, FERRITIN, TIBC, IRON, RETICCTPCT in the last 72 hours. Sepsis Labs:  Recent Labs Lab 11/23/16 0248 11/23/16 0441 11/23/16 0821  PROCALCITON  --  <0.10  --   LATICACIDVEN 3.2* 3.6* 3.7*    Recent Results (from the past 240 hour(s))  Culture, sputum-assessment     Status: None   Collection Time: 11/23/16  4:32 AM  Result Value Ref Range Status   Specimen Description EXPECTORATED SPUTUM  Final   Special Requests NONE  Final   Sputum evaluation THIS SPECIMEN IS ACCEPTABLE FOR SPUTUM CULTURE  Final   Report Status 11/23/2016 FINAL  Final         Radiology Studies: Dg Chest Portable 1 View  Result Date: 11/22/2016 CLINICAL DATA:  Shortness of breath tonight. EXAM: PORTABLE CHEST 1 VIEW COMPARISON:  08/14/2016 FINDINGS: The cardiomediastinal contours are normal. Mild hyperinflation. Pulmonary vasculature is normal. No consolidation, pleural effusion, or pneumothorax. No acute osseous abnormalities are seen. IMPRESSION: Mild hyperinflation without localizing abnormality. This can be seen with asthma, bronchitis or emphysema. Electronically Signed   By: Jeb Levering M.D.   On: 11/22/2016 23:42        Scheduled Meds: . [START ON 11/24/2016] azithromycin  250 mg Oral Daily  . dextromethorphan-guaiFENesin  1 tablet Oral BID  . enoxaparin (LOVENOX) injection  40 mg Subcutaneous Daily  . ipratropium-albuterol  3 mL Nebulization Q6H  . ipratropium-albuterol      . methylPREDNISolone (SOLU-MEDROL) injection  60 mg Intravenous Q8H  . montelukast  10 mg Oral QHS   Continuous Infusions: . sodium chloride 125 mL/hr at 11/23/16 0755     LOS: 0 days    Time spent: [place], [place], MD [facility] Pager [phone]  If 7PM-7AM, please contact night-coverage [email or web address] Password [facility] 11/23/2016, 11:53 AM

## 2016-11-23 NOTE — Progress Notes (Signed)
Patient arrived from the ED. CCMd called and notified. Pt. And wife Oriented to room, including call light, phone, hourly rounding, and cafeteria. Called RT d/t wheezing. Pt. Is also placed in 1L of 02 d/t low saturation. Will monitor.   11/23/16 0137  Vitals  Temp 98.8 F (37.1 C)  Temp Source Oral  BP 97/68  BP Location Left Arm  BP Method Automatic  Patient Position (if appropriate) Lying  Pulse Rate (!) 106  Pulse Rate Source Dinamap  ECG Heart Rate 76  Resp 16  Oxygen Therapy  SpO2 92 %  O2 Device Room Air  Pain Assessment  Pain Assessment No/denies pain  Height and Weight  Height 5' 3"  (1.6 m)  Weight 69.5 kg (153 lb 3.2 oz)  Type of Scale Used Bed  BSA (Calculated - sq m) 1.76 sq meters  BMI (Calculated) 27.2  Weight in (lb) to have BMI = 25 140.8

## 2016-11-23 NOTE — Progress Notes (Signed)
CRITICAL VALUE ALERT  Critical value received:  Lactic Acid 3.2  Date of notification:  11/23/16  Time of notification: 03:58 am  Critical value read back: yes  Nurse who received alert:  Kevan Ny   MD notified (1st page): Schorr, NP  Time of page: 4:06 am  MD notified (2nd page):  Dr. Blaine Hamper  Time of second page: 04:17 am  Responding MD:  Dr. Blaine Hamper  Time MD responded:  04:27 am

## 2016-11-23 NOTE — Progress Notes (Signed)
CRITICAL VALUE ALERT  Critical value received: Lactic Acid 3.6  Date of notification:  11/23/16  Time of notification:  05:38 am  Critical value read back: yes  Nurse who received alert:  Kevan Ny  MD notified (1st page):  Dr. Blaine Hamper  Time of first page:  05:41 am  MD notified (2nd page):  Time of second page:  Responding MD:  Dr. Blaine Hamper  Time MD responded:  05:44 am

## 2016-11-23 NOTE — H&P (Addendum)
History and Physical    Joseph Fitzgerald:[id number] DOB: 08-02-1971 DOA: 11/22/2016  Referring MD/NP/PA:   PCP: Minerva Ends, MD   Patient coming from:  The patient is coming from home.  At baseline, pt is independent for most of ADL.   Chief Complaint: Cough, shortness of breath, wheezing  HPI: Joseph Fitzgerald is a 45 y.o. male with medical history significant of asthma, eczema, presents with SOB, cough and wheezing.  Patient states that he start having shortness of breath, cough, wheezing earlier today, which has been progressively getting worse. He coughs up white clear mucus, no fever, chills or chest pain. Denies runny nose or sore throat. No nausea, vomiting, abdominal pain, diarrhea, symptoms or UTI or unilateral weakness. No history of intubation. He is speaking in full sentences.  ED Course: pt was found to have WBC 15.0, lactate 3.2, negative troponin, electrolytes renal function okay, chest x-rays showed hyperinflation without infiltration, temperature 99.3, tachycardia, tachypnea, oxygen saturation desaturation to 84% on room. Patient is placed on telemetry bed for observation.  Review of Systems:   General: no fevers, chills, no changes in body weight, has poor appetite, has fatigue HEENT: no blurry vision, hearing changes or sore throat Respiratory: has dyspnea, coughing, wheezing CV: no chest pain, no palpitations GI: no nausea, vomiting, abdominal pain, diarrhea, constipation GU: no dysuria, burning on urination, increased urinary frequency, hematuria  Ext: no leg edema Neuro: no unilateral weakness, numbness, or tingling, no vision change or hearing loss Skin: no rash, no skin tear. MSK: No muscle spasm, no deformity, no limitation of range of movement in spin Heme: No easy bruising.  Travel history: No recent long distant travel.  Allergy:  Allergies  Allergen Reactions  . Penicillins Rash    Past Medical History:  Diagnosis Date  . Asthma   . Shortness  of breath dyspnea     Past Surgical History:  Procedure Laterality Date  . NO PAST SURGERIES      Social History:  reports that he has never smoked. His smokeless tobacco use includes Chew. He reports that he drinks alcohol. He reports that he does not use drugs.  Family History:  Family History  Problem Relation Age of Onset  . Asthma Brother   . Diabetes Brother      Prior to Admission medications   Medication Sig Start Date End Date Taking? Authorizing Provider  albuterol (PROVENTIL HFA;VENTOLIN HFA) 108 (90 Base) MCG/ACT inhaler Inhale 2 puffs into the lungs every 4 (four) hours as needed for wheezing. 10/28/16  Yes Josalyn Funches, MD  budesonide-formoterol (SYMBICORT) 160-4.5 MCG/ACT inhaler Inhale 2 puffs into the lungs 2 (two) times daily. 10/28/16  Yes Josalyn Funches, MD  montelukast (SINGULAIR) 10 MG tablet Take 1 tablet (10 mg total) by mouth at bedtime. 10/28/16  Yes Josalyn Funches, MD  triamcinolone ointment (KENALOG) 0.5 % Apply 1 application topically 2 (two) times daily. Apply to neck rash Patient not taking: Reported on 11/22/2016 10/28/16   Boykin Nearing, MD    Physical Exam: Vitals:   11/23/16 0045 11/23/16 0100 11/23/16 0137 11/23/16 0214  BP: 113/76 105/77 97/68   Pulse: (!) 124 116 (!) 106   Resp: 16 15 16    Temp:   98.8 F (37.1 C)   TempSrc:   Oral   SpO2: 91% 95% 92% 95%  Weight:   69.5 kg (153 lb 3.2 oz)   Height:   5' 3"  (1.6 m)    General: Not in acute distress HEENT:  Eyes: PERRL, EOMI, no scleral icterus.       ENT: No discharge from the ears and nose, no pharynx injection, no tonsillar enlargement.        Neck: No JVD, no bruit, no mass felt. Heme: No neck lymph node enlargement. Cardiac: S1/S2, RRR, No murmurs, No gallops or rubs. Respiratory: has diffuse wheezing bilaterally. No rales or rubs. GI: Soft, nondistended, nontender, no rebound pain, no organomegaly, BS present. GU: No hematuria Ext: No pitting leg edema bilaterally.  2+DP/PT pulse bilaterally. Musculoskeletal: No joint deformities, No joint redness or warmth, no limitation of ROM in spin. Skin: No rashes.  Neuro: Alert, oriented X3, cranial nerves II-XII grossly intact, moves all extremities normally.  Psych: Patient is not psychotic, no suicidal or hemocidal ideation.  Labs on Admission: I have personally reviewed following labs and imaging studies  CBC:  Recent Labs Lab 11/22/16 2134  WBC 15.0*  HGB 17.7*  HCT 49.7  MCV 91.5  PLT 818   Basic Metabolic Panel:  Recent Labs Lab 11/22/16 2134  NA 140  K 3.8  CL 103  CO2 24  GLUCOSE 168*  BUN 11  CREATININE 1.15  CALCIUM 9.2   GFR: Estimated Creatinine Clearance: 72.5 mL/min (by C-G formula based on SCr of 1.15 mg/dL). Liver Function Tests: No results for input(s): AST, ALT, ALKPHOS, BILITOT, PROT, ALBUMIN in the last 168 hours. No results for input(s): LIPASE, AMYLASE in the last 168 hours. No results for input(s): AMMONIA in the last 168 hours. Coagulation Profile: No results for input(s): INR, PROTIME in the last 168 hours. Cardiac Enzymes:  Recent Labs Lab 11/22/16 2134  TROPONINI <0.03   BNP (last 3 results) No results for input(s): PROBNP in the last 8760 hours. HbA1C: No results for input(s): HGBA1C in the last 72 hours. CBG: No results for input(s): GLUCAP in the last 168 hours. Lipid Profile: No results for input(s): CHOL, HDL, LDLCALC, TRIG, CHOLHDL, LDLDIRECT in the last 72 hours. Thyroid Function Tests: No results for input(s): TSH, T4TOTAL, FREET4, T3FREE, THYROIDAB in the last 72 hours. Anemia Panel: No results for input(s): VITAMINB12, FOLATE, FERRITIN, TIBC, IRON, RETICCTPCT in the last 72 hours. Urine analysis: No results found for: COLORURINE, APPEARANCEUR, LABSPEC, PHURINE, GLUCOSEU, HGBUR, BILIRUBINUR, KETONESUR, PROTEINUR, UROBILINOGEN, NITRITE, LEUKOCYTESUR Sepsis Labs: @LABRCNTIP (procalcitonin:4,lacticidven:4) )No results found for this or any  previous visit (from the past 240 hour(s)).   Radiological Exams on Admission: Dg Chest Portable 1 View  Result Date: 11/22/2016 CLINICAL DATA:  Shortness of breath tonight. EXAM: PORTABLE CHEST 1 VIEW COMPARISON:  08/14/2016 FINDINGS: The cardiomediastinal contours are normal. Mild hyperinflation. Pulmonary vasculature is normal. No consolidation, pleural effusion, or pneumothorax. No acute osseous abnormalities are seen. IMPRESSION: Mild hyperinflation without localizing abnormality. This can be seen with asthma, bronchitis or emphysema. Electronically Signed   By: Jeb Levering M.D.   On: 11/22/2016 23:42     EKG: Independently reviewed. Sinus rhythm, tachycardia, QTC 421, no ischemic change.  Assessment/Plan Principal Problem:   Acute respiratory failure with hypoxia (HCC) Active Problems:   Leukocytosis   Asthma exacerbation   Sepsis ([place])   Acute respiratory failure with hypoxia due to asthma exacerbation: No infiltration on chest x-ray. -will place on tele bed for obs -start Z pak -Urine S. pneumococcal antigen -Nebulizers: Scheduled Atrovent and prn Xopenex nebs -Solu-Medrol 60 mg IV q8h  -Mucinex for cough  -Follow up blood culture x2, sputum culture, respiratory virus panel, Flu pcr  Sepsis: Patient meets criteria for sepsis with leukocytosis, elevated  lactate, tachycardia and tachypnea. Currently hemodynamically stable. -will get Procalcitonin and trend lactic acid levels per sepsis protocol. -on Z-Pak as above -IVF: 2.5L + 1L of NS bolus in ED, followed by 125 cc/h  -f/u blood culture   DVT ppx:  SQ Lovenox Code Status: Full code Family Communication: Yes, patient's wife and son at bed side Disposition Plan:  Anticipate discharge back to previous home environment Consults called:  none Admission status: Obs / tele    Date of Service 11/23/2016    [name] [facility] Pager [phone]  If 7PM-7AM, please contact  night-coverage [email or web address] Password [facility] 11/23/2016, 4:38 AM

## 2016-11-23 NOTE — ED Provider Notes (Signed)
Patient presented to the ER with shortness of breath  Face to face Exam: HEENT - PERRLA Lungs - tachypnea, increased accessory muscle use, diminished breath sounds with wheezing Heart - RRR, no M/R/G Abd - S/NT/ND Neuro - alert, oriented x3  Plan: Patient only showing minimal improvement with aggressive therapy, will require hospitalization for further management.   Orpah Greek, MD 11/23/16 [phone]

## 2016-11-23 NOTE — Progress Notes (Signed)
   11/23/16 1400  Clinical Encounter Type  Visited With Patient and family together  Visit Type Other (Comment) ([place] consult)  Spiritual Encounters  Spiritual Needs Emotional  Stress Factors  Patient Stress Factors Health changes  Family Stress Factors Family relationships  Introduction to Pt and family. Provided copy of AD and gave explanation. Pt and family will discuss. Follow up tomorrow.

## 2016-11-24 NOTE — Progress Notes (Signed)
PROGRESS NOTE    Joseph Fitzgerald  NOM:[id number] DOB: [birth year] DOA: 11/22/2016 PCP: Minerva Ends, MD    Brief Narrative:   45 y/o ? Known chronic severe asthma Multiple hospitalizations- last 07/2016 Family history diabetes mellitus  Admitted from emergency room with severe shortness of breath wheezing white mucus White count 15 lack to 3. 2 chest x-ray concerning for hyperinflation without infiltrates tachycardic   Assessment & Plan:   Principal Problem:   Acute respiratory failure with hypoxia (HCC) Active Problems:   Leukocytosis   Asthma exacerbation   Sepsis ([place])   Sepsis possibly secondary to acute viral illness--Rhinovirus-chest x-ray is negative. Lactic acid is elevated however. Patient's blood pressures are stable but low normal-received 3 L bolus and is currently on IV fluids 1 25 cc/h.  Clinically this does not appear to be a pulmonary embolism-- strep pneumo urinary antigen is neg  Acute exacerbation of moderate persistent asthma-continue Solu-Medrol 60 every 8, given azithromycin 500 and taper  Sinus tachycardia-be related to asthma exacerbation/flare  Stage I CKD monitor labs   DVT prophylaxis: Lovenox Code Status: Full/Partial Family Communication: Discussed with wife at the bedside and she understands that he will need at least 24-48 hour stay. Given her a note for work and it is likely that he will need one as well Disposition Plan: Expect patient will need at least 2 minutes to completely improve   Consultants:     Procedures:     Antimicrobials:       Subjective:  Well no new issue Eating and drinking Still wheezy but feels a little better  still has some cough   Objective: Vitals:   11/24/16 0500 11/24/16 0828 11/24/16 0840 11/24/16 1353  BP: 122/66 122/71    Pulse: 61 68    Resp: 20 18    Temp: 98.3 F (36.8 C) 97.9 F (36.6 C)    TempSrc: Oral Oral    SpO2: 98% 100% 99% 96%  Weight:      Height:         Intake/Output Summary (Last 24 hours) at 11/24/16 1359 Last data filed at 11/24/16 0831  Gross per 24 hour  Intake          3039.58 ml  Output             1150 ml  Net          1889.58 ml   Filed Weights   11/23/16 0137  Weight: 69.5 kg (153 lb 3.2 oz)    Examination:  General exam: Appears calm and comfortable  Respiratory system: Clear to auscultation. Respiratory effort normal. Cardiovascular system: S1 & S2 heard, RRR. No JVD Gastrointestinal system: Abdomen is nondistended, soft and nontender. No organomegaly or masses felt.Bowel normal Central nervous system: Alert and oriented. No focal neurological deficits. Extremities: Symmetric 5 x 5 power. Skin: No rashes, lesions or ulcers Psychiatry: Judgement and insight appear normal. Mood & affect appropriate.     Data Reviewed: I have personally reviewed following labs and imaging studies  CBC:  Recent Labs Lab 11/22/16 2134  WBC 15.0*  HGB 17.7*  HCT 49.7  MCV 91.5  PLT 962   Basic Metabolic Panel:  Recent Labs Lab 11/22/16 2134  NA 140  K 3.8  CL 103  CO2 24  GLUCOSE 168*  BUN 11  CREATININE 1.15  CALCIUM 9.2   GFR: Estimated Creatinine Clearance: 72.5 mL/min (by C-G formula based on SCr of 1.15 mg/dL). Liver Function Tests: No results for input(s):  AST, ALT, ALKPHOS, BILITOT, PROT, ALBUMIN in the last 168 hours. No results for input(s): LIPASE, AMYLASE in the last 168 hours. No results for input(s): AMMONIA in the last 168 hours. Coagulation Profile:  Recent Labs Lab 11/23/16 0441  INR 0.98   Cardiac Enzymes:  Recent Labs Lab 11/22/16 2134  TROPONINI <0.03   BNP (last 3 results) No results for input(s): PROBNP in the last 8760 hours. HbA1C: No results for input(s): HGBA1C in the last 72 hours. CBG: No results for input(s): GLUCAP in the last 168 hours. Lipid Profile: No results for input(s): CHOL, HDL, LDLCALC, TRIG, CHOLHDL, LDLDIRECT in the last 72 hours. Thyroid Function  Tests: No results for input(s): TSH, T4TOTAL, FREET4, T3FREE, THYROIDAB in the last 72 hours. Anemia Panel: No results for input(s): VITAMINB12, FOLATE, FERRITIN, TIBC, IRON, RETICCTPCT in the last 72 hours. Sepsis Labs:  Recent Labs Lab 11/23/16 0248 11/23/16 0441 11/23/16 0821  PROCALCITON  --  <0.10  --   LATICACIDVEN 3.2* 3.6* 3.7*    Recent Results (from the past 240 hour(s))  Culture, blood (routine x 2) Call MD if unable to obtain prior to antibiotics being given     Status: None (Preliminary result)   Collection Time: 11/23/16  2:41 AM  Result Value Ref Range Status   Specimen Description BLOOD RIGHT ARM  Final   Special Requests BOTTLES DRAWN AEROBIC AND ANAEROBIC 5ML  Final   Culture NO GROWTH 1 DAY  Final   Report Status PENDING  Incomplete  Culture, blood (routine x 2) Call MD if unable to obtain prior to antibiotics being given     Status: None (Preliminary result)   Collection Time: 11/23/16  2:45 AM  Result Value Ref Range Status   Specimen Description BLOOD RIGHT HAND  Final   Special Requests IN PEDIATRIC BOTTLE 3ML  Final   Culture NO GROWTH 1 DAY  Final   Report Status PENDING  Incomplete  Culture, sputum-assessment     Status: None   Collection Time: 11/23/16  4:32 AM  Result Value Ref Range Status   Specimen Description EXPECTORATED SPUTUM  Final   Special Requests NONE  Final   Sputum evaluation THIS SPECIMEN IS ACCEPTABLE FOR SPUTUM CULTURE  Final   Report Status 11/23/2016 FINAL  Final  Culture, respiratory (NON-Expectorated)     Status: None (Preliminary result)   Collection Time: 11/23/16  4:32 AM  Result Value Ref Range Status   Specimen Description EXPECTORATED SPUTUM  Final   Special Requests NONE Reflexed from [id number]  Final   Gram Stain   Final    FEW WBC PRESENT, PREDOMINANTLY PMN ABUNDANT GRAM POSITIVE COCCI IN CHAINS MODERATE GRAM NEGATIVE RODS RARE SQUAMOUS EPITHELIAL CELLS PRESENT    Culture CULTURE REINCUBATED FOR BETTER GROWTH   Final   Report Status PENDING  Incomplete  Respiratory Panel by PCR     Status: Abnormal   Collection Time: 11/23/16  5:08 AM  Result Value Ref Range Status   Adenovirus NOT DETECTED NOT DETECTED Final   Coronavirus 229E NOT DETECTED NOT DETECTED Final   Coronavirus HKU1 NOT DETECTED NOT DETECTED Final   Coronavirus NL63 NOT DETECTED NOT DETECTED Final   Coronavirus OC43 NOT DETECTED NOT DETECTED Final   Metapneumovirus NOT DETECTED NOT DETECTED Final   Rhinovirus / Enterovirus DETECTED (A) NOT DETECTED Final   Influenza A NOT DETECTED NOT DETECTED Final   Influenza B NOT DETECTED NOT DETECTED Final   Parainfluenza Virus 1 NOT DETECTED NOT DETECTED Final  Parainfluenza Virus 2 NOT DETECTED NOT DETECTED Final   Parainfluenza Virus 3 NOT DETECTED NOT DETECTED Final   Parainfluenza Virus 4 NOT DETECTED NOT DETECTED Final   Respiratory Syncytial Virus NOT DETECTED NOT DETECTED Final   Bordetella pertussis NOT DETECTED NOT DETECTED Final   Chlamydophila pneumoniae NOT DETECTED NOT DETECTED Final   Mycoplasma pneumoniae NOT DETECTED NOT DETECTED Final         Radiology Studies: Dg Chest Portable 1 View  Result Date: 11/22/2016 CLINICAL DATA:  Shortness of breath tonight. EXAM: PORTABLE CHEST 1 VIEW COMPARISON:  08/14/2016 FINDINGS: The cardiomediastinal contours are normal. Mild hyperinflation. Pulmonary vasculature is normal. No consolidation, pleural effusion, or pneumothorax. No acute osseous abnormalities are seen. IMPRESSION: Mild hyperinflation without localizing abnormality. This can be seen with asthma, bronchitis or emphysema. Electronically Signed   By: Jeb Levering M.D.   On: 11/22/2016 23:42        Scheduled Meds: . azithromycin  250 mg Oral Daily  . dextromethorphan-guaiFENesin  1 tablet Oral BID  . enoxaparin (LOVENOX) injection  40 mg Subcutaneous Daily  . ipratropium-albuterol  3 mL Nebulization Q6H  . methylPREDNISolone (SOLU-MEDROL) injection  60 mg  Intravenous Q8H  . montelukast  10 mg Oral QHS   Continuous Infusions: . sodium chloride 125 mL/hr at 11/24/16 0831     LOS: 1 day    Time spent: [place], [place], MD [facility] Pager [phone]  If 7PM-7AM, please contact night-coverage [email or web address] Password [email or web address] 11/24/2016, 1:59 PM

## 2016-11-24 NOTE — Progress Notes (Signed)
   11/24/16 1310  Clinical Encounter Type  Visited With Patient  Visit Type Follow-up  Spiritual Encounters  Spiritual Needs Emotional  Stress Factors  Patient Stress Factors None identified  Follow up on Advanced Directive. Pt not ready to have it completed. Be available as requested.

## 2016-11-25 ENCOUNTER — Encounter: Payer: Self-pay | Admitting: Family Medicine

## 2016-11-25 LAB — CULTURE, RESPIRATORY W GRAM STAIN: Culture: NORMAL

## 2016-11-25 LAB — CULTURE, RESPIRATORY

## 2016-11-25 MED ORDER — AZITHROMYCIN 250 MG PO TABS: ORAL_TABLET | ORAL | 0 refills | Status: DC

## 2016-11-25 MED ORDER — PREDNISONE 20 MG PO TABS: 60.0000 mg | ORAL_TABLET | Freq: Every day | ORAL | Status: DC

## 2016-11-25 MED ORDER — PREDNISONE 20 MG PO TABS
60.0000 mg | ORAL_TABLET | Freq: Every day | ORAL | Status: DC
  Administered 2016-11-26: 60 mg via ORAL
  Filled 2016-11-25: qty 3

## 2016-11-25 MED ORDER — PREDNISONE 20 MG PO TABS
60.0000 mg | ORAL_TABLET | Freq: Every day | ORAL | 0 refills | Status: DC
Start: 2016-11-25 — End: 2016-11-26

## 2016-11-25 MED FILL — predniSONE 20 MG TABS: 20 | 5 days supply | Qty: 15 | Fill #0

## 2016-11-25 MED FILL — ?AZITHROMYCIN 250 MG TABLET: 250 | 2 days supply | Qty: 2 | Fill #0

## 2016-11-25 NOTE — Discharge Summary (Signed)
Physician Discharge Summary  Joseph Fitzgerald NTI:[id number] DOB: [birth year] DOA: 11/22/2016  PCP: Minerva Ends, MD  Admit date: 11/22/2016 Discharge date: 11/25/2016  Time spent: 34 minutes  Recommendations for Outpatient Follow-up:  1. Patient will need a burst of steroids for 5 days ending on 12/01/2015 2. Continue azithromycin until 11/27/16 3. Patient should consider outpatient allergist follow-up if can afford the same  Discharge Diagnoses:  Principal Problem:   Acute respiratory failure with hypoxia ([place]) Active Problems:   Leukocytosis   Asthma exacerbation   Sepsis ([place])   Discharge Condition: Improved  Diet recommendation: Heart healthy low-salt  Filed Weights   11/23/16 0137  Weight: 69.5 kg (153 lb 3.2 oz)    History of present illness:  45 y/o ? Known chronic severe asthmaAlong with severe eczema/atopy Multiple hospitalizations- last 07/2016 Family history diabetes mellitus  Admitted from emergency room with severe shortness of breath wheezing white mucus White count 15 lactic acid 3. 2 chest x-ray concerning for hyperinflation without infiltrates tachycardic   Hospital Course:   Sepsis possibly secondary to acute viral illness--Rhinovirus-chest x-ray is negative. Lactic acid was elevated however. Patient's blood pressures are stable but low normal-received 3 L bolus discontinued on 11/25/16 IV fluids 1 25 cc/h.  Clinically this does not appear to be a pulmonary embolism-- strep pneumo urinary antigen is neg  Acute exacerbation of moderate persistent asthma-continue Solu-Medrol 60 every 8--> transitioned on 12 2917 2 prednisone for another 5 days ending 1 3 2017  given azithromycin 500 and 250 mg dosing until 1231  Sinus tachycardia-be related to asthma exacerbation/flare  Stage I CKD monitor labs  Procedures:  X-ray  Consultations:  none  Discharge Exam: Vitals:   11/25/16 0755 11/25/16 1256  BP: 125/80 (!) 141/89  Pulse: 67 87   Resp: 18 19  Temp: 98.3 F (36.8 C) 98.2 F (36.8 C)    General: eomi ncat Cardiovascular: s1 s 2no m/r/g Respiratory: cta b, no added sound neurologically intact  Discharge Instructions   Discharge Instructions    Diet - low sodium heart healthy    Complete by:  As directed    Discharge instructions    Complete by:  As directed    Please complete course of steroids and antibiotics as per medication list We will provide a note for work for you excusing you until 11-30-2015 These follow-up with your regular doctor   Increase activity slowly    Complete by:  As directed      Current Discharge Medication List    START taking these medications   Details  azithromycin (ZITHROMAX) 250 MG tablet Complete course of therapy Qty: 2 each, Refills: 0    predniSONE (DELTASONE) 20 MG tablet Take 3 tablets (60 mg total) by mouth daily before breakfast. Qty: 15 tablet, Refills: 0      CONTINUE these medications which have NOT CHANGED   Details  albuterol (PROVENTIL HFA;VENTOLIN HFA) 108 (90 Base) MCG/ACT inhaler Inhale 2 puffs into the lungs every 4 (four) hours as needed for wheezing. Qty: 1 Inhaler, Refills: 11   Associated Diagnoses: Moderate persistent asthma with acute exacerbation    budesonide-formoterol (SYMBICORT) 160-4.5 MCG/ACT inhaler Inhale 2 puffs into the lungs 2 (two) times daily. Qty: 1 Inhaler, Refills: 12   Associated Diagnoses: Moderate persistent asthma with acute exacerbation    montelukast (SINGULAIR) 10 MG tablet Take 1 tablet (10 mg total) by mouth at bedtime. Qty: 30 tablet, Refills: 11   Associated Diagnoses: Moderate persistent asthma with acute exacerbation  triamcinolone ointment (KENALOG) 0.5 % Apply 1 application topically 2 (two) times daily. Apply to neck rash Qty: 30 g, Refills: 3   Associated Diagnoses: Flexural eczema       Allergies  Allergen Reactions  . Penicillins Rash      The results of significant diagnostics from this  hospitalization (including imaging, microbiology, ancillary and laboratory) are listed below for reference.    Significant Diagnostic Studies: Dg Chest Portable 1 View  Result Date: 11/22/2016 CLINICAL DATA:  Shortness of breath tonight. EXAM: PORTABLE CHEST 1 VIEW COMPARISON:  08/14/2016 FINDINGS: The cardiomediastinal contours are normal. Mild hyperinflation. Pulmonary vasculature is normal. No consolidation, pleural effusion, or pneumothorax. No acute osseous abnormalities are seen. IMPRESSION: Mild hyperinflation without localizing abnormality. This can be seen with asthma, bronchitis or emphysema. Electronically Signed   By: Jeb Levering M.D.   On: 11/22/2016 23:42    Microbiology: Recent Results (from the past 240 hour(s))  Culture, blood (routine x 2) Call MD if unable to obtain prior to antibiotics being given     Status: None (Preliminary result)   Collection Time: 11/23/16  2:41 AM  Result Value Ref Range Status   Specimen Description BLOOD RIGHT ARM  Final   Special Requests BOTTLES DRAWN AEROBIC AND ANAEROBIC 5ML  Final   Culture NO GROWTH 2 DAYS  Final   Report Status PENDING  Incomplete  Culture, blood (routine x 2) Call MD if unable to obtain prior to antibiotics being given     Status: None (Preliminary result)   Collection Time: 11/23/16  2:45 AM  Result Value Ref Range Status   Specimen Description BLOOD RIGHT HAND  Final   Special Requests IN PEDIATRIC BOTTLE 3ML  Final   Culture NO GROWTH 2 DAYS  Final   Report Status PENDING  Incomplete  Culture, sputum-assessment     Status: None   Collection Time: 11/23/16  4:32 AM  Result Value Ref Range Status   Specimen Description EXPECTORATED SPUTUM  Final   Special Requests NONE  Final   Sputum evaluation THIS SPECIMEN IS ACCEPTABLE FOR SPUTUM CULTURE  Final   Report Status 11/23/2016 FINAL  Final  Culture, respiratory (NON-Expectorated)     Status: None   Collection Time: 11/23/16  4:32 AM  Result Value Ref Range  Status   Specimen Description EXPECTORATED SPUTUM  Final   Special Requests NONE Reflexed from [id number]  Final   Gram Stain   Final    FEW WBC PRESENT, PREDOMINANTLY PMN ABUNDANT GRAM POSITIVE COCCI IN CHAINS MODERATE GRAM NEGATIVE RODS RARE SQUAMOUS EPITHELIAL CELLS PRESENT    Culture Consistent with normal respiratory flora.  Final   Report Status 11/25/2016 FINAL  Final  Respiratory Panel by PCR     Status: Abnormal   Collection Time: 11/23/16  5:08 AM  Result Value Ref Range Status   Adenovirus NOT DETECTED NOT DETECTED Final   Coronavirus 229E NOT DETECTED NOT DETECTED Final   Coronavirus HKU1 NOT DETECTED NOT DETECTED Final   Coronavirus NL63 NOT DETECTED NOT DETECTED Final   Coronavirus OC43 NOT DETECTED NOT DETECTED Final   Metapneumovirus NOT DETECTED NOT DETECTED Final   Rhinovirus / Enterovirus DETECTED (A) NOT DETECTED Final   Influenza A NOT DETECTED NOT DETECTED Final   Influenza B NOT DETECTED NOT DETECTED Final   Parainfluenza Virus 1 NOT DETECTED NOT DETECTED Final   Parainfluenza Virus 2 NOT DETECTED NOT DETECTED Final   Parainfluenza Virus 3 NOT DETECTED NOT DETECTED Final   Parainfluenza  Virus 4 NOT DETECTED NOT DETECTED Final   Respiratory Syncytial Virus NOT DETECTED NOT DETECTED Final   Bordetella pertussis NOT DETECTED NOT DETECTED Final   Chlamydophila pneumoniae NOT DETECTED NOT DETECTED Final   Mycoplasma pneumoniae NOT DETECTED NOT DETECTED Final     Labs: Basic Metabolic Panel:  Recent Labs Lab 11/22/16 2134  NA 140  K 3.8  CL 103  CO2 24  GLUCOSE 168*  BUN 11  CREATININE 1.15  CALCIUM 9.2   Liver Function Tests: No results for input(s): AST, ALT, ALKPHOS, BILITOT, PROT, ALBUMIN in the last 168 hours. No results for input(s): LIPASE, AMYLASE in the last 168 hours. No results for input(s): AMMONIA in the last 168 hours. CBC:  Recent Labs Lab 11/22/16 2134  WBC 15.0*  HGB 17.7*  HCT 49.7  MCV 91.5  PLT 281   Cardiac  Enzymes:  Recent Labs Lab 11/22/16 2134  TROPONINI <0.03   BNP: BNP (last 3 results) No results for input(s): BNP in the last 8760 hours.  ProBNP (last 3 results) No results for input(s): PROBNP in the last 8760 hours.  CBG: No results for input(s): GLUCAP in the last 168 hours.     SignedNita Sells MD   [facility] 11/25/2016, 3:59 PM

## 2016-11-25 NOTE — Care Management Note (Signed)
Case Management Note  Patient Details  Name: Joseph Fitzgerald MRN: [id number] Date of Birth: Dec 18, 1970  Subjective/Objective:  Asthma exacerbation, Sepsis                  Action/Plan: Discharge Planning: AVS reviewed: NCM spoke to pt and provided him with goodrx coupon to obtain Zithromax $6 and Prednisone $4 at [facility]. [facility] pharmacy is closed on weekend. Pt will contact [place] to arrange appt. His last appt was on 10/28/2016.  PCP Boykin Nearing MD  Expected Discharge Date:                  Expected Discharge Plan:  Home/Self Care  In-House Referral:  NA  Discharge planning Services  CM Consult, [facility], Medication Assistance  Post Acute Care Choice:  NA Choice offered to:  NA  DME Arranged:  N/A DME Agency:  NA  HH Arranged:  NA HH Agency:  NA  Status of Service:  Completed, signed off  If discussed at [place] of Stay Meetings, dates discussed:    Additional Comments:  Joseph Rasher, RN 11/25/2016, 6:10 PM

## 2016-11-26 MED ORDER — GUAIFENESIN ER 600 MG PO TB12: 600.0000 mg | ORAL_TABLET | Freq: Two times a day (BID) | ORAL | 0 refills | Status: DC

## 2016-11-26 MED ORDER — PREDNISONE 10 MG PO TABS: ORAL_TABLET | ORAL | 0 refills | Status: DC

## 2016-11-28 LAB — CULTURE, BLOOD (ROUTINE X 2)
CULTURE: NO GROWTH
Culture: NO GROWTH

## 2016-11-29 MED FILL — ?PREDNISONE 10 MG TABLET: 10 | 15 days supply | Qty: 45 | Fill #0

## 2016-11-30 ENCOUNTER — Ambulatory Visit: Payer: Self-pay | Attending: Critical Care Medicine | Admitting: Critical Care Medicine

## 2016-11-30 ENCOUNTER — Encounter: Payer: Self-pay | Admitting: Critical Care Medicine

## 2016-11-30 VITALS — BP 153/105 | HR 59 | Temp 97.9°F | Resp 16 | Wt 167.8 lb

## 2016-11-30 DIAGNOSIS — J4541 Moderate persistent asthma with (acute) exacerbation: Secondary | ICD-10-CM | POA: Insufficient documentation

## 2016-11-30 DIAGNOSIS — J45901 Unspecified asthma with (acute) exacerbation: Secondary | ICD-10-CM

## 2016-11-30 DIAGNOSIS — Z7951 Long term (current) use of inhaled steroids: Secondary | ICD-10-CM | POA: Insufficient documentation

## 2016-11-30 DIAGNOSIS — Z9109 Other allergy status, other than to drugs and biological substances: Secondary | ICD-10-CM

## 2016-11-30 MED ORDER — BUDESONIDE-FORMOTEROL FUMARATE 160-4.5 MCG/ACT IN AERO: 2.0000 | INHALATION_SPRAY | Freq: Two times a day (BID) | RESPIRATORY_TRACT | 12 refills | Status: DC

## 2016-11-30 MED ORDER — MONTELUKAST SODIUM 10 MG PO TABS: 10.0000 mg | ORAL_TABLET | Freq: Every day | ORAL | 11 refills | Status: DC

## 2016-11-30 MED ORDER — PREDNISONE 10 MG PO TABS: ORAL_TABLET | ORAL | 0 refills | Status: DC

## 2016-11-30 MED ORDER — ALBUTEROL SULFATE HFA 108 (90 BASE) MCG/ACT IN AERS: 2.0000 | INHALATION_SPRAY | RESPIRATORY_TRACT | 11 refills | Status: DC | PRN

## 2016-11-30 MED ORDER — GUAIFENESIN ER 600 MG PO TB12: 600.0000 mg | ORAL_TABLET | Freq: Two times a day (BID) | ORAL | 6 refills | Status: DC

## 2016-11-30 MED FILL — SYMBICORT 160-4.5 MCG INH: 160-4.5 | 20 days supply | Qty: 10 | Fill #0

## 2016-11-30 MED FILL — !VENTOLIN HFA INHALER: 108 (90 BAS | 20 days supply | Qty: 18 | Fill #0

## 2016-11-30 MED FILL — MONTELUKAST SOD 10 MG TAB: 10 | 30 days supply | Qty: 30 | Fill #0

## 2016-11-30 NOTE — Progress Notes (Signed)
Subjective:    Patient ID: Joseph Fitzgerald, male    DOB: January 19, 1971, 46 y.o.   MRN: [id number]  47 y.o. AM PMHx chronic asthma several prior hospitalizations over the past 3 years. And continued repeat ED / hosp stays this past 6 months (2) Hx of asthma.  Started in 20s.    Last seen 07/2016 then adm 11/22/16- 12/29 with viral illness, sepsis, hypotension, CXR neg, asthma flare.  D/c with steroid pulse and azithromycin. Rhinovirus pos  All other cult neg Now is better.   Now is better      Asthma  He complains of cough, hemoptysis and sputum production. There is no chest tightness, difficulty breathing, frequent throat clearing, shortness of breath or wheezing. Primary symptoms comments: Nasal drainage. This is a recurrent problem. The current episode started more than 1 year ago. The problem occurs daily. The problem has been rapidly improving. The cough is productive of sputum, productive of blood-tinged sputum and productive of brown sputum. Associated symptoms include heartburn, postnasal drip and rhinorrhea. Pertinent negatives include no appetite change, chest pain, dyspnea on exertion, ear congestion, ear pain, fever, headaches, malaise/fatigue, myalgias, nasal congestion, orthopnea, PND, sneezing, sore throat or trouble swallowing. His symptoms are aggravated by change in weather, exposure to smoke, pollen and URI. His symptoms are alleviated by steroid inhaler, beta-agonist and oral steroids (abx). His past medical history is significant for asthma. There is no history of bronchitis, COPD, emphysema or pneumonia.      Review of Systems  Constitutional: Negative for appetite change, fever and malaise/fatigue.  HENT: Positive for postnasal drip and rhinorrhea. Negative for ear pain, sneezing, sore throat and trouble swallowing.   Respiratory: Positive for cough, hemoptysis and sputum production. Negative for shortness of breath and wheezing.   Cardiovascular: Negative for chest pain,  dyspnea on exertion and PND.  Gastrointestinal: Positive for heartburn.  Musculoskeletal: Negative for myalgias.  Neurological: Negative for headaches.       Objective:   Physical Exam Vitals:   11/30/16 0954  BP: (!) 153/105  Pulse: (!) 59  Resp: 16  Temp: 97.9 F (36.6 C)  TempSrc: Oral  SpO2: 96%  Weight: 167 lb 12.8 oz (76.1 kg)    Gen: Pleasant, well-nourished, in no distress,  normal affect  ENT: No lesions,  mouth clear,  oropharynx clear, no postnasal drip  Neck: No JVD, no TMG, no carotid bruits  Lungs: No use of accessory muscles, no dullness to percussion, insp exp wheezes  Cardiovascular: RRR, heart sounds normal, no murmur or gallops, no peripheral edema  Abdomen: soft and NT, no HSM,  BS normal  Musculoskeletal: No deformities, no cyanosis or clubbing  Neuro: alert, non focal  Skin: Warm, no lesions or rashes  No results found.    CXR normal  Rhinovirus pos on viral study      Assessment & Plan:  I personally reviewed all images and lab data in the [facility] system as well as any outside material available during this office visit and agree with the  radiology impressions.   Asthma exacerbation Mod persistent asthma with flare  Plan prednisone 28m Take 4 for three days 3 for three days 2 for three days 1 for three days and stop Cont symbicort HFA technique training improved from 30% to 90%   Environmental allergies No ability to afford allergy eval Refill singulair    [place] seen today for hospitalization follow-up.  Diagnoses and all orders for this visit:  Severe asthma with exacerbation,  unspecified whether persistent  Moderate persistent asthma with acute exacerbation -     montelukast (SINGULAIR) 10 MG tablet; Take 1 tablet (10 mg total) by mouth at bedtime. -     budesonide-formoterol (SYMBICORT) 160-4.5 MCG/ACT inhaler; Inhale 2 puffs into the lungs 2 (two) times daily. -     albuterol (PROVENTIL HFA;VENTOLIN HFA) 108 (90  Base) MCG/ACT inhaler; Inhale 2 puffs into the lungs every 4 (four) hours as needed for wheezing.  Environmental allergies  Other orders -     predniSONE (DELTASONE) 10 MG tablet; Take 4 for three days 3 for three days 2 for three days 1 for three days and stop -     guaiFENesin (MUCINEX) 600 MG 12 hr tablet; Take 1 tablet (600 mg total) by mouth 2 (two) times daily.

## 2016-11-30 NOTE — Assessment & Plan Note (Signed)
Mod persistent asthma with flare  Plan prednisone 75m Take 4 for three days 3 for three days 2 for three days 1 for three days and stop Cont symbicort HFA technique training improved from 30% to 90%

## 2016-11-30 NOTE — Patient Instructions (Signed)
Take another round of prednisone 72m Take 4 for three days 3 for three days 2 for three days 1 for three days and stop Refills sent to pharmacy Work on inhaler technique Return 3 months

## 2016-11-30 NOTE — Assessment & Plan Note (Addendum)
No ability to afford allergy eval Refill singulair

## 2017-01-13 MED FILL — !SYMBICORT 160-4.5 MCG INH: 160-4.5 | 15 days supply | Qty: 1 | Fill #1

## 2017-03-10 ENCOUNTER — Encounter: Payer: Self-pay | Admitting: Family Medicine

## 2017-03-10 ENCOUNTER — Ambulatory Visit: Payer: Self-pay | Attending: Family Medicine | Admitting: Family Medicine

## 2017-03-10 VITALS — BP 137/90 | HR 72 | Temp 98.6°F | Wt 165.8 lb

## 2017-03-10 DIAGNOSIS — R21 Rash and other nonspecific skin eruption: Secondary | ICD-10-CM | POA: Insufficient documentation

## 2017-03-10 DIAGNOSIS — Z9109 Other allergy status, other than to drugs and biological substances: Secondary | ICD-10-CM

## 2017-03-10 DIAGNOSIS — L2082 Flexural eczema: Secondary | ICD-10-CM | POA: Insufficient documentation

## 2017-03-10 DIAGNOSIS — Z79899 Other long term (current) drug therapy: Secondary | ICD-10-CM | POA: Insufficient documentation

## 2017-03-10 DIAGNOSIS — J4541 Moderate persistent asthma with (acute) exacerbation: Secondary | ICD-10-CM | POA: Insufficient documentation

## 2017-03-10 DIAGNOSIS — Z7951 Long term (current) use of inhaled steroids: Secondary | ICD-10-CM | POA: Insufficient documentation

## 2017-03-10 MED ORDER — KETOTIFEN FUMARATE 0.025 % OP SOLN
1.0000 [drp] | Freq: Two times a day (BID) | OPHTHALMIC | 3 refills | Status: DC
Start: 2017-03-10 — End: 2018-03-17

## 2017-03-10 MED ORDER — TRIAMCINOLONE ACETONIDE 0.5 % EX OINT: 1.0000 "application " | TOPICAL_OINTMENT | Freq: Two times a day (BID) | CUTANEOUS | 5 refills | Status: DC

## 2017-03-10 MED ORDER — FLUTICASONE PROPIONATE 50 MCG/ACT NA SUSP: 2.0000 | Freq: Every day | NASAL | 6 refills | Status: DC

## 2017-03-10 MED ORDER — MONTELUKAST SODIUM 10 MG PO TABS: 10.0000 mg | ORAL_TABLET | Freq: Every day | ORAL | 11 refills | Status: DC

## 2017-03-10 MED ORDER — CETIRIZINE HCL 10 MG PO TABS: 10.0000 mg | ORAL_TABLET | Freq: Every day | ORAL | 11 refills | Status: DC

## 2017-03-10 MED ORDER — BUDESONIDE-FORMOTEROL FUMARATE 160-4.5 MCG/ACT IN AERO: 2.0000 | INHALATION_SPRAY | Freq: Two times a day (BID) | RESPIRATORY_TRACT | 12 refills | Status: DC

## 2017-03-10 MED ORDER — ALBUTEROL SULFATE HFA 108 (90 BASE) MCG/ACT IN AERS: 2.0000 | INHALATION_SPRAY | RESPIRATORY_TRACT | 11 refills | Status: DC | PRN

## 2017-03-10 MED FILL — SM EYE ITCH RELIEF 0.025% D: 0.025 | 40 days supply | Qty: 5 | Fill #0

## 2017-03-10 MED FILL — ?CETIRIZINE HCL 10 MG TABLE: 10 | 30 days supply | Qty: 30 | Fill #0

## 2017-03-10 MED FILL — !VENTOLIN HFA INHALER: 108 (90 BAS | 25 days supply | Qty: 18 | Fill #0

## 2017-03-10 MED FILL — !SYMBICORT 160-4.5 MCG INH: 160-4.5 | 30 days supply | Qty: 1 | Fill #0

## 2017-03-10 MED FILL — FLUTICASONE PROP 50 MCG SPR: 50 | 30 days supply | Qty: 16 | Fill #0

## 2017-03-10 MED FILL — TRIAMCINOLONE 0.5% OINTMENT: 0.5 | 10 days supply | Qty: 30 | Fill #0

## 2017-03-10 MED FILL — MONTELUKAST SOD 10 MG TAB: 10 | 30 days supply | Qty: 30 | Fill #0

## 2017-03-10 NOTE — Progress Notes (Addendum)
Subjective:  Patient ID: Joseph Fitzgerald, male    DOB: Dec 05, 1970  Age: 46 y.o. MRN: [id number]  CC: Asthma   HPI Joseph Fitzgerald presents for   1. Asthma: he is compliant with Symbicort. He last used albuterol one week ago.He has not used albuterol this week. No CP, SOB or cough. Chronic non-smoker, no second hand smoke exposure.   2. Eczema: he is using kenalog cream. He has rash around his neck and upper shoulders, antecubital foss and popliteal fossa.   3. Seasonal allergies: he reports itching in eyes, watery eyes, nasal congestion at night, intermittent cough.  Social History  Substance Use Topics  . Smoking status: Never Smoker  . Smokeless tobacco: Current User    Types: Chew  . Alcohol use Yes     Comment: beer     Outpatient Medications Prior to Visit  Medication Sig Dispense Refill  . albuterol (PROVENTIL HFA;VENTOLIN HFA) 108 (90 Base) MCG/ACT inhaler Inhale 2 puffs into the lungs every 4 (four) hours as needed for wheezing. 1 Inhaler 11  . budesonide-formoterol (SYMBICORT) 160-4.5 MCG/ACT inhaler Inhale 2 puffs into the lungs 2 (two) times daily. 1 Inhaler 12  . guaiFENesin (MUCINEX) 600 MG 12 hr tablet Take 1 tablet (600 mg total) by mouth 2 (two) times daily. (Patient not taking: Reported on 03/10/2017) 40 tablet 6  . montelukast (SINGULAIR) 10 MG tablet Take 1 tablet (10 mg total) by mouth at bedtime. (Patient not taking: Reported on 03/10/2017) 30 tablet 11  . predniSONE (DELTASONE) 10 MG tablet Take 4 for three days 3 for three days 2 for three days 1 for three days and stop (Patient not taking: Reported on 03/10/2017) 30 tablet 0  . triamcinolone ointment (KENALOG) 0.5 % Apply 1 application topically 2 (two) times daily. Apply to neck rash (Patient not taking: Reported on 11/30/2016) 30 g 3   No facility-administered medications prior to visit.     ROS Review of Systems  Constitutional: Negative for chills, fatigue, fever and unexpected weight change.  Eyes: Negative  for visual disturbance.  Respiratory: Negative for cough and shortness of breath.   Cardiovascular: Negative for chest pain, palpitations and leg swelling.  Gastrointestinal: Negative for abdominal pain, blood in stool, constipation, diarrhea, nausea and vomiting.  Endocrine: Negative for polydipsia, polyphagia and polyuria.  Musculoskeletal: Negative for arthralgias, back pain, gait problem, myalgias and neck pain.  Skin: Positive for rash.  Allergic/Immunologic: Negative for immunocompromised state.  Hematological: Negative for adenopathy. Does not bruise/bleed easily.  Psychiatric/Behavioral: Negative for dysphoric mood, sleep disturbance and suicidal ideas. The patient is not nervous/anxious.     Objective:  BP 137/90   Pulse 72   Temp 98.6 F (37 C) (Oral)   Wt 165 lb 12.8 oz (75.2 kg)   SpO2 97%   BMI 29.37 kg/m   PF Readings from Last 3 Encounters:  10/28/16 180 L/min    BP/Weight 03/10/2017 11/30/2016 70/35/0093  Systolic BP 818 299 371  Diastolic BP 90 696 83  Wt. (Lbs) 165.8 167.8 -  BMI 29.37 29.72 -    Physical Exam  Constitutional: He appears well-developed and well-nourished. No distress.  HENT:  Head: Normocephalic and atraumatic.  Nose: No mucosal edema. Right sinus exhibits no maxillary sinus tenderness and no frontal sinus tenderness. Left sinus exhibits no maxillary sinus tenderness and no frontal sinus tenderness.  Mouth/Throat: Oropharynx is clear and moist.  Eyes: Conjunctivae and EOM are normal. Pupils are equal, round, and reactive to light. Right eye exhibits  no discharge. Left eye exhibits discharge.  Neck: Normal range of motion. Neck supple.    Cardiovascular: Normal rate, regular rhythm, normal heart sounds and intact distal pulses.   Pulmonary/Chest: Effort normal. He has wheezes in the left lower field.  Musculoskeletal: He exhibits no edema.  Neurological: He is alert.  Skin: Skin is warm and dry. No rash noted. No erythema.  Psychiatric:  He has a normal mood and affect.     Assessment & Plan:  Joseph Fitzgerald was seen today for asthma.  Diagnoses and all orders for this visit:  Environmental allergies -     cetirizine (ZYRTEC) 10 MG tablet; Take 1 tablet (10 mg total) by mouth daily. -     ketotifen (ZADITOR) 0.025 % ophthalmic solution; Place 1 drop into both eyes 2 (two) times daily. -     fluticasone (FLONASE) 50 MCG/ACT nasal spray; Place 2 sprays into both nostrils daily.  Moderate persistent asthma with acute exacerbation -     montelukast (SINGULAIR) 10 MG tablet; Take 1 tablet (10 mg total) by mouth at bedtime. -     budesonide-formoterol (SYMBICORT) 160-4.5 MCG/ACT inhaler; Inhale 2 puffs into the lungs 2 (two) times daily. -     albuterol (PROVENTIL HFA;VENTOLIN HFA) 108 (90 Base) MCG/ACT inhaler; Inhale 2 puffs into the lungs every 4 (four) hours as needed for wheezing.  Flexural eczema -     Discontinue: triamcinolone ointment (KENALOG) 0.5 %; Apply 1 application topically 2 (two) times daily. Apply to neck rash -     triamcinolone ointment (KENALOG) 0.5 %; Apply 1 application topically 2 (two) times daily. Apply to neck, arms and legs   There are no diagnoses linked to this encounter.  No orders of the defined types were placed in this encounter.   Follow-up: Return in about 3 months (around 06/09/2017) for asthma.   Boykin Nearing MD

## 2017-03-10 NOTE — Progress Notes (Signed)
Pt needs refills on inhalers.

## 2017-03-10 NOTE — Patient Instructions (Addendum)
Shoji was seen today for asthma.  Diagnoses and all orders for this visit:  Environmental allergies -     cetirizine (ZYRTEC) 10 MG tablet; Take 1 tablet (10 mg total) by mouth daily. -     ketotifen (ZADITOR) 0.025 % ophthalmic solution; Place 1 drop into both eyes 2 (two) times daily. -     fluticasone (FLONASE) 50 MCG/ACT nasal spray; Place 2 sprays into both nostrils daily.  Moderate persistent asthma with acute exacerbation -     montelukast (SINGULAIR) 10 MG tablet; Take 1 tablet (10 mg total) by mouth at bedtime. -     budesonide-formoterol (SYMBICORT) 160-4.5 MCG/ACT inhaler; Inhale 2 puffs into the lungs 2 (two) times daily. -     albuterol (PROVENTIL HFA;VENTOLIN HFA) 108 (90 Base) MCG/ACT inhaler; Inhale 2 puffs into the lungs every 4 (four) hours as needed for wheezing.  Flexural eczema -     triamcinolone ointment (KENALOG) 0.5 %; Apply 1 application topically 2 (two) times daily. Apply to neck rash   Apply kenalog to eczema on neck, arms and legs Also apply thick moisturizer like eucerin or aquaphor  f/u in 3 months for asthma  Dr. Adrian Blackwater

## 2017-03-10 NOTE — Assessment & Plan Note (Signed)
A: no exacerbation P: Continue Symbicort controller medicine Albuterol prn

## 2017-03-10 NOTE — Assessment & Plan Note (Signed)
Zyrtec, singular and flonase

## 2017-03-10 NOTE — Assessment & Plan Note (Signed)
Kenalog ointment and topical emollient recommended

## 2017-04-12 ENCOUNTER — Encounter: Payer: Self-pay | Admitting: Family Medicine

## 2017-06-12 ENCOUNTER — Encounter: Payer: Self-pay | Admitting: Family Medicine

## 2017-06-12 ENCOUNTER — Ambulatory Visit: Payer: Self-pay | Attending: Family Medicine | Admitting: Family Medicine

## 2017-06-12 VITALS — BP 125/83 | HR 78 | Temp 98.7°F | Resp 18 | Ht 63.0 in | Wt 172.0 lb

## 2017-06-12 DIAGNOSIS — J45901 Unspecified asthma with (acute) exacerbation: Secondary | ICD-10-CM | POA: Insufficient documentation

## 2017-06-12 DIAGNOSIS — L2082 Flexural eczema: Secondary | ICD-10-CM | POA: Insufficient documentation

## 2017-06-12 DIAGNOSIS — Z79899 Other long term (current) drug therapy: Secondary | ICD-10-CM | POA: Insufficient documentation

## 2017-06-12 DIAGNOSIS — J4541 Moderate persistent asthma with (acute) exacerbation: Secondary | ICD-10-CM | POA: Insufficient documentation

## 2017-06-12 MED ORDER — TRIAMCINOLONE ACETONIDE 0.5 % EX OINT: 1.0000 "application " | TOPICAL_OINTMENT | Freq: Two times a day (BID) | CUTANEOUS | 5 refills | Status: DC

## 2017-06-12 MED ORDER — PREDNISONE 10 MG PO TABS: ORAL_TABLET | ORAL | 0 refills | Status: DC

## 2017-06-12 MED FILL — ?PREDNISONE 10 MG TABLET: 10 | 9 days supply | Qty: 21 | Fill #0

## 2017-06-12 MED FILL — TRIAMCINOLONE 0.5% OINTMENT: 0.5 | 15 days supply | Qty: 30 | Fill #0

## 2017-06-12 MED FILL — !VENTOLIN HFA INHALER: 108 (90 BAS | 25 days supply | Qty: 18 | Fill #1

## 2017-06-12 NOTE — Patient Instructions (Addendum)
Joseph Fitzgerald was seen today for asthma.  Diagnoses and all orders for this visit:  Mild asthma exacerbation -     Discontinue: predniSONE (DELTASONE) 10 MG tablet; Take by mouth 4 tablets for 2 days, 3 tablets for 2 days, 2 tablets for 2 days, 1 tablet for 3 days then STOP -     predniSONE (DELTASONE) 10 MG tablet; Take by mouth 4 tablets for 2 days, 3 tablets for 2 days, 2 tablets for 2 days, 1 tablet for 3 days then STOP   Please take prednisone taper for mild flare up of asthma off symbicort, sent to onsite pharmacy  Be sure to get all of your refills today  F/u in 2 months for flu shot/asthma follow up   Dr. Adrian Blackwater

## 2017-06-12 NOTE — Assessment & Plan Note (Signed)
A: asthma with mild exacerbation off symbicort for one week P: Prednisone taper Informed patient that all refills are at pharmacy He reports he did not pick up symbicort last week because he knew he had an appointment today

## 2017-06-12 NOTE — Progress Notes (Signed)
Subjective:  Patient ID: Joseph Fitzgerald, male    DOB: 05/03/1971  Age: 46 y.o. MRN: [id number]  CC: Asthma   HPI Corwin Kuiken presents for   1. Asthma: since 2001. He moved to the [place] from [place] in 1998. He had no asthma or eczema in [place]. He reports being out of Symbicort for one week. He reports increased cough, wheezing and itching.  Chronic non-smoker, no second hand smoke exposure.   2. Eczema: he is using kenalog cream. He has rash around his neck and upper shoulders, antecubital foss. He reports kenalog ointment helps. He reports increased itching over the past week.    3. Seasonal allergies: he reports itching in eyes, watery eyes, nasal congestion at night, intermittent cough.  Social History  Substance Use Topics  . Smoking status: Never Smoker  . Smokeless tobacco: Current User    Types: Chew  . Alcohol use Yes     Comment: beer     Outpatient Medications Prior to Visit  Medication Sig Dispense Refill  . albuterol (PROVENTIL HFA;VENTOLIN HFA) 108 (90 Base) MCG/ACT inhaler Inhale 2 puffs into the lungs every 4 (four) hours as needed for wheezing. 1 Inhaler 11  . budesonide-formoterol (SYMBICORT) 160-4.5 MCG/ACT inhaler Inhale 2 puffs into the lungs 2 (two) times daily. 1 Inhaler 12  . cetirizine (ZYRTEC) 10 MG tablet Take 1 tablet (10 mg total) by mouth daily. 30 tablet 11  . fluticasone (FLONASE) 50 MCG/ACT nasal spray Place 2 sprays into both nostrils daily. 16 g 6  . ketotifen (ZADITOR) 0.025 % ophthalmic solution Place 1 drop into both eyes 2 (two) times daily. 10 mL 3  . montelukast (SINGULAIR) 10 MG tablet Take 1 tablet (10 mg total) by mouth at bedtime. 30 tablet 11  . triamcinolone ointment (KENALOG) 0.5 % Apply 1 application topically 2 (two) times daily. Apply to neck, arms and legs 30 g 5   No facility-administered medications prior to visit.     ROS Review of Systems  Constitutional: Negative for chills, fatigue, fever and unexpected weight  change.  Eyes: Negative for visual disturbance.  Respiratory: Positive for cough, shortness of breath and wheezing.   Cardiovascular: Negative for chest pain, palpitations and leg swelling.  Gastrointestinal: Negative for abdominal pain, blood in stool, constipation, diarrhea, nausea and vomiting.  Endocrine: Negative for polydipsia, polyphagia and polyuria.  Musculoskeletal: Negative for arthralgias, back pain, gait problem, myalgias and neck pain.  Skin: Positive for rash.  Allergic/Immunologic: Negative for immunocompromised state.  Hematological: Negative for adenopathy. Does not bruise/bleed easily.  Psychiatric/Behavioral: Negative for dysphoric mood, sleep disturbance and suicidal ideas. The patient is not nervous/anxious.     Objective:  BP 125/83 (BP Location: Left Arm, Patient Position: Sitting, Cuff Size: Normal)   Pulse 78   Temp 98.7 F (37.1 C) (Oral)   Resp 18   Ht 5' 3"  (1.6 m)   Wt 172 lb (78 kg)   SpO2 98%   BMI 30.47 kg/m   PF Readings from Last 3 Encounters:  10/28/16 180 L/min    BP/Weight 06/12/2017 02/24/5187 02/26/6605  Systolic BP 301 601 093  Diastolic BP 83 90 235  Wt. (Lbs) 172 165.8 167.8  BMI 30.47 29.37 29.72    Physical Exam  Constitutional: He appears well-developed and well-nourished. No distress.  HENT:  Head: Normocephalic and atraumatic.  Nose: No mucosal edema. Right sinus exhibits no maxillary sinus tenderness and no frontal sinus tenderness. Left sinus exhibits no maxillary sinus tenderness and no frontal  sinus tenderness.  Mouth/Throat: Oropharynx is clear and moist.  Eyes: Pupils are equal, round, and reactive to light. Conjunctivae and EOM are normal. Right eye exhibits no discharge. Left eye exhibits discharge.  Neck: Normal range of motion. Neck supple.    Cardiovascular: Normal rate, regular rhythm, normal heart sounds and intact distal pulses.   Pulmonary/Chest: Effort normal. He has wheezes in the left lower field.    Musculoskeletal: He exhibits no edema.  Neurological: He is alert.  Skin: Skin is warm and dry. No rash noted. No erythema.  Psychiatric: He has a normal mood and affect.   Lab Results  Component Value Date   HGBA1C 5.4 08/14/2016     Assessment & Plan:  Isidro was seen today for asthma.  Diagnoses and all orders for this visit:  Mild asthma exacerbation -     Discontinue: predniSONE (DELTASONE) 10 MG tablet; Take by mouth 4 tablets for 2 days, 3 tablets for 2 days, 2 tablets for 2 days, 1 tablet for 3 days then STOP -     predniSONE (DELTASONE) 10 MG tablet; Take by mouth 4 tablets for 2 days, 3 tablets for 2 days, 2 tablets for 2 days, 1 tablet for 3 days then STOP  Moderate persistent asthma with acute exacerbation  Flexural eczema -     triamcinolone ointment (KENALOG) 0.5 %; Apply 1 application topically 2 (two) times daily. Apply to neck, arms and legs   There are no diagnoses linked to this encounter.  No orders of the defined types were placed in this encounter.   Follow-up: Return in about 2 months (around 08/13/2017) for flu shot and asthma.   Boykin Nearing MD

## 2017-06-12 NOTE — Assessment & Plan Note (Addendum)
Persistent Increase itching and scratching Prednisone taper Continue kenalog

## 2017-06-16 ENCOUNTER — Ambulatory Visit: Payer: Self-pay | Attending: Family Medicine

## 2017-06-16 MED FILL — SYMBICORT 160-4.5 MCG INH: 160-4.5 | 30 days supply | Qty: 10 | Fill #1

## 2017-07-03 ENCOUNTER — Other Ambulatory Visit: Payer: Self-pay

## 2017-07-03 MED ORDER — ALBUTEROL SULFATE HFA 108 (90 BASE) MCG/ACT IN AERS: 2.0000 | INHALATION_SPRAY | Freq: Four times a day (QID) | RESPIRATORY_TRACT | 3 refills | Status: DC | PRN

## 2017-07-03 MED ORDER — FLUTICASONE FUROATE-VILANTEROL 200-25 MCG/INH IN AEPB: 1.0000 | INHALATION_SPRAY | Freq: Every day | RESPIRATORY_TRACT | 3 refills | Status: DC

## 2017-07-18 ENCOUNTER — Other Ambulatory Visit: Payer: Self-pay

## 2017-07-18 MED ORDER — ALBUTEROL SULFATE HFA 108 (90 BASE) MCG/ACT IN AERS: 2.0000 | INHALATION_SPRAY | Freq: Four times a day (QID) | RESPIRATORY_TRACT | 3 refills | Status: DC | PRN

## 2017-07-18 MED ORDER — FLUTICASONE FUROATE-VILANTEROL 200-25 MCG/INH IN AEPB: 1.0000 | INHALATION_SPRAY | Freq: Every day | RESPIRATORY_TRACT | 3 refills | Status: DC

## 2017-07-28 MED FILL — !VENTOLIN HFA INHALER: 108 (90 BAS | 16 days supply | Qty: 18 | Fill #2

## 2017-07-28 MED FILL — SYMBICORT 160-4.5 MCG INH: 160-4.5 | 30 days supply | Qty: 10 | Fill #2

## 2017-08-25 ENCOUNTER — Encounter: Payer: Self-pay | Admitting: Internal Medicine

## 2017-08-25 ENCOUNTER — Ambulatory Visit: Payer: Self-pay | Attending: Internal Medicine | Admitting: Internal Medicine

## 2017-08-25 VITALS — BP 115/78 | HR 57 | Temp 98.4°F | Resp 16 | Wt 178.0 lb

## 2017-08-25 DIAGNOSIS — Z9109 Other allergy status, other than to drugs and biological substances: Secondary | ICD-10-CM

## 2017-08-25 DIAGNOSIS — Z825 Family history of asthma and other chronic lower respiratory diseases: Secondary | ICD-10-CM | POA: Insufficient documentation

## 2017-08-25 DIAGNOSIS — Z7951 Long term (current) use of inhaled steroids: Secondary | ICD-10-CM | POA: Insufficient documentation

## 2017-08-25 DIAGNOSIS — Z79899 Other long term (current) drug therapy: Secondary | ICD-10-CM | POA: Insufficient documentation

## 2017-08-25 DIAGNOSIS — Z880 Allergy status to penicillin: Secondary | ICD-10-CM | POA: Insufficient documentation

## 2017-08-25 DIAGNOSIS — J4540 Moderate persistent asthma, uncomplicated: Secondary | ICD-10-CM | POA: Insufficient documentation

## 2017-08-25 DIAGNOSIS — Z833 Family history of diabetes mellitus: Secondary | ICD-10-CM | POA: Insufficient documentation

## 2017-08-25 DIAGNOSIS — E559 Vitamin D deficiency, unspecified: Secondary | ICD-10-CM | POA: Insufficient documentation

## 2017-08-25 DIAGNOSIS — Z23 Encounter for immunization: Secondary | ICD-10-CM | POA: Insufficient documentation

## 2017-08-25 MED ORDER — CETIRIZINE HCL 10 MG PO TABS: 10.0000 mg | ORAL_TABLET | Freq: Every day | ORAL | 11 refills | Status: DC

## 2017-08-25 NOTE — Patient Instructions (Addendum)
You have refills on Zyrtec and Singulair you just need to come to the pharmacy to request refills when you run out.   Influenza Virus Vaccine injection (Fluarix) What is this medicine? INFLUENZA VIRUS VACCINE (in floo EN zuh VAHY ruhs vak SEEN) helps to reduce the risk of getting influenza also known as the flu. This medicine may be used for other purposes; ask your health care provider or pharmacist if you have questions. COMMON BRAND NAME(S): Fluarix, Fluzone What should I tell my health care provider before I take this medicine? They need to know if you have any of these conditions: -bleeding disorder like hemophilia -fever or infection -Guillain-Barre syndrome or other neurological problems -immune system problems -infection with the human immunodeficiency virus (HIV) or AIDS -low blood platelet counts -multiple sclerosis -an unusual or allergic reaction to influenza virus vaccine, eggs, chicken proteins, latex, gentamicin, other medicines, foods, dyes or preservatives -pregnant or trying to get pregnant -breast-feeding How should I use this medicine? This vaccine is for injection into a muscle. It is given by a health care professional. A copy of Vaccine Information Statements will be given before each vaccination. Read this sheet carefully each time. The sheet may change frequently. Talk to your pediatrician regarding the use of this medicine in children. Special care may be needed. Overdosage: If you think you have taken too much of this medicine contact a poison control center or emergency room at once. NOTE: This medicine is only for you. Do not share this medicine with others. What if I miss a dose? This does not apply. What may interact with this medicine? -chemotherapy or radiation therapy -medicines that lower your immune system like etanercept, anakinra, infliximab, and adalimumab -medicines that treat or prevent blood clots like warfarin -phenytoin -steroid medicines  like prednisone or cortisone -theophylline -vaccines This list may not describe all possible interactions. Give your health care provider a list of all the medicines, herbs, non-prescription drugs, or dietary supplements you use. Also tell them if you smoke, drink alcohol, or use illegal drugs. Some items may interact with your medicine. What should I watch for while using this medicine? Report any side effects that do not go away within 3 days to your doctor or health care professional. Call your health care provider if any unusual symptoms occur within 6 weeks of receiving this vaccine. You may still catch the flu, but the illness is not usually as bad. You cannot get the flu from the vaccine. The vaccine will not protect against colds or other illnesses that may cause fever. The vaccine is needed every year. What side effects may I notice from receiving this medicine? Side effects that you should report to your doctor or health care professional as soon as possible: -allergic reactions like skin rash, itching or hives, swelling of the face, lips, or tongue Side effects that usually do not require medical attention (report to your doctor or health care professional if they continue or are bothersome): -fever -headache -muscle aches and pains -pain, tenderness, redness, or swelling at site where injected -weak or tired This list may not describe all possible side effects. Call your doctor for medical advice about side effects. You may report side effects to FDA at 1-800-FDA-1088. Where should I keep my medicine? This vaccine is only given in a clinic, pharmacy, doctor's office, or other health care setting and will not be stored at home. NOTE: This sheet is a summary. It may not cover all possible information. If you have  questions about this medicine, talk to your doctor, pharmacist, or health care provider.  2018 Elsevier/Gold Standard (2008-06-11 09:30:40)

## 2017-08-25 NOTE — Progress Notes (Signed)
Patient ID: Joseph Fitzgerald, male    DOB: 01/10/1971  MRN: [id number]  CC: re-establish and Asthma   Subjective: Joseph Fitzgerald is a 46 y.o. male who presents for chronic ds management. Last saw Funches 05/2017 His concerns today include:  Pt with mod persistent asthma and eczema  1. Asthma/allergies: on Symbicort and Albuterol. Breo ordered through PAP but has not come in as yet.  -using Albuterol 1-2 a mth -endorses itchy eyes. Out of Zyrtec but has refills.   Patient Active Problem List   Diagnosis Date Noted  . Asthma, moderate persistent 08/17/2016  . Vitamin D deficiency 09/01/2015  . Family history of diabetes mellitus in brother 08/31/2015  . Eczema 08/31/2015  . Environmental allergies 04/16/2014     Current Outpatient Prescriptions on File Prior to Visit  Medication Sig Dispense Refill  . albuterol (PROVENTIL HFA;VENTOLIN HFA) 108 (90 Base) MCG/ACT inhaler Inhale 2 puffs into the lungs every 4 (four) hours as needed for wheezing. 1 Inhaler 11  . albuterol (VENTOLIN HFA) 108 (90 Base) MCG/ACT inhaler Inhale 2 puffs into the lungs every 6 (six) hours as needed for wheezing or shortness of breath. 54 g 3  . budesonide-formoterol (SYMBICORT) 160-4.5 MCG/ACT inhaler Inhale 2 puffs into the lungs 2 (two) times daily. 1 Inhaler 12  . fluticasone (FLONASE) 50 MCG/ACT nasal spray Place 2 sprays into both nostrils daily. 16 g 6  . fluticasone furoate-vilanterol (BREO ELLIPTA) 200-25 MCG/INH AEPB Inhale 1 puff into the lungs daily. 180 each 3  . ketotifen (ZADITOR) 0.025 % ophthalmic solution Place 1 drop into both eyes 2 (two) times daily. 10 mL 3  . montelukast (SINGULAIR) 10 MG tablet Take 1 tablet (10 mg total) by mouth at bedtime. 30 tablet 11  . triamcinolone ointment (KENALOG) 0.5 % Apply 1 application topically 2 (two) times daily. Apply to neck, arms and legs 90 g 5   No current facility-administered medications on file prior to visit.     Allergies  Allergen Reactions  .  Penicillins Rash    Social History   Social History  . Marital status: Single    Spouse name: N/A  . Number of children: 3  . Years of education: 12   Occupational History  . [employer]   Social History Main Topics  . Smoking status: Never Smoker  . Smokeless tobacco: Current User    Types: Chew  . Alcohol use Yes     Comment: beer  . Drug use: No  . Sexual activity: Yes   Other Topics Concern  . Not on file   Social History Narrative  . No narrative on file    Family History  Problem Relation Age of Onset  . Asthma Brother   . Diabetes Brother     Past Surgical History:  Procedure Laterality Date  . NO PAST SURGERIES      ROS: Review of Systems Negative except as stated above PHYSICAL EXAM: BP 115/78   Pulse (!) 57   Temp 98.4 F (36.9 C) (Oral)   Resp 16   Wt 178 lb (80.7 kg)   SpO2 95%   BMI 31.53 kg/m   Wt Readings from Last 3 Encounters:  08/25/17 178 lb (80.7 kg)  06/12/17 172 lb (78 kg)  03/10/17 165 lb 12.8 oz (75.2 kg)    Physical Exam  General appearance - alert, well appearing, middle-age male in no distress Mental status - alert, oriented to person, place, and time, normal mood,  behavior, speech, dress, motor activity, and thought processes Eyes -mild tearing noted, mild conjunctival injection Mouth -teeth stained dark, no oral lesions. Neck - supple, no significant adenopathy Chest - clear to auscultation, no wheezes, rales or rhonchi, symmetric air entry Heart - normal rate, regular rhythm, normal S1, S2, no murmurs, rubs, clicks or gallops  ASSESSMENT AND PLAN: 1. Moderate persistent asthma without complication Well-controlled on Symbicort. Plan is to change to [facility] once it comes in through PAP.  2. Environmental allergies - cetirizine (ZYRTEC) 10 MG tablet; Take 1 tablet (10 mg total) by mouth daily.  Dispense: 30 tablet; Refill: 11  3. Need for influenza vaccination - Flu Vaccine QUAD 6+ mos PF IM (Fluarix Quad  PF)  Patient was given the opportunity to ask questions.  Patient verbalized understanding of the plan and was able to repeat key elements of the plan.   Orders Placed This Encounter  Procedures  . Flu Vaccine QUAD 6+ mos PF IM (Fluarix Quad PF)     Requested Prescriptions   Signed Prescriptions Disp Refills  . cetirizine (ZYRTEC) 10 MG tablet 30 tablet 11    Sig: Take 1 tablet (10 mg total) by mouth daily.    Return in about 4 months (around 12/25/2017).  Karle Plumber, MD, FACP

## 2017-08-28 MED FILL — ?CETIRIZINE HCL 10 MG TABLE: 10 | 30 days supply | Qty: 30 | Fill #0

## 2017-09-01 MED FILL — $VENTOLIN HFA 18G INHALER: 108 (90 BAS | 16 days supply | Qty: 18 | Fill #3

## 2017-09-04 ENCOUNTER — Other Ambulatory Visit: Payer: Self-pay | Admitting: Pharmacist

## 2017-09-04 MED FILL — $BREO ELLIPTA 200-25 MCG IN: 200-25 | 30 days supply | Qty: 60 | Fill #0

## 2017-10-10 MED FILL — $BREO ELLIPTA 200-25 MCG IN: 200-25 | 30 days supply | Qty: 60 | Fill #1

## 2017-11-08 MED FILL — $BREO ELLIPTA 200-25 MCG IN: 200-25 | 30 days supply | Qty: 60 | Fill #2

## 2017-11-09 MED FILL — $VENTOLIN HFA 18G INHALER: 108 (90 BAS | 16 days supply | Qty: 18 | Fill #4

## 2017-12-07 MED FILL — $BREO ELLIPTA 200-25 MCG IN: 200-25 | 30 days supply | Qty: 60 | Fill #3

## 2017-12-25 ENCOUNTER — Ambulatory Visit: Payer: Self-pay | Admitting: Internal Medicine

## 2018-01-04 ENCOUNTER — Ambulatory Visit: Payer: Self-pay | Attending: Internal Medicine | Admitting: Internal Medicine

## 2018-01-04 ENCOUNTER — Encounter: Payer: Self-pay | Admitting: Internal Medicine

## 2018-01-04 VITALS — BP 127/86 | HR 71 | Temp 98.1°F | Resp 16 | Wt 171.0 lb

## 2018-01-04 DIAGNOSIS — J4540 Moderate persistent asthma, uncomplicated: Secondary | ICD-10-CM

## 2018-01-04 DIAGNOSIS — Z825 Family history of asthma and other chronic lower respiratory diseases: Secondary | ICD-10-CM | POA: Insufficient documentation

## 2018-01-04 DIAGNOSIS — E669 Obesity, unspecified: Secondary | ICD-10-CM | POA: Insufficient documentation

## 2018-01-04 DIAGNOSIS — Z6830 Body mass index (BMI) 30.0-30.9, adult: Secondary | ICD-10-CM

## 2018-01-04 DIAGNOSIS — Z880 Allergy status to penicillin: Secondary | ICD-10-CM | POA: Insufficient documentation

## 2018-01-04 DIAGNOSIS — Z833 Family history of diabetes mellitus: Secondary | ICD-10-CM | POA: Insufficient documentation

## 2018-01-04 DIAGNOSIS — Z9109 Other allergy status, other than to drugs and biological substances: Secondary | ICD-10-CM

## 2018-01-04 DIAGNOSIS — E559 Vitamin D deficiency, unspecified: Secondary | ICD-10-CM | POA: Insufficient documentation

## 2018-01-04 DIAGNOSIS — E6609 Other obesity due to excess calories: Secondary | ICD-10-CM

## 2018-01-04 DIAGNOSIS — Z79899 Other long term (current) drug therapy: Secondary | ICD-10-CM | POA: Insufficient documentation

## 2018-01-04 DIAGNOSIS — L309 Dermatitis, unspecified: Secondary | ICD-10-CM

## 2018-01-04 MED ORDER — ALBUTEROL SULFATE HFA 108 (90 BASE) MCG/ACT IN AERS: 2.0000 | INHALATION_SPRAY | RESPIRATORY_TRACT | 11 refills | Status: DC | PRN

## 2018-01-04 MED ORDER — FLUTICASONE PROPIONATE 50 MCG/ACT NA SUSP: 2.0000 | Freq: Every day | NASAL | 6 refills | Status: DC

## 2018-01-04 MED ORDER — LORATADINE 10 MG PO TABS
10.0000 mg | ORAL_TABLET | Freq: Every day | ORAL | 11 refills | Status: DC
Start: 2018-01-04 — End: 2018-03-26

## 2018-01-04 MED ORDER — MONTELUKAST SODIUM 10 MG PO TABS: 10.0000 mg | ORAL_TABLET | Freq: Every day | ORAL | 11 refills | Status: DC

## 2018-01-04 MED FILL — ?MONTELUKAST SOD 10 MG TAB: 10 | 30 days supply | Qty: 30 | Fill #0

## 2018-01-04 MED FILL — FLUTICASONE PROP 50 MCG SPR: 50 | 30 days supply | Qty: 16 | Fill #0

## 2018-01-04 MED FILL — $VENTOLIN HFA 18G INHALER: 108 (90 BAS | 48 days supply | Qty: 54 | Fill #5

## 2018-01-04 NOTE — Progress Notes (Signed)
Patient ID: Joseph Fitzgerald, male    DOB: 05-21-1971  MRN: [id number]  CC: Follow-up (Asthma) and Medication Refill   Subjective: Joseph Fitzgerald is a 47 y.o. male who presents for chronic ds management His concerns today include:  Pt with mod persistent asthma and eczema  1.  Asthma:  No recent flares -Did get Breo through PAP.  Using as prescribed.  Not having to use Albuterol more than a few times a wk -very bad allergies.  C/o itchy, watery eyes, rhinorrhea and itchy skin.  He has been out of Zyrtec, Singulair and Flonase for some time -using Triamcinolone cream   Patient Active Problem List   Diagnosis Date Noted  . Asthma, moderate persistent 08/17/2016  . Vitamin D deficiency 09/01/2015  . Family history of diabetes mellitus in brother 08/31/2015  . Eczema 08/31/2015  . Environmental allergies 04/16/2014     Current Outpatient Medications on File Prior to Visit  Medication Sig Dispense Refill  . fluticasone furoate-vilanterol (BREO ELLIPTA) 200-25 MCG/INH AEPB Inhale 1 puff into the lungs daily. 180 each 3  . ketotifen (ZADITOR) 0.025 % ophthalmic solution Place 1 drop into both eyes 2 (two) times daily. (Patient not taking: Reported on 01/04/2018) 10 mL 3  . triamcinolone ointment (KENALOG) 0.5 % Apply 1 application topically 2 (two) times daily. Apply to neck, arms and legs (Patient not taking: Reported on 01/04/2018) 90 g 5   No current facility-administered medications on file prior to visit.     Allergies  Allergen Reactions  . Penicillins Rash    Social History   Socioeconomic History  . Marital status: Single    Spouse name: Not on file  . Number of children: 3  . Years of education: 45  . Highest education level: Not on file  Social Needs  . Financial resource strain: Not on file  . Food insecurity - worry: Not on file  . Food insecurity - inability: Not on file  . Transportation needs - medical: Not on file  . Transportation needs - non-medical: Not on  file  Occupational History  . Occupation: Research scientist (physical sciences): [employer]  Tobacco Use  . Smoking status: Never Smoker  . Smokeless tobacco: Current User    Types: Chew  Substance and Sexual Activity  . Alcohol use: Yes    Comment: beer  . Drug use: No  . Sexual activity: Yes  Other Topics Concern  . Not on file  Social History Narrative  . Not on file    Family History  Problem Relation Age of Onset  . Asthma Brother   . Diabetes Brother     Past Surgical History:  Procedure Laterality Date  . NO PAST SURGERIES      ROS: Review of Systems Neg except as above PHYSICAL EXAM: BP 127/86   Pulse 71   Temp 98.1 F (36.7 C) (Oral)   Resp 16   Wt 171 lb (77.6 kg)   SpO2 95%   BMI 30.29 kg/m    Wt Readings from Last 3 Encounters:  01/04/18 171 lb (77.6 kg)  08/25/17 178 lb (80.7 kg)  06/12/17 172 lb (78 kg)    Physical Exam  General appearance - alert, well appearing, and in no distress Mental status - alert, oriented to person, place, and time, normal mood, behavior, speech, dress, motor activity, and thought processes Eyes -patient has high per pigmented shiners under both eyes Nose - normal and patent, no erythema, discharge or  polyps Mouth - mucous membranes moist, pharynx normal without lesions Neck - supple, no significant adenopathy Chest - clear to auscultation, no wheezes, rales or rhonchi, symmetric air entry Heart - normal rate, regular rhythm, normal S1, S2, no murmurs, rubs, clicks or gallops Extremities - peripheral pulses normal, no pedal edema, no clubbing or cyanosis   ASSESSMENT AND PLAN: 1. Moderate persistent asthma without complication With associated significant allergies - loratadine (CLARITIN) 10 MG tablet; Take 1 tablet (10 mg total) by mouth daily.  Dispense: 30 tablet; Refill: 11 - fluticasone (FLONASE) 50 MCG/ACT nasal spray; Place 2 sprays into both nostrils daily.  Dispense: 16 g; Refill: 6 - montelukast (SINGULAIR) 10 MG  tablet; Take 1 tablet (10 mg total) by mouth at bedtime.  Dispense: 30 tablet; Refill: 11 - albuterol (PROVENTIL HFA;VENTOLIN HFA) 108 (90 Base) MCG/ACT inhaler; Inhale 2 puffs into the lungs every 4 (four) hours as needed for wheezing.  Dispense: 1 Inhaler; Refill: 11  2. Eczema, unspecified type Advised him to purchase some Aveeno bath packs over-the-counter and using the bath water. Also recommend using Vaseline after showers to help lock in moisture  3. Environmental allergies - loratadine (CLARITIN) 10 MG tablet; Take 1 tablet (10 mg total) by mouth daily.  Dispense: 30 tablet; Refill: 11 - fluticasone (FLONASE) 50 MCG/ACT nasal spray; Place 2 sprays into both nostrils daily.  Dispense: 16 g; Refill: 6  4. Class 1 obesity due to excess calories without serious comorbidity with body mass index (BMI) of 30.0 to 30.9 in adult -Discussed the importance of healthy eating habits and regular exercise (at least 150 mins/wk) and helping to maintain good health - CBC - Comprehensive metabolic panel - Lipid panel   Patient was given the opportunity to ask questions.  Patient verbalized understanding of the plan and was able to repeat key elements of the plan.   Orders Placed This Encounter  Procedures  . CBC  . Comprehensive metabolic panel  . Lipid panel     Requested Prescriptions   Signed Prescriptions Disp Refills  . loratadine (CLARITIN) 10 MG tablet 30 tablet 11    Sig: Take 1 tablet (10 mg total) by mouth daily.  . fluticasone (FLONASE) 50 MCG/ACT nasal spray 16 g 6    Sig: Place 2 sprays into both nostrils daily.  . montelukast (SINGULAIR) 10 MG tablet 30 tablet 11    Sig: Take 1 tablet (10 mg total) by mouth at bedtime.  Marland Kitchen albuterol (PROVENTIL HFA;VENTOLIN HFA) 108 (90 Base) MCG/ACT inhaler 1 Inhaler 11    Sig: Inhale 2 puffs into the lungs every 4 (four) hours as needed for wheezing.    Return in about 4 months (around 05/04/2018).  Karle Plumber, MD, FACP

## 2018-01-05 LAB — CBC
Hematocrit: 45.4 % (ref 37.5–51.0)
Hemoglobin: 15.4 g/dL (ref 13.0–17.7)
MCH: 31.2 pg (ref 26.6–33.0)
MCHC: 33.9 g/dL (ref 31.5–35.7)
MCV: 92 fL (ref 79–97)
Platelets: 207 10*3/uL (ref 150–379)
RBC: 4.94 x10E6/uL (ref 4.14–5.80)
RDW: 13.8 % (ref 12.3–15.4)
WBC: 5.1 10*3/uL (ref 3.4–10.8)

## 2018-01-05 LAB — COMPREHENSIVE METABOLIC PANEL
ALT: 20 IU/L (ref 0–44)
AST: 19 IU/L (ref 0–40)
Albumin/Globulin Ratio: 1.4 (ref 1.2–2.2)
Albumin: 4.1 g/dL (ref 3.5–5.5)
Alkaline Phosphatase: 119 IU/L — ABNORMAL HIGH (ref 39–117)
BILIRUBIN TOTAL: 0.3 mg/dL (ref 0.0–1.2)
BUN / CREAT RATIO: 11 (ref 9–20)
BUN: 10 mg/dL (ref 6–24)
CHLORIDE: 105 mmol/L (ref 96–106)
CO2: 25 mmol/L (ref 20–29)
Calcium: 9 mg/dL (ref 8.7–10.2)
Creatinine, Ser: 0.91 mg/dL (ref 0.76–1.27)
GFR calc non Af Amer: 102 mL/min/{1.73_m2} (ref >59)
GFR, EST AFRICAN AMERICAN: 118 mL/min/{1.73_m2} (ref >59)
GLOBULIN, TOTAL: 3 g/dL (ref 1.5–4.5)
GLUCOSE: 106 mg/dL — AB (ref 65–99)
Potassium: 4.3 mmol/L (ref 3.5–5.2)
Sodium: 145 mmol/L — ABNORMAL HIGH (ref 134–144)
TOTAL PROTEIN: 7.1 g/dL (ref 6.0–8.5)

## 2018-01-05 LAB — LIPID PANEL
Chol/HDL Ratio: 4.5 ratio (ref 0.0–5.0)
Cholesterol, Total: 196 mg/dL (ref 100–199)
HDL: 44 mg/dL (ref >39)
LDL CALC: 114 mg/dL — AB (ref 0–99)
Triglycerides: 192 mg/dL — ABNORMAL HIGH (ref 0–149)
VLDL CHOLESTEROL CAL: 38 mg/dL (ref 5–40)

## 2018-01-05 MED FILL — $BREO ELLIPTA 200-25 MCG IN: 200-25 | 90 days supply | Qty: 180 | Fill #4

## 2018-01-11 ENCOUNTER — Telehealth: Payer: Self-pay

## 2018-01-11 NOTE — Telephone Encounter (Signed)
Contacted pt to go over lab results spoke with pt wife to go over pt results she will let pt know. Pt wife doesn't have any questions or concerns

## 2018-03-16 ENCOUNTER — Emergency Department (HOSPITAL_COMMUNITY): Payer: Self-pay

## 2018-03-16 ENCOUNTER — Other Ambulatory Visit: Payer: Self-pay

## 2018-03-16 ENCOUNTER — Encounter (HOSPITAL_COMMUNITY): Payer: Self-pay | Admitting: Emergency Medicine

## 2018-03-16 ENCOUNTER — Inpatient Hospital Stay (HOSPITAL_COMMUNITY)
Admission: EM | Admit: 2018-03-16 | Discharge: 2018-03-18 | DRG: 189 | Disposition: A | Discharge status: Home / Self Care | Payer: Self-pay | Attending: Family Medicine | Admitting: Family Medicine

## 2018-03-16 DIAGNOSIS — R402413 Glasgow coma scale score 13-15, at hospital admission: Secondary | ICD-10-CM | POA: Diagnosis present

## 2018-03-16 DIAGNOSIS — J4540 Moderate persistent asthma, uncomplicated: Secondary | ICD-10-CM

## 2018-03-16 DIAGNOSIS — J9601 Acute respiratory failure with hypoxia: Principal | ICD-10-CM | POA: Diagnosis present

## 2018-03-16 DIAGNOSIS — R000 Tachycardia, unspecified: Secondary | ICD-10-CM | POA: Diagnosis present

## 2018-03-16 DIAGNOSIS — D72829 Elevated white blood cell count, unspecified: Secondary | ICD-10-CM | POA: Diagnosis present

## 2018-03-16 DIAGNOSIS — R0902 Hypoxemia: Secondary | ICD-10-CM

## 2018-03-16 DIAGNOSIS — T380X5A Adverse effect of glucocorticoids and synthetic analogues, initial encounter: Secondary | ICD-10-CM | POA: Diagnosis present

## 2018-03-16 DIAGNOSIS — Z720 Tobacco use: Secondary | ICD-10-CM

## 2018-03-16 DIAGNOSIS — R030 Elevated blood-pressure reading, without diagnosis of hypertension: Secondary | ICD-10-CM | POA: Diagnosis present

## 2018-03-16 DIAGNOSIS — T50905A Adverse effect of unspecified drugs, medicaments and biological substances, initial encounter: Secondary | ICD-10-CM

## 2018-03-16 DIAGNOSIS — J45901 Unspecified asthma with (acute) exacerbation: Secondary | ICD-10-CM | POA: Diagnosis present

## 2018-03-16 DIAGNOSIS — R739 Hyperglycemia, unspecified: Secondary | ICD-10-CM | POA: Diagnosis present

## 2018-03-16 DIAGNOSIS — Z880 Allergy status to penicillin: Secondary | ICD-10-CM

## 2018-03-16 DIAGNOSIS — Z6829 Body mass index (BMI) 29.0-29.9, adult: Secondary | ICD-10-CM

## 2018-03-16 DIAGNOSIS — Z7951 Long term (current) use of inhaled steroids: Secondary | ICD-10-CM

## 2018-03-16 DIAGNOSIS — E876 Hypokalemia: Secondary | ICD-10-CM | POA: Diagnosis present

## 2018-03-16 DIAGNOSIS — E669 Obesity, unspecified: Secondary | ICD-10-CM | POA: Diagnosis present

## 2018-03-16 HISTORY — DX: Type 2 diabetes mellitus without complications: E11.9

## 2018-03-16 MED ORDER — ALBUTEROL SULFATE (2.5 MG/3ML) 0.083% IN NEBU: 5.0000 mg | INHALATION_SOLUTION | Freq: Once | RESPIRATORY_TRACT | Status: DC

## 2018-03-16 NOTE — ED Notes (Signed)
Once neb completed, Pt placed on RA, O2 stats dropped to 84%, placed on 3L O2 and returned to 95%.

## 2018-03-16 NOTE — ED Notes (Signed)
Patient transported to X-ray 

## 2018-03-16 NOTE — ED Triage Notes (Signed)
Per EMS, pt SOB w/ bilateral wheezes.  Pt started w/ a sore throat and cough yesterday and this afternoon began to experience problems breathing, despite his prescribed inhalier.  Given 10 albuteral, .5 atrovent, 125 Solu mederal.  Pt was 89% on RA then increased to 97% on breathing treatment.  Possibly has a fever per family

## 2018-03-17 ENCOUNTER — Encounter (HOSPITAL_COMMUNITY): Payer: Self-pay | Admitting: Internal Medicine

## 2018-03-17 ENCOUNTER — Other Ambulatory Visit: Payer: Self-pay

## 2018-03-17 DIAGNOSIS — R739 Hyperglycemia, unspecified: Secondary | ICD-10-CM | POA: Diagnosis present

## 2018-03-17 DIAGNOSIS — E119 Type 2 diabetes mellitus without complications: Secondary | ICD-10-CM | POA: Insufficient documentation

## 2018-03-17 DIAGNOSIS — E876 Hypokalemia: Secondary | ICD-10-CM | POA: Diagnosis present

## 2018-03-17 DIAGNOSIS — R0902 Hypoxemia: Secondary | ICD-10-CM | POA: Insufficient documentation

## 2018-03-17 DIAGNOSIS — I10 Essential (primary) hypertension: Secondary | ICD-10-CM

## 2018-03-17 DIAGNOSIS — J45901 Unspecified asthma with (acute) exacerbation: Secondary | ICD-10-CM

## 2018-03-17 DIAGNOSIS — T50905A Adverse effect of unspecified drugs, medicaments and biological substances, initial encounter: Secondary | ICD-10-CM

## 2018-03-17 DIAGNOSIS — R030 Elevated blood-pressure reading, without diagnosis of hypertension: Secondary | ICD-10-CM | POA: Diagnosis present

## 2018-03-17 LAB — CBC
HEMATOCRIT: 47.2 % (ref 39.0–52.0)
Hemoglobin: 16.1 g/dL (ref 13.0–17.0)
MCH: 31.7 pg (ref 26.0–34.0)
MCHC: 34.1 g/dL (ref 30.0–36.0)
MCV: 92.9 fL (ref 78.0–100.0)
Platelets: 211 10*3/uL (ref 150–400)
RBC: 5.08 MIL/uL (ref 4.22–5.81)
RDW: 12.4 % (ref 11.5–15.5)
WBC: 11.7 10*3/uL — ABNORMAL HIGH (ref 4.0–10.5)

## 2018-03-17 LAB — MRSA PCR SCREENING: MRSA by PCR: NEGATIVE

## 2018-03-17 LAB — HEMOGLOBIN A1C
Hgb A1c MFr Bld: 5.1 % (ref 4.8–5.6)
MEAN PLASMA GLUCOSE: 99.67 mg/dL

## 2018-03-17 LAB — I-STAT CHEM 8, ED
BUN: 10 mg/dL (ref 6–20)
CREATININE: 0.8 mg/dL (ref 0.61–1.24)
Calcium, Ion: 1.01 mmol/L — ABNORMAL LOW (ref 1.15–1.40)
Chloride: 101 mmol/L (ref 101–111)
GLUCOSE: 263 mg/dL — AB (ref 65–99)
HCT: 49 % (ref 39.0–52.0)
HEMOGLOBIN: 16.7 g/dL (ref 13.0–17.0)
Potassium: 3.3 mmol/L — ABNORMAL LOW (ref 3.5–5.1)
Sodium: 138 mmol/L (ref 135–145)
TCO2: 21 mmol/L — AB (ref 22–32)

## 2018-03-17 LAB — INFLUENZA PANEL BY PCR (TYPE A & B)
Influenza A By PCR: NEGATIVE
Influenza B By PCR: NEGATIVE

## 2018-03-17 LAB — GLUCOSE, CAPILLARY
GLUCOSE-CAPILLARY: 116 mg/dL — AB (ref 65–99)
GLUCOSE-CAPILLARY: 238 mg/dL — AB (ref 65–99)

## 2018-03-17 LAB — CBG MONITORING, ED: Glucose-Capillary: 129 mg/dL — ABNORMAL HIGH (ref 65–99)

## 2018-03-17 LAB — MAGNESIUM: Magnesium: 2.4 mg/dL (ref 1.7–2.4)

## 2018-03-17 MED ORDER — INSULIN ASPART 100 UNIT/ML ~~LOC~~ SOLN: 0.0000 [IU] | Freq: Every day | SUBCUTANEOUS | Status: DC

## 2018-03-17 MED ORDER — INSULIN ASPART 100 UNIT/ML ~~LOC~~ SOLN
0.0000 [IU] | Freq: Three times a day (TID) | SUBCUTANEOUS | Status: DC
  Administered 2018-03-17: 5 [IU] via SUBCUTANEOUS
  Filled 2018-03-17: qty 1

## 2018-03-17 MED ORDER — FLUTICASONE PROPIONATE 50 MCG/ACT NA SUSP
2.0000 | Freq: Every day | NASAL | Status: DC
  Administered 2018-03-17 – 2018-03-18 (×2): 2 via NASAL
  Filled 2018-03-17 (×2): qty 16

## 2018-03-17 MED ORDER — MAGNESIUM SULFATE 2 GM/50ML IV SOLN
2.0000 g | INTRAVENOUS | Status: AC
  Administered 2018-03-17: 2 g via INTRAVENOUS
  Filled 2018-03-17: qty 50

## 2018-03-17 MED ORDER — METHYLPREDNISOLONE SODIUM SUCC 125 MG IJ SOLR
60.0000 mg | Freq: Three times a day (TID) | INTRAMUSCULAR | Status: DC
  Administered 2018-03-17 – 2018-03-18 (×4): 60 mg via INTRAVENOUS
  Filled 2018-03-17 (×4): qty 2

## 2018-03-17 MED ORDER — ACETAMINOPHEN 650 MG RE SUPP: 650.0000 mg | Freq: Four times a day (QID) | RECTAL | Status: DC | PRN

## 2018-03-17 MED ORDER — INSULIN ASPART 100 UNIT/ML ~~LOC~~ SOLN
0.0000 [IU] | Freq: Three times a day (TID) | SUBCUTANEOUS | Status: DC
  Administered 2018-03-17 – 2018-03-18 (×3): 2 [IU] via SUBCUTANEOUS
  Filled 2018-03-17: qty 1

## 2018-03-17 MED ORDER — ENOXAPARIN SODIUM 40 MG/0.4ML ~~LOC~~ SOLN
40.0000 mg | SUBCUTANEOUS | Status: DC
  Administered 2018-03-17: 40 mg via SUBCUTANEOUS
  Filled 2018-03-17 (×2): qty 0.4

## 2018-03-17 MED ORDER — IPRATROPIUM BROMIDE 0.02 % IN SOLN
0.5000 mg | Freq: Four times a day (QID) | RESPIRATORY_TRACT | Status: DC
  Administered 2018-03-17 – 2018-03-18 (×4): 0.5 mg via RESPIRATORY_TRACT
  Filled 2018-03-17 (×4): qty 2.5

## 2018-03-17 MED ORDER — ONDANSETRON HCL 4 MG/2ML IJ SOLN: 4.0000 mg | Freq: Four times a day (QID) | INTRAMUSCULAR | Status: DC | PRN

## 2018-03-17 MED ORDER — IPRATROPIUM BROMIDE 0.02 % IN SOLN: 0.5000 mg | RESPIRATORY_TRACT | Status: DC

## 2018-03-17 MED ORDER — ALBUTEROL SULFATE (2.5 MG/3ML) 0.083% IN NEBU: 2.5000 mg | INHALATION_SOLUTION | RESPIRATORY_TRACT | Status: DC | PRN

## 2018-03-17 MED ORDER — ACETAMINOPHEN 325 MG PO TABS: 650.0000 mg | ORAL_TABLET | Freq: Four times a day (QID) | ORAL | Status: DC | PRN

## 2018-03-17 MED ORDER — SODIUM CHLORIDE 0.9 % IV BOLUS
1000.0000 mL | Freq: Once | INTRAVENOUS | Status: AC
  Administered 2018-03-17: 1000 mL via INTRAVENOUS

## 2018-03-17 MED ORDER — TRAZODONE HCL 50 MG PO TABS: 25.0000 mg | ORAL_TABLET | Freq: Every evening | ORAL | Status: DC | PRN

## 2018-03-17 MED ORDER — DM-GUAIFENESIN ER 30-600 MG PO TB12
1.0000 | ORAL_TABLET | Freq: Two times a day (BID) | ORAL | Status: DC | PRN
  Administered 2018-03-17: 1 via ORAL
  Filled 2018-03-17: qty 1

## 2018-03-17 MED ORDER — SENNOSIDES-DOCUSATE SODIUM 8.6-50 MG PO TABS: 1.0000 | ORAL_TABLET | Freq: Every evening | ORAL | Status: DC | PRN

## 2018-03-17 MED ORDER — LEVALBUTEROL HCL 1.25 MG/0.5ML IN NEBU
1.2500 mg | INHALATION_SOLUTION | Freq: Four times a day (QID) | RESPIRATORY_TRACT | Status: DC
  Administered 2018-03-17: 1.25 mg via RESPIRATORY_TRACT
  Filled 2018-03-17 (×2): qty 0.5

## 2018-03-17 MED ORDER — POTASSIUM CHLORIDE 20 MEQ/15ML (10%) PO SOLN
20.0000 meq | Freq: Once | ORAL | Status: AC
  Administered 2018-03-17: 20 meq via ORAL
  Filled 2018-03-17: qty 15

## 2018-03-17 MED ORDER — LEVALBUTEROL HCL 1.25 MG/0.5ML IN NEBU
1.2500 mg | INHALATION_SOLUTION | RESPIRATORY_TRACT | Status: DC
  Administered 2018-03-17 – 2018-03-18 (×7): 1.25 mg via RESPIRATORY_TRACT
  Filled 2018-03-17 (×9): qty 0.5

## 2018-03-17 MED ORDER — MONTELUKAST SODIUM 10 MG PO TABS: 10.0000 mg | ORAL_TABLET | Freq: Every day | ORAL | Status: DC

## 2018-03-17 MED ORDER — INSULIN ASPART 100 UNIT/ML ~~LOC~~ SOLN
0.0000 [IU] | Freq: Every day | SUBCUTANEOUS | Status: DC
Start: 2018-03-17 — End: 2018-03-18
  Administered 2018-03-17: 2 [IU] via SUBCUTANEOUS

## 2018-03-17 MED ORDER — HYDROCODONE-ACETAMINOPHEN 5-325 MG PO TABS: 1.0000 | ORAL_TABLET | ORAL | Status: DC | PRN

## 2018-03-17 MED ORDER — LORATADINE 10 MG PO TABS: 10.0000 mg | ORAL_TABLET | Freq: Every day | ORAL | Status: DC

## 2018-03-17 MED ORDER — ONDANSETRON HCL 4 MG PO TABS: 4.0000 mg | ORAL_TABLET | Freq: Four times a day (QID) | ORAL | Status: DC | PRN

## 2018-03-17 MED ORDER — ALBUTEROL (5 MG/ML) CONTINUOUS INHALATION SOLN
10.0000 mg/h | INHALATION_SOLUTION | RESPIRATORY_TRACT | Status: DC
  Administered 2018-03-17: 10 mg/h via RESPIRATORY_TRACT
  Filled 2018-03-17: qty 20

## 2018-03-17 NOTE — Progress Notes (Signed)
03/17/2018 patient came from the emergency room to 2W at 1428. He is alert, oriented and ambulatory. Patient skin is dry, Eczema was noted behind left ear, and neck. Discoloration on back of knees and in the Anticubical area. Patient was place of telemetry [facility].

## 2018-03-17 NOTE — ED Notes (Signed)
Pt eating crackers per provider

## 2018-03-17 NOTE — ED Provider Notes (Signed)
Patient has a history of reactive airway disease.  He has gotten nebulizers in the emergency department however when his oxygen is turned off his pulse ox drops to 86 to 88% on room air.  Patient states he is feeling better.  However and I listen to him he still has diffuse scattered rhonchi and some wheezing.  We discussed that he would need to be admitted and he is agreeable.  Medical screening examination/treatment/procedure(s) were conducted as a shared visit with non-physician practitioner(s) and myself.  I personally evaluated the patient during the encounter.  EKG Interpretation  Date/Time:  Friday March 16 2018 23:12:17 EDT Ventricular Rate:  121 PR Interval:    QRS Duration: 79 QT Interval:  300 QTC Calculation: 426 R Axis:   77 Text Interpretation:  Sinus tachycardia Right atrial enlargement Since last tracing rate slower 22 Nov 2016 Confirmed by Rolland Porter [phone]) on 03/16/2018 11:29:24 PM   Rolland Porter, MD, Barbette Or, MD 03/17/18 [phone]

## 2018-03-17 NOTE — ED Notes (Signed)
Heart healthy/carb modified lunch tray ordered 

## 2018-03-17 NOTE — ED Notes (Signed)
Walked patient to the bathroom patient did well 

## 2018-03-17 NOTE — Progress Notes (Signed)
Inpatient Diabetes Program Recommendations  AACE/ADA: New Consensus Statement on Inpatient Glycemic Control (2015)  Target Ranges:  Prepandial:   less than 140 mg/dL      Peak postprandial:   less than 180 mg/dL (1-2 hours)      Critically ill patients:  140 - 180 mg/dL   Results for LIMUEL, NIEBLAS (MRN [id number]) as of 03/17/2018 08:12  Ref. Range 01/04/2018 15:40 03/17/2018 03:42  Glucose Latest Ref Range: 65 - 99 mg/dL 106 (H) 263 (H)  Results for MILUS, FRITZE (MRN [id number]) as of 03/17/2018 08:12  Ref. Range 08/14/2016 08:09  Hemoglobin A1C Latest Ref Range: 4.8 - 5.6 % 5.4   Review of Glycemic Control  Diabetes history: No Outpatient Diabetes medications: NA Current orders for Inpatient glycemic control: Novolog 0-9 units TID with meals, Novolog 0-5 units QHS  Inpatient Diabetes Program Recommendations: HgbA1C: A1C has been ordered. Per MD note, patient received Solumedrol from EMS prior to arrival at the hospital which could be likely cause of initial glucose of 263 mg/dl. Recommend checking CBGs and using Novolog correction if needed but wait until A1C results to determine if patient has DM or not.   Insulin-Correction: Since patient is ordered Solumedrol 60 mg Q8H, anticipate glucose to be elevated. Please consider increasing Novolog correction to 0-15 units TID.  Insulin-Meal Coverage: If post prandial glucose is consistently elevated greater than 180 mg/dl and steroids are continued, please consider ordering Novolog 4 units TID with meals if patient eats at least 50% of meals.  Thanks, Barnie Alderman, RN, MSN, CDE Diabetes Coordinator Inpatient Diabetes Program [phone] (Team Pager from 8am to 5pm)

## 2018-03-17 NOTE — ED Notes (Signed)
Admitting provider bedside 

## 2018-03-17 NOTE — H&P (Signed)
History and Physical    Joseph Fitzgerald MLY:[id number] DOB: November 16, 1971 DOA: 03/16/2018  PCP: Ladell Pier, MD Patient coming from: home  Chief Complaint: sob  HPI: Joseph Fitzgerald is a very pleasant 47 y.o. male with medical history significant for  asthma persistent emergency department with a 2 day history of persistent worsening shortness of breath. Initial evaluation reveals acute respiratory failure with an oxygen saturation level 86% on room air. [facility] are asked to admit  Information is obtained from the patient and the chart. He states 2 days ago he developed sinus congestion sore throat he attributes to pollen outside. Joseph Fitzgerald developed worsening shortness of breath persistent productive cough with thick white sputum. He states he used his inhaler with no relief. He denies headache fever chills nausea vomiting abdominal pain. He denies chest pain palpitations syncope or near-syncope. He denies any dysuria hematuria frequency or urgency. He denies diarrhea constipation melena bright red blood per rectum.    ED Course: in the emergency department max temp 99.2 blood pressure is a high-end of normal is tachycardic with a heart rate of 120. Saturation level 86% on room air. He is provided with hour-long nebulizer Solu-Medrol and oxygen supplementation. At time of admission oxygen saturation level 95% on 3 L nasal cannula  Review of Systems: As per HPI otherwise all other systems reviewed and are negative.   Ambulatory Status: ambulates independently is independent with ADLs  Past Medical History:  Diagnosis Date  . Asthma   . Shortness of breath dyspnea     Past Surgical History:  Procedure Laterality Date  . NO PAST SURGERIES      Social History   Socioeconomic History  . Marital status: Single    Spouse name: Not on file  . Number of children: 3  . Years of education: 20  . Highest education level: Not on file  Occupational History  . Occupation: Pension scheme manager: [employer]  Social Needs  . Financial resource strain: Not on file  . Food insecurity:    Worry: Not on file    Inability: Not on file  . Transportation needs:    Medical: Not on file    Non-medical: Not on file  Tobacco Use  . Smoking status: Never Smoker  . Smokeless tobacco: Current User    Types: Chew  Substance and Sexual Activity  . Alcohol use: Yes    Comment: beer  . Drug use: No  . Sexual activity: Yes  Lifestyle  . Physical activity:    Days per week: Not on file    Minutes per session: Not on file  . Stress: Not on file  Relationships  . Social connections:    Talks on phone: Not on file    Gets together: Not on file    Attends religious service: Not on file    Active member of club or organization: Not on file    Attends meetings of clubs or organizations: Not on file    Relationship status: Not on file  . Intimate partner violence:    Fear of current or ex partner: Not on file    Emotionally abused: Not on file    Physically abused: Not on file    Forced sexual activity: Not on file  Other Topics Concern  . Not on file  Social History Narrative  . Not on file    Allergies  Allergen Reactions  . Penicillins Rash    Family History  Problem Relation Age of Onset  . Asthma Brother   . Diabetes Brother     Prior to Admission medications   Medication Sig Start Date End Date Taking? Authorizing Provider  albuterol (PROVENTIL HFA;VENTOLIN HFA) 108 (90 Base) MCG/ACT inhaler Inhale 2 puffs into the lungs every 4 (four) hours as needed for wheezing. 01/04/18  Yes Ladell Pier, MD  fluticasone (FLONASE) 50 MCG/ACT nasal spray Place 2 sprays into both nostrils daily. 01/04/18  Yes Ladell Pier, MD  fluticasone furoate-vilanterol (BREO ELLIPTA) 200-25 MCG/INH AEPB Inhale 1 puff into the lungs daily. 07/18/17  Yes Ladell Pier, MD  loratadine (CLARITIN) 10 MG tablet Take 1 tablet (10 mg total) by mouth daily. Patient not taking:  Reported on 03/17/2018 01/04/18   Ladell Pier, MD  montelukast (SINGULAIR) 10 MG tablet Take 1 tablet (10 mg total) by mouth at bedtime. Patient not taking: Reported on 03/17/2018 01/04/18   Ladell Pier, MD    Physical Exam: Vitals:   03/17/18 0345 03/17/18 0430 03/17/18 0515 03/17/18 0600  BP: 107/74 113/76 117/79 114/69  Pulse: (!) 102 93 94 89  Resp: 16 18 17  (!) 21  Temp:      TempSrc:      SpO2: 95% 96% 96% 95%     General:  Appears slightly anxious sitting on the side of the bed in no acute distress Eyes:  PERRL, EOMI, normal lids, iris ENT:  grossly normal hearing, lips & tongue, mucous membranes of his mouth are pink slightly dry Neck:  no LAD, masses or thyromegaly Cardiovascular:  Tachycardic but regular no m/r/g. No LE edema.  Respiratory:  Mild increased work of breathing with conversation. Breath sounds are quite diminished bilateral lower basis. I hear faint end expiratory wheezing in upper lobes. No crackles Abdomen:  soft, ntnd, positive bowel sounds throughout no guarding or rebounding Skin:  no rash or induration seen on limited exam Musculoskeletal:  grossly normal tone BUE/BLE, good ROM, no bony abnormality Psychiatric:  grossly normal mood and affect, speech fluent and appropriate, AOx3 Neurologic:  CN 2-12 grossly intact, moves all extremities in coordinated fashion, sensation intact  Labs on Admission: I have personally reviewed following labs and imaging studies  CBC: Recent Labs  Lab 03/17/18 0334 03/17/18 0342  WBC 11.7*  --   HGB 16.1 16.7  HCT 47.2 49.0  MCV 92.9  --   PLT 211  --    Basic Metabolic Panel: Recent Labs  Lab 03/17/18 0342  NA 138  K 3.3*  CL 101  GLUCOSE 263*  BUN 10  CREATININE 0.80   GFR: CrCl cannot be calculated (Unknown ideal weight.). Liver Function Tests: No results for input(s): AST, ALT, ALKPHOS, BILITOT, PROT, ALBUMIN in the last 168 hours. No results for input(s): LIPASE, AMYLASE in the last 168  hours. No results for input(s): AMMONIA in the last 168 hours. Coagulation Profile: No results for input(s): INR, PROTIME in the last 168 hours. Cardiac Enzymes: No results for input(s): CKTOTAL, CKMB, CKMBINDEX, TROPONINI in the last 168 hours. BNP (last 3 results) No results for input(s): PROBNP in the last 8760 hours. HbA1C: No results for input(s): HGBA1C in the last 72 hours. CBG: No results for input(s): GLUCAP in the last 168 hours. Lipid Profile: No results for input(s): CHOL, HDL, LDLCALC, TRIG, CHOLHDL, LDLDIRECT in the last 72 hours. Thyroid Function Tests: No results for input(s): TSH, T4TOTAL, FREET4, T3FREE, THYROIDAB in the last 72 hours. Anemia Panel: No results for input(s):  VITAMINB12, FOLATE, FERRITIN, TIBC, IRON, RETICCTPCT in the last 72 hours. Urine analysis: No results found for: COLORURINE, APPEARANCEUR, LABSPEC, PHURINE, GLUCOSEU, HGBUR, BILIRUBINUR, KETONESUR, PROTEINUR, UROBILINOGEN, NITRITE, LEUKOCYTESUR  Creatinine Clearance: CrCl cannot be calculated (Unknown ideal weight.).  Sepsis Labs: @LABRCNTIP (procalcitonin:4,lacticidven:4) )No results found for this or any previous visit (from the past 240 hour(s)).   Radiological Exams on Admission: Dg Chest 2 View  Result Date: 03/16/2018 CLINICAL DATA:  Dyspnea with wheeze EXAM: CHEST - 2 VIEW COMPARISON:  11/22/2016 FINDINGS: Hyperinflated lungs without acute pulmonary consolidation. Minimal atelectasis noted at the left lung base. Heart and mediastinal contours are stable. No acute osseous abnormality is seen. IMPRESSION: Redemonstration of mild pulmonary hyperinflation. Electronically Signed   By: Ashley Royalty M.D.   On: 03/16/2018 23:36    EKG: Sinus tachycardia Right atrial enlargement Since last tracing rate slower  Assessment/Plan Principal Problem:   Acute respiratory failure (HCC) Active Problems:   Asthma exacerbation   Class 1 obesity due to excess calories without serious comorbidity with  body mass index (BMI) of 30.0 to 30.9 in adult   Hypertension   Hypokalemia   Hyperglycemia   #1. Acute respiratory failure likely related to asthma exacerbation. Chest x-ray with redemonstration of mild pulmonary hyperinflation. Oxygen saturation level 86% on room air. He is provided with Solu-Medrol and nebulizers at time of admission oxygen saturation level 95% on 3 L nasal cannula -Admit to telemetry -Scheduled meds -solu-Medrol -oxygen supplementation -monitor oxygen saturation level -Wean oxygen as able  #2. Asthma exacerbation. Chart review indicates he was diagnosed 2001.rimary care provider note indicatesmoderate persistent. Interestingly also notes he moved to [place] from [place] and had no asthma [place]. Chest x-ray as noted above. Mild leukocytosis likely related to steroids. No fever. Home medications include albuterol inhaler, breo elipta and singular -See #1 -Outpatient follow-up  #3. Hypertension. Initially blood pressure 145/92. Likely related to tabs. No history of same. At time of admission blood pressure 117/73. -Monitor -Outpatient follow-up as indicated  #4. Hypokalemia. Mild. -Replete -Recheck  #5. Hyperglycemia. Patient does have siblings with diabetes. Serum glucose 263 on admission. -obtain hemoglobin A1c -Sliding scale insulin for optimal control -Outpatient follow-up    DVT prophylaxis: lovenox  Code Status: full  Family Communication: wife at bedside  Disposition Plan: home hopefully 24 hours  Consults called: none  Admission status: inpatient    Radene Gunning MD [facility]  If 7PM-7AM, please contact night-coverage [email or web address] Password [facility]  03/17/2018, 7:37 AM

## 2018-03-17 NOTE — ED Provider Notes (Signed)
[facility] EMERGENCY DEPARTMENT Provider Note   CSN: [id number] Arrival date & time: 03/16/18  2258     History   Chief Complaint Chief Complaint  Patient presents with  . Shortness of Breath  . Cough    HPI Joseph Fitzgerald is a 47 y.o. male.  Patient presents to the emergency department with a chief complaint of shortness of breath.  He has a history of asthma and allergies.  States that his symptoms started yesterday with a sore throat, cough, and rhinorrhea.  States that he began to have asthma exacerbation today.  He has tried taking an inhaler with no relief.  He was given albuterol and Solu-Medrol by EMS with some improvement.  Denies any measured fevers or chills, but questions subjective fever.  He denies any other associated symptoms.  The history is provided by the patient. No language interpreter was used.    Past Medical History:  Diagnosis Date  . Asthma   . Shortness of breath dyspnea     Patient Active Problem List   Diagnosis Date Noted  . Class 1 obesity due to excess calories without serious comorbidity with body mass index (BMI) of 30.0 to 30.9 in adult 01/04/2018  . Asthma, moderate persistent 08/17/2016  . Vitamin D deficiency 09/01/2015  . Family history of diabetes mellitus in brother 08/31/2015  . Eczema 08/31/2015  . Environmental allergies 04/16/2014    Past Surgical History:  Procedure Laterality Date  . NO PAST SURGERIES          Home Medications    Prior to Admission medications   Medication Sig Start Date End Date Taking? Authorizing Provider  albuterol (PROVENTIL HFA;VENTOLIN HFA) 108 (90 Base) MCG/ACT inhaler Inhale 2 puffs into the lungs every 4 (four) hours as needed for wheezing. 01/04/18  Yes Ladell Pier, MD  fluticasone (FLONASE) 50 MCG/ACT nasal spray Place 2 sprays into both nostrils daily. 01/04/18  Yes Ladell Pier, MD  fluticasone furoate-vilanterol (BREO ELLIPTA) 200-25 MCG/INH AEPB Inhale 1 puff  into the lungs daily. 07/18/17  Yes Ladell Pier, MD  ketotifen (ZADITOR) 0.025 % ophthalmic solution Place 1 drop into both eyes 2 (two) times daily. Patient not taking: Reported on 01/04/2018 03/10/17   Boykin Nearing, MD  loratadine (CLARITIN) 10 MG tablet Take 1 tablet (10 mg total) by mouth daily. Patient not taking: Reported on 03/17/2018 01/04/18   Ladell Pier, MD  montelukast (SINGULAIR) 10 MG tablet Take 1 tablet (10 mg total) by mouth at bedtime. Patient not taking: Reported on 03/17/2018 01/04/18   Ladell Pier, MD  triamcinolone ointment (KENALOG) 0.5 % Apply 1 application topically 2 (two) times daily. Apply to neck, arms and legs Patient not taking: Reported on 01/04/2018 06/12/17   Boykin Nearing, MD    Family History Family History  Problem Relation Age of Onset  . Asthma Brother   . Diabetes Brother     Social History Social History   Tobacco Use  . Smoking status: Never Smoker  . Smokeless tobacco: Current User    Types: Chew  Substance Use Topics  . Alcohol use: Yes    Comment: beer  . Drug use: No     Allergies   Penicillins   Review of Systems Review of Systems  All other systems reviewed and are negative.    Physical Exam Updated Vital Signs BP (!) 142/82   Pulse (!) 116   Temp 99.2 F (37.3 C) (Oral)   Resp Marland Kitchen)  21   SpO2 91%   Physical Exam  Constitutional: He is oriented to person, place, and time. He appears well-developed and well-nourished.  HENT:  Head: Normocephalic and atraumatic.  Eyes: Pupils are equal, round, and reactive to light. Conjunctivae and EOM are normal. Right eye exhibits no discharge. Left eye exhibits no discharge. No scleral icterus.  Neck: Normal range of motion. Neck supple. No JVD present.  Cardiovascular: Normal rate, regular rhythm and normal heart sounds. Exam reveals no gallop and no friction rub.  No murmur heard. Pulmonary/Chest: Effort normal. No respiratory distress. He has wheezes. He has  no rales. He exhibits no tenderness.  Abdominal: Soft. He exhibits no distension and no mass. There is no tenderness. There is no rebound and no guarding.  Musculoskeletal: Normal range of motion. He exhibits no edema or tenderness.  Neurological: He is alert and oriented to person, place, and time.  Skin: Skin is warm and dry.  Psychiatric: He has a normal mood and affect. His behavior is normal. Judgment and thought content normal.  Nursing note and vitals reviewed.    ED Treatments / Results  Labs (all labs ordered are listed, but only abnormal results are displayed) Labs Reviewed - No data to display  EKG EKG Interpretation  Date/Time:  Friday March 16 2018 23:12:17 EDT Ventricular Rate:  121 PR Interval:    QRS Duration: 79 QT Interval:  300 QTC Calculation: 426 R Axis:   77 Text Interpretation:  Sinus tachycardia Right atrial enlargement Since last tracing rate slower 22 Nov 2016 Confirmed by Rolland Porter [phone]) on 03/16/2018 11:29:24 PM   Radiology Dg Chest 2 View  Result Date: 03/16/2018 CLINICAL DATA:  Dyspnea with wheeze EXAM: CHEST - 2 VIEW COMPARISON:  11/22/2016 FINDINGS: Hyperinflated lungs without acute pulmonary consolidation. Minimal atelectasis noted at the left lung base. Heart and mediastinal contours are stable. No acute osseous abnormality is seen. IMPRESSION: Redemonstration of mild pulmonary hyperinflation. Electronically Signed   By: Ashley Royalty M.D.   On: 03/16/2018 23:36    Procedures Procedures (including critical care time)  Medications Ordered in ED Medications  albuterol (PROVENTIL) (2.5 MG/3ML) 0.083% nebulizer solution 5 mg (5 mg Nebulization Not Given 03/17/18 0049)  albuterol (PROVENTIL,VENTOLIN) solution continuous neb (0 mg/hr Nebulization Stopped 03/17/18 0210)  magnesium sulfate IVPB 2 g 50 mL (0 g Intravenous Stopped 03/17/18 0152)     Initial Impression / Assessment and Plan / ED Course  I have reviewed the triage vital signs and the  nursing notes.  Pertinent labs & imaging results that were available during my care of the patient were reviewed by me and considered in my medical decision making (see chart for details).      Patient with wheezing and SOB.  DDx includes, but is not limited to asthma exacerbation, URI, pneumonia.  Afebrile.  Chest x-ray is negative for infiltrate.  He does have an oxygen requirement at this time.  We will give repeat breathing treatments and likely continuous albuterol treatment.  He was given 125 of Solu-Medrol prior to arrival.  We will give 2 g magnesium.  He is able to speak in complete sentences and does not have accessory muscle use at this time.  After continuous albuterol treatment, patient continues to have wheezing.  O2 sat drops to the mid 80s on RA at rest.  Will need admission.  Seen by and discussed with Dr. Tomi Bamberger.  Appreciate Dr. Blaine Hamper for bringing patient into the hospital.  Final Clinical Impressions(s) / ED  Diagnoses   Final diagnoses:  Exacerbation of asthma, unspecified asthma severity, unspecified whether persistent    ED Discharge Orders    None       Montine Circle, PA-C 03/17/18 3748    Rolland Porter, MD 03/17/18 [phone]

## 2018-03-17 NOTE — ED Notes (Signed)
RT called to administer xopenex neb. States they will be here shortly to give.

## 2018-03-17 NOTE — ED Notes (Signed)
PA requested we ambulate pt monitoring O2 however as we attempted to remove O2 he immediately dropped to 86%. Placed back on 2L O2.

## 2018-03-17 NOTE — ED Notes (Signed)
Gave provider water per Provider

## 2018-03-17 NOTE — Care Management (Signed)
This is a no charge note  Pending admission per PA, Joseph Fitzgerald  47 year old male with past medical history of asthma, who presents with asthma exacerbation, oxygen saturation to 86% on room air. Chest x-ray negative. Placed on telemetry bed for observation.   [name], MD  [facility] Pager [phone]  If 7PM-7AM, please contact night-coverage [email or web address] Password [facility] 03/17/2018, 4:34 AM

## 2018-03-18 DIAGNOSIS — J9601 Acute respiratory failure with hypoxia: Principal | ICD-10-CM

## 2018-03-18 DIAGNOSIS — R030 Elevated blood-pressure reading, without diagnosis of hypertension: Secondary | ICD-10-CM

## 2018-03-18 DIAGNOSIS — J4541 Moderate persistent asthma with (acute) exacerbation: Secondary | ICD-10-CM

## 2018-03-18 DIAGNOSIS — R739 Hyperglycemia, unspecified: Secondary | ICD-10-CM

## 2018-03-18 LAB — BASIC METABOLIC PANEL
ANION GAP: 10 (ref 5–15)
BUN: 13 mg/dL (ref 6–20)
CO2: 23 mmol/L (ref 22–32)
Calcium: 8.7 mg/dL — ABNORMAL LOW (ref 8.9–10.3)
Chloride: 103 mmol/L (ref 101–111)
Creatinine, Ser: 0.82 mg/dL (ref 0.61–1.24)
GFR calc Af Amer: >60 mL/min (ref >60)
Glucose, Bld: 165 mg/dL — ABNORMAL HIGH (ref 65–99)
POTASSIUM: 3.9 mmol/L (ref 3.5–5.1)
Sodium: 136 mmol/L (ref 135–145)

## 2018-03-18 LAB — GLUCOSE, CAPILLARY
GLUCOSE-CAPILLARY: 132 mg/dL — AB (ref 65–99)
Glucose-Capillary: 123 mg/dL — ABNORMAL HIGH (ref 65–99)

## 2018-03-18 LAB — HIV ANTIBODY (ROUTINE TESTING W REFLEX): HIV SCREEN 4TH GENERATION: NONREACTIVE

## 2018-03-18 MED ORDER — PREDNISONE 50 MG PO TABS: 50.0000 mg | ORAL_TABLET | Freq: Every day | ORAL | 0 refills | Status: AC

## 2018-03-18 MED ORDER — ALBUTEROL SULFATE HFA 108 (90 BASE) MCG/ACT IN AERS: INHALATION_SPRAY | RESPIRATORY_TRACT | 1 refills | Status: DC

## 2018-03-18 MED ORDER — DM-GUAIFENESIN ER 30-600 MG PO TB12: 1.0000 | ORAL_TABLET | Freq: Two times a day (BID) | ORAL | 0 refills | Status: DC | PRN

## 2018-03-18 NOTE — Progress Notes (Signed)
Patient is alert and oriented.  Patient ambulates to bathroom without difficulty.  Meds given per order.  Patient resting comfortably on room air at this time.  Willl continue to monitor.  Call bell within reach.

## 2018-03-18 NOTE — Progress Notes (Addendum)
Patient discharged to home via bus pass. VSS no c/o pain. Discharge instructions reviewed with patient, teachback displayed, no additional questions. All belongings returned to patient.

## 2018-03-18 NOTE — Discharge Instructions (Signed)
Asthma, Acute Bronchospasm Acute bronchospasm caused by asthma is also referred to as an asthma attack. Bronchospasm means your air passages become narrowed. The narrowing is caused by inflammation and tightening of the muscles in the air tubes (bronchi) in your lungs. This can make it hard to breathe or cause you to wheeze and cough. What are the causes? Possible triggers are:  Animal dander from the skin, hair, or feathers of animals.  Dust mites contained in house dust.  Cockroaches.  Pollen from trees or grass.  Mold.  Cigarette or tobacco smoke.  Air pollutants such as dust, household cleaners, hair sprays, aerosol sprays, paint fumes, strong chemicals, or strong odors.  Cold air or weather changes. Cold air may trigger inflammation. Winds increase molds and pollens in the air.  Strong emotions such as crying or laughing hard.  Stress.  Certain medicines such as aspirin or beta-blockers.  Sulfites in foods and drinks, such as dried fruits and wine.  Infections or inflammatory conditions, such as a flu, cold, or inflammation of the nasal membranes (rhinitis).  Gastroesophageal reflux disease (GERD). GERD is a condition where stomach acid backs up into your esophagus.  Exercise or strenuous activity.  What are the signs or symptoms?  Wheezing.  Excessive coughing, particularly at night.  Chest tightness.  Shortness of breath. How is this diagnosed? Your health care provider will ask you about your medical history and perform a physical exam. A chest X-ray or blood testing may be performed to look for other causes of your symptoms or other conditions that may have triggered your asthma attack. How is this treated? Treatment is aimed at reducing inflammation and opening up the airways in your lungs. Most asthma attacks are treated with inhaled medicines. These include quick relief or rescue medicines (such as bronchodilators) and controller medicines (such as inhaled  corticosteroids). These medicines are sometimes given through an inhaler or a nebulizer. Systemic steroid medicine taken by mouth or given through an IV tube also can be used to reduce the inflammation when an attack is moderate or severe. Antibiotic medicines are only used if a bacterial infection is present. Follow these instructions at home:  Rest.  Drink plenty of liquids. This helps the mucus to remain thin and be easily coughed up. Only use caffeine in moderation and do not use alcohol until you have recovered from your illness.  Do not smoke. Avoid being exposed to secondhand smoke.  You play a critical role in keeping yourself in good health. Avoid exposure to things that cause you to wheeze or to have breathing problems.  Keep your medicines up-to-date and available. Carefully follow your health care providers treatment plan.  Take your medicine exactly as prescribed.  When pollen or pollution is bad, keep windows closed and use an air conditioner or go to places with air conditioning.  Asthma requires careful medical care. See your health care provider for a follow-up as advised. If you are more than [redacted] weeks pregnant and you were prescribed any new medicines, let your obstetrician know about the visit and how you are doing. Follow up with your health care provider as directed.  After you have recovered from your asthma attack, make an appointment with your outpatient doctor to talk about ways to reduce the likelihood of future attacks. If you do not have a doctor who manages your asthma, make an appointment with a primary care doctor to discuss your asthma. Get help right away if:  You are getting worse.  You have trouble breathing. If severe, call your local emergency services (911 in the [place]  You develop chest pain or discomfort.  You are vomiting.  You are not able to keep fluids down.  You are coughing up yellow, green, brown, or bloody sputum.  You have a fever  and your symptoms suddenly get worse.  You have trouble swallowing. This information is not intended to replace advice given to you by your health care provider. Make sure you discuss any questions you have with your health care provider. Document Released: 03/01/2007 Document Revised: 04/27/2016 Document Reviewed: 05/22/2013 Elsevier Interactive Patient Education  2017 [employer].

## 2018-03-18 NOTE — Progress Notes (Signed)
Inpatient Diabetes Program Recommendations  AACE/ADA: New Consensus Statement on Inpatient Glycemic Control (2015)  Target Ranges:  Prepandial:   less than 140 mg/dL      Peak postprandial:   less than 180 mg/dL (1-2 hours)      Critically ill patients:  140 - 180 mg/dL   Results for Joseph Fitzgerald, Joseph Fitzgerald (MRN [id number]) as of 03/18/2018 08:20  Ref. Range 03/17/2018 11:53 03/17/2018 16:50 03/17/2018 21:01 03/18/2018 07:45  Glucose-Capillary Latest Ref Range: 65 - 99 mg/dL 129 (H) 116 (H) 238 (H) 132 (H)  Results for Joseph Fitzgerald, Joseph Fitzgerald (MRN [id number]) as of 03/18/2018 08:20  Ref. Range 03/17/2018 03:34  Hemoglobin A1C Latest Ref Range: 4.8 - 5.6 % 5.1   Review of Glycemic Control  Diabetes history: No Outpatient Diabetes medications: NA Current orders for Inpatient glycemic control: Novolog 0-15 units TID with meals, Novolog 0-5 units QHS; Solumedrol 60 mg Q8H  Inpatient Diabetes Program Recommendations:  HgbA1C: A1C 5.1% on 03/17/18. Patient does not have DM and anticipate initial hyperglycemia noted admission was due to getting Solumedrol by EMS prior to arrival at the hospital. Patient is current ordered Solumedrol as an inpatient which is contributing to hyperglycemia. Agree with current orders at this time.  Thanks, Barnie Alderman, RN, MSN, CDE Diabetes Coordinator Inpatient Diabetes Program [phone] (Team Pager from 8am to 5pm)

## 2018-03-18 NOTE — Progress Notes (Signed)
SATURATION QUALIFICATIONS: (This note is used to comply with regulatory documentation for home oxygen)  Patient Saturations on Room Air at Rest = 96%  Patient Saturations on Room Air while Ambulating = 92%  Patient Saturations on 0 Liters of oxygen while Ambulating = 92%  Please briefly explain why patient needs home oxygen: Patient does not need home oxygen

## 2018-03-18 NOTE — Discharge Summary (Addendum)
Physician Discharge Summary  Joseph Fitzgerald VBT:[id number] DOB: [birth year] DOA: 03/16/2018  PCP: Ladell Pier, MD  Admit date: 03/16/2018 Discharge date: 03/18/2018  Admitted From: Home Disposition: Home  Recommendations for Outpatient Follow-up:  1. Follow up with PCP in 2-3 days 2. Please obtain BMP/CBC in one week 3. Please follow up on the following pending results: None  Home Health: None Equipment/Devices: None  Discharge Condition: Stable CODE STATUS: Full code Diet recommendation: Heart healthy   Brief/Interim Summary:  Admission HPI written by Dyanne Carrel, NP   Chief Complaint: sob  HPI: Joseph Fitzgerald is a very pleasant 47 y.o. male with medical history significant for  asthma persistent emergency department with a 2 day history of persistent worsening shortness of breath. Initial evaluation reveals acute respiratory failure with an oxygen saturation level 86% on room air. [facility] are asked to admit  Information is obtained from the patient and the chart. He states 2 days ago he developed sinus congestion sore throat he attributes to pollen outside. Hubbard Robinson developed worsening shortness of breath persistent productive cough with thick white sputum. He states he used his inhaler with no relief. He denies headache fever chills nausea vomiting abdominal pain. He denies chest pain palpitations syncope or near-syncope. He denies any dysuria hematuria frequency or urgency. He denies diarrhea constipation melena bright red blood per rectum.    ED Course: in the emergency department max temp 99.2 blood pressure is a high-end of normal is tachycardic with a heart rate of 120. Saturation level 86% on room air. He is provided with hour-long nebulizer Solu-Medrol and oxygen supplementation. At time of admission oxygen saturation level 95% on 3 L nasal cannula   Hospital course:  Acute respiratory failure with hypoxia Asthma exacerbation Treated with  steroids and nebulizer treatments. Improved significantly, weaning to room air and tolerated ambulation on room air. Patient to continue scheduled bronchodilator treatment for the next three days in addition to prednisone burst. Still with wheezing, however, no distress or cough. At baseline.  Hyperglycemia Hemoglobin A1C of 5.1%. Likely secondary to steroids.  Elevated blood pressure Transient. Normotensive. Routine outpatient screening.  Hypokalemia Given supplementation.  Overweight Body mass index is 29.28 kg/m. Not obese.   Discharge Diagnoses:  Principal Problem:   Acute respiratory failure ([place]) Active Problems:   Asthma exacerbation   Transient elevated blood pressure   Hypokalemia   Hyperglycemia    Discharge Instructions  Discharge Instructions    Diet - low sodium heart healthy   Complete by:  As directed    Increase activity slowly   Complete by:  As directed      Allergies as of 03/18/2018      Reactions   Penicillins Rash      Medication List    TAKE these medications   albuterol 108 (90 Base) MCG/ACT inhaler Commonly known as:  PROVENTIL HFA;VENTOLIN HFA 2 puffs every 4 hours while awake for three days, then take 2 puffs every 4 hours as needed for shortness of breath or wheezing What changed:    how much to take  how to take this  when to take this  reasons to take this  additional instructions   dextromethorphan-guaiFENesin 30-600 MG 12hr tablet Commonly known as:  MUCINEX DM Take 1 tablet by mouth 2 (two) times daily as needed for cough.   fluticasone 50 MCG/ACT nasal spray Commonly known as:  FLONASE Place 2 sprays into both nostrils daily.   fluticasone furoate-vilanterol 200-25 MCG/INH Aepb Commonly  known as:  BREO ELLIPTA Inhale 1 puff into the lungs daily.   loratadine 10 MG tablet Commonly known as:  CLARITIN Take 1 tablet (10 mg total) by mouth daily.   montelukast 10 MG tablet Commonly known as:  SINGULAIR Take 1  tablet (10 mg total) by mouth at bedtime.   predniSONE 50 MG tablet Commonly known as:  DELTASONE Take 1 tablet (50 mg total) by mouth daily with breakfast for 4 days. Start taking on:  03/19/2018      Follow-up Information    Ladell Pier, MD. Schedule an appointment as soon as possible for a visit in 3 day(s).   Specialty:  Internal Medicine Contact information: [address] [place] [address] [phone]          Allergies  Allergen Reactions  . Penicillins Rash    Consultations:  Diabetes coordinator   Procedures/Studies: Dg Chest 2 View  Result Date: 03/16/2018 CLINICAL DATA:  Dyspnea with wheeze EXAM: CHEST - 2 VIEW COMPARISON:  11/22/2016 FINDINGS: Hyperinflated lungs without acute pulmonary consolidation. Minimal atelectasis noted at the left lung base. Heart and mediastinal contours are stable. No acute osseous abnormality is seen. IMPRESSION: Redemonstration of mild pulmonary hyperinflation. Electronically Signed   By: Ashley Royalty M.D.   On: 03/16/2018 23:36      Subjective: No cough. Breathing better.  Discharge Exam: Vitals:   03/18/18 0745 03/18/18 1133  BP: 130/77   Pulse: (!) 57   Resp: 18   Temp: (!) 97.4 F (36.3 C)   SpO2: 92% 92%   Vitals:   03/18/18 0308 03/18/18 0734 03/18/18 0745 03/18/18 1133  BP:   130/77   Pulse:   (!) 57   Resp:   18   Temp:   (!) 97.4 F (36.3 C)   TempSrc:   Oral   SpO2: 96% 92% 92% 92%  Weight:      Height:        General: Pt is alert, awake, not in acute distress Cardiovascular: RRR, S1/S2 +, no rubs, no gallops Respiratory: Diffuse wheezing with good air movement Abdominal: Soft, NT, ND, bowel sounds + Extremities: no edema, no cyanosis    The results of significant diagnostics from this hospitalization (including imaging, microbiology, ancillary and laboratory) are listed below for reference.     Microbiology: Recent Results (from the past 240 hour(s))  MRSA PCR Screening      Status: None   Collection Time: 03/17/18  4:15 PM  Result Value Ref Range Status   MRSA by PCR NEGATIVE NEGATIVE Final    Comment:        The GeneXpert MRSA Assay (FDA approved for NASAL specimens only), is one component of a comprehensive MRSA colonization surveillance program. It is not intended to diagnose MRSA infection nor to guide or monitor treatment for MRSA infections. Performed at [facility], [place] [address], [place], [place] [address]      Labs: Basic Metabolic Panel: Recent Labs  Lab 03/17/18 0342 03/17/18 0942 03/18/18 0320  NA 138  --  136  K 3.3*  --  3.9  CL 101  --  103  CO2  --   --  23  GLUCOSE 263*  --  165*  BUN 10  --  13  CREATININE 0.80  --  0.82  CALCIUM  --   --  8.7*  MG  --  2.4  --    CBC: Recent Labs  Lab 03/17/18 0334 03/17/18 0342  WBC 11.7*  --   HGB 16.1 16.7  HCT 47.2 49.0  MCV 92.9  --   PLT 211  --    CBG: Recent Labs  Lab 03/17/18 1153 03/17/18 1650 03/17/18 2101 03/18/18 0745  GLUCAP 129* 116* 238* 132*   Hgb A1c Recent Labs    03/17/18 0334  HGBA1C 5.1   Microbiology Recent Results (from the past 240 hour(s))  MRSA PCR Screening     Status: None   Collection Time: 03/17/18  4:15 PM  Result Value Ref Range Status   MRSA by PCR NEGATIVE NEGATIVE Final    Comment:        The GeneXpert MRSA Assay (FDA approved for NASAL specimens only), is one component of a comprehensive MRSA colonization surveillance program. It is not intended to diagnose MRSA infection nor to guide or monitor treatment for MRSA infections. Performed at [facility], [place] [address] [place], [place] [address]      SIGNED:   Cordelia Poche, MD [facility] 03/18/2018, 11:59 AM Pager [phone]  If 7PM-7AM, please contact night-coverage [email or web address] Password [email or web address]

## 2018-03-19 MED FILL — predniSONE 10 MG TABS: 10 | 4 days supply | Qty: 20 | Fill #0

## 2018-03-19 MED FILL — ALBUTEROL SULFATE HFA 108 (: 108 (90 BAS | 16 days supply | Qty: 18 | Fill #0

## 2018-03-26 ENCOUNTER — Encounter: Payer: Self-pay | Admitting: Internal Medicine

## 2018-03-26 ENCOUNTER — Ambulatory Visit: Payer: Self-pay | Attending: Internal Medicine | Admitting: Internal Medicine

## 2018-03-26 VITALS — BP 111/83 | HR 62 | Temp 98.1°F | Resp 16 | Wt 166.6 lb

## 2018-03-26 DIAGNOSIS — R0902 Hypoxemia: Secondary | ICD-10-CM | POA: Insufficient documentation

## 2018-03-26 DIAGNOSIS — E669 Obesity, unspecified: Secondary | ICD-10-CM | POA: Insufficient documentation

## 2018-03-26 DIAGNOSIS — Z9109 Other allergy status, other than to drugs and biological substances: Secondary | ICD-10-CM

## 2018-03-26 DIAGNOSIS — L309 Dermatitis, unspecified: Secondary | ICD-10-CM | POA: Insufficient documentation

## 2018-03-26 DIAGNOSIS — R739 Hyperglycemia, unspecified: Secondary | ICD-10-CM | POA: Insufficient documentation

## 2018-03-26 DIAGNOSIS — T50905A Adverse effect of unspecified drugs, medicaments and biological substances, initial encounter: Secondary | ICD-10-CM | POA: Insufficient documentation

## 2018-03-26 DIAGNOSIS — I10 Essential (primary) hypertension: Secondary | ICD-10-CM | POA: Insufficient documentation

## 2018-03-26 DIAGNOSIS — Z79899 Other long term (current) drug therapy: Secondary | ICD-10-CM | POA: Insufficient documentation

## 2018-03-26 DIAGNOSIS — J4540 Moderate persistent asthma, uncomplicated: Secondary | ICD-10-CM | POA: Insufficient documentation

## 2018-03-26 MED ORDER — ALBUTEROL SULFATE (2.5 MG/3ML) 0.083% IN NEBU: 2.5000 mg | INHALATION_SOLUTION | Freq: Four times a day (QID) | RESPIRATORY_TRACT | 1 refills | Status: DC | PRN

## 2018-03-26 MED ORDER — ALBUTEROL SULFATE HFA 108 (90 BASE) MCG/ACT IN AERS
INHALATION_SPRAY | RESPIRATORY_TRACT | 11 refills | Status: DC
Start: 2018-03-26 — End: 2018-03-26

## 2018-03-26 MED ORDER — MONTELUKAST SODIUM 10 MG PO TABS: 10.0000 mg | ORAL_TABLET | Freq: Every day | ORAL | 11 refills | Status: DC

## 2018-03-26 MED ORDER — LORATADINE 10 MG PO TABS: 10.0000 mg | ORAL_TABLET | Freq: Every day | ORAL | 11 refills | Status: DC

## 2018-03-26 MED ORDER — ALBUTEROL SULFATE HFA 108 (90 BASE) MCG/ACT IN AERS: INHALATION_SPRAY | RESPIRATORY_TRACT | 11 refills | Status: DC

## 2018-03-26 MED FILL — $VENTOLIN HFA 18G INHALER: 108 (90 BAS | 48 days supply | Qty: 54 | Fill #0

## 2018-03-26 MED FILL — MONTELUKAST SOD 10 MG TAB: 10 | 30 days supply | Qty: 30 | Fill #0

## 2018-03-26 MED FILL — ALBUTEROL 0.083% INHAL SOLN: (2.5 MG/3ML | 15 days supply | Qty: 180 | Fill #0

## 2018-03-26 NOTE — Progress Notes (Signed)
Patient ID: Joseph Fitzgerald, male    DOB: 04/21/1971  MRN: [id number]  CC: Hospitalization Follow-up   Subjective: Joseph Fitzgerald is a 47 y.o. male who presents for hospital follow-up His concerns today include:  Pt with mod persistent asthma, obesity, environmental allergies and eczema  1.  Patient hospitalized 4/19-21/2019 with asthma exacerbation that started with upper respiratory symptoms.  Patient was hypoxic.  He required steroids and nebulizer treatments with rapid improvement.  He was noted to have elevated blood sugars and blood pressure while on steroids.  Blood pressure normalized.  A1c was 5.1. Today: Feeling better since hosp.  Reports compliance with Breo. Completed Prednisone.  Using Albuterol as needed; did not have to use at all yesterday.  Compliant with Flonase which he uses every morning with a Claritin.  Out of Singulair but has RFs on current rxn -endorses rhinorrhea and itchy eyes -he does not smoke  Patient Active Problem List   Diagnosis Date Noted  . Transient elevated blood pressure 03/17/2018  . Hypokalemia 03/17/2018  . Hyperglycemia 03/17/2018  . Hypoxia   . New onset type 2 diabetes mellitus ([place])   . Asthma exacerbation 11/23/2016  . Asthma, moderate persistent 08/17/2016  . Vitamin D deficiency 09/01/2015  . Family history of diabetes mellitus in brother 08/31/2015  . Eczema 08/31/2015  . Environmental allergies 04/16/2014  . Acute respiratory failure ([place]) 06/12/2013     Current Outpatient Medications on File Prior to Visit  Medication Sig Dispense Refill  . albuterol (PROVENTIL HFA;VENTOLIN HFA) 108 (90 Base) MCG/ACT inhaler 2 puffs every 4 hours while awake for three days, then take 2 puffs every 4 hours as needed for shortness of breath or wheezing 1 Inhaler 1  . dextromethorphan-guaiFENesin (MUCINEX DM) 30-600 MG 12hr tablet Take 1 tablet by mouth 2 (two) times daily as needed for cough. 10 tablet 0  . fluticasone (FLONASE) 50 MCG/ACT nasal  spray Place 2 sprays into both nostrils daily. 16 g 6  . fluticasone furoate-vilanterol (BREO ELLIPTA) 200-25 MCG/INH AEPB Inhale 1 puff into the lungs daily. 180 each 3  . loratadine (CLARITIN) 10 MG tablet Take 1 tablet (10 mg total) by mouth daily. (Patient not taking: Reported on 03/17/2018) 30 tablet 11  . montelukast (SINGULAIR) 10 MG tablet Take 1 tablet (10 mg total) by mouth at bedtime. (Patient not taking: Reported on 03/17/2018) 30 tablet 11   No current facility-administered medications on file prior to visit.     Allergies  Allergen Reactions  . Penicillins Rash    Social History   Socioeconomic History  . Marital status: Single    Spouse name: Not on file  . Number of children: 3  . Years of education: 29  . Highest education level: Not on file  Occupational History  . Occupation: [employer]  . Financial resource strain: Not on file  . Food insecurity:    Worry: Not on file    Inability: Not on file  . Transportation needs:    Medical: Not on file    Non-medical: Not on file  Tobacco Use  . Smoking status: Never Smoker  . Smokeless tobacco: Current User    Types: Chew  Substance and Sexual Activity  . Alcohol use: Yes    Comment: beer  . Drug use: No  . Sexual activity: Yes  Lifestyle  . Physical activity:    Days per week: 0 days    Minutes per session: 0 min  . Stress: Not  at all  Relationships  . Social connections:    Talks on phone: Twice a week    Gets together: Twice a week    Attends religious service: More than 4 times per year    Active member of club or organization: No    Attends meetings of clubs or organizations: Never    Relationship status: Married  . Intimate partner violence:    Fear of current or ex partner: No    Emotionally abused: No    Physically abused: No    Forced sexual activity: No  Other Topics Concern  . Not on file  Social History Narrative  . Not on file    Family History  Problem Relation Age of  Onset  . Asthma Brother   . Diabetes Brother     Past Surgical History:  Procedure Laterality Date  . NO PAST SURGERIES      ROS: Review of Systems Neg except as above PHYSICAL EXAM: BP 111/83   Pulse 62   Temp 98.1 F (36.7 C) (Oral)   Resp 16   Wt 166 lb 9.6 oz (75.6 kg)   SpO2 96%   BMI 30.47 kg/m   Physical Exam General appearance - alert, well appearing, and in no distress Mental status - alert, oriented to person, place, and time, normal mood, behavior, speech, dress, motor activity, and thought processes Mouth - mucous membranes moist, pharynx normal without lesions Neck - supple, no significant adenopathy Chest - clear to auscultation, no wheezes, rales or rhonchi, symmetric air entry Heart - normal rate, regular rhythm, normal S1, S2, no murmurs, rubs, clicks or gallops  Results for orders placed or performed during the hospital encounter of 03/16/18  MRSA PCR Screening  Result Value Ref Range   MRSA by PCR NEGATIVE NEGATIVE  CBC  Result Value Ref Range   WBC 11.7 (H) 4.0 - 10.5 K/uL   RBC 5.08 4.22 - 5.81 MIL/uL   Hemoglobin 16.1 13.0 - 17.0 g/dL   HCT 47.2 39.0 - 52.0 %   MCV 92.9 78.0 - 100.0 fL   MCH 31.7 26.0 - 34.0 pg   MCHC 34.1 30.0 - 36.0 g/dL   RDW 12.4 11.5 - 15.5 %   Platelets 211 150 - 400 K/uL  Influenza panel by PCR (type A & B)  Result Value Ref Range   Influenza A By PCR NEGATIVE NEGATIVE   Influenza B By PCR NEGATIVE NEGATIVE  HIV antibody (Routine Testing)  Result Value Ref Range   HIV Screen 4th Generation wRfx Non Reactive Non Reactive  Magnesium  Result Value Ref Range   Magnesium 2.4 1.7 - 2.4 mg/dL  Hemoglobin A1c  Result Value Ref Range   Hgb A1c MFr Bld 5.1 4.8 - 5.6 %   Mean Plasma Glucose 99.67 mg/dL  Glucose, capillary  Result Value Ref Range   Glucose-Capillary 116 (H) 65 - 99 mg/dL  Basic metabolic panel  Result Value Ref Range   Sodium 136 135 - 145 mmol/L   Potassium 3.9 3.5 - 5.1 mmol/L   Chloride 103 101 -  111 mmol/L   CO2 23 22 - 32 mmol/L   Glucose, Bld 165 (H) 65 - 99 mg/dL   BUN 13 6 - 20 mg/dL   Creatinine, Ser 0.82 0.61 - 1.24 mg/dL   Calcium 8.7 (L) 8.9 - 10.3 mg/dL   GFR calc non Af Amer >60 >60 mL/min   GFR calc Af Amer >60 >60 mL/min   Anion gap 10  5 - 15  Glucose, capillary  Result Value Ref Range   Glucose-Capillary 238 (H) 65 - 99 mg/dL  Glucose, capillary  Result Value Ref Range   Glucose-Capillary 132 (H) 65 - 99 mg/dL  Glucose, capillary  Result Value Ref Range   Glucose-Capillary 123 (H) 65 - 99 mg/dL  I-stat chem 8, ed  Result Value Ref Range   Sodium 138 135 - 145 mmol/L   Potassium 3.3 (L) 3.5 - 5.1 mmol/L   Chloride 101 101 - 111 mmol/L   BUN 10 6 - 20 mg/dL   Creatinine, Ser 0.80 0.61 - 1.24 mg/dL   Glucose, Bld 263 (H) 65 - 99 mg/dL   Calcium, Ion 1.01 (L) 1.15 - 1.40 mmol/L   TCO2 21 (L) 22 - 32 mmol/L   Hemoglobin 16.7 13.0 - 17.0 g/dL   HCT 49.0 39.0 - 52.0 %  CBG monitoring, ED  Result Value Ref Range   Glucose-Capillary 129 (H) 65 - 99 mg/dL    ASSESSMENT AND PLAN: 1. Moderate persistent asthma without complication -Recommend use of PEAK flow daily to est baseline.  He misplaced the one we gave him 6 mths ago. Given neb machine and liquid treatments to go along with it. Continue Breo Encourage patient to apply for the orange card of [facility] discount so that we can refer to pulmonary/allergy immunology - albuterol (PROVENTIL HFA;VENTOLIN HFA) 108 (90 Base) MCG/ACT inhaler; 2 puffs every 4 hours while awake for three days, then take 2 puffs every 4 hours as needed for shortness of breath or wheezing  Dispense: 1 Inhaler; Refill: 11 - loratadine (CLARITIN) 10 MG tablet; Take 1 tablet (10 mg total) by mouth daily.  Dispense: 30 tablet; Refill: 11 - montelukast (SINGULAIR) 10 MG tablet; Take 1 tablet (10 mg total) by mouth at bedtime.  Dispense: 30 tablet; Refill: 11 - albuterol (PROVENTIL) (2.5 MG/3ML) 0.083% nebulizer solution; Take 3 mLs (2.5 mg total)  by nebulization every 6 (six) hours as needed for wheezing or shortness of breath.  Dispense: 150 mL; Refill: 1  2. Environmental allergies Continue Claritin, Flonase and Singulair - loratadine (CLARITIN) 10 MG tablet; Take 1 tablet (10 mg total) by mouth daily.  Dispense: 30 tablet; Refill: 11  3. Drug-induced hyperglycemia -BS should be better now that he is off Prednisone.  Will recheck A1C on f/u visit   Patient was given the opportunity to ask questions.  Patient verbalized understanding of the plan and was able to repeat key elements of the plan.   No orders of the defined types were placed in this encounter.    Requested Prescriptions    No prescriptions requested or ordered in this encounter    No follow-ups on file.  Karle Plumber, MD, FACP

## 2018-03-26 NOTE — Patient Instructions (Signed)
Please apply for the [facility] so that we can refer you to an allergist and lung doctor.

## 2018-03-27 MED FILL — $BREO ELLIPTA 200-25 MCG IN: 200-25 | 90 days supply | Qty: 180 | Fill #5

## 2018-08-16 ENCOUNTER — Other Ambulatory Visit: Payer: Self-pay | Admitting: Internal Medicine

## 2018-08-16 DIAGNOSIS — J4541 Moderate persistent asthma with (acute) exacerbation: Secondary | ICD-10-CM

## 2018-08-17 MED FILL — $BREO ELLIPTA 200-25 MCG IN: 200-25 | 90 days supply | Qty: 180 | Fill #0

## 2018-09-13 ENCOUNTER — Ambulatory Visit: Payer: 59 | Attending: Internal Medicine | Admitting: Internal Medicine

## 2018-09-13 ENCOUNTER — Encounter: Payer: Self-pay | Admitting: Internal Medicine

## 2018-09-13 VITALS — BP 111/72 | HR 76 | Temp 98.3°F | Resp 16 | Wt 160.6 lb

## 2018-09-13 DIAGNOSIS — Z7951 Long term (current) use of inhaled steroids: Secondary | ICD-10-CM | POA: Insufficient documentation

## 2018-09-13 DIAGNOSIS — Z6829 Body mass index (BMI) 29.0-29.9, adult: Secondary | ICD-10-CM | POA: Insufficient documentation

## 2018-09-13 DIAGNOSIS — L309 Dermatitis, unspecified: Secondary | ICD-10-CM | POA: Diagnosis not present

## 2018-09-13 DIAGNOSIS — L2389 Allergic contact dermatitis due to other agents: Secondary | ICD-10-CM | POA: Diagnosis not present

## 2018-09-13 DIAGNOSIS — Z79899 Other long term (current) drug therapy: Secondary | ICD-10-CM | POA: Insufficient documentation

## 2018-09-13 DIAGNOSIS — Z23 Encounter for immunization: Secondary | ICD-10-CM | POA: Diagnosis not present

## 2018-09-13 DIAGNOSIS — J4540 Moderate persistent asthma, uncomplicated: Secondary | ICD-10-CM | POA: Diagnosis not present

## 2018-09-13 DIAGNOSIS — E559 Vitamin D deficiency, unspecified: Secondary | ICD-10-CM | POA: Insufficient documentation

## 2018-09-13 DIAGNOSIS — E669 Obesity, unspecified: Secondary | ICD-10-CM | POA: Insufficient documentation

## 2018-09-13 MED ORDER — FLUTICASONE FUROATE-VILANTEROL 200-25 MCG/INH IN AEPB: 1.0000 | INHALATION_SPRAY | Freq: Every day | RESPIRATORY_TRACT | 6 refills | Status: DC

## 2018-09-13 MED ORDER — TRIAMCINOLONE ACETONIDE 0.1 % EX CREA: 1.0000 "application " | TOPICAL_CREAM | Freq: Two times a day (BID) | CUTANEOUS | 1 refills | Status: DC

## 2018-09-13 MED FILL — TRIAMCINOLONE ACETONIDE 0.1: 0.1 | 30 days supply | Qty: 45 | Fill #0

## 2018-09-13 NOTE — Progress Notes (Signed)
Patient ID: Joseph Fitzgerald, male    DOB: 1971-05-29  MRN: [id number]  CC: Follow-up   Subjective: Joseph Fitzgerald is a 47 y.o. male who presents for chronic ds management His concerns today include:  Pt with mod persistent asthma, obesity, environmental allergies and eczema  Asthma:  Last seen 02/2018.  Reports no flare since last visit. Reports compliance with Breo.  He has not had to use neb-machine.  Uses Albuterol only when he plays volley ball 2 x a wk.  Takes Claritin PRN.  Not taking Singulair.   Complains of rash on the dorsal surface of both hands x2 weeks.  Started after he wore latex gloves at work.  The rash itches a lot and the itching is worse when the hands are wet.  Patient Active Problem List   Diagnosis Date Noted  . Hypokalemia 03/17/2018  . Drug-induced hyperglycemia 03/17/2018  . Asthma, moderate persistent 08/17/2016  . Vitamin D deficiency 09/01/2015  . Family history of diabetes mellitus in brother 08/31/2015  . Eczema 08/31/2015  . Environmental allergies 04/16/2014     Current Outpatient Medications on File Prior to Visit  Medication Sig Dispense Refill  . albuterol (PROVENTIL HFA;VENTOLIN HFA) 108 (90 Base) MCG/ACT inhaler 2 puffs every 4 hours while awake for three days, then take 2 puffs every 4 hours as needed for shortness of breath or wheezing 1 Inhaler 11  . albuterol (PROVENTIL) (2.5 MG/3ML) 0.083% nebulizer solution Take 3 mLs (2.5 mg total) by nebulization every 6 (six) hours as needed for wheezing or shortness of breath. 150 mL 1  . BREO ELLIPTA 200-25 MCG/INH AEPB INHALE 1 PUFF INTO THE LUNGS DAILY 60 each 2  . dextromethorphan-guaiFENesin (MUCINEX DM) 30-600 MG 12hr tablet Take 1 tablet by mouth 2 (two) times daily as needed for cough. 10 tablet 0  . fluticasone (FLONASE) 50 MCG/ACT nasal spray Place 2 sprays into both nostrils daily. 16 g 6  . loratadine (CLARITIN) 10 MG tablet Take 1 tablet (10 mg total) by mouth daily. 30 tablet 11  .  montelukast (SINGULAIR) 10 MG tablet Take 1 tablet (10 mg total) by mouth at bedtime. 30 tablet 11   No current facility-administered medications on file prior to visit.     Allergies  Allergen Reactions  . Penicillins Rash    Social History   Socioeconomic History  . Marital status: Single    Spouse name: Not on file  . Number of children: 3  . Years of education: 58  . Highest education level: Not on file  Occupational History  . Occupation: [employer]  . Financial resource strain: Not on file  . Food insecurity:    Worry: Not on file    Inability: Not on file  . Transportation needs:    Medical: Not on file    Non-medical: Not on file  Tobacco Use  . Smoking status: Never Smoker  . Smokeless tobacco: Current User    Types: Chew  Substance and Sexual Activity  . Alcohol use: Yes    Comment: beer  . Drug use: No  . Sexual activity: Yes  Lifestyle  . Physical activity:    Days per week: 0 days    Minutes per session: 0 min  . Stress: Not at all  Relationships  . Social connections:    Talks on phone: Twice a week    Gets together: Twice a week    Attends religious service: More than 4 times per year  Active member of club or organization: No    Attends meetings of clubs or organizations: Never    Relationship status: Married  . Intimate partner violence:    Fear of current or ex partner: No    Emotionally abused: No    Physically abused: No    Forced sexual activity: No  Other Topics Concern  . Not on file  Social History Narrative  . Not on file    Family History  Problem Relation Age of Onset  . Asthma Brother   . Diabetes Brother     Past Surgical History:  Procedure Laterality Date  . NO PAST SURGERIES      ROS: Review of Systems Negative except as above  PHYSICAL EXAM: BP 111/72   Pulse 76   Temp 98.3 F (36.8 C) (Oral)   Resp 16   Wt 160 lb 9.6 oz (72.8 kg)   SpO2 96%   BMI 29.37 kg/m   Physical Exam  General  appearance - alert, well appearing, and in no distress Mental status - normal mood, behavior, speech, dress, motor activity, and thought processes Eyes - shiners around eyes Neck - supple, no significant adenopathy Chest - clear to auscultation, no wheezes, rales or rhonchi, symmetric air entry Heart - normal rate, regular rhythm, normal S1, S2, no murmurs, rubs, clicks or gallops Extremities - peripheral pulses normal, no pedal edema, no clubbing or cyanosis Skin -atopic appearing rash on dorsum of hands.  Similar appearing rash around neck      ASSESSMENT AND PLAN: 1. Moderate persistent asthma without complication Well controlled. - fluticasone furoate-vilanterol (BREO ELLIPTA) 200-25 MCG/INH AEPB; Inhale 1 puff into the lungs daily.  Dispense: 60 each; Refill: 6  2. Eczema, unspecified type Associated with asthma and allergies - triamcinolone cream (KENALOG) 0.1 %; Apply 1 application topically 2 (two) times daily.  Dispense: 45 g; Refill: 1  3. Allergic contact dermatitis due to other agents Advised to avoid wearing latex gloves. - triamcinolone cream (KENALOG) 0.1 %; Apply 1 application topically 2 (two) times daily.  Dispense: 45 g; Refill: 1  4. Need for immunization against influenza - Flu Vaccine QUAD 36+ mos IM   Patient was given the opportunity to ask questions.  Patient verbalized understanding of the plan and was able to repeat key elements of the plan.   Orders Placed This Encounter  Procedures  . Flu Vaccine QUAD 36+ mos IM     Requested Prescriptions    No prescriptions requested or ordered in this encounter    No follow-ups on file.  Karle Plumber, MD, FACP

## 2018-09-13 NOTE — Patient Instructions (Addendum)
You should avoid using latex gloves.  Use the triamcinolone cream on the rash as prescribed.  Influenza Virus Vaccine injection (Fluarix) What is this medicine? INFLUENZA VIRUS VACCINE (in floo EN zuh VAHY ruhs vak SEEN) helps to reduce the risk of getting influenza also known as the flu. This medicine may be used for other purposes; ask your health care provider or pharmacist if you have questions. COMMON BRAND NAME(S): Fluarix, Fluzone What should I tell my health care provider before I take this medicine? They need to know if you have any of these conditions: -bleeding disorder like hemophilia -fever or infection -Guillain-Barre syndrome or other neurological problems -immune system problems -infection with the human immunodeficiency virus (HIV) or AIDS -low blood platelet counts -multiple sclerosis -an unusual or allergic reaction to influenza virus vaccine, eggs, chicken proteins, latex, gentamicin, other medicines, foods, dyes or preservatives -pregnant or trying to get pregnant -breast-feeding How should I use this medicine? This vaccine is for injection into a muscle. It is given by a health care professional. A copy of Vaccine Information Statements will be given before each vaccination. Read this sheet carefully each time. The sheet may change frequently. Talk to your pediatrician regarding the use of this medicine in children. Special care may be needed. Overdosage: If you think you have taken too much of this medicine contact a poison control center or emergency room at once. NOTE: This medicine is only for you. Do not share this medicine with others. What if I miss a dose? This does not apply. What may interact with this medicine? -chemotherapy or radiation therapy -medicines that lower your immune system like etanercept, anakinra, infliximab, and adalimumab -medicines that treat or prevent blood clots like warfarin -phenytoin -steroid medicines like prednisone or  cortisone -theophylline -vaccines This list may not describe all possible interactions. Give your health care provider a list of all the medicines, herbs, non-prescription drugs, or dietary supplements you use. Also tell them if you smoke, drink alcohol, or use illegal drugs. Some items may interact with your medicine. What should I watch for while using this medicine? Report any side effects that do not go away within 3 days to your doctor or health care professional. Call your health care provider if any unusual symptoms occur within 6 weeks of receiving this vaccine. You may still catch the flu, but the illness is not usually as bad. You cannot get the flu from the vaccine. The vaccine will not protect against colds or other illnesses that may cause fever. The vaccine is needed every year. What side effects may I notice from receiving this medicine? Side effects that you should report to your doctor or health care professional as soon as possible: -allergic reactions like skin rash, itching or hives, swelling of the face, lips, or tongue Side effects that usually do not require medical attention (report to your doctor or health care professional if they continue or are bothersome): -fever -headache -muscle aches and pains -pain, tenderness, redness, or swelling at site where injected -weak or tired This list may not describe all possible side effects. Call your doctor for medical advice about side effects. You may report side effects to FDA at 1-800-FDA-1088. Where should I keep my medicine? This vaccine is only given in a clinic, pharmacy, doctor's office, or other health care setting and will not be stored at home. NOTE: This sheet is a summary. It may not cover all possible information. If you have questions about this medicine, talk to your  doctor, pharmacist, or health care provider.  2018 Elsevier/Gold Standard (2008-06-11 09:30:40)

## 2018-11-30 MED FILL — $BREO ELLIPTA 200-25 MCG IN: 200-25 | 90 days supply | Qty: 180 | Fill #0

## 2019-01-15 ENCOUNTER — Ambulatory Visit: Payer: 59 | Admitting: Internal Medicine

## 2019-01-18 ENCOUNTER — Telehealth: Payer: Self-pay | Admitting: *Deleted

## 2019-01-18 NOTE — Telephone Encounter (Signed)
Patient verified DOB Patients wife states they had transportation concerns. The car was having trouble. Patient was rescheduled for 02/25/2019 with PCP at 1:50pm.

## 2019-01-25 ENCOUNTER — Emergency Department (HOSPITAL_COMMUNITY): Payer: 59

## 2019-01-25 ENCOUNTER — Inpatient Hospital Stay (HOSPITAL_COMMUNITY)
Admission: EM | Admit: 2019-01-25 | Discharge: 2019-01-27 | DRG: 202 | Disposition: A | Discharge status: Home / Self Care | Payer: 59 | Attending: Internal Medicine | Admitting: Internal Medicine

## 2019-01-25 ENCOUNTER — Encounter (HOSPITAL_COMMUNITY): Payer: Self-pay

## 2019-01-25 DIAGNOSIS — Z825 Family history of asthma and other chronic lower respiratory diseases: Secondary | ICD-10-CM

## 2019-01-25 DIAGNOSIS — R05 Cough: Secondary | ICD-10-CM | POA: Diagnosis not present

## 2019-01-25 DIAGNOSIS — D72829 Elevated white blood cell count, unspecified: Secondary | ICD-10-CM | POA: Diagnosis present

## 2019-01-25 DIAGNOSIS — J441 Chronic obstructive pulmonary disease with (acute) exacerbation: Secondary | ICD-10-CM | POA: Diagnosis not present

## 2019-01-25 DIAGNOSIS — Z880 Allergy status to penicillin: Secondary | ICD-10-CM

## 2019-01-25 DIAGNOSIS — J9601 Acute respiratory failure with hypoxia: Secondary | ICD-10-CM | POA: Diagnosis not present

## 2019-01-25 DIAGNOSIS — J45901 Unspecified asthma with (acute) exacerbation: Principal | ICD-10-CM | POA: Diagnosis present

## 2019-01-25 DIAGNOSIS — Z833 Family history of diabetes mellitus: Secondary | ICD-10-CM

## 2019-01-25 DIAGNOSIS — F17220 Nicotine dependence, chewing tobacco, uncomplicated: Secondary | ICD-10-CM | POA: Diagnosis present

## 2019-01-25 DIAGNOSIS — Z7951 Long term (current) use of inhaled steroids: Secondary | ICD-10-CM

## 2019-01-25 DIAGNOSIS — E119 Type 2 diabetes mellitus without complications: Secondary | ICD-10-CM | POA: Diagnosis present

## 2019-01-25 DIAGNOSIS — L309 Dermatitis, unspecified: Secondary | ICD-10-CM | POA: Diagnosis present

## 2019-01-25 DIAGNOSIS — R0602 Shortness of breath: Secondary | ICD-10-CM | POA: Diagnosis not present

## 2019-01-25 DIAGNOSIS — R51 Headache: Secondary | ICD-10-CM | POA: Diagnosis not present

## 2019-01-25 LAB — CBC WITH DIFFERENTIAL/PLATELET
Abs Immature Granulocytes: 0.04 10*3/uL (ref 0.00–0.07)
BASOS ABS: 0.1 10*3/uL (ref 0.0–0.1)
Basophils Relative: 1 %
Eosinophils Absolute: 0.6 10*3/uL — ABNORMAL HIGH (ref 0.0–0.5)
Eosinophils Relative: 5 %
HCT: 48.3 % (ref 39.0–52.0)
Hemoglobin: 16.5 g/dL (ref 13.0–17.0)
Immature Granulocytes: 0 %
Lymphocytes Relative: 5 %
Lymphs Abs: 0.7 10*3/uL (ref 0.7–4.0)
MCH: 31.2 pg (ref 26.0–34.0)
MCHC: 34.2 g/dL (ref 30.0–36.0)
MCV: 91.3 fL (ref 80.0–100.0)
MONO ABS: 0.6 10*3/uL (ref 0.1–1.0)
Monocytes Relative: 4 %
Neutro Abs: 11.3 10*3/uL — ABNORMAL HIGH (ref 1.7–7.7)
Neutrophils Relative %: 85 %
Platelets: 252 10*3/uL (ref 150–400)
RBC: 5.29 MIL/uL (ref 4.22–5.81)
RDW: 12 % (ref 11.5–15.5)
WBC: 13.3 10*3/uL — AB (ref 4.0–10.5)
nRBC: 0 % (ref 0.0–0.2)

## 2019-01-25 LAB — BASIC METABOLIC PANEL
Anion gap: 8 (ref 5–15)
BUN: 11 mg/dL (ref 6–20)
CO2: 26 mmol/L (ref 22–32)
Calcium: 9.1 mg/dL (ref 8.9–10.3)
Chloride: 106 mmol/L (ref 98–111)
Creatinine, Ser: 0.93 mg/dL (ref 0.61–1.24)
GFR calc Af Amer: >60 mL/min (ref >60)
GFR calc non Af Amer: >60 mL/min (ref >60)
Glucose, Bld: 130 mg/dL — ABNORMAL HIGH (ref 70–99)
Potassium: 3.5 mmol/L (ref 3.5–5.1)
Sodium: 140 mmol/L (ref 135–145)

## 2019-01-25 LAB — CBG MONITORING, ED: GLUCOSE-CAPILLARY: 123 mg/dL — AB (ref 70–99)

## 2019-01-25 LAB — INFLUENZA PANEL BY PCR (TYPE A & B)
INFLAPCR: NEGATIVE
Influenza B By PCR: NEGATIVE

## 2019-01-25 LAB — LACTIC ACID, PLASMA: Lactic Acid, Venous: 1.6 mmol/L (ref 0.5–1.9)

## 2019-01-25 MED ORDER — ENOXAPARIN SODIUM 40 MG/0.4ML ~~LOC~~ SOLN
40.0000 mg | SUBCUTANEOUS | Status: DC
  Administered 2019-01-26: 40 mg via SUBCUTANEOUS
  Filled 2019-01-25 (×2): qty 0.4

## 2019-01-25 MED ORDER — DM-GUAIFENESIN ER 30-600 MG PO TB12
1.0000 | ORAL_TABLET | Freq: Two times a day (BID) | ORAL | Status: DC | PRN
  Filled 2019-01-25: qty 1

## 2019-01-25 MED ORDER — SODIUM CHLORIDE 0.9% FLUSH: 3.0000 mL | INTRAVENOUS | Status: DC | PRN

## 2019-01-25 MED ORDER — FLUTICASONE FUROATE-VILANTEROL 200-25 MCG/INH IN AEPB
1.0000 | INHALATION_SPRAY | Freq: Every day | RESPIRATORY_TRACT | Status: DC
  Filled 2019-01-25: qty 28

## 2019-01-25 MED ORDER — SODIUM CHLORIDE 0.9% FLUSH
3.0000 mL | Freq: Two times a day (BID) | INTRAVENOUS | Status: DC
  Administered 2019-01-26: 3 mL via INTRAVENOUS

## 2019-01-25 MED ORDER — METHYLPREDNISOLONE SODIUM SUCC 125 MG IJ SOLR
60.0000 mg | Freq: Four times a day (QID) | INTRAMUSCULAR | Status: DC
  Administered 2019-01-26 – 2019-01-27 (×5): 60 mg via INTRAVENOUS
  Filled 2019-01-25 (×5): qty 2

## 2019-01-25 MED ORDER — MAGNESIUM SULFATE 2 GM/50ML IV SOLN
2.0000 g | Freq: Once | INTRAVENOUS | Status: AC
  Administered 2019-01-25: 2 g via INTRAVENOUS
  Filled 2019-01-25: qty 50

## 2019-01-25 MED ORDER — POLYETHYLENE GLYCOL 3350 17 G PO PACK: 17.0000 g | PACK | Freq: Every day | ORAL | Status: DC | PRN

## 2019-01-25 MED ORDER — ONDANSETRON HCL 4 MG/2ML IJ SOLN: 4.0000 mg | Freq: Four times a day (QID) | INTRAMUSCULAR | Status: DC | PRN

## 2019-01-25 MED ORDER — ALBUTEROL (5 MG/ML) CONTINUOUS INHALATION SOLN
10.0000 mg/h | INHALATION_SOLUTION | Freq: Once | RESPIRATORY_TRACT | Status: AC
  Administered 2019-01-25: 10 mg/h via RESPIRATORY_TRACT

## 2019-01-25 MED ORDER — POTASSIUM CHLORIDE IN NACL 20-0.9 MEQ/L-% IV SOLN
INTRAVENOUS | Status: DC
  Administered 2019-01-25: 23:00:00 via INTRAVENOUS
  Filled 2019-01-25 (×2): qty 1000

## 2019-01-25 MED ORDER — ONDANSETRON HCL 4 MG PO TABS: 4.0000 mg | ORAL_TABLET | Freq: Four times a day (QID) | ORAL | Status: DC | PRN

## 2019-01-25 MED ORDER — ALBUTEROL SULFATE (2.5 MG/3ML) 0.083% IN NEBU: 2.5000 mg | INHALATION_SOLUTION | RESPIRATORY_TRACT | Status: DC | PRN

## 2019-01-25 MED ORDER — ACETAMINOPHEN 650 MG RE SUPP: 650.0000 mg | Freq: Four times a day (QID) | RECTAL | Status: DC | PRN

## 2019-01-25 MED ORDER — SODIUM CHLORIDE 0.9 % IV SOLN: 250.0000 mL | INTRAVENOUS | Status: DC | PRN

## 2019-01-25 MED ORDER — HYDROCODONE-ACETAMINOPHEN 5-325 MG PO TABS: 1.0000 | ORAL_TABLET | ORAL | Status: DC | PRN

## 2019-01-25 MED ORDER — METHYLPREDNISOLONE SODIUM SUCC 125 MG IJ SOLR
125.0000 mg | Freq: Once | INTRAMUSCULAR | Status: AC
  Administered 2019-01-25: 125 mg via INTRAVENOUS
  Filled 2019-01-25: qty 2

## 2019-01-25 MED ORDER — ACETAMINOPHEN 325 MG PO TABS: 650.0000 mg | ORAL_TABLET | Freq: Four times a day (QID) | ORAL | Status: DC | PRN

## 2019-01-25 NOTE — ED Provider Notes (Signed)
[facility] EMERGENCY DEPARTMENT Provider Note   CSN: [id number] Arrival date & time: 01/25/19  1938    History   Chief Complaint Chief Complaint  Patient presents with  . Shortness of Breath    HPI Joseph Fitzgerald is a 48 y.o. male.  He has a history of asthma and diabetes.  He has had increased shortness of breath all day today with cough and flulike symptoms, fever and myalgias.  Cough is been nonproductive.  He has been using his MDI and nebulizers without any improvement.  His room air sats were 84% on arrival.  He does not smoke and did not get a flu shot this year.     The history is provided by the patient.  Shortness of Breath  Severity:  Severe Onset quality:  Gradual Timing:  Constant Progression:  Unchanged Chronicity:  New Context: URI   Relieved by:  Nothing Worsened by:  Activity Ineffective treatments:  Inhaler Associated symptoms: cough, diaphoresis, fever, headaches and wheezing   Associated symptoms: no abdominal pain, no chest pain, no hemoptysis, no rash, no sore throat, no sputum production, no syncope and no vomiting     Past Medical History:  Diagnosis Date  . Asthma   . Diabetes mellitus without complication ([place])   . Shortness of breath dyspnea     Patient Active Problem List   Diagnosis Date Noted  . Hypokalemia 03/17/2018  . Drug-induced hyperglycemia 03/17/2018  . Asthma, moderate persistent 08/17/2016  . Vitamin D deficiency 09/01/2015  . Family history of diabetes mellitus in brother 08/31/2015  . Eczema 08/31/2015  . Environmental allergies 04/16/2014    Past Surgical History:  Procedure Laterality Date  . NO PAST SURGERIES          Home Medications    Prior to Admission medications   Medication Sig Start Date End Date Taking? Authorizing Provider  albuterol (PROVENTIL HFA;VENTOLIN HFA) 108 (90 Base) MCG/ACT inhaler 2 puffs every 4 hours while awake for three days, then take 2 puffs every 4 hours as needed  for shortness of breath or wheezing 03/26/18   Ladell Pier, MD  albuterol (PROVENTIL) (2.5 MG/3ML) 0.083% nebulizer solution Take 3 mLs (2.5 mg total) by nebulization every 6 (six) hours as needed for wheezing or shortness of breath. 03/26/18   Ladell Pier, MD  dextromethorphan-guaiFENesin (MUCINEX DM) 30-600 MG 12hr tablet Take 1 tablet by mouth 2 (two) times daily as needed for cough. 03/18/18   Mariel Aloe, MD  fluticasone (FLONASE) 50 MCG/ACT nasal spray Place 2 sprays into both nostrils daily. 01/04/18   Ladell Pier, MD  fluticasone furoate-vilanterol (BREO ELLIPTA) 200-25 MCG/INH AEPB Inhale 1 puff into the lungs daily. 09/13/18   Ladell Pier, MD  loratadine (CLARITIN) 10 MG tablet Take 1 tablet (10 mg total) by mouth daily. 03/26/18   Ladell Pier, MD  triamcinolone cream (KENALOG) 0.1 % Apply 1 application topically 2 (two) times daily. 09/13/18   Ladell Pier, MD    Family History Family History  Problem Relation Age of Onset  . Asthma Brother   . Diabetes Brother     Social History Social History   Tobacco Use  . Smoking status: Never Smoker  . Smokeless tobacco: Current User    Types: Chew  Substance Use Topics  . Alcohol use: Yes    Comment: beer  . Drug use: No     Allergies   Penicillins   Review of Systems Review of  Systems  Constitutional: Positive for diaphoresis and fever.  HENT: Negative for sore throat.   Eyes: Negative for visual disturbance.  Respiratory: Positive for cough, shortness of breath and wheezing. Negative for hemoptysis and sputum production.   Cardiovascular: Negative for chest pain and syncope.  Gastrointestinal: Negative for abdominal pain and vomiting.  Genitourinary: Negative for dysuria.  Musculoskeletal: Positive for myalgias.  Skin: Negative for rash.  Neurological: Positive for headaches.     Physical Exam Updated Vital Signs BP 122/88 (BP Location: Right Arm)   Pulse (!) 114   Temp  99.5 F (37.5 C) (Oral)   Resp (!) 21   SpO2 (!) 88%   Physical Exam Vitals signs and nursing note reviewed.  Constitutional:      Appearance: He is well-developed. He is diaphoretic.  HENT:     Head: Normocephalic and atraumatic.  Eyes:     Conjunctiva/sclera: Conjunctivae normal.     Pupils: Pupils are equal, round, and reactive to light.  Neck:     Musculoskeletal: Neck supple.  Cardiovascular:     Rate and Rhythm: Regular rhythm. Tachycardia present.     Heart sounds: No murmur.  Pulmonary:     Effort: Tachypnea and accessory muscle usage present. No respiratory distress.     Breath sounds: Wheezing present.  Abdominal:     Palpations: Abdomen is soft.     Tenderness: There is no abdominal tenderness.  Musculoskeletal: Normal range of motion.     Right lower leg: He exhibits no tenderness. No edema.     Left lower leg: He exhibits no tenderness. No edema.  Skin:    General: Skin is warm.     Capillary Refill: Capillary refill takes less than 2 seconds.  Neurological:     General: No focal deficit present.     Mental Status: He is alert and oriented to person, place, and time.      ED Treatments / Results  Labs (all labs ordered are listed, but only abnormal results are displayed) Labs Reviewed  BASIC METABOLIC PANEL - Abnormal; Notable for the following components:      Result Value   Glucose, Bld 130 (*)    All other components within normal limits  CBC WITH DIFFERENTIAL/PLATELET - Abnormal; Notable for the following components:   WBC 13.3 (*)    Neutro Abs 11.3 (*)    Eosinophils Absolute 0.6 (*)    All other components within normal limits  CBG MONITORING, ED - Abnormal; Notable for the following components:   Glucose-Capillary 123 (*)    All other components within normal limits  CULTURE, BLOOD (ROUTINE X 2)  CULTURE, BLOOD (ROUTINE X 2)  EXPECTORATED SPUTUM ASSESSMENT W REFEX TO RESP CULTURE  LACTIC ACID, PLASMA  INFLUENZA PANEL BY PCR (TYPE A & B)    BASIC METABOLIC PANEL  CBC WITH DIFFERENTIAL/PLATELET    EKG EKG Interpretation  Date/Time:  Friday January 25 2019 19:46:25 EST Ventricular Rate:  113 PR Interval:  172 QRS Duration: 82 QT Interval:  314 QTC Calculation: 430 R Axis:   84 Text Interpretation:  Sinus tachycardia Otherwise normal ECG similar to prior 4/19 Confirmed by Aletta Edouard [phone]) on 01/25/2019 8:12:40 PM   Radiology Dg Chest Port 1 View  Result Date: 01/25/2019 CLINICAL DATA:  Shortness of breath and cough tonight. History of asthma. EXAM: PORTABLE CHEST 1 VIEW COMPARISON:  03/16/2018 FINDINGS: The heart size and mediastinal contours are within normal limits. Both lungs are clear. The visualized skeletal structures are  unremarkable. IMPRESSION: No active disease. Electronically Signed   By: Lucienne Capers M.D.   On: 01/25/2019 20:35    Procedures .Critical Care Performed by: Hayden Rasmussen, MD Authorized by: Hayden Rasmussen, MD   Critical care provider statement:    Critical care time (minutes):  45   Critical care time was exclusive of:  Separately billable procedures and treating other patients   Critical care was necessary to treat or prevent imminent or life-threatening deterioration of the following conditions:  Respiratory failure   Critical care was time spent personally by me on the following activities:  Discussions with consultants, evaluation of patient's response to treatment, examination of patient, ordering and performing treatments and interventions, ordering and review of laboratory studies, ordering and review of radiographic studies, pulse oximetry, re-evaluation of patient's condition, obtaining history from patient or surrogate, review of old charts, development of treatment plan with patient or surrogate and ventilator management   I assumed direction of critical care for this patient from another provider in my specialty: no     (including critical care time)  Medications  Ordered in ED Medications  albuterol (PROVENTIL,VENTOLIN) solution continuous neb (has no administration in time range)  magnesium sulfate IVPB 2 g 50 mL (has no administration in time range)  methylPREDNISolone sodium succinate (SOLU-MEDROL) 125 mg/2 mL injection 125 mg (has no administration in time range)     Initial Impression / Assessment and Plan / ED Course  I have reviewed the triage vital signs and the nursing notes.  Pertinent labs & imaging results that were available during my care of the patient were reviewed by me and considered in my medical decision making (see chart for details).  Clinical Course as of Jan 25 2321  Fri Jan 25, 2019  2056 Patient is wheezing and hypoxic.  Chest x-ray normal.  He is getting magnesium and continuous albuterol and is also received Solu-Medrol.   [MB]  2138 Reevaluated patient still tachypneic.  Tachycardia mildly improved.  Air entry seems better but is still requiring oxygen, desatted to the mid 80s on nasal cannula.  Asked respiratory to cycle, as for possible BiPAP candidate.   [MB]  2154 Patient looking more comfortable on bipap   [MB]  2234 Discussed with Dr. opened from the hospitalist service who will evaluate for admission.   [MB]    Clinical Course User Index [MB] Hayden Rasmussen, MD        Final Clinical Impressions(s) / ED Diagnoses   Final diagnoses:  COPD exacerbation [facility])    ED Discharge Orders    None       Hayden Rasmussen, MD 01/25/19 [phone]

## 2019-01-25 NOTE — H&P (Signed)
History and Physical    Joseph Fitzgerald OIZ:[id number] DOB: Dec 24, 1970 DOA: 01/25/2019  PCP: Ladell Pier, MD   Patient coming from: Home   Chief Complaint: SOB, wheezing, fever/chills   HPI: Joseph Fitzgerald is a 48 y.o. male with medical history significant for eczema and persistent asthma, now presenting to the emergency department for evaluation of shortness of breath, wheezing, fevers, chills, mild rhinorrhea, and cough productive of thick clear sputum.  Patient reports that he was in his usual state until early today when he developed subjective fevers and chills, mild rhinorrhea, cough productive of thick clear sputum, and shortness of breath with wheezing.  He was not able to get much relief from his inhalers at home, continued to worsen throughout the course of the day, and eventually comes in for evaluation.  He denies any chest pain or palpitations.  He denies any lower extremity swelling or tenderness.  No known sick contacts.  He has never required intubation for his asthma.  ED Course: Upon arrival to the ED, patient is found to be afebrile, saturating 84% on room air, tachypneic, tachycardic, and with stable blood pressure.  EKG features sinus tachycardia with rate 113 and chest x-ray is negative for acute cardiopulmonary disease.  Chemistry panel is unremarkable.  CBC notable for leukocytosis to 13,300.  Lactic acid is reassuringly normal.  Influenza PCR is negative.  Blood cultures were collected and the patient was placed on BiPAP.  He was treated with continuous albuterol neb, 2 g IV magnesium, and 125 mg IV Solu-Medrol.  He has improved some, continues to require supplemental oxygen, will ongoing treatments and frequent assesment, and will be observed for further evaluation and management.  Review of Systems:  All other systems reviewed and apart from HPI, are negative.  Past Medical History:  Diagnosis Date  . Asthma   . Diabetes mellitus without complication ([place])   .  Shortness of breath dyspnea     Past Surgical History:  Procedure Laterality Date  . NO PAST SURGERIES       reports that he has never smoked. His smokeless tobacco use includes chew. He reports current alcohol use. He reports that he does not use drugs.  Allergies  Allergen Reactions  . Penicillins Rash    Family History  Problem Relation Age of Onset  . Asthma Brother   . Diabetes Brother      Prior to Admission medications   Medication Sig Start Date End Date Taking? Authorizing Provider  fluticasone furoate-vilanterol (BREO ELLIPTA) 200-25 MCG/INH AEPB Inhale 1 puff into the lungs daily. 09/13/18  Yes Ladell Pier, MD  albuterol (PROVENTIL HFA;VENTOLIN HFA) 108 (90 Base) MCG/ACT inhaler 2 puffs every 4 hours while awake for three days, then take 2 puffs every 4 hours as needed for shortness of breath or wheezing Patient not taking: Reported on 01/25/2019 03/26/18   Ladell Pier, MD  albuterol (PROVENTIL) (2.5 MG/3ML) 0.083% nebulizer solution Take 3 mLs (2.5 mg total) by nebulization every 6 (six) hours as needed for wheezing or shortness of breath. Patient not taking: Reported on 01/25/2019 03/26/18   Ladell Pier, MD  dextromethorphan-guaiFENesin [facility] DM) 30-600 MG 12hr tablet Take 1 tablet by mouth 2 (two) times daily as needed for cough. Patient not taking: Reported on 01/25/2019 03/18/18   Mariel Aloe, MD  fluticasone [facility]) 50 MCG/ACT nasal spray Place 2 sprays into both nostrils daily. Patient not taking: Reported on 01/25/2019 01/04/18   Ladell Pier, MD  loratadine (  CLARITIN) 10 MG tablet Take 1 tablet (10 mg total) by mouth daily. Patient not taking: Reported on 01/25/2019 03/26/18   Ladell Pier, MD  triamcinolone cream (KENALOG) 0.1 % Apply 1 application topically 2 (two) times daily. Patient not taking: Reported on 01/25/2019 09/13/18   Ladell Pier, MD    Physical Exam: Vitals:   01/25/19 2142 01/25/19 2145 01/25/19 2200  01/25/19 2215  BP:  118/86 129/86 109/77  Pulse:  (!) 115 (!) 118 (!) 118  Resp:  19 20 19   Temp:      TempSrc:      SpO2: 98% 99% 99% 96%    Constitutional: Mild tachypnea, not in acute distress, no diaphoresis  Eyes: PERTLA, lids and conjunctivae normal ENMT: Mucous membranes are moist. Posterior pharynx clear of any exudate or lesions.   Neck: normal, supple, no masses, no thyromegaly Respiratory: Diminished bilaterally with prolonged expiratory phase. Mild tachypnea. No pallor or cyanosis.   Cardiovascular: Rate ~110 and regular. No extremity edema.   Abdomen: No distension, no tenderness, soft. Bowel sounds normal.  Musculoskeletal: no clubbing / cyanosis. No joint deformity upper and lower extremities.   Skin: Eczematous dermatitis involving dorsal hands and wrists b/l. Warm, dry, well-perfused. Neurologic: CN 2-12 grossly intact. Sensation intact. Strength 5/5 in all 4 limbs.  Psychiatric: Alert and oriented x 3. Calm, cooperative.    Labs on Admission: I have personally reviewed following labs and imaging studies  CBC: Recent Labs  Lab 01/25/19 2013  WBC 13.3*  NEUTROABS 11.3*  HGB 16.5  HCT 48.3  MCV 91.3  PLT 536   Basic Metabolic Panel: Recent Labs  Lab 01/25/19 2013  NA 140  K 3.5  CL 106  CO2 26  GLUCOSE 130*  BUN 11  CREATININE 0.93  CALCIUM 9.1   GFR: CrCl cannot be calculated (Unknown ideal weight.). Liver Function Tests: No results for input(s): AST, ALT, ALKPHOS, BILITOT, PROT, ALBUMIN in the last 168 hours. No results for input(s): LIPASE, AMYLASE in the last 168 hours. No results for input(s): AMMONIA in the last 168 hours. Coagulation Profile: No results for input(s): INR, PROTIME in the last 168 hours. Cardiac Enzymes: No results for input(s): CKTOTAL, CKMB, CKMBINDEX, TROPONINI in the last 168 hours. BNP (last 3 results) No results for input(s): PROBNP in the last 8760 hours. HbA1C: No results for input(s): HGBA1C in the last 72  hours. CBG: Recent Labs  Lab 01/25/19 2002  GLUCAP 123*   Lipid Profile: No results for input(s): CHOL, HDL, LDLCALC, TRIG, CHOLHDL, LDLDIRECT in the last 72 hours. Thyroid Function Tests: No results for input(s): TSH, T4TOTAL, FREET4, T3FREE, THYROIDAB in the last 72 hours. Anemia Panel: No results for input(s): VITAMINB12, FOLATE, FERRITIN, TIBC, IRON, RETICCTPCT in the last 72 hours. Urine analysis: No results found for: COLORURINE, APPEARANCEUR, LABSPEC, PHURINE, GLUCOSEU, HGBUR, BILIRUBINUR, KETONESUR, PROTEINUR, UROBILINOGEN, NITRITE, LEUKOCYTESUR Sepsis Labs: @LABRCNTIP (procalcitonin:4,lacticidven:4) )No results found for this or any previous visit (from the past 240 hour(s)).   Radiological Exams on Admission: Dg Chest Port 1 View  Result Date: 01/25/2019 CLINICAL DATA:  Shortness of breath and cough tonight. History of asthma. EXAM: PORTABLE CHEST 1 VIEW COMPARISON:  03/16/2018 FINDINGS: The heart size and mediastinal contours are within normal limits. Both lungs are clear. The visualized skeletal structures are unremarkable. IMPRESSION: No active disease. Electronically Signed   By: Lucienne Capers M.D.   On: 01/25/2019 20:35    EKG: Independently reviewed. Sinus tachycardia (rate 113).   Assessment/Plan  1. Asthma  with acute exacerbation; acute hypoxic respiratory failure  - Presents with one day of fever, rhinorrhea, cough, and wheeze  - He is afebrile here with leukocytosis, normal lactate, negative influenza PCR, and clear CXR  - Saturatimg mid-80's on rm air while at rest  - No evidence for DVT/PE  - Improving in ED with BiPAP, continuous albuterol treatment, 2 g IV mag, and 125 mg IV Solu-Medrol  - Asthma exacerbation likely precipitated by viral infection  - Continue systemic steroid, Breo, albuterol, and transition off of BiPAP as he continues to improve    DVT prophylaxis: Lovenox  Code Status: Full  Family Communication: Discussed with patient  Consults  called: None Admission status: Observation     Vianne Bulls, MD [facility] Pager [phone]  If 7PM-7AM, please contact night-coverage [email or web address] Password [facility]  01/25/2019, 10:38 PM

## 2019-01-25 NOTE — ED Notes (Signed)
Pt completed breathing treatment, stat dropped to 86% on RA, audible wheezing, placed on NRB

## 2019-01-25 NOTE — ED Triage Notes (Signed)
Pt states that he has been feeling SOB all day, flu like symptoms, fever, asthma unrelieved by MDI, stat 84% on RA, placed on NRB

## 2019-01-26 ENCOUNTER — Other Ambulatory Visit: Payer: Self-pay

## 2019-01-26 DIAGNOSIS — E119 Type 2 diabetes mellitus without complications: Secondary | ICD-10-CM | POA: Diagnosis present

## 2019-01-26 DIAGNOSIS — R0602 Shortness of breath: Secondary | ICD-10-CM | POA: Diagnosis present

## 2019-01-26 DIAGNOSIS — Z825 Family history of asthma and other chronic lower respiratory diseases: Secondary | ICD-10-CM | POA: Diagnosis not present

## 2019-01-26 DIAGNOSIS — J9601 Acute respiratory failure with hypoxia: Secondary | ICD-10-CM | POA: Diagnosis not present

## 2019-01-26 DIAGNOSIS — Z7951 Long term (current) use of inhaled steroids: Secondary | ICD-10-CM | POA: Diagnosis not present

## 2019-01-26 DIAGNOSIS — Z880 Allergy status to penicillin: Secondary | ICD-10-CM | POA: Diagnosis not present

## 2019-01-26 DIAGNOSIS — F17220 Nicotine dependence, chewing tobacco, uncomplicated: Secondary | ICD-10-CM | POA: Diagnosis present

## 2019-01-26 DIAGNOSIS — J45901 Unspecified asthma with (acute) exacerbation: Secondary | ICD-10-CM | POA: Diagnosis not present

## 2019-01-26 DIAGNOSIS — D72829 Elevated white blood cell count, unspecified: Secondary | ICD-10-CM | POA: Diagnosis present

## 2019-01-26 DIAGNOSIS — Z833 Family history of diabetes mellitus: Secondary | ICD-10-CM | POA: Diagnosis not present

## 2019-01-26 DIAGNOSIS — L309 Dermatitis, unspecified: Secondary | ICD-10-CM | POA: Diagnosis present

## 2019-01-26 DIAGNOSIS — J441 Chronic obstructive pulmonary disease with (acute) exacerbation: Secondary | ICD-10-CM | POA: Diagnosis not present

## 2019-01-26 LAB — CBC WITH DIFFERENTIAL/PLATELET
Abs Immature Granulocytes: 0.03 10*3/uL (ref 0.00–0.07)
BASOS ABS: 0 10*3/uL (ref 0.0–0.1)
Basophils Relative: 0 %
Eosinophils Absolute: 0 10*3/uL (ref 0.0–0.5)
Eosinophils Relative: 0 %
HCT: 43.7 % (ref 39.0–52.0)
Hemoglobin: 14.7 g/dL (ref 13.0–17.0)
Immature Granulocytes: 0 %
Lymphocytes Relative: 3 %
Lymphs Abs: 0.3 10*3/uL — ABNORMAL LOW (ref 0.7–4.0)
MCH: 30.8 pg (ref 26.0–34.0)
MCHC: 33.6 g/dL (ref 30.0–36.0)
MCV: 91.4 fL (ref 80.0–100.0)
MONO ABS: 0.1 10*3/uL (ref 0.1–1.0)
Monocytes Relative: 1 %
Neutro Abs: 12 10*3/uL — ABNORMAL HIGH (ref 1.7–7.7)
Neutrophils Relative %: 96 %
Platelets: 227 10*3/uL (ref 150–400)
RBC: 4.78 MIL/uL (ref 4.22–5.81)
RDW: 12 % (ref 11.5–15.5)
WBC: 12.4 10*3/uL — ABNORMAL HIGH (ref 4.0–10.5)
nRBC: 0 % (ref 0.0–0.2)

## 2019-01-26 LAB — BASIC METABOLIC PANEL
Anion gap: 8 (ref 5–15)
BUN: 10 mg/dL (ref 6–20)
CO2: 25 mmol/L (ref 22–32)
Calcium: 8.6 mg/dL — ABNORMAL LOW (ref 8.9–10.3)
Chloride: 105 mmol/L (ref 98–111)
Creatinine, Ser: 0.87 mg/dL (ref 0.61–1.24)
GFR calc Af Amer: >60 mL/min (ref >60)
GFR calc non Af Amer: >60 mL/min (ref >60)
Glucose, Bld: 162 mg/dL — ABNORMAL HIGH (ref 70–99)
Potassium: 4 mmol/L (ref 3.5–5.1)
Sodium: 138 mmol/L (ref 135–145)

## 2019-01-26 MED ORDER — ORAL CARE MOUTH RINSE: 15.0000 mL | Freq: Two times a day (BID) | OROMUCOSAL | Status: DC

## 2019-01-26 MED ORDER — INFLUENZA VAC SPLIT QUAD 0.5 ML IM SUSY: 0.5000 mL | PREFILLED_SYRINGE | INTRAMUSCULAR | Status: DC

## 2019-01-26 MED ORDER — CHLORHEXIDINE GLUCONATE 0.12 % MT SOLN
15.0000 mL | Freq: Two times a day (BID) | OROMUCOSAL | Status: DC
  Administered 2019-01-26 – 2019-01-27 (×3): 15 mL via OROMUCOSAL
  Filled 2019-01-26 (×2): qty 15

## 2019-01-26 NOTE — Progress Notes (Signed)
RT Note: Pt on RA at this time tolerating well. No distress noted. RT will continue to monitor

## 2019-01-26 NOTE — Progress Notes (Signed)
RT NOTES: Removed from bipap and placed on 4lpm nasal cannula. Sats 99%. RN aware. Will continue to monitor.

## 2019-01-26 NOTE — Progress Notes (Signed)
PROGRESS NOTE    Joseph Fitzgerald  KWI:[id number] DOB: [birth year] DOA: 01/25/2019 PCP: Ladell Pier, MD     Brief Narrative:  Joseph Fitzgerald is a 48 y.o. male with medical history significant for eczema and persistent asthma, now presenting to the emergency department for evaluation of shortness of breath, wheezing, fevers, chills, mild rhinorrhea, and cough productive of thick clear sputum.  Patient reports that he was in his usual state until early today when he developed subjective fevers and chills, mild rhinorrhea, cough productive of thick clear sputum, and shortness of breath with wheezing.  He was not able to get much relief from his inhalers at home, continued to worsen throughout the course of the day, and eventually comes in for evaluation. He denies any recent international travel, sick contacts, smoking, or e-cig/vape use. He was admitted for asthma exacerbation.  New events last 24 hours / Subjective: Currently on BiPAP this morning. Complains of cough, no worsening SOB or CP.   Assessment & Plan:   Principal Problem:   Acute asthma exacerbation Active Problems:   Acute respiratory failure with hypoxia (HCC)  Asthma exacerbation Influenza PCR negative Chest x-ray without active disease Continue treatment with Solu-Medrol, nebs, inhaler  Acute hypoxemic respiratory failure Continues to be on BiPAP, wean as able  Leukocytosis Reactive, monitor   DVT prophylaxis: Lovenox Code Status: Full code Family Communication: No family at bedside Disposition Plan: Further improvement of respiratory status, remains on BiPAP this morning   Consultants:   None  Procedures:   None  Antimicrobials:  Anti-infectives (From admission, onward)   None        Objective: Vitals:   01/26/19 0357 01/26/19 0400 01/26/19 0738 01/26/19 0804  BP: 98/66   115/80  Pulse: 71 92 62 87  Resp: (!) 21 (!) 25 18 18   Temp: (!) 97.5 F (36.4 C)     TempSrc: Oral     SpO2: 97% 96%  97% 99%  Weight:      Height:        Intake/Output Summary (Last 24 hours) at 01/26/2019 1041 Last data filed at 01/26/2019 0800 Gross per 24 hour  Intake 420 ml  Output 400 ml  Net 20 ml   Filed Weights   01/26/19 0100  Weight: 68.3 kg    Examination:  General exam: Appears calm and comfortable  Respiratory system: Wheezes bilaterally, remains on BiPAP Cardiovascular system: S1 & S2 heard, RRR. No JVD, murmurs, rubs, gallops or clicks. No pedal edema. Gastrointestinal system: Abdomen is nondistended, soft and nontender. No organomegaly or masses felt. Normal bowel sounds heard. Central nervous system: Alert and oriented. No focal neurological deficits. Extremities: Symmetric 5 x 5 power. Skin: No rashes, lesions or ulcers Psychiatry: Judgement and insight appear normal. Mood & affect appropriate.   Data Reviewed: I have personally reviewed following labs and imaging studies  CBC: Recent Labs  Lab 01/25/19 2013 01/26/19 0256  WBC 13.3* 12.4*  NEUTROABS 11.3* 12.0*  HGB 16.5 14.7  HCT 48.3 43.7  MCV 91.3 91.4  PLT 252 242   Basic Metabolic Panel: Recent Labs  Lab 01/25/19 2013 01/26/19 0256  NA 140 138  K 3.5 4.0  CL 106 105  CO2 26 25  GLUCOSE 130* 162*  BUN 11 10  CREATININE 0.93 0.87  CALCIUM 9.1 8.6*   GFR: Estimated Creatinine Clearance: 89 mL/min (by C-G formula based on SCr of 0.87 mg/dL). Liver Function Tests: No results for input(s): AST, ALT, ALKPHOS, BILITOT, PROT, ALBUMIN in  the last 168 hours. No results for input(s): LIPASE, AMYLASE in the last 168 hours. No results for input(s): AMMONIA in the last 168 hours. Coagulation Profile: No results for input(s): INR, PROTIME in the last 168 hours. Cardiac Enzymes: No results for input(s): CKTOTAL, CKMB, CKMBINDEX, TROPONINI in the last 168 hours. BNP (last 3 results) No results for input(s): PROBNP in the last 8760 hours. HbA1C: No results for input(s): HGBA1C in the last 72 hours. CBG: Recent  Labs  Lab 01/25/19 2002  GLUCAP 123*   Lipid Profile: No results for input(s): CHOL, HDL, LDLCALC, TRIG, CHOLHDL, LDLDIRECT in the last 72 hours. Thyroid Function Tests: No results for input(s): TSH, T4TOTAL, FREET4, T3FREE, THYROIDAB in the last 72 hours. Anemia Panel: No results for input(s): VITAMINB12, FOLATE, FERRITIN, TIBC, IRON, RETICCTPCT in the last 72 hours. Sepsis Labs: Recent Labs  Lab 01/25/19 2010  LATICACIDVEN 1.6    No results found for this or any previous visit (from the past 240 hour(s)).     Radiology Studies: Dg Chest Port 1 View  Result Date: 01/25/2019 CLINICAL DATA:  Shortness of breath and cough tonight. History of asthma. EXAM: PORTABLE CHEST 1 VIEW COMPARISON:  03/16/2018 FINDINGS: The heart size and mediastinal contours are within normal limits. Both lungs are clear. The visualized skeletal structures are unremarkable. IMPRESSION: No active disease. Electronically Signed   By: Lucienne Capers M.D.   On: 01/25/2019 20:35      Scheduled Meds: . chlorhexidine  15 mL Mouth Rinse BID  . enoxaparin (LOVENOX) injection  40 mg Subcutaneous Q24H  . fluticasone furoate-vilanterol  1 puff Inhalation Daily  . [START ON 01/27/2019] Influenza vac split quadrivalent PF  0.5 mL Intramuscular Tomorrow-1000  . mouth rinse  15 mL Mouth Rinse q12n4p  . methylPREDNISolone (SOLU-MEDROL) injection  60 mg Intravenous Q6H  . sodium chloride flush  3 mL Intravenous Q12H  . sodium chloride flush  3 mL Intravenous Q12H   Continuous Infusions: . sodium chloride       LOS: 0 days    Time spent: 40 minutes   Dessa Phi, DO [facility] [email or web address] 01/26/2019, 10:41 AM

## 2019-01-27 LAB — CBC
HCT: 43.9 % (ref 39.0–52.0)
Hemoglobin: 15.1 g/dL (ref 13.0–17.0)
MCH: 31.3 pg (ref 26.0–34.0)
MCHC: 34.4 g/dL (ref 30.0–36.0)
MCV: 90.9 fL (ref 80.0–100.0)
Platelets: 237 10*3/uL (ref 150–400)
RBC: 4.83 MIL/uL (ref 4.22–5.81)
RDW: 11.9 % (ref 11.5–15.5)
WBC: 11.8 10*3/uL — ABNORMAL HIGH (ref 4.0–10.5)
nRBC: 0 % (ref 0.0–0.2)

## 2019-01-27 MED ORDER — ALBUTEROL SULFATE HFA 108 (90 BASE) MCG/ACT IN AERS: 2.0000 | INHALATION_SPRAY | RESPIRATORY_TRACT | 0 refills | Status: DC | PRN

## 2019-01-27 MED ORDER — PREDNISONE 50 MG PO TABS: 50.0000 mg | ORAL_TABLET | Freq: Every day | ORAL | 0 refills | Status: DC

## 2019-01-27 NOTE — Discharge Summary (Signed)
Physician Discharge Summary  Joseph Fitzgerald QQV:[id number] DOB: October 09, 1971 DOA: 01/25/2019  PCP: Ladell Pier, MD  Admit date: 01/25/2019 Discharge date: 01/27/2019  Admitted From: Home Disposition:  Home   Recommendations for Outpatient Follow-up:  1. Follow up with PCP in 1 week 2. Please obtain CBC in 1 week   Discharge Condition: Stable CODE STATUS: Full  Diet recommendation: Regular   Brief/Interim Summary: Joseph Fitzgerald a 48 y.o.malewith medical history significant foreczema and persistent asthma, now presenting to the emergency department for evaluation of shortness of breath, wheezing, fevers, chills, mild rhinorrhea, and cough productive of thick clear sputum. Patient reports that he was in his usual state until early today when he developed subjective fevers and chills, mild rhinorrhea, cough productive of thick clear sputum, and shortness of breath with wheezing. He was not able to get much relief from his inhalers at home, continued to worsen throughout the course of the day, and eventually comes in for evaluation. He denies any recent international travel, sick contacts, smoking, or e-cig/vape use. He was admitted for asthma exacerbation. Influenza negative. He was started on BiPAP due to respiratory failure, was able to be weaned off. He was treated with nebs, solumedrol with improvement prior to discharge home.   Discharge Diagnoses:  Principal Problem:   Acute asthma exacerbation Active Problems:   Acute respiratory failure with hypoxia (HCC) Leukocytosis  Discharge Instructions  Discharge Instructions    Call MD for:  difficulty breathing, headache or visual disturbances   Complete by:  As directed    Call MD for:  extreme fatigue   Complete by:  As directed    Call MD for:  persistant dizziness or light-headedness   Complete by:  As directed    Diet general   Complete by:  As directed    Discharge instructions   Complete by:  As directed    You were  cared for by a hospitalist during your hospital stay. If you have any questions about your discharge medications or the care you received while you were in the hospital after you are discharged, you can call the unit and ask to speak with the hospitalist on call if the hospitalist that took care of you is not available. Once you are discharged, your primary care physician will handle any further medical issues. Please note that NO REFILLS for any discharge medications will be authorized once you are discharged, as it is imperative that you return to your primary care physician (or establish a relationship with a primary care physician if you do not have one) for your aftercare needs so that they can reassess your need for medications and monitor your lab values.   Increase activity slowly   Complete by:  As directed      Allergies as of 01/27/2019      Reactions   Penicillins Rash      Medication List    TAKE these medications   albuterol 108 (90 Base) MCG/ACT inhaler Commonly known as:  PROVENTIL HFA;VENTOLIN HFA Inhale 2 puffs into the lungs every 4 (four) hours as needed for wheezing or shortness of breath. What changed:    how much to take  how to take this  when to take this  reasons to take this  additional instructions  Another medication with the same name was removed. Continue taking this medication, and follow the directions you see here.   dextromethorphan-guaiFENesin 30-600 MG 12hr tablet Commonly known as:  MUCINEX DM Take 1 tablet by  mouth 2 (two) times daily as needed for cough.   fluticasone 50 MCG/ACT nasal spray Commonly known as:  FLONASE Place 2 sprays into both nostrils daily.   fluticasone furoate-vilanterol 200-25 MCG/INH Aepb Commonly known as:  BREO ELLIPTA Inhale 1 puff into the lungs daily.   loratadine 10 MG tablet Commonly known as:  CLARITIN Take 1 tablet (10 mg total) by mouth daily.   predniSONE 50 MG tablet Commonly known as:   DELTASONE Take 1 tablet (50 mg total) by mouth daily with breakfast.   triamcinolone cream 0.1 % Commonly known as:  KENALOG Apply 1 application topically 2 (two) times daily.      Follow-up Information    Ladell Pier, MD. Schedule an appointment as soon as possible for a visit in 1 week(s).   Specialty:  Internal Medicine Contact information: [address] [place] [address] [phone]          Allergies  Allergen Reactions  . Penicillins Rash    Consultations:  None   Procedures/Studies: Dg Chest Port 1 View  Result Date: 01/25/2019 CLINICAL DATA:  Shortness of breath and cough tonight. History of asthma. EXAM: PORTABLE CHEST 1 VIEW COMPARISON:  03/16/2018 FINDINGS: The heart size and mediastinal contours are within normal limits. Both lungs are clear. The visualized skeletal structures are unremarkable. IMPRESSION: No active disease. Electronically Signed   By: Lucienne Capers M.D.   On: 01/25/2019 20:35      Discharge Exam: Vitals:   01/26/19 2333 01/27/19 0430  BP: 120/72 117/75  Pulse: 73 64  Resp: 20 15  Temp: 98.4 F (36.9 C) 98.1 F (36.7 C)  SpO2: 95% 94%    General: Pt is alert, awake, not in acute distress Cardiovascular: Reg rhythm, rate 50s, S1/S2 +, no rubs, no gallops Respiratory: Minimal wheezes, no respiratory distress, no conversational dyspnea  Abdominal: Soft, NT, ND, bowel sounds + Extremities: no edema, no cyanosis    The results of significant diagnostics from this hospitalization (including imaging, microbiology, ancillary and laboratory) are listed below for reference.     Microbiology: Recent Results (from the past 240 hour(s))  Culture, blood (routine x 2)     Status: None (Preliminary result)   Collection Time: 01/25/19  8:27 PM  Result Value Ref Range Status   Specimen Description BLOOD RIGHT FOREARM  Final   Special Requests   Final    BOTTLES DRAWN AEROBIC AND ANAEROBIC Blood Culture adequate volume    Culture   Final    NO GROWTH < 24 HOURS Performed at [facility], [address], [place], [place] [address]    Report Status PENDING  Incomplete  Culture, blood (routine x 2)     Status: None (Preliminary result)   Collection Time: 01/25/19  8:27 PM  Result Value Ref Range Status   Specimen Description BLOOD LEFT FOREARM  Final   Special Requests   Final    BOTTLES DRAWN AEROBIC AND ANAEROBIC Blood Culture adequate volume   Culture   Final    NO GROWTH < 24 HOURS Performed at [facility], [place] [address] [place], [place] [address]    Report Status PENDING  Incomplete     Labs: BNP (last 3 results) No results for input(s): BNP in the last 8760 hours. Basic Metabolic Panel: Recent Labs  Lab 01/25/19 2013 01/26/19 0256  NA 140 138  K 3.5 4.0  CL 106 105  CO2 26 25  GLUCOSE 130* 162*  BUN 11 10  CREATININE 0.93 0.87  CALCIUM 9.1 8.6*   Liver Function Tests: No results for input(s): AST, ALT, ALKPHOS, BILITOT, PROT, ALBUMIN in the last 168 hours. No results for input(s): LIPASE, AMYLASE in the last 168 hours. No results for input(s): AMMONIA in the last 168 hours. CBC: Recent Labs  Lab 01/25/19 2013 01/26/19 0256 01/27/19 0244  WBC 13.3* 12.4* 11.8*  NEUTROABS 11.3* 12.0*  --   HGB 16.5 14.7 15.1  HCT 48.3 43.7 43.9  MCV 91.3 91.4 90.9  PLT 252 227 237   Cardiac Enzymes: No results for input(s): CKTOTAL, CKMB, CKMBINDEX, TROPONINI in the last 168 hours. BNP: Invalid input(s): POCBNP CBG: Recent Labs  Lab 01/25/19 2002  GLUCAP 123*   D-Dimer No results for input(s): DDIMER in the last 72 hours. Hgb A1c No results for input(s): HGBA1C in the last 72 hours. Lipid Profile No results for input(s): CHOL, HDL, LDLCALC, TRIG, CHOLHDL, LDLDIRECT in the last 72 hours. Thyroid function studies No results for input(s): TSH, T4TOTAL, T3FREE, THYROIDAB in the last 72 hours.  Invalid input(s): FREET3 Anemia work up No results for input(s):  VITAMINB12, FOLATE, FERRITIN, TIBC, IRON, RETICCTPCT in the last 72 hours. Urinalysis No results found for: COLORURINE, APPEARANCEUR, [place], [place], [place], [place], [place], [place], PROTEINUR, UROBILINOGEN, NITRITE, LEUKOCYTESUR Sepsis Labs Invalid input(s): PROCALCITONIN,  WBC,  LACTICIDVEN Microbiology Recent Results (from the past 240 hour(s))  Culture, blood (routine x 2)     Status: None (Preliminary result)   Collection Time: 01/25/19  8:27 PM  Result Value Ref Range Status   Specimen Description BLOOD RIGHT FOREARM  Final   Special Requests   Final    BOTTLES DRAWN AEROBIC AND ANAEROBIC Blood Culture adequate volume   Culture   Final    NO GROWTH < 24 HOURS Performed at [facility], [address] [place], [place] [address]    Report Status PENDING  Incomplete  Culture, blood (routine x 2)     Status: None (Preliminary result)   Collection Time: 01/25/19  8:27 PM  Result Value Ref Range Status   Specimen Description BLOOD LEFT FOREARM  Final   Special Requests   Final    BOTTLES DRAWN AEROBIC AND ANAEROBIC Blood Culture adequate volume   Culture   Final    NO GROWTH < 24 HOURS Performed at [facility], [place] [address] [place], [place] [address]    Report Status PENDING  Incomplete     Patient was seen and examined on the day of discharge and was found to be in stable condition. Time coordinating discharge: 25 minutes including assessment and coordination of care, as well as examination of the patient.   SIGNED:  Dessa Phi, DO [facility] [email or web address] 01/27/2019, 8:26 AM

## 2019-01-30 LAB — CULTURE, BLOOD (ROUTINE X 2)
Culture: NO GROWTH
Culture: NO GROWTH
Special Requests: ADEQUATE
Special Requests: ADEQUATE

## 2019-02-25 ENCOUNTER — Ambulatory Visit: Payer: 59 | Admitting: Internal Medicine

## 2019-03-14 MED FILL — $BREO ELLIPTA 200-25 MCG IN: 200-25 | 90 days supply | Qty: 180 | Fill #1

## 2019-04-12 ENCOUNTER — Other Ambulatory Visit: Payer: Self-pay

## 2019-04-12 ENCOUNTER — Ambulatory Visit: Payer: 59 | Attending: Internal Medicine | Admitting: Internal Medicine

## 2019-04-12 DIAGNOSIS — Z9109 Other allergy status, other than to drugs and biological substances: Secondary | ICD-10-CM

## 2019-04-12 DIAGNOSIS — J4540 Moderate persistent asthma, uncomplicated: Secondary | ICD-10-CM

## 2019-04-12 MED ORDER — FLUTICASONE FUROATE-VILANTEROL 200-25 MCG/INH IN AEPB: 1.0000 | INHALATION_SPRAY | Freq: Every day | RESPIRATORY_TRACT | 6 refills | Status: DC

## 2019-04-12 MED ORDER — ALBUTEROL SULFATE HFA 108 (90 BASE) MCG/ACT IN AERS: 2.0000 | INHALATION_SPRAY | RESPIRATORY_TRACT | 0 refills | Status: DC | PRN

## 2019-04-12 MED ORDER — LORATADINE 10 MG PO TABS: 10.0000 mg | ORAL_TABLET | Freq: Every day | ORAL | 11 refills | Status: DC

## 2019-04-12 MED FILL — BREO ELLIPTA 200-25 MCG INH: 200-25 | 30 days supply | Qty: 60 | Fill #0

## 2019-04-12 NOTE — Progress Notes (Signed)
Virtual Visit via Telephone Note Due to current restrictions/limitations of in-office visits due to the COVID-19 pandemic, this scheduled clinical appointment was converted to a telehealth visit  I connected with Joseph Fitzgerald on 04/12/19 at 3:29 p.m EDT by telephone and verified that I am speaking with the correct person using two identifiers. I am in my office.  The patient is at home.  Only the patient and myself participated in this encounter.  I discussed the limitations, risks, security and privacy concerns of performing an evaluation and management service by telephone and the availability of in person appointments. I also discussed with the patient that there may be a patient responsible charge related to this service. The patient expressed understanding and agreed to proceed.   History of Present Illness: Pt with mod persistent asthma, obesity, environmental allergiesand eczema.  Last seen 08/2018   Since last visit with me, patient was admitted to the hospital for 2 days in February for acute asthma exacerbation with respiratory failure requiring BiPAP. Since then pt states he has been doing okay.  Denies increase cough or SOB.  No fever.   Reports compliance with Breo.  Uses Albuterol a few times a wk.  Not taking Claritin.  Feels he does not need it.  Endorses itchy eyes and sneezing.   Observations/Objective: No direct observation done as this was a telephone encounter  Assessment and Plan: 1. Moderate persistent asthma without complication It sounds as though he is doing well on his current maintenance inhaler.  We will continue Breo daily and albuterol as needed. - fluticasone furoate-vilanterol (BREO ELLIPTA) 200-25 MCG/INH AEPB; Inhale 1 puff into the lungs daily.  Dispense: 60 each; Refill: 6 - loratadine (CLARITIN) 10 MG tablet; Take 1 tablet (10 mg total) by mouth daily.  Dispense: 30 tablet; Refill: 11  2. Environmental allergies Encouraged him to use the Claritin as  needed as he does have symptoms of allergies.  He will continue Flonase - loratadine (CLARITIN) 10 MG tablet; Take 1 tablet (10 mg total) by mouth daily.  Dispense: 30 tablet; Refill: 11   Follow Up Instructions: F/u in 2 mths   I discussed the assessment and treatment plan with the patient. The patient was provided an opportunity to ask questions and all were answered. The patient agreed with the plan and demonstrated an understanding of the instructions.   The patient was advised to call back or seek an in-person evaluation if the symptoms worsen or if the condition fails to improve as anticipated.  I provided 7 minutes of non-face-to-face time during this encounter.   Karle Plumber, MD

## 2019-04-16 MED FILL — VENTOLIN HFA 90 MCG INHALER: 108 (90 BAS | 16 days supply | Qty: 18 | Fill #0

## 2019-08-29 MED FILL — BREO ELLIPTA 200-25 MCG INH: 200-25 | 30 days supply | Qty: 60 | Fill #1

## 2019-09-08 ENCOUNTER — Other Ambulatory Visit: Payer: Self-pay

## 2019-09-08 ENCOUNTER — Inpatient Hospital Stay (HOSPITAL_COMMUNITY)
Admission: EM | Admit: 2019-09-08 | Discharge: 2019-09-10 | DRG: 203 | Disposition: A | Discharge status: Home / Self Care | Payer: 59 | Attending: Internal Medicine | Admitting: Internal Medicine

## 2019-09-08 ENCOUNTER — Emergency Department (HOSPITAL_COMMUNITY): Payer: 59

## 2019-09-08 ENCOUNTER — Encounter (HOSPITAL_COMMUNITY): Payer: Self-pay | Admitting: *Deleted

## 2019-09-08 DIAGNOSIS — J45901 Unspecified asthma with (acute) exacerbation: Secondary | ICD-10-CM | POA: Diagnosis present

## 2019-09-08 DIAGNOSIS — Z7951 Long term (current) use of inhaled steroids: Secondary | ICD-10-CM

## 2019-09-08 DIAGNOSIS — R7981 Abnormal blood-gas level: Secondary | ICD-10-CM

## 2019-09-08 DIAGNOSIS — R739 Hyperglycemia, unspecified: Secondary | ICD-10-CM | POA: Diagnosis not present

## 2019-09-08 DIAGNOSIS — Z9109 Other allergy status, other than to drugs and biological substances: Secondary | ICD-10-CM | POA: Diagnosis present

## 2019-09-08 DIAGNOSIS — J4541 Moderate persistent asthma with (acute) exacerbation: Secondary | ICD-10-CM | POA: Diagnosis not present

## 2019-09-08 DIAGNOSIS — Z825 Family history of asthma and other chronic lower respiratory diseases: Secondary | ICD-10-CM

## 2019-09-08 DIAGNOSIS — E1165 Type 2 diabetes mellitus with hyperglycemia: Secondary | ICD-10-CM | POA: Diagnosis present

## 2019-09-08 DIAGNOSIS — F1290 Cannabis use, unspecified, uncomplicated: Secondary | ICD-10-CM | POA: Diagnosis present

## 2019-09-08 DIAGNOSIS — F17290 Nicotine dependence, other tobacco product, uncomplicated: Secondary | ICD-10-CM | POA: Diagnosis present

## 2019-09-08 DIAGNOSIS — Z20828 Contact with and (suspected) exposure to other viral communicable diseases: Secondary | ICD-10-CM | POA: Diagnosis present

## 2019-09-08 DIAGNOSIS — Z833 Family history of diabetes mellitus: Secondary | ICD-10-CM

## 2019-09-08 DIAGNOSIS — Z23 Encounter for immunization: Secondary | ICD-10-CM

## 2019-09-08 LAB — CBC WITH DIFFERENTIAL/PLATELET
Abs Immature Granulocytes: 0.02 10*3/uL (ref 0.00–0.07)
Basophils Absolute: 0.1 10*3/uL (ref 0.0–0.1)
Basophils Relative: 1 %
Eosinophils Absolute: 0.8 10*3/uL — ABNORMAL HIGH (ref 0.0–0.5)
Eosinophils Relative: 11 %
HCT: 50.5 % (ref 39.0–52.0)
Hemoglobin: 17.1 g/dL — ABNORMAL HIGH (ref 13.0–17.0)
Immature Granulocytes: 0 %
Lymphocytes Relative: 19 %
Lymphs Abs: 1.3 10*3/uL (ref 0.7–4.0)
MCH: 31.7 pg (ref 26.0–34.0)
MCHC: 33.9 g/dL (ref 30.0–36.0)
MCV: 93.7 fL (ref 80.0–100.0)
Monocytes Absolute: 0.5 10*3/uL (ref 0.1–1.0)
Monocytes Relative: 7 %
Neutro Abs: 4 10*3/uL (ref 1.7–7.7)
Neutrophils Relative %: 62 %
Platelets: 237 10*3/uL (ref 150–400)
RBC: 5.39 MIL/uL (ref 4.22–5.81)
RDW: 12.1 % (ref 11.5–15.5)
WBC: 6.6 10*3/uL (ref 4.0–10.5)
nRBC: 0 % (ref 0.0–0.2)

## 2019-09-08 LAB — CBC
HCT: 47.8 % (ref 39.0–52.0)
Hemoglobin: 16.7 g/dL (ref 13.0–17.0)
MCH: 32.6 pg (ref 26.0–34.0)
MCHC: 34.9 g/dL (ref 30.0–36.0)
MCV: 93.2 fL (ref 80.0–100.0)
Platelets: 231 10*3/uL (ref 150–400)
RBC: 5.13 MIL/uL (ref 4.22–5.81)
RDW: 12 % (ref 11.5–15.5)
WBC: 8.8 10*3/uL (ref 4.0–10.5)
nRBC: 0 % (ref 0.0–0.2)

## 2019-09-08 LAB — BASIC METABOLIC PANEL
Anion gap: 13 (ref 5–15)
BUN: 14 mg/dL (ref 6–20)
CO2: 22 mmol/L (ref 22–32)
Calcium: 9.3 mg/dL (ref 8.9–10.3)
Chloride: 104 mmol/L (ref 98–111)
Creatinine, Ser: 0.82 mg/dL (ref 0.61–1.24)
GFR calc Af Amer: >60 mL/min (ref >60)
GFR calc non Af Amer: >60 mL/min (ref >60)
Glucose, Bld: 98 mg/dL (ref 70–99)
Potassium: 4 mmol/L (ref 3.5–5.1)
Sodium: 139 mmol/L (ref 135–145)

## 2019-09-08 LAB — CREATININE, SERUM
Creatinine, Ser: 0.91 mg/dL (ref 0.61–1.24)
GFR calc Af Amer: >60 mL/min (ref >60)
GFR calc non Af Amer: >60 mL/min (ref >60)

## 2019-09-08 LAB — HIV ANTIBODY (ROUTINE TESTING W REFLEX): HIV Screen 4th Generation wRfx: NONREACTIVE

## 2019-09-08 MED ORDER — SODIUM CHLORIDE 0.9% FLUSH
3.0000 mL | Freq: Two times a day (BID) | INTRAVENOUS | Status: DC
  Administered 2019-09-09 – 2019-09-10 (×3): 3 mL via INTRAVENOUS

## 2019-09-08 MED ORDER — METHYLPREDNISOLONE SODIUM SUCC 40 MG IJ SOLR
40.0000 mg | Freq: Four times a day (QID) | INTRAMUSCULAR | Status: AC
  Administered 2019-09-09 (×2): 40 mg via INTRAVENOUS
  Filled 2019-09-08 (×2): qty 1

## 2019-09-08 MED ORDER — SODIUM CHLORIDE 0.9% FLUSH: 3.0000 mL | INTRAVENOUS | Status: DC | PRN

## 2019-09-08 MED ORDER — ALBUTEROL SULFATE HFA 108 (90 BASE) MCG/ACT IN AERS
2.0000 | INHALATION_SPRAY | RESPIRATORY_TRACT | Status: DC
  Administered 2019-09-08 – 2019-09-09 (×2): 2 via RESPIRATORY_TRACT

## 2019-09-08 MED ORDER — METHYLPREDNISOLONE SODIUM SUCC 40 MG IJ SOLR: 40.0000 mg | Freq: Four times a day (QID) | INTRAMUSCULAR | Status: DC

## 2019-09-08 MED ORDER — ALBUTEROL SULFATE HFA 108 (90 BASE) MCG/ACT IN AERS
8.0000 | INHALATION_SPRAY | Freq: Once | RESPIRATORY_TRACT | Status: AC
  Administered 2019-09-08: 8 via RESPIRATORY_TRACT

## 2019-09-08 MED ORDER — METHYLPREDNISOLONE SODIUM SUCC 125 MG IJ SOLR
125.0000 mg | Freq: Once | INTRAMUSCULAR | Status: AC
  Administered 2019-09-08: 18:00:00 125 mg via INTRAVENOUS
  Filled 2019-09-08: qty 2

## 2019-09-08 MED ORDER — MAGNESIUM SULFATE 2 GM/50ML IV SOLN
2.0000 g | Freq: Once | INTRAVENOUS | Status: AC
  Administered 2019-09-08: 18:00:00 2 g via INTRAVENOUS
  Filled 2019-09-08: qty 50

## 2019-09-08 MED ORDER — SODIUM CHLORIDE 0.9 % IV SOLN: 250.0000 mL | INTRAVENOUS | Status: DC | PRN

## 2019-09-08 MED ORDER — AEROCHAMBER PLUS FLO-VU LARGE MISC
1.0000 | Freq: Once | Status: AC
  Administered 2019-09-08: 1

## 2019-09-08 MED ORDER — ALBUTEROL SULFATE HFA 108 (90 BASE) MCG/ACT IN AERS
8.0000 | INHALATION_SPRAY | Freq: Once | RESPIRATORY_TRACT | Status: AC
  Administered 2019-09-08: 8 via RESPIRATORY_TRACT
  Filled 2019-09-08: qty 6.7

## 2019-09-08 MED ORDER — ENOXAPARIN SODIUM 40 MG/0.4ML ~~LOC~~ SOLN
40.0000 mg | Freq: Every day | SUBCUTANEOUS | Status: DC
  Administered 2019-09-09 – 2019-09-10 (×2): 40 mg via SUBCUTANEOUS
  Filled 2019-09-08 (×2): qty 0.4

## 2019-09-08 MED ORDER — AEROCHAMBER PLUS FLO-VU LARGE MISC
1.0000 | Freq: Once | Status: AC
  Administered 2019-09-08: 18:00:00 1

## 2019-09-08 MED ORDER — METHYLPREDNISOLONE SODIUM SUCC 125 MG IJ SOLR: 80.0000 mg | Freq: Two times a day (BID) | INTRAMUSCULAR | Status: DC

## 2019-09-08 MED ORDER — PREDNISONE 20 MG PO TABS
20.0000 mg | ORAL_TABLET | Freq: Four times a day (QID) | ORAL | Status: AC
  Administered 2019-09-09 – 2019-09-10 (×4): 20 mg via ORAL
  Filled 2019-09-08 (×4): qty 1

## 2019-09-08 MED ORDER — AEROCHAMBER PLUS FLO-VU LARGE MISC
Status: AC
  Filled 2019-09-08: qty 1

## 2019-09-08 MED ORDER — FLUTICASONE FUROATE-VILANTEROL 200-25 MCG/INH IN AEPB
1.0000 | INHALATION_SPRAY | Freq: Every day | RESPIRATORY_TRACT | Status: DC
  Administered 2019-09-10: 1 via RESPIRATORY_TRACT
  Filled 2019-09-08: qty 28

## 2019-09-08 MED ORDER — ACETAMINOPHEN 325 MG PO TABS: 650.0000 mg | ORAL_TABLET | Freq: Four times a day (QID) | ORAL | Status: DC | PRN

## 2019-09-08 MED ORDER — ACETAMINOPHEN 650 MG RE SUPP: 650.0000 mg | Freq: Four times a day (QID) | RECTAL | Status: DC | PRN

## 2019-09-08 NOTE — H&P (Signed)
History and Physical    Joseph Fitzgerald GEZ:[id number] DOB: Mar 07, 1971 DOA: 09/08/2019  PCP: Ladell Pier, MD (Confirm with patient/family/NH records and if not entered, this has to be entered at [facility] point of entry) Patient coming from: Patient is coming from home  I have personally briefly reviewed patient's old medical records in [place]  Chief Complaint: Increased wheezing  HPI: Joseph Fitzgerald is a 48 y.o. male with medical history significant of asthma, allergies and eczema.  He has history of prior hospitalizations for asthma flares.  He is never required intubation.  Patient reports he has been adherent to his medications including Breo daily and albuterol  metered-dose inhaler as needed.  He does not smoke cigarettes but does admit  having recently smoked THC.  For the past 36 hours he had increasing wheezing that has not responded to his usual home regimen.  He does not use oxygen at home.  Does not have a home pulse oximeter.  Because of his continued wheezing he presents to the [facility] emergency department for evaluation.  ED Course: In the emergency department patient was hemodynamically stable.  His oxygen saturation on room air was 91%.  Was noted to have inspiratory and expiratory wheezing.  Given albuterol metered-dose inhaler with AeroChamber 8 puffs x 2 magnesium.  Patient was given Solu-Medrol 125 mg IV.  Patient continued to have mild wheezing.  He was referred to [facility] for observation admit stabilize his asthma.  Review of Systems: As per HPI otherwise 10 point review of systems negative.    Past Medical History:  Diagnosis Date  . Asthma   . Diabetes mellitus without complication ([place])   . Shortness of breath dyspnea     Past Surgical History:  Procedure Laterality Date  . NO PAST SURGERIES     Social history-patient is married.  He has 4 children: 3 boys 1 girl.  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 Fortunately he is continued to have worked during  [employer].  He reports his wife is in good health.  She also works.  He denies any covert exposures   reports that he has never smoked. His smokeless tobacco use includes chew. He reports current alcohol use. He reports that he does not use drugs.  Allergies  Allergen Reactions  . Penicillins Rash    Did it involve swelling of the face/tongue/throat, SOB, or low BP?No Did it involve sudden or severe rash/hives, skin peeling, or any reaction on the inside of your mouth or nose?Yes Did you need to seek medical attention at a hospital or doctor's office? no When did it last happen?Child If all above answers are "NO", may proceed with cephalosporin use.    Family History  Problem Relation Age of Onset  . Asthma Brother   . Diabetes Brother      Prior to Admission medications   Medication Sig Start Date End Date Taking? Authorizing Provider  albuterol (VENTOLIN HFA) 108 (90 Base) MCG/ACT inhaler Inhale 2 puffs into the lungs every 4 (four) hours as needed for wheezing or shortness of breath. 04/12/19  Yes Ladell Pier, MD  fluticasone furoate-vilanterol (BREO ELLIPTA) 200-25 MCG/INH AEPB Inhale 1 puff into the lungs daily. 04/12/19  Yes Ladell Pier, MD    Physical Exam: Vitals:   09/08/19 1616 09/08/19 1705 09/08/19 1705 09/08/19 1845  BP: 128/90 (!) 139/91  124/87  Pulse: 93   92  Resp: 20 17  17   Temp: 98.6 F (37 C)  TempSrc: Oral     SpO2: 93% 93% 96% 93%    Constitutional: NAD, calm, comfortable Vitals:   09/08/19 1616 09/08/19 1705 09/08/19 1705 09/08/19 1845  BP: 128/90 (!) 139/91  124/87  Pulse: 93   92  Resp: 20 17  17   Temp: 98.6 F (37 C)     TempSrc: Oral     SpO2: 93% 93% 96% 93%   Eyes: PERRL, lids and conjunctivae normal ENMT: Mucous membranes are moist. Posterior pharynx clear of any exudate or lesions.Normal dentition.  Neck: normal, supple, no masses, no thyromegaly Respiratory: clear to auscultation bilaterally, no wheezing, no crackles.  Normal respiratory effort. No accessory muscle use.  Cardiovascular: Regular rate and rhythm, no murmurs / rubs / gallops. No extremity edema. 2+ pedal pulses. No carotid bruits.  Abdomen: no tenderness, no masses palpated. No hepatosplenomegaly. Bowel sounds positive.  Musculoskeletal: no clubbing / cyanosis. No joint deformity upper and lower extremities. Good ROM, no contractures. Normal muscle tone.  Skin: no rashes, lesions, ulcers. No induration Neurologic: CN 2-12 grossly intact. Sensation intact, DTR normal. Strength 5/5 in all 4.  Psychiatric: Normal judgment and insight. Alert and oriented x 3. Normal mood.   (Anything < 9 systems with 2 bullets each down codes to level 1) (If patient refuses exam can't bill higher level) (Make sure to document decubitus ulcers present on admission -- if possible -- and whether patient has chronic indwelling catheter at time of admission)  Labs on Admission: I have personally reviewed following labs and imaging studies  CBC: Recent Labs  Lab 09/08/19 1755  WBC 6.6  NEUTROABS 4.0  HGB 17.1*  HCT 50.5  MCV 93.7  PLT 812   Basic Metabolic Panel: Recent Labs  Lab 09/08/19 1755  NA 139  K 4.0  CL 104  CO2 22  GLUCOSE 98  BUN 14  CREATININE 0.82  CALCIUM 9.3   GFR: CrCl cannot be calculated (Unknown ideal weight.). Liver Function Tests: No results for input(s): AST, ALT, ALKPHOS, BILITOT, PROT, ALBUMIN in the last 168 hours. No results for input(s): LIPASE, AMYLASE in the last 168 hours. No results for input(s): AMMONIA in the last 168 hours. Coagulation Profile: No results for input(s): INR, PROTIME in the last 168 hours. Cardiac Enzymes: No results for input(s): CKTOTAL, CKMB, CKMBINDEX, TROPONINI in the last 168 hours. BNP (last 3 results) No results for input(s): PROBNP in the last 8760 hours. HbA1C: No results for input(s): HGBA1C in the last 72 hours. CBG: No results for input(s): GLUCAP in the last 168 hours. Lipid  Profile: No results for input(s): CHOL, HDL, LDLCALC, TRIG, CHOLHDL, LDLDIRECT in the last 72 hours. Thyroid Function Tests: No results for input(s): TSH, T4TOTAL, FREET4, T3FREE, THYROIDAB in the last 72 hours. Anemia Panel: No results for input(s): VITAMINB12, FOLATE, FERRITIN, TIBC, IRON, RETICCTPCT in the last 72 hours. Urine analysis: No results found for: COLORURINE, APPEARANCEUR, LABSPEC, PHURINE, GLUCOSEU, HGBUR, BILIRUBINUR, KETONESUR, PROTEINUR, UROBILINOGEN, NITRITE, LEUKOCYTESUR  Radiological Exams on Admission: Dg Chest Port 1 View  Result Date: 09/08/2019 CLINICAL DATA:  Pt arrives with c/o asthma since yesterday. Says he has felt somewhat SOB and wheezing, using his inhalers without relief. Mild expiratory wheezing throughout, +cough. No fevers. EXAM: PORTABLE CHEST 1 VIEW COMPARISON:  Chest radiograph 01/25/2019 FINDINGS: The heart size and mediastinal contours are within normal limits. The lungs are clear. No pneumothorax or significant pleural effusion. The visualized skeletal structures are unremarkable. IMPRESSION: No evidence of active disease in the chest. Electronically Signed  By: Audie Pinto M.D.   On: 09/08/2019 17:45    EKG: Independently reviewed.  Patient has a normal sinus rhythm and a normal EKG  Assessment/Plan Active Problems:   Environmental allergies   Acute asthma exacerbation   Asthma exacerbation  (please populate well all problems here in Problem List. (For example, if patient is on BP meds at home and you resume or decide to hold them, it is a problem that needs to be her. Same for CAD, COPD, HLD and so on)   1.  Acute asthma exacerbation -patient with a longstanding history of asthma generally controlled.  Resents to the emergency department because of refractory wheezing.  Following meant in the ED he continued to have expiratory wheezing and is at risk for persistent exacerbation. Plan observation admission  Continue Breo inhalational  therapy  Albuterol metered-dose inhaler with AeroChamber 2 puffs every 4 hours x6 doses  Solu-Medrol 40 mg IV every 6 2 doses then convert to prednisone 20 mg every 6 and taper as   Tolerated  For oxygen saturation on room air of less than 90% patient will receive 2 L of oxygen per nasal   cannula    DVT prophylaxis: Lovenox (Lovenox/Heparin/SCD's/anticoagulated/None (if comfort care) Code Status: Full code (Full/Partial (specify details) Family Communication: Spoke with family member by phone.  They had no questions.  (Specify name, relationship. Do not write "discussed with patient". Specify tel # if discussed over the phone) Disposition Plan: Home in 24 to 48 hours (specify when and where you expect patient to be discharged) Consults called: None (with names) Admission status: Observation (inpatient / obs / tele / medical floor / SDU)   Adella Hare MD [facility] Pager [phone]  If 7PM-7AM, please contact night-coverage [email or web address] Password [email or web address]  09/08/2019, 9:15 PM

## 2019-09-08 NOTE — ED Triage Notes (Signed)
Pt arrives with c/o asthma since yesterday. Says he has felt somewhat SOB and wheezing, using his inhalers without relief. Mild expiratory wheezing throughout, +cough. No fevers.

## 2019-09-08 NOTE — ED Provider Notes (Signed)
[facility] EMERGENCY DEPARTMENT Provider Note   CSN: [id number] Arrival date & time: 09/08/19  1552     History   Chief Complaint Chief Complaint  Patient presents with  . Asthma    HPI Joseph Fitzgerald is a 48 y.o. male with history of eczema, asthma presents today for evaluation of shortness of breath that began yesterday associated with wheezing, dry cough and chest tightness.  He uses ventolin and Breo for his asthma and these have not been helping.  He denies any fever, congestion, sore throat, sputum, vomiting, diarrhea, abdominal pain.  No tobacco use.  No sick contacts or travel.  No known exposure to COVID-19.  No lower extremity edema or calf pain.  Reports in the past he has had similar symptoms when he has an asthma flare.      HPI  Past Medical History:  Diagnosis Date  . Asthma   . Diabetes mellitus without complication ([place])   . Shortness of breath dyspnea     Patient Active Problem List   Diagnosis Date Noted  . Acute asthma exacerbation 01/25/2019  . Hypokalemia 03/17/2018  . Drug-induced hyperglycemia 03/17/2018  . Asthma, moderate persistent 08/17/2016  . Vitamin D deficiency 09/01/2015  . Family history of diabetes mellitus in brother 08/31/2015  . Eczema 08/31/2015  . Environmental allergies 04/16/2014  . Acute respiratory failure with hypoxia ([place]) 06/12/2013    Past Surgical History:  Procedure Laterality Date  . NO PAST SURGERIES          Home Medications    Prior to Admission medications   Medication Sig Start Date End Date Taking? Authorizing Provider  albuterol (VENTOLIN HFA) 108 (90 Base) MCG/ACT inhaler Inhale 2 puffs into the lungs every 4 (four) hours as needed for wheezing or shortness of breath. 04/12/19  Yes Ladell Pier, MD  fluticasone furoate-vilanterol (BREO ELLIPTA) 200-25 MCG/INH AEPB Inhale 1 puff into the lungs daily. 04/12/19  Yes Ladell Pier, MD  fluticasone (FLONASE) 50 MCG/ACT nasal spray  Place 2 sprays into both nostrils daily. Patient not taking: Reported on 01/25/2019 01/04/18   Ladell Pier, MD  loratadine (CLARITIN) 10 MG tablet Take 1 tablet (10 mg total) by mouth daily. 04/12/19   Ladell Pier, MD    Family History Family History  Problem Relation Age of Onset  . Asthma Brother   . Diabetes Brother     Social History Social History   Tobacco Use  . Smoking status: Never Smoker  . Smokeless tobacco: Current User    Types: Chew  Substance Use Topics  . Alcohol use: Yes    Comment: beer  . Drug use: No     Allergies   Penicillins   Review of Systems Review of Systems  Respiratory: Positive for cough, chest tightness, shortness of breath and wheezing.   All other systems reviewed and are negative.    Physical Exam Updated Vital Signs BP 124/87   Pulse 92   Temp 98.6 F (37 C) (Oral)   Resp 17   SpO2 93%   Physical Exam Vitals signs and nursing note reviewed.  Constitutional:      General: He is not in acute distress.    Appearance: He is well-developed.     Comments: NAD.  HENT:     Head: Normocephalic and atraumatic.     Right Ear: External ear normal.     Left Ear: External ear normal.     Nose: Nose normal.  Eyes:  General: No scleral icterus.    Conjunctiva/sclera: Conjunctivae normal.  Neck:     Musculoskeletal: Normal range of motion and neck supple.  Cardiovascular:     Rate and Rhythm: Normal rate and regular rhythm.     Heart sounds: Normal heart sounds.     Comments: 1+ radial and DP pulses bilaterally.  No lower extremity edema.  No calf tenderness. Pulmonary:     Effort: Pulmonary effort is normal.     Breath sounds: Wheezing present.     Comments: Inspiratory and expiratory wheezing in all lung fields.  Borderline tachypnea with RR in the low 20s.  Speaking in full sentences.  No increased work of breathing.  No crackles. Musculoskeletal: Normal range of motion.        General: No deformity.  Skin:     General: Skin is warm and dry.     Capillary Refill: Capillary refill takes less than 2 seconds.  Neurological:     Mental Status: He is alert and oriented to person, place, and time.  Psychiatric:        Behavior: Behavior normal.        Thought Content: Thought content normal.        Judgment: Judgment normal.      ED Treatments / Results  Labs (all labs ordered are listed, but only abnormal results are displayed) Labs Reviewed  CBC WITH DIFFERENTIAL/PLATELET - Abnormal; Notable for the following components:      Result Value   Hemoglobin 17.1 (*)    Eosinophils Absolute 0.8 (*)    All other components within normal limits  SARS CORONAVIRUS 2 (TAT 6-24 HRS)  BASIC METABOLIC PANEL  CBC WITH DIFFERENTIAL/PLATELET    EKG None  Radiology Dg Chest Port 1 View  Result Date: 09/08/2019 CLINICAL DATA:  Pt arrives with c/o asthma since yesterday. Says he has felt somewhat SOB and wheezing, using his inhalers without relief. Mild expiratory wheezing throughout, +cough. No fevers. EXAM: PORTABLE CHEST 1 VIEW COMPARISON:  Chest radiograph 01/25/2019 FINDINGS: The heart size and mediastinal contours are within normal limits. The lungs are clear. No pneumothorax or significant pleural effusion. The visualized skeletal structures are unremarkable. IMPRESSION: No evidence of active disease in the chest. Electronically Signed   By: Audie Pinto M.D.   On: 09/08/2019 17:45    Procedures Procedures (including critical care time)  Medications Ordered in ED Medications  magnesium sulfate IVPB 2 g 50 mL (0 g Intravenous Stopped 09/08/19 1959)  methylPREDNISolone sodium succinate (SOLU-MEDROL) 125 mg/2 mL injection 125 mg (125 mg Intravenous Given 09/08/19 1800)  albuterol (VENTOLIN HFA) 108 (90 Base) MCG/ACT inhaler 8 puff (8 puffs Inhalation Given 09/08/19 1746)  AeroChamber Plus Flo-Vu Large MISC 1 each (1 each Other Given 09/08/19 1747)  albuterol (VENTOLIN HFA) 108 (90 Base) MCG/ACT  inhaler 8 puff (8 puffs Inhalation Given 09/08/19 2002)     Initial Impression / Assessment and Plan / ED Course  I have reviewed the triage vital signs and the nursing notes.  Pertinent labs & imaging results that were available during my care of the patient were reviewed by me and considered in my medical decision making (see chart for details).  EMR reviewed.  6 months ago had hospitalization for hypoxemia related to COPD exacerbation/asthma.  Highest on DDX is mild/moderate asthma exacerbation.  Given current pandemic, flu season, could be related to underlying viral illness.  I considered ACS, heart failure unlikely as he has no previous history of this.  No  signs of hypervolemia.  No previous history of PE/DVT, lower extremity edema, calf tenderness and otherwise PERC negative.  He has diffuse inspiratory and expiratory wheezing with mild tachypnea upon initial evaluation but no significant respiratory distress.  SPO2 93% on room air but during my exam SPO2 has been greater than 95.  ER work-up reviewed by me is unremarkable.  No leukocytosis.  Hemoglobin is normal.  Chest x-ray is without infiltrates, edema, opacities, cardiomegaly.  We will give methylprednisolone, magnesium, albuterol per RT.  Will ambulate, reassess.  2040: ER work-up including labs, chest x-ray, EKG reviewed by me overall reassuring.  No leukocytosis.  Hemoglobin is normal.  Chest x-ray without edema, infiltrates, opacities, cardiomegaly.  Patient has been given Solu-Medrol, albuterol treatments x2, magnesium.  I reevaluated him and lungs have not significantly improved from initial evaluation prior to treatments.  SPO2 has been borderline 91% to 94% on room air on exertion.  Chart review shows he has had several hospitalizations for severe asthma exacerbation, hypoxemia requiring oxygen supplementation and ICU stays.  He does feel better and overall looks nontoxic however treatments in the ER have not significantly  improved his air entry and wheezing.  Given his previous hospitalizations, borderline oxygen saturations I discussed with patient options including observation to ensure there is no clinical decline versus discharge.  He is worried he may have to come back to the ER but is agreeable to either.  I have spoken to hospitalist Dr. Alma Friendly who will come see patient.  Appreciate his assistance.  I think he will benefit from observation/breathing tx.  He has no infectious symptoms however COVID-19 test has been ordered and pending. Final Clinical Impressions(s) / ED Diagnoses   Final diagnoses:  Moderate persistent asthma with exacerbation  Borderline low oxygen saturation level    ED Discharge Orders    None       Arlean Hopping 09/08/19 2041    Veryl Speak, MD 09/08/19 2118

## 2019-09-08 NOTE — ED Notes (Signed)
While ambulating pt, pt's O2 was between 91%-94%. Pt's pulse was 102-104. Pt asked how he felt while ambulating and pt stated, "Good".

## 2019-09-09 DIAGNOSIS — Z825 Family history of asthma and other chronic lower respiratory diseases: Secondary | ICD-10-CM | POA: Diagnosis not present

## 2019-09-09 DIAGNOSIS — F17290 Nicotine dependence, other tobacco product, uncomplicated: Secondary | ICD-10-CM | POA: Diagnosis present

## 2019-09-09 DIAGNOSIS — J4521 Mild intermittent asthma with (acute) exacerbation: Secondary | ICD-10-CM

## 2019-09-09 DIAGNOSIS — J4541 Moderate persistent asthma with (acute) exacerbation: Secondary | ICD-10-CM | POA: Diagnosis present

## 2019-09-09 DIAGNOSIS — Z23 Encounter for immunization: Secondary | ICD-10-CM | POA: Diagnosis not present

## 2019-09-09 DIAGNOSIS — Z20828 Contact with and (suspected) exposure to other viral communicable diseases: Secondary | ICD-10-CM | POA: Diagnosis present

## 2019-09-09 DIAGNOSIS — Z7951 Long term (current) use of inhaled steroids: Secondary | ICD-10-CM | POA: Diagnosis not present

## 2019-09-09 DIAGNOSIS — F1290 Cannabis use, unspecified, uncomplicated: Secondary | ICD-10-CM | POA: Diagnosis present

## 2019-09-09 DIAGNOSIS — R739 Hyperglycemia, unspecified: Secondary | ICD-10-CM | POA: Diagnosis not present

## 2019-09-09 DIAGNOSIS — E1165 Type 2 diabetes mellitus with hyperglycemia: Secondary | ICD-10-CM | POA: Diagnosis present

## 2019-09-09 DIAGNOSIS — Z833 Family history of diabetes mellitus: Secondary | ICD-10-CM | POA: Diagnosis not present

## 2019-09-09 LAB — GLUCOSE, CAPILLARY
Glucose-Capillary: 224 mg/dL — ABNORMAL HIGH (ref 70–99)
Glucose-Capillary: 194 mg/dL — ABNORMAL HIGH (ref 70–99)
Glucose-Capillary: 110 mg/dL — ABNORMAL HIGH (ref 70–99)
Glucose-Capillary: 140 mg/dL — ABNORMAL HIGH (ref 70–99)

## 2019-09-09 LAB — SARS CORONAVIRUS 2 (TAT 6-24 HRS): SARS Coronavirus 2: NEGATIVE

## 2019-09-09 MED ORDER — PNEUMOCOCCAL VAC POLYVALENT 25 MCG/0.5ML IJ INJ
0.5000 mL | INJECTION | INTRAMUSCULAR | Status: AC
  Administered 2019-09-10: 12:00:00 0.5 mL via INTRAMUSCULAR
  Filled 2019-09-09: qty 0.5

## 2019-09-09 MED ORDER — IPRATROPIUM-ALBUTEROL 0.5-2.5 (3) MG/3ML IN SOLN
3.0000 mL | Freq: Four times a day (QID) | RESPIRATORY_TRACT | Status: DC
  Administered 2019-09-10 (×2): 3 mL via RESPIRATORY_TRACT
  Filled 2019-09-09: qty 3

## 2019-09-09 MED ORDER — ALBUTEROL SULFATE (2.5 MG/3ML) 0.083% IN NEBU
2.5000 mg | INHALATION_SOLUTION | RESPIRATORY_TRACT | Status: DC
  Administered 2019-09-09 (×4): 2.5 mg via RESPIRATORY_TRACT
  Filled 2019-09-09 (×7): qty 3

## 2019-09-09 MED ORDER — INFLUENZA VAC SPLIT QUAD 0.5 ML IM SUSY
0.5000 mL | PREFILLED_SYRINGE | INTRAMUSCULAR | Status: AC
  Administered 2019-09-10: 0.5 mL via INTRAMUSCULAR
  Filled 2019-09-09: qty 0.5

## 2019-09-09 MED ORDER — IPRATROPIUM-ALBUTEROL 0.5-2.5 (3) MG/3ML IN SOLN: 3.0000 mL | RESPIRATORY_TRACT | Status: DC

## 2019-09-09 NOTE — Progress Notes (Signed)
[facility] PROGRESS NOTE  Joseph Fitzgerald [id number] DOB: 1971/07/12 DOA: 09/08/2019 PCP: Ladell Pier, MD  Assessment/Plan:  1.  Acute asthma exacerbation -patient with a longstanding history of asthma generally controlled.  Resented to the emergency department because of refractory wheezing. He had  Tachypnea but no increased work of breathing or hypoxia. Received solumedrol and nebs. This am, BS remain diminished with little air movement -continue scheduled nebs -continue solumedrol -add flutter valve -may benefit OP PFT as has had several admissions for same  2. Hyperglycemia. Likely related to solumedrol -monitor  -SSI as indicated  Code Status: full Family Communication: patient Disposition Plan: home likely tomorrow   Consultants:    Procedures:    Antibiotics:    HPI/Subjective: Sitting up in bed. Ate all of breakfast. Reports "breathing better". Denies pain/discomfort  Objective: Vitals:   09/09/19 0606 09/09/19 0800  BP: 110/84   Pulse: 81   Resp: 20   Temp: 98.1 F (36.7 C)   SpO2: 95% 95%    Intake/Output Summary (Last 24 hours) at 09/09/2019 1217 Last data filed at 09/09/2019 5003 Gross per 24 hour  Intake 53 ml  Output -  Net 53 ml   Filed Weights   09/09/19 0806  Weight: 71.7 kg    Exam:   General:  Awake alert no acute distress  Cardiovascular: rrr no mgr no LE edema  Respiratory: normal effort with conversation, BS quite diminished throughout. Faint end expiratory wheeze bilateral base. No crackles  Abdomen: non-distended soft +BS no guarding or rebounding  Musculoskeletal: joints without swelling/erythema   Data Reviewed: Basic Metabolic Panel: Recent Labs  Lab 09/08/19 1755 09/08/19 2052  NA 139  --   K 4.0  --   CL 104  --   CO2 22  --   GLUCOSE 98  --   BUN 14  --   CREATININE 0.82 0.91  CALCIUM 9.3  --    Liver Function Tests: No results for input(s): AST, ALT, ALKPHOS, BILITOT, PROT, ALBUMIN  in the last 168 hours. No results for input(s): LIPASE, AMYLASE in the last 168 hours. No results for input(s): AMMONIA in the last 168 hours. CBC: Recent Labs  Lab 09/08/19 1755 09/08/19 2052  WBC 6.6 8.8  NEUTROABS 4.0  --   HGB 17.1* 16.7  HCT 50.5 47.8  MCV 93.7 93.2  PLT 237 231   Cardiac Enzymes: No results for input(s): CKTOTAL, CKMB, CKMBINDEX, TROPONINI in the last 168 hours. BNP (last 3 results) No results for input(s): BNP in the last 8760 hours.  ProBNP (last 3 results) No results for input(s): PROBNP in the last 8760 hours.  CBG: Recent Labs  Lab 09/09/19 0722 09/09/19 1116  GLUCAP 224* 194*    Recent Results (from the past 240 hour(s))  SARS CORONAVIRUS 2 (TAT 6-24 HRS) Nasopharyngeal Nasopharyngeal Swab     Status: None   Collection Time: 09/08/19  8:00 PM   Specimen: Nasopharyngeal Swab  Result Value Ref Range Status   SARS Coronavirus 2 NEGATIVE NEGATIVE Final    Comment: (NOTE) SARS-CoV-2 target nucleic acids are NOT DETECTED. The SARS-CoV-2 RNA is generally detectable in upper and lower respiratory specimens during the acute phase of infection. Negative results do not preclude SARS-CoV-2 infection, do not rule out co-infections with other pathogens, and should not be used as the sole basis for treatment or other patient management decisions. Negative results must be combined with clinical observations, patient history, and epidemiological information. The expected result is Negative. Fact  Sheet for Patients: SugarRoll.be Fact Sheet for Healthcare Providers: https://www.woods-mathews.com/ This test is not yet approved or cleared by the [place] FDA and  has been authorized for detection and/or diagnosis of SARS-CoV-2 by FDA under an Emergency Use Authorization (EUA). This EUA will remain  in effect (meaning this test can be used) for the duration of the COVID-19 declaration under Section 56 4(b)(1)  of the Act, 21 U.S.C. section 360bbb-3(b)(1), unless the authorization is terminated or revoked sooner. Performed at [facility], [place] [address] [place],  [address]      Studies: Dg Chest Port 1 View  Result Date: 09/08/2019 CLINICAL DATA:  Pt arrives with c/o asthma since yesterday. Says he has felt somewhat SOB and wheezing, using his inhalers without relief. Mild expiratory wheezing throughout, +cough. No fevers. EXAM: PORTABLE CHEST 1 VIEW COMPARISON:  Chest radiograph 01/25/2019 FINDINGS: The heart size and mediastinal contours are within normal limits. The lungs are clear. No pneumothorax or significant pleural effusion. The visualized skeletal structures are unremarkable. IMPRESSION: No evidence of active disease in the chest. Electronically Signed   By: Audie Pinto M.D.   On: 09/08/2019 17:45    Scheduled Meds: . albuterol  2.5 mg Inhalation Q4H  . enoxaparin (LOVENOX) injection  40 mg Subcutaneous Daily  . fluticasone furoate-vilanterol  1 puff Inhalation Daily  . [START ON 09/10/2019] influenza vac split quadrivalent PF  0.5 mL Intramuscular Tomorrow-1000  . [START ON 09/10/2019] pneumococcal 23 valent vaccine  0.5 mL Intramuscular Tomorrow-1000  . predniSONE  20 mg Oral QID  . sodium chloride flush  3 mL Intravenous Q12H   Continuous Infusions: . sodium chloride      Principal Problem:   Asthma exacerbation Active Problems:   Hyperglycemia   Environmental allergies    Time spent: 35 minutes    [address] NP  [facility]  If 7PM-7AM, please contact night-coverage at [email or web address], password [facility] 09/09/2019, 12:17 PM  LOS: 0 days

## 2019-09-09 NOTE — Progress Notes (Signed)
SATURATION QUALIFICATIONS:   Patient Saturations on Room Air at Rest = 95%  Patient Saturations on Room Air while Ambulating = 97%

## 2019-09-10 LAB — GLUCOSE, CAPILLARY: Glucose-Capillary: 116 mg/dL — ABNORMAL HIGH (ref 70–99)

## 2019-09-10 MED ORDER — IPRATROPIUM-ALBUTEROL 0.5-2.5 (3) MG/3ML IN SOLN: 3.0000 mL | Freq: Four times a day (QID) | RESPIRATORY_TRACT | 1 refills | Status: AC

## 2019-09-10 MED ORDER — PREDNISONE 10 MG PO TABS: ORAL_TABLET | ORAL | 0 refills | Status: DC

## 2019-09-10 MED FILL — predniSONE 10 MG TABS: 10 | 8 days supply | Qty: 20 | Fill #0

## 2019-09-10 MED FILL — IPRAT-ALBUT 0.5-3(2.5) MG/3: 0.5-2.5 (3) | 30 days supply | Qty: 360 | Fill #0

## 2019-09-10 NOTE — Discharge Summary (Signed)
Physician Discharge Summary  Joseph Fitzgerald [id number] DOB: Jan 05, 1971 DOA: 09/08/2019  PCP: Ladell Pier, MD  Admit date: 09/08/2019 Discharge date: 09/10/2019  Time spent: 45 minutes  Recommendations for Outpatient Follow-up:  1. Follow up with PCP in 1-2 weeks for evaluation of symptoms. Consider OP PFT's and possible referral to pulmonologist   Discharge Diagnoses:  Principal Problem:   Asthma exacerbation Active Problems:   Hyperglycemia   Environmental allergies   Discharge Condition: stable  Diet recommendation: regular  Filed Weights   09/09/19 0806  Weight: 71.7 kg    History of present illness:   Joseph Fitzgerald is a 48 y.o. male with medical history significant of asthma, allergies and eczema.  He has history of prior hospitalizations for asthma flares.  He has never required intubation.  Patient reported he had been adherent to his medications including Breo daily and albuterol  metered-dose inhaler as needed.  He does not smoke cigarettes but does admit  having recently smoked THC.  For the previous 36 hours he had increasing wheezing that had not responded to his usual home regimen.  He does not use oxygen at home.  Does not have a home pulse oximeter.  Because of his continued wheezing he presented 10/11 to the [facility] emergency department for evaluation  Hospital Course:  1. Acute asthma exacerbation -patient with a longstanding history of asthma generally controlled. Presented to the emergency department because of refractory wheezing. He had  Tachypnea but no increased work of breathing or hypoxia. Received solumedrol and nebs. At discharge BS with good air movement. Oxygen saturation level greater than 90% on room air at rest and with ambulation. Will discharge with nebs and steroid taper. Recommend close follow up with PCP. May benefit from OP PFT.   2. Hyperglycemia. Likely related to solumedrol. CBG 115 at  discharge.  Procedures:    Consultations:    Discharge Exam: Vitals:   09/10/19 0115 09/10/19 0457  BP:  100/63  Pulse:  (!) 53  Resp:  20  Temp:  98 F (36.7 C)  SpO2: 95% 93%    General: awake alert sitting up in bed no acute distress Cardiovascular: rrr no mgr no LE edema Respiratory: normal effort BS clear bilaterally no wheeze  Discharge Instructions   Discharge Instructions    Call MD for:  difficulty breathing, headache or visual disturbances   Complete by: As directed    Call MD for:  persistant dizziness or light-headedness   Complete by: As directed    Call MD for:  temperature >100.4   Complete by: As directed    Diet - low sodium heart healthy   Complete by: As directed    Discharge instructions   Complete by: As directed    Take medications as prescribed. Follow up with PCP 1-2 weeks for evaluation of symptoms   Increase activity slowly   Complete by: As directed      Allergies as of 09/10/2019      Reactions   Penicillins Rash   Did it involve swelling of the face/tongue/throat, SOB, or low BP?No Did it involve sudden or severe rash/hives, skin peeling, or any reaction on the inside of your mouth or nose?Yes Did you need to seek medical attention at a hospital or doctor's office? no When did it last happen?Child If all above answers are "NO", may proceed with cephalosporin use.      Medication List    TAKE these medications   albuterol 108 (90 Base) MCG/ACT  inhaler Commonly known as: VENTOLIN HFA Inhale 2 puffs into the lungs every 4 (four) hours as needed for wheezing or shortness of breath.   fluticasone furoate-vilanterol 200-25 MCG/INH Aepb Commonly known as: Breo Ellipta Inhale 1 puff into the lungs daily.   ipratropium-albuterol 0.5-2.5 (3) MG/3ML Soln Commonly known as: DUONEB Take 3 mLs by nebulization every 6 (six) hours.   predniSONE 10 MG tablet Commonly known as: DELTASONE Take 4 tabs for 2 days take 3 tabs for 2 days  take 2 tabs for 2 days take 1 tab for 2 days then stop.      Allergies  Allergen Reactions  . Penicillins Rash    Did it involve swelling of the face/tongue/throat, SOB, or low BP?No Did it involve sudden or severe rash/hives, skin peeling, or any reaction on the inside of your mouth or nose?Yes Did you need to seek medical attention at a hospital or doctor's office? no When did it last happen?Child If all above answers are "NO", may proceed with cephalosporin use.      The results of significant diagnostics from this hospitalization (including imaging, microbiology, ancillary and laboratory) are listed below for reference.    Significant Diagnostic Studies: Dg Chest Port 1 View  Result Date: 09/08/2019 CLINICAL DATA:  Pt arrives with c/o asthma since yesterday. Says he has felt somewhat SOB and wheezing, using his inhalers without relief. Mild expiratory wheezing throughout, +cough. No fevers. EXAM: PORTABLE CHEST 1 VIEW COMPARISON:  Chest radiograph 01/25/2019 FINDINGS: The heart size and mediastinal contours are within normal limits. The lungs are clear. No pneumothorax or significant pleural effusion. The visualized skeletal structures are unremarkable. IMPRESSION: No evidence of active disease in the chest. Electronically Signed   By: Audie Pinto M.D.   On: 09/08/2019 17:45    Microbiology: Recent Results (from the past 240 hour(s))  SARS CORONAVIRUS 2 (TAT 6-24 HRS) Nasopharyngeal Nasopharyngeal Swab     Status: None   Collection Time: 09/08/19  8:00 PM   Specimen: Nasopharyngeal Swab  Result Value Ref Range Status   SARS Coronavirus 2 NEGATIVE NEGATIVE Final    Comment: (NOTE) SARS-CoV-2 target nucleic acids are NOT DETECTED. The SARS-CoV-2 RNA is generally detectable in upper and lower respiratory specimens during the acute phase of infection. Negative results do not preclude SARS-CoV-2 infection, do not rule out co-infections with other pathogens, and should not  be used as the sole basis for treatment or other patient management decisions. Negative results must be combined with clinical observations, patient history, and epidemiological information. The expected result is Negative. Fact Sheet for Patients: SugarRoll.be Fact Sheet for Healthcare Providers: https://www.woods-mathews.com/ This test is not yet approved or cleared by the [place] FDA and  has been authorized for detection and/or diagnosis of SARS-CoV-2 by FDA under an Emergency Use Authorization (EUA). This EUA will remain  in effect (meaning this test can be used) for the duration of the COVID-19 declaration under Section 56 4(b)(1) of the Act, 21 U.S.C. section 360bbb-3(b)(1), unless the authorization is terminated or revoked sooner. Performed at [facility], [place] [address], [place], [place] [address]      Labs: Basic Metabolic Panel: Recent Labs  Lab 09/08/19 1755 09/08/19 2052  NA 139  --   K 4.0  --   CL 104  --   CO2 22  --   GLUCOSE 98  --   BUN 14  --   CREATININE 0.82 0.91  CALCIUM 9.3  --  Liver Function Tests: No results for input(s): AST, ALT, ALKPHOS, BILITOT, PROT, ALBUMIN in the last 168 hours. No results for input(s): LIPASE, AMYLASE in the last 168 hours. No results for input(s): AMMONIA in the last 168 hours. CBC: Recent Labs  Lab 09/08/19 1755 09/08/19 2052  WBC 6.6 8.8  NEUTROABS 4.0  --   HGB 17.1* 16.7  HCT 50.5 47.8  MCV 93.7 93.2  PLT 237 231   Cardiac Enzymes: No results for input(s): CKTOTAL, CKMB, CKMBINDEX, TROPONINI in the last 168 hours. BNP: BNP (last 3 results) No results for input(s): BNP in the last 8760 hours.  ProBNP (last 3 results) No results for input(s): PROBNP in the last 8760 hours.  CBG: Recent Labs  Lab 09/09/19 0722 09/09/19 1116 09/09/19 1622 09/09/19 2139 09/10/19 0555  GLUCAP 224* 194* 110* 140* 116*       Signed:  Radene Gunning  NP [facility] 09/10/2019, 9:39 AM

## 2019-09-10 NOTE — Progress Notes (Signed)
Mr. Carreiro to be D/C'd Home per MD order.  Discussed with the patient and all questions fully answered.   VSS, Skin clean, dry and intact without evidence of skin break down, no evidence of skin tears noted. IV catheter discontinued intact. Site without signs and symptoms of complications. Dressing and pressure applied.   An After Visit Summary was printed and given to the patient.    D/C education completed with patient/family including follow up instructions, medication list, d/c activities limitations if indicated, with other d/c instructions as indicated by MD - patient able to verbalize understanding, all questions fully answered.    Patient instructed to return to ED, call 911, or call MD for any changes in condition.    Patient escorted via [place], and D/C home via private car.

## 2019-09-10 NOTE — Progress Notes (Signed)
Patient resting O2 Stats 95% RA. While ambulating 95-94% on RA patient stated that he did not feel short of breath or dizzy. Arthor Captain LPN

## 2019-09-10 NOTE — Discharge Instructions (Signed)
Asthma, Adult  Asthma is a long-term (chronic) condition in which the airways get tight and narrow. The airways are the breathing passages that lead from the nose and mouth down into the lungs. A person with asthma will have times when symptoms get worse. These are called asthma attacks. They can cause coughing, whistling sounds when you breathe (wheezing), shortness of breath, and chest pain. They can make it hard to breathe. There is no cure for asthma, but medicines and lifestyle changes can help control it. There are many things that can bring on an asthma attack or make asthma symptoms worse (triggers). Common triggers include:  Mold.  Dust.  Cigarette smoke.  Cockroaches.  Things that can cause allergy symptoms (allergens). These include animal skin flakes (dander) and pollen from trees or grass.  Things that pollute the air. These may include household cleaners, wood smoke, smog, or chemical odors.  Cold air, weather changes, and wind.  Crying or laughing hard.  Stress.  Certain medicines or drugs.  Certain foods such as dried fruit, potato chips, and grape juice.  Infections, such as a cold or the flu.  Certain medical conditions or diseases.  Exercise or tiring activities. Asthma may be treated with medicines and by staying away from the things that cause asthma attacks. Types of medicines may include:  Controller medicines. These help prevent asthma symptoms. They are usually taken every day.  Fast-acting reliever or rescue medicines. These quickly relieve asthma symptoms. They are used as needed and provide short-term relief.  Allergy medicines if your attacks are brought on by allergens.  Medicines to help control the body's defense (immune) system. Follow these instructions at home: Avoiding triggers in your home  Change your heating and air conditioning filter often.  Limit your use of fireplaces and wood stoves.  Get rid of pests (such as roaches and  mice) and their droppings.  Throw away plants if you see mold on them.  Clean your floors. Dust regularly. Use cleaning products that do not smell.  Have someone vacuum when you are not home. Use a vacuum cleaner with a HEPA filter if possible.  Replace carpet with wood, tile, or vinyl flooring. Carpet can trap animal skin flakes and dust.  Use allergy-proof pillows, mattress covers, and box spring covers.  Wash bed sheets and blankets every week in hot water. Dry them in a dryer.  Keep your bedroom free of any triggers.  Avoid pets and keep windows closed when things that cause allergy symptoms are in the air.  Use blankets that are made of polyester or cotton.  Clean bathrooms and kitchens with bleach. If possible, have someone repaint the walls in these rooms with mold-resistant paint. Keep out of the rooms that are being cleaned and painted.  Wash your hands often with soap and water. If soap and water are not available, use hand sanitizer.  Do not allow anyone to smoke in your home. General instructions  Take over-the-counter and prescription medicines only as told by your doctor. ? Talk with your doctor if you have questions about how or when to take your medicines. ? Make note if you need to use your medicines more often than usual.  Do not use any products that contain nicotine or tobacco, such as cigarettes and e-cigarettes. If you need help quitting, ask your doctor.  Stay away from secondhand smoke.  Avoid doing things outdoors when allergen counts are high and when air quality is low.  Wear a ski mask  when doing outdoor activities in the winter. The mask should cover your nose and mouth. Exercise indoors on cold days if you can.  Warm up before you exercise. Take time to cool down after exercise.  Use a peak flow meter as told by your doctor. A peak flow meter is a tool that measures how well the lungs are working.  Keep track of the peak flow meter's readings.  Write them down.  Follow your asthma action plan. This is a written plan for taking care of your asthma and treating your attacks.  Make sure you get all the shots (vaccines) that your doctor recommends. Ask your doctor about a flu shot and a pneumonia shot.  Keep all follow-up visits as told by your doctor. This is important. Contact a doctor if:  You have wheezing, shortness of breath, or a cough even while taking medicine to prevent attacks.  The mucus you cough up (sputum) is thicker than usual.  The mucus you cough up changes from clear or white to yellow, green, gray, or bloody.  You have problems from the medicine you are taking, such as: ? A rash. ? Itching. ? Swelling. ? Trouble breathing.  You need reliever medicines more than 2-3 times a week.  Your peak flow reading is still at 50-79% of your personal best after following the action plan for 1 hour.  You have a fever. Get help right away if:  You seem to be worse and are not responding to medicine during an asthma attack.  You are short of breath even at rest.  You get short of breath when doing very little activity.  You have trouble eating, drinking, or talking.  You have chest pain or tightness.  You have a fast heartbeat.  Your lips or fingernails start to turn blue.  You are light-headed or dizzy, or you faint.  Your peak flow is less than 50% of your personal best.  You feel too tired to breathe normally. Summary  Asthma is a long-term (chronic) condition in which the airways get tight and narrow. An asthma attack can make it hard to breathe.  Asthma cannot be cured, but medicines and lifestyle changes can help control it.  Make sure you understand how to avoid triggers and how and when to use your medicines. This information is not intended to replace advice given to you by your health care provider. Make sure you discuss any questions you have with your health care provider. Document  Released: 05/02/2008 Document Revised: 01/17/2019 Document Reviewed: 12/19/2016 Elsevier Patient Education  2020 [employer].

## 2019-09-10 NOTE — Plan of Care (Signed)
  Problem: Clinical Measurements: Goal: Respiratory complications will improve Outcome: Progressing   Problem: Safety: Goal: Ability to remain free from injury will improve Outcome: Progressing   

## 2019-10-03 ENCOUNTER — Inpatient Hospital Stay: Payer: 59 | Admitting: Internal Medicine

## 2019-10-11 ENCOUNTER — Inpatient Hospital Stay: Payer: 59 | Admitting: Internal Medicine

## 2019-10-17 ENCOUNTER — Other Ambulatory Visit: Payer: Self-pay

## 2019-10-17 ENCOUNTER — Encounter: Payer: Self-pay | Admitting: Internal Medicine

## 2019-10-17 ENCOUNTER — Ambulatory Visit: Payer: 59 | Attending: Internal Medicine | Admitting: Internal Medicine

## 2019-10-17 VITALS — BP 117/77 | HR 74 | Temp 98.3°F | Resp 16 | Wt 168.6 lb

## 2019-10-17 DIAGNOSIS — J4540 Moderate persistent asthma, uncomplicated: Secondary | ICD-10-CM | POA: Diagnosis not present

## 2019-10-17 DIAGNOSIS — R739 Hyperglycemia, unspecified: Secondary | ICD-10-CM | POA: Diagnosis not present

## 2019-10-17 DIAGNOSIS — T50905A Adverse effect of unspecified drugs, medicaments and biological substances, initial encounter: Secondary | ICD-10-CM

## 2019-10-17 LAB — POCT GLYCOSYLATED HEMOGLOBIN (HGB A1C): HbA1c POC (<> result, manual entry): 5.4 % (ref 4.0–5.6)

## 2019-10-17 MED ORDER — BREO ELLIPTA 200-25 MCG/INH IN AEPB: 1.0000 | INHALATION_SPRAY | Freq: Every day | RESPIRATORY_TRACT | 6 refills | Status: DC

## 2019-10-17 MED FILL — BREO ELLIPTA 200-25 MCG INH: 200-25 | 30 days supply | Qty: 60 | Fill #0

## 2019-10-17 NOTE — Progress Notes (Signed)
Patient ID: Joseph Fitzgerald, male    DOB: 06-30-1971  MRN: [id number]  CC: Hospitalization Follow-up   Subjective: Joseph Fitzgerald is a 48 y.o. male who presents for hosp f/u His concerns today include:  Pt with mod persistent asthma, obesity, environmental allergiesand eczema.   Patient hospitalized 10/11-13/2020 with asthma exacerbation.  He improved with standard treatment and without having to be intubated.  He was discharged home on inhalers which she were on before-Breo and Ventolin.  He did have some elevated blood sugars while in the hospital likely due to Solu-Medrol.  Patient tells me that that hospitalization was due to the fact that he was out of town for 1/2 weeks and did not have his Breo inhaler with him and his breathing got worse because of it.  Since discharge he reports that his breathing is back to baseline.  He uses Ventolin inhaler about twice a week.  He is using the [facility] as prescribed.  Last time he used nebulizer was at the beginning of this month.  He has never had PFTs. Patient Active Problem List   Diagnosis Date Noted  . Asthma exacerbation 09/08/2019  . Acute asthma exacerbation 01/25/2019  . Hypokalemia 03/17/2018  . Hyperglycemia 03/17/2018  . Asthma, moderate persistent 08/17/2016  . Vitamin D deficiency 09/01/2015  . Family history of diabetes mellitus in brother 08/31/2015  . Eczema 08/31/2015  . Environmental allergies 04/16/2014  . Acute respiratory failure with hypoxia ([place]) 06/12/2013     Current Outpatient Medications on File Prior to Visit  Medication Sig Dispense Refill  . albuterol (VENTOLIN HFA) 108 (90 Base) MCG/ACT inhaler Inhale 2 puffs into the lungs every 4 (four) hours as needed for wheezing or shortness of breath. 1 Inhaler 0  . ipratropium-albuterol (DUONEB) 0.5-2.5 (3) MG/3ML SOLN Take 3 mLs by nebulization every 6 (six) hours. 360 mL 1   No current facility-administered medications on file prior to visit.     Allergies  Allergen  Reactions  . Penicillins Rash    Did it involve swelling of the face/tongue/throat, SOB, or low BP?No Did it involve sudden or severe rash/hives, skin peeling, or any reaction on the inside of your mouth or nose?Yes Did you need to seek medical attention at a hospital or doctor's office? no When did it last happen?Child If all above answers are "NO", may proceed with cephalosporin use.    Social History   Socioeconomic History  . Marital status: Single    Spouse name: Not on file  . Number of children: 3  . Years of education: 31  . Highest education level: Not on file  Occupational History  . Occupation: [employer]  . Financial resource strain: Not on file  . Food insecurity    Worry: Not on file    Inability: Not on file  . Transportation needs    Medical: Not on file    Non-medical: Not on file  Tobacco Use  . Smoking status: Never Smoker  . Smokeless tobacco: Current User    Types: Chew  Substance and Sexual Activity  . Alcohol use: Yes    Comment: beer  . Drug use: No  . Sexual activity: Yes  Lifestyle  . Physical activity    Days per week: 0 days    Minutes per session: 0 min  . Stress: Not at all  Relationships  . Social Herbalist on phone: Twice a week    Gets together: Twice a  week    Attends religious service: More than 4 times per year    Active member of club or organization: No    Attends meetings of clubs or organizations: Never    Relationship status: Married  . Intimate partner violence    Fear of current or ex partner: No    Emotionally abused: No    Physically abused: No    Forced sexual activity: No  Other Topics Concern  . Not on file  Social History Narrative  . Not on file    Family History  Problem Relation Age of Onset  . Asthma Brother   . Diabetes Brother     Past Surgical History:  Procedure Laterality Date  . NO PAST SURGERIES      ROS: Review of Systems Negative except as stated above   PHYSICAL EXAM: BP 117/77   Pulse 74   Temp 98.3 F (36.8 C) (Oral)   Resp 16   Wt 168 lb 9.6 oz (76.5 kg)   SpO2 98%   BMI 29.87 kg/m   Physical Exam   General appearance - alert, well appearing, and in no distress Mental status - normal mood, behavior, speech, dress, motor activity, and thought processes Eyes -hypopigmented shininess around the eyes Neck - supple, no significant adenopathy Chest -breath sounds slightly decreased bilaterally but no wheezes heard at this time. Heart - normal rate, regular rhythm, normal S1, S2, no murmurs, rubs, clicks or gallops Extremities - peripheral pulses normal, no pedal edema, no clubbing or cyanosis  CMP Latest Ref Rng & Units 09/08/2019 09/08/2019 01/26/2019  Glucose 70 - 99 mg/dL - 98 162(H)  BUN 6 - 20 mg/dL - 14 10  Creatinine 0.61 - 1.24 mg/dL 0.91 0.82 0.87  Sodium 135 - 145 mmol/L - 139 138  Potassium 3.5 - 5.1 mmol/L - 4.0 4.0  Chloride 98 - 111 mmol/L - 104 105  CO2 22 - 32 mmol/L - 22 25  Calcium 8.9 - 10.3 mg/dL - 9.3 8.6(L)  Total Protein 6.0 - 8.5 g/dL - - -  Total Bilirubin 0.0 - 1.2 mg/dL - - -  Alkaline Phos 39 - 117 IU/L - - -  AST 0 - 40 IU/L - - -  ALT 0 - 44 IU/L - - -   Lipid Panel     Component Value Date/Time   CHOL 196 01/04/2018 1540   TRIG 192 (H) 01/04/2018 1540   HDL 44 01/04/2018 1540   CHOLHDL 4.5 01/04/2018 1540   CHOLHDL 3.9 08/18/2014 0920   VLDL 18 08/18/2014 0920   LDLCALC 114 (H) 01/04/2018 1540    CBC    Component Value Date/Time   WBC 8.8 09/08/2019 2052   RBC 5.13 09/08/2019 2052   HGB 16.7 09/08/2019 2052   HGB 15.4 01/04/2018 1540   HCT 47.8 09/08/2019 2052   HCT 45.4 01/04/2018 1540   PLT 231 09/08/2019 2052   PLT 207 01/04/2018 1540   MCV 93.2 09/08/2019 2052   MCV 92 01/04/2018 1540   MCH 32.6 09/08/2019 2052   MCHC 34.9 09/08/2019 2052   RDW 12.0 09/08/2019 2052   RDW 13.8 01/04/2018 1540   LYMPHSABS 1.3 09/08/2019 1755   MONOABS 0.5 09/08/2019 1755   EOSABS 0.8  (H) 09/08/2019 1755   BASOSABS 0.1 09/08/2019 1755   Results for orders placed or performed in visit on 10/17/19  HgB A1c  Result Value Ref Range   Hemoglobin A1C     HbA1c POC (<> result, manual  entry) 5.4 4.0 - 5.6 %   HbA1c, POC (prediabetic range)     HbA1c, POC (controlled diabetic range)       ASSESSMENT AND PLAN: 1. Moderate persistent asthma without complication Continue Breo.  Continue Ventolin as needed.  We will get pulmonary function test.  We will get him in with Dr. Joya Gaskins - Pulmonary function test; Future - fluticasone furoate-vilanterol (BREO ELLIPTA) 200-25 MCG/INH AEPB; Inhale 1 puff into the lungs daily.  Dispense: 60 each; Refill: 6  2. Drug-induced hyperglycemia A1c is in the normal range. - HgB A1c    Patient was given the opportunity to ask questions.  Patient verbalized understanding of the plan and was able to repeat key elements of the plan.   Orders Placed This Encounter  Procedures  . HgB A1c  . Pulmonary function test     Requested Prescriptions   Signed Prescriptions Disp Refills  . fluticasone furoate-vilanterol (BREO ELLIPTA) 200-25 MCG/INH AEPB 60 each 6    Sig: Inhale 1 puff into the lungs daily.    Return in about 3 months (around 01/17/2020).  Karle Plumber, MD, FACP

## 2019-10-17 NOTE — Patient Instructions (Addendum)
Please give patient an appointment with Dr. Joya Gaskins in about 4 weeks for asthma.  You should purchase Claritin over-the-counter and take 1 daily.

## 2019-10-30 MED FILL — BREO ELLIPTA 200-25 MCG INH: 200-25 | 30 days supply | Qty: 60 | Fill #0

## 2019-11-19 ENCOUNTER — Encounter: Payer: Self-pay | Admitting: Critical Care Medicine

## 2019-11-19 ENCOUNTER — Ambulatory Visit: Payer: 59 | Attending: Critical Care Medicine | Admitting: Critical Care Medicine

## 2019-11-19 ENCOUNTER — Other Ambulatory Visit: Payer: Self-pay

## 2019-11-19 DIAGNOSIS — J4540 Moderate persistent asthma, uncomplicated: Secondary | ICD-10-CM

## 2019-11-19 DIAGNOSIS — Z9109 Other allergy status, other than to drugs and biological substances: Secondary | ICD-10-CM

## 2019-11-19 MED ORDER — ALBUTEROL SULFATE HFA 108 (90 BASE) MCG/ACT IN AERS: 2.0000 | INHALATION_SPRAY | RESPIRATORY_TRACT | 0 refills | Status: DC | PRN

## 2019-11-19 MED ORDER — FLUTICASONE PROPIONATE 50 MCG/ACT NA SUSP: 2.0000 | Freq: Every day | NASAL | 3 refills | Status: DC

## 2019-11-19 MED ORDER — ALBUTEROL SULFATE HFA 108 (90 BASE) MCG/ACT IN AERS: 2.0000 | INHALATION_SPRAY | RESPIRATORY_TRACT | Status: DC | PRN

## 2019-11-19 MED FILL — FLUTICASONE PROP 50 MCG SPR: 50 | 30 days supply | Qty: 16 | Fill #0

## 2019-11-19 MED FILL — VENTOLIN HFA 90 MCG INHALER: 108 (90 BAS | 16 days supply | Qty: 18 | Fill #0

## 2019-11-19 NOTE — Progress Notes (Signed)
Patient ID: Joseph Fitzgerald, male   DOB: Apr 02, 1971, 48 y.o.   MRN: [id number] Virtual Visit via Telephone Note  I connected with Joseph Fitzgerald on 11/19/19 at  2:00 PM EST by telephone and verified that I am speaking with the correct person using two identifiers.   Consent:  I discussed the limitations, risks, security and privacy concerns of performing an evaluation and management service by telephone and the availability of in person appointments. I also discussed with the patient that there may be a patient responsible charge related to this service. The patient expressed understanding and agreed to proceed.  Location of patient: The patient was at home  Location of provider: I was in my office  Persons participating in the televisit with the patient.   No one else on the call    History of Present Illness: This is a [age] year old male referred by primary care for asthma evaluation.  Note I previously seen this patient in 2017 and 2018.  The patient's had moderate persistent asthma with significant atopic features eczema and allergic rhinitis.  The patient lives in a clean home environment.  He has occasional wheezing and it early in the morning his nose will be stopped up and he is not been compliant with the Flonase.  He actually is better on the [facility] after having an admission for asthma in October.  He had to receive prednisone pulse during that timeframe since that time he is off prednisone at this time.  The admission during October was reviewed Hx asthma  Last OV with me 2018    Constitutional:   No  weight loss, night sweats,  Fevers, chills, fatigue, lassitude. HEENT:   No headaches,  Difficulty swallowing,  Tooth/dental problems,  Sore throat,                No sneezing, itching, ear ache, nasal congestion, post nasal drip, some ear congestion  CV:  No chest pain,  Orthopnea, PND, swelling in lower extremities, anasarca, dizziness, palpitations  GI  No heartburn, indigestion,  abdominal pain, nausea, vomiting, diarrhea, change in bowel habits, loss of appetite  Resp:  shortness of breath with exertion not at rest.  No excess mucus, no productive cough,  No non-productive cough,  No coughing up of blood.  No change in color of mucus.   wheezing.  No chest wall deformity  Skin: no rash or lesions.  GU: no dysuria, change in color of urine, no urgency or frequency.  No flank pain.  MS:  No joint pain or swelling.  No decreased range of motion.  No back pain.  Psych:  No change in mood or affect. No depression or anxiety.  No memory loss.  Observations/Objective: No observations this is a telephone visit  Assessment and Plan: #1 moderate persistent asthma without acute exacerbation at this time  Plan to be for the patient to use Flonase on a regular basis 2 sprays each nostril daily, the patient will continue Breo 1 puff daily 200 mcg strength and as needed albuterol  I did go over the inhaler technique with the patient and he apparently had a poor technique after the instruction of the phone using teach back I confirmed the patient had a better understanding how to properly use DPI inhalers    Follow Up Instructions: The patient will have an in office exam in January   I discussed the assessment and treatment plan with the patient. The patient was provided an opportunity to ask questions  and all were answered. The patient agreed with the plan and demonstrated an understanding of the instructions.   The patient was advised to call back or seek an in-person evaluation if the symptoms worsen or if the condition fails to improve as anticipated.  I provided 30 minutes of non-face-to-face time during this encounter  including  median intraservice time , review of notes, labs, imaging, medications  and explaining diagnosis and management to the patient .    Asencion Noble, MD

## 2019-11-19 NOTE — Progress Notes (Signed)
Patient verified DOB Patient has taken medication today. Patient has eaten today. Patient denies pain at this time.

## 2019-11-25 MED FILL — BREO ELLIPTA 200-25 MCG INH: 200-25 | 30 days supply | Qty: 60 | Fill #1

## 2019-12-18 ENCOUNTER — Ambulatory Visit: Payer: 59 | Admitting: Critical Care Medicine

## 2019-12-18 NOTE — Progress Notes (Deleted)
   Subjective:    Patient ID: Joseph Fitzgerald, male    DOB: 1970/12/17, 49 y.o.   MRN: [id number]  This is a [age] year old male referred by primary care for asthma evaluation.  Note I previously seen this patient in 2017 and 2018.  The patient's had moderate persistent asthma with significant atopic features eczema and allergic rhinitis.  The patient lives in a clean home environment.  He has occasional wheezing and it early in the morning his nose will be stopped up and he is not been compliant with the Flonase.  He actually is better on the [facility] after having an admission for asthma in October.  He had to receive prednisone pulse during that timeframe since that time he is off prednisone at this time.  The admission during October was reviewed Hx asthma  Last OV with me 2018     12/18/2019 At last OV 12/22: #1 moderate persistent asthma without acute exacerbation at this time  Plan to be for the patient to use Flonase on a regular basis 2 sprays each nostril daily, the patient will continue Breo 1 puff daily 200 mcg strength and as needed albuterol  I did go over the inhaler technique with the patient and he apparently had a poor technique after the instruction of the phone using teach back I confirmed the patient had a better understanding how to properly use DPI inhalers       Review of Systems     Objective:   Physical Exam        Assessment & Plan:

## 2020-01-02 MED FILL — BREO ELLIPTA 200-25 MCG INH: 200-25 | 30 days supply | Qty: 60 | Fill #2

## 2020-01-17 ENCOUNTER — Ambulatory Visit: Payer: MEDICAID | Attending: Internal Medicine | Admitting: Internal Medicine

## 2020-01-17 DIAGNOSIS — J4540 Moderate persistent asthma, uncomplicated: Secondary | ICD-10-CM

## 2020-01-17 NOTE — Progress Notes (Signed)
Virtual Visit via Telephone Note Due to current restrictions/limitations of in-office visits due to the COVID-19 pandemic, this scheduled clinical appointment was converted to a telehealth visit  I connected with Joseph Fitzgerald on 01/17/20 at 1:22 p.m by telephone and verified that I am speaking with the correct person using two identifiers. I am in my office.  The patient is at home.  Only the patient and myself participated in this encounter.  I discussed the limitations, risks, security and privacy concerns of performing an evaluation and management service by telephone and the availability of in person appointments. I also discussed with the patient that there may be a patient responsible charge related to this service. The patient expressed understanding and agreed to proceed.   History of Present Illness: Pt with mod persistent asthma, obesity, environmental allergiesand eczema.  Patient last evaluated by me in 09/2019. Purpose of today's visit is chronic disease management.  Asthma: Since last visit with me he had a telephone visit with Dr. Joya Gaskins. He recommend that patient use the Flonase on a regular basis and continue Breo. -pt reports he did well since last visit with me.  Had small flare 2 wks ago but did not require ER visit.  -reports compliance with Breo.  Used neb machine once since he last saw me.    Outpatient Encounter Medications as of 01/17/2020  Medication Sig  . albuterol (VENTOLIN HFA) 108 (90 Base) MCG/ACT inhaler Inhale 2 puffs into the lungs every 4 (four) hours as needed for wheezing or shortness of breath.  . fluticasone (FLONASE) 50 MCG/ACT nasal spray Place 2 sprays into both nostrils daily.  . fluticasone furoate-vilanterol (BREO ELLIPTA) 200-25 MCG/INH AEPB Inhale 1 puff into the lungs daily.  Marland Kitchen ipratropium-albuterol (DUONEB) 0.5-2.5 (3) MG/3ML SOLN Take 3 mLs by nebulization every 6 (six) hours.   No facility-administered encounter medications on file as of  01/17/2020.      Observations/Objective:   Chemistry      Component Value Date/Time   NA 139 09/08/2019 1755   NA 145 (H) 01/04/2018 1540   K 4.0 09/08/2019 1755   CL 104 09/08/2019 1755   CO2 22 09/08/2019 1755   BUN 14 09/08/2019 1755   BUN 10 01/04/2018 1540   CREATININE 0.91 09/08/2019 2052   CREATININE 0.97 01/22/2015 1009      Component Value Date/Time   CALCIUM 9.3 09/08/2019 1755   ALKPHOS 119 (H) 01/04/2018 1540   AST 19 01/04/2018 1540   ALT 20 01/04/2018 1540   BILITOT 0.3 01/04/2018 1540     Lab Results  Component Value Date   WBC 8.8 09/08/2019   HGB 16.7 09/08/2019   HCT 47.8 09/08/2019   MCV 93.2 09/08/2019   PLT 231 09/08/2019     Assessment and Plan: 1. Moderate persistent asthma without complication Patient has done fairly well since last visit.  He will continue with the [facility] as prescribed and use the albuterol inhaler as needed. Advised to follow-up with [place] if he has any flareups before next visit.  Otherwise we will plan to see him back in 3 to 4 months.   Follow Up Instructions: 3-4 mth   I discussed the assessment and treatment plan with the patient. The patient was provided an opportunity to ask questions and all were answered. The patient agreed with the plan and demonstrated an understanding of the instructions.   The patient was advised to call back or seek an in-person evaluation if the symptoms worsen or if the  condition fails to improve as anticipated.  I provided 5 minutes of non-face-to-face time during this encounter.   Karle Plumber, MD

## 2020-02-03 MED FILL — BREO ELLIPTA 200-25 MCG INH: 200-25 | 30 days supply | Qty: 60 | Fill #3

## 2020-03-04 MED FILL — BREO ELLIPTA 200-25 MCG INH: 200-25 | 30 days supply | Qty: 60 | Fill #4

## 2020-05-11 MED FILL — BREO ELLIPTA 200-25 MCG INH: 200-25 | 30 days supply | Qty: 60 | Fill #6

## 2020-05-18 ENCOUNTER — Ambulatory Visit: Payer: 59 | Admitting: Internal Medicine

## 2020-06-18 ENCOUNTER — Other Ambulatory Visit: Payer: Self-pay | Admitting: Internal Medicine

## 2020-06-18 DIAGNOSIS — J4540 Moderate persistent asthma, uncomplicated: Secondary | ICD-10-CM

## 2020-06-18 NOTE — Telephone Encounter (Signed)
Medication Refill - Medication: fluticasone furoate-vilanterol (BREO ELLIPTA) 200-25 MCG/INH AEPB    Has the patient contacted their pharmacy? yes (Agent: If no, request that the patient contact the pharmacy for the refill.) (Agent: If yes, when and what did the pharmacy advise?)Contact PCP  Preferred Pharmacy (with phone number or street name):  [place], [address] Phone:  [phone]  Fax:  [phone]       Agent: Please be advised that RX refills may take up to 3 business days. We ask that you follow-up with your pharmacy.

## 2020-06-19 ENCOUNTER — Other Ambulatory Visit: Payer: Self-pay | Admitting: Internal Medicine

## 2020-06-19 DIAGNOSIS — J4540 Moderate persistent asthma, uncomplicated: Secondary | ICD-10-CM

## 2020-06-19 MED ORDER — BREO ELLIPTA 200-25 MCG/INH IN AEPB: 1.0000 | INHALATION_SPRAY | Freq: Every day | RESPIRATORY_TRACT | 0 refills | Status: DC

## 2020-06-19 NOTE — Telephone Encounter (Signed)
Pt has made appt for 8/20.  Pt states he was told the  fluticasone furoate-vilanterol (BREO ELLIPTA) 200-25 MCG/INH AEPB  Would be sent in if he made appt. But it was not.  Pt hoping to get today.   [place], [address] [employer] Phone:  [phone]  Fax:  [phone]

## 2020-06-19 NOTE — Telephone Encounter (Signed)
Per pharmacy patient last fill was 05/11/2020. 60 is only a one month supply. Will send in one month

## 2020-07-17 ENCOUNTER — Ambulatory Visit: Payer: MEDICAID | Attending: Internal Medicine | Admitting: Family

## 2020-07-17 ENCOUNTER — Other Ambulatory Visit: Payer: Self-pay

## 2020-07-17 ENCOUNTER — Encounter: Payer: Self-pay | Admitting: Family

## 2020-07-17 VITALS — BP 125/79 | HR 76 | Temp 98.6°F | Resp 16 | Wt 165.2 lb

## 2020-07-17 DIAGNOSIS — J4540 Moderate persistent asthma, uncomplicated: Secondary | ICD-10-CM | POA: Diagnosis not present

## 2020-07-17 MED ORDER — ALBUTEROL SULFATE HFA 108 (90 BASE) MCG/ACT IN AERS: 2.0000 | INHALATION_SPRAY | RESPIRATORY_TRACT | 0 refills | Status: DC | PRN

## 2020-07-17 MED ORDER — FLUTICASONE PROPIONATE 50 MCG/ACT NA SUSP: 2.0000 | Freq: Every day | NASAL | 1 refills | Status: DC

## 2020-07-17 MED ORDER — BREO ELLIPTA 200-25 MCG/INH IN AEPB: 1.0000 | INHALATION_SPRAY | Freq: Every day | RESPIRATORY_TRACT | 0 refills | Status: DC

## 2020-07-17 NOTE — Patient Instructions (Addendum)
Continue Breo, Flonase, Albuterol, and nebulizer as prescribed. Follow-up with primary physician in 3 months or sooner if needed. Asthma, Adult  Asthma is a long-term (chronic) condition in which the airways get tight and narrow. The airways are the breathing passages that lead from the nose and mouth down into the lungs. A person with asthma will have times when symptoms get worse. These are called asthma attacks. They can cause coughing, whistling sounds when you breathe (wheezing), shortness of breath, and chest pain. They can make it hard to breathe. There is no cure for asthma, but medicines and lifestyle changes can help control it. There are many things that can bring on an asthma attack or make asthma symptoms worse (triggers). Common triggers include:  Mold.  Dust.  Cigarette smoke.  Cockroaches.  Things that can cause allergy symptoms (allergens). These include animal skin flakes (dander) and pollen from trees or grass.  Things that pollute the air. These may include household cleaners, wood smoke, smog, or chemical odors.  Cold air, weather changes, and wind.  Crying or laughing hard.  Stress.  Certain medicines or drugs.  Certain foods such as dried fruit, potato chips, and grape juice.  Infections, such as a cold or the flu.  Certain medical conditions or diseases.  Exercise or tiring activities. Asthma may be treated with medicines and by staying away from the things that cause asthma attacks. Types of medicines may include:  Controller medicines. These help prevent asthma symptoms. They are usually taken every day.  Fast-acting reliever or rescue medicines. These quickly relieve asthma symptoms. They are used as needed and provide short-term relief.  Allergy medicines if your attacks are brought on by allergens.  Medicines to help control the body's defense (immune) system. Follow these instructions at home: Avoiding triggers in your home  Change your  heating and air conditioning filter often.  Limit your use of fireplaces and wood stoves.  Get rid of pests (such as roaches and mice) and their droppings.  Throw away plants if you see mold on them.  Clean your floors. Dust regularly. Use cleaning products that do not smell.  Have someone vacuum when you are not home. Use a vacuum cleaner with a HEPA filter if possible.  Replace carpet with wood, tile, or vinyl flooring. Carpet can trap animal skin flakes and dust.  Use allergy-proof pillows, mattress covers, and box spring covers.  Wash bed sheets and blankets every week in hot water. Dry them in a dryer.  Keep your bedroom free of any triggers.  Avoid pets and keep windows closed when things that cause allergy symptoms are in the air.  Use blankets that are made of polyester or cotton.  Clean bathrooms and kitchens with bleach. If possible, have someone repaint the walls in these rooms with mold-resistant paint. Keep out of the rooms that are being cleaned and painted.  Wash your hands often with soap and water. If soap and water are not available, use hand sanitizer.  Do not allow anyone to smoke in your home. General instructions  Take over-the-counter and prescription medicines only as told by your doctor. ? Talk with your doctor if you have questions about how or when to take your medicines. ? Make note if you need to use your medicines more often than usual.  Do not use any products that contain nicotine or tobacco, such as cigarettes and e-cigarettes. If you need help quitting, ask your doctor.  Stay away from secondhand smoke.  Avoid  doing things outdoors when allergen counts are high and when air quality is low.  Wear a ski mask when doing outdoor activities in the winter. The mask should cover your nose and mouth. Exercise indoors on cold days if you can.  Warm up before you exercise. Take time to cool down after exercise.  Use a peak flow meter as told by  your doctor. A peak flow meter is a tool that measures how well the lungs are working.  Keep track of the peak flow meter's readings. Write them down.  Follow your asthma action plan. This is a written plan for taking care of your asthma and treating your attacks.  Make sure you get all the shots (vaccines) that your doctor recommends. Ask your doctor about a flu shot and a pneumonia shot.  Keep all follow-up visits as told by your doctor. This is important. Contact a doctor if:  You have wheezing, shortness of breath, or a cough even while taking medicine to prevent attacks.  The mucus you cough up (sputum) is thicker than usual.  The mucus you cough up changes from clear or white to yellow, green, gray, or bloody.  You have problems from the medicine you are taking, such as: ? A rash. ? Itching. ? Swelling. ? Trouble breathing.  You need reliever medicines more than 2-3 times a week.  Your peak flow reading is still at 50-79% of your personal best after following the action plan for 1 hour.  You have a fever. Get help right away if:  You seem to be worse and are not responding to medicine during an asthma attack.  You are short of breath even at rest.  You get short of breath when doing very little activity.  You have trouble eating, drinking, or talking.  You have chest pain or tightness.  You have a fast heartbeat.  Your lips or fingernails start to turn blue.  You are light-headed or dizzy, or you faint.  Your peak flow is less than 50% of your personal best.  You feel too tired to breathe normally. Summary  Asthma is a long-term (chronic) condition in which the airways get tight and narrow. An asthma attack can make it hard to breathe.  Asthma cannot be cured, but medicines and lifestyle changes can help control it.  Make sure you understand how to avoid triggers and how and when to use your medicines. This information is not intended to replace advice  given to you by your health care provider. Make sure you discuss any questions you have with your health care provider. Document Revised: 01/17/2019 Document Reviewed: 12/19/2016 Elsevier Patient Education  2020 [employer].

## 2020-07-17 NOTE — Progress Notes (Signed)
Patient ID: Joseph Fitzgerald, male    DOB: Apr 10, 1971  MRN: [id number]  CC: Asthma Follow-Up  Subjective: Joseph Fitzgerald is a 49 y.o. male with history of asthma moderate persistent, eczema, environmental allergies, vitamin D deficiency, and anemia who presents for asthma follow-up.  1. ASTHMA FOLLOW-UP: Last visit 11/19/2019 with Dr. Joya Gaskins. During that encounter plan to use Flonase on regular basis 2 sprays each nostril daily, continue Breo 1 puff daily, and Albuterol as needed. Went over inhaler technique using teach back and confirmed patient had proper understanding how to properly use DPI inhalers.   Last visit 01/17/2020 with Dr. Wynetta Emery. During that encounter patient previously seen by Dr. Joya Gaskins with recommendations to use Flonase on a regular basis and continue Breo. Reported compliance with Breo. Used nebulizer machine once since last visit. Continue as needed Albuterol.  Today 07/17/2020: Asthma status: controlled Satisfied with current treatment?: yes Albuterol/rescue inhaler frequency: 1 month ago while playing during an activity Dyspnea frequency: denies Wheezing frequency: denies Cough frequency: denies Nocturnal symptom frequency: denies Limitation of activity: no Current upper respiratory symptoms: no Triggers: allergies Visits to ER or Urgent Care since last visit: no Reports he has not used nebulizer since last visit.  Patient Active Problem List   Diagnosis Date Noted  . Hypokalemia 03/17/2018  . Asthma, moderate persistent 08/17/2016  . Vitamin D deficiency 09/01/2015  . Family history of diabetes mellitus in brother 08/31/2015  . Eczema 08/31/2015  . Environmental allergies 04/16/2014     Current Outpatient Medications on File Prior to Visit  Medication Sig Dispense Refill  . albuterol (VENTOLIN HFA) 108 (90 Base) MCG/ACT inhaler Inhale 2 puffs into the lungs every 4 (four) hours as needed for wheezing or shortness of breath. 18 g 0  . fluticasone (FLONASE) 50  MCG/ACT nasal spray Place 2 sprays into both nostrils daily. 16 g 3  . fluticasone furoate-vilanterol (BREO ELLIPTA) 200-25 MCG/INH AEPB Inhale 1 puff into the lungs daily. 60 each 0  . ipratropium-albuterol (DUONEB) 0.5-2.5 (3) MG/3ML SOLN Take 3 mLs by nebulization every 6 (six) hours. 360 mL 1   No current facility-administered medications on file prior to visit.    Allergies  Allergen Reactions  . Penicillins Rash    Did it involve swelling of the face/tongue/throat, SOB, or low BP?No Did it involve sudden or severe rash/hives, skin peeling, or any reaction on the inside of your mouth or nose?Yes Did you need to seek medical attention at a hospital or doctor's office? no When did it last happen?Child If all above answers are "NO", may proceed with cephalosporin use.    Social History   Socioeconomic History  . Marital status: Single    Spouse name: Not on file  . Number of children: 3  . Years of education: 25  . Highest education level: Not on file  Occupational History  . Occupation: Cook   Tobacco Use  . Smoking status: Never Smoker  . Smokeless tobacco: Current User    Types: Chew  Vaping Use  . Vaping Use: Never used  Substance and Sexual Activity  . Alcohol use: Yes    Comment: beer  . Drug use: No  . Sexual activity: Yes  Other Topics Concern  . Not on file  Social History Narrative  . Not on file   Social Determinants of Health   Financial Resource Strain:   . Difficulty of Paying Living Expenses: Not on file  Food Insecurity:   . Worried About Estate manager/land agent  of Food in the Last Year: Not on file  . Ran Out of Food in the Last Year: Not on file  Transportation Needs:   . Lack of Transportation (Medical): Not on file  . Lack of Transportation (Non-Medical): Not on file  Physical Activity:   . Days of Exercise per Week: Not on file  . Minutes of Exercise per Session: Not on file  Stress:   . Feeling of Stress : Not on file  Social Connections:   .  Frequency of Communication with Friends and Family: Not on file  . Frequency of Social Gatherings with Friends and Family: Not on file  . Attends Religious Services: Not on file  . Active Member of Clubs or Organizations: Not on file  . Attends Archivist Meetings: Not on file  . Marital Status: Not on file  Intimate Partner Violence:   . Fear of Current or Ex-Partner: Not on file  . Emotionally Abused: Not on file  . Physically Abused: Not on file  . Sexually Abused: Not on file    Family History  Problem Relation Age of Onset  . Asthma Brother   . Diabetes Brother     Past Surgical History:  Procedure Laterality Date  . NO PAST SURGERIES      ROS: Review of Systems Negative except as stated above  PHYSICAL EXAM: Vitals with BMI 07/17/2020 10/17/2019 09/10/2019  Height - - -  Weight 165 lbs 3 oz 168 lbs 10 oz -  BMI - - -  Systolic 540 086 761  Diastolic 79 77 81  Pulse 76 74 57   Wt Readings from Last 3 Encounters:  07/17/20 165 lb 3.2 oz (74.9 kg)  10/17/19 168 lb 9.6 oz (76.5 kg)  09/09/19 158 lb 1.6 oz (71.7 kg)    Physical Exam General appearance - alert, well appearing, and in no distress and oriented to person, place, and time Mental status - alert, oriented to person, place, and time, normal mood, behavior, speech, dress, motor activity, and thought processes Neck - supple, no significant adenopathy Lymphatics - no palpable lymphadenopathy, no hepatosplenomegaly Chest - clear to auscultation, no wheezes, rales or rhonchi, symmetric air entry, no tachypnea, retractions or cyanosis Heart - normal rate, regular rhythm, normal S1, S2, no murmurs, rubs, clicks or gallops Neurological - alert, oriented, normal speech, no focal findings or movement disorder noted  ASSESSMENT AND PLAN: 1. Moderate persistent asthma without complication: - Controlled.  - Continue Fluticasone Furoate-Vilanterol, Albuterol, and Fluticasone as prescribed. - Counseled to  follow-up if any flare-ups before next visit.  - Follow-up in 3 months or sooner if needed. - fluticasone (FLONASE) 50 MCG/ACT nasal spray; Place 2 sprays into both nostrils daily.  Dispense: 16 g; Refill: 1 - albuterol (VENTOLIN HFA) 108 (90 Base) MCG/ACT inhaler; Inhale 2 puffs into the lungs every 4 (four) hours as needed for wheezing or shortness of breath.  Dispense: 18 g; Refill: 0 - fluticasone furoate-vilanterol (BREO ELLIPTA) 200-25 MCG/INH AEPB; Inhale 1 puff into the lungs daily.  Dispense: 90 each; Refill: 0  Patient was given the opportunity to ask questions.  Patient verbalized understanding of the plan and was able to repeat key elements of the plan. Patient was given clear instructions to go to Emergency Department or return to medical center if symptoms don't improve, worsen, or new problems develop.The patient verbalized understanding.  Camillia Herter, NP

## 2020-08-31 ENCOUNTER — Telehealth: Payer: Self-pay | Admitting: Family

## 2020-08-31 DIAGNOSIS — J4540 Moderate persistent asthma, uncomplicated: Secondary | ICD-10-CM

## 2020-09-03 ENCOUNTER — Telehealth: Payer: Self-pay | Admitting: *Deleted

## 2020-09-03 ENCOUNTER — Other Ambulatory Visit: Payer: Self-pay | Admitting: *Deleted

## 2020-09-03 DIAGNOSIS — J4540 Moderate persistent asthma, uncomplicated: Secondary | ICD-10-CM

## 2020-09-03 MED ORDER — BREO ELLIPTA 200-25 MCG/INH IN AEPB: 1.0000 | INHALATION_SPRAY | Freq: Every day | RESPIRATORY_TRACT | 4 refills | Status: DC

## 2020-09-03 NOTE — Telephone Encounter (Signed)
Rx sent 

## 2020-09-03 NOTE — Addendum Note (Signed)
Addended by: Daisy Blossom, Annie Main L on: 09/03/2020 11:25 AM   Modules accepted: Orders

## 2020-09-03 NOTE — Telephone Encounter (Signed)
Unable to order electronically as normal (default to print both attempts to send) to the [facility], again. Phoned a verbal order and informed the patient via TC-left message.

## 2020-09-03 NOTE — Telephone Encounter (Signed)
Pts wife called stating that the pharmacy did not receive the refill request for [facility]. She states that the pt is completely out. Please advise.      [facility] [id number] - [place], [address]  [place] [address]  Phone: [phone] Fax: [phone]  Hours: Not open 24 hours

## 2020-09-03 NOTE — Telephone Encounter (Signed)
08/31/20 order for [facility] was not transmitted to [employer]. Reordered at this time.

## 2020-10-01 ENCOUNTER — Ambulatory Visit
Admission: EM | Admit: 2020-10-01 | Discharge: 2020-10-01 | Disposition: A | Discharge status: Home / Self Care | Payer: 59 | Attending: Emergency Medicine | Admitting: Emergency Medicine

## 2020-10-01 ENCOUNTER — Telehealth: Payer: Self-pay | Admitting: Internal Medicine

## 2020-10-01 ENCOUNTER — Other Ambulatory Visit: Payer: Self-pay | Admitting: Emergency Medicine

## 2020-10-01 ENCOUNTER — Ambulatory Visit (INDEPENDENT_AMBULATORY_CARE_PROVIDER_SITE_OTHER): Payer: 59

## 2020-10-01 DIAGNOSIS — R918 Other nonspecific abnormal finding of lung field: Secondary | ICD-10-CM

## 2020-10-01 DIAGNOSIS — R0781 Pleurodynia: Secondary | ICD-10-CM

## 2020-10-01 DIAGNOSIS — R059 Cough, unspecified: Secondary | ICD-10-CM

## 2020-10-01 DIAGNOSIS — R911 Solitary pulmonary nodule: Secondary | ICD-10-CM

## 2020-10-01 MED ORDER — AZITHROMYCIN 250 MG PO TABS: 250.0000 mg | ORAL_TABLET | Freq: Every day | ORAL | 0 refills | Status: DC

## 2020-10-01 MED ORDER — AMOXICILLIN 500 MG PO CAPS: 1000.0000 mg | ORAL_CAPSULE | Freq: Three times a day (TID) | ORAL | 0 refills | Status: DC

## 2020-10-01 MED FILL — AZITHROMYCIN 250 MG TABLET: 250 | 5 days supply | Qty: 6 | Fill #0

## 2020-10-01 MED FILL — AMOXICILLIN 500 MG CAPSULE: 500 | 7 days supply | Qty: 42 | Fill #0

## 2020-10-01 NOTE — ED Triage Notes (Signed)
Pt presents with complaints of cough, headache, and left sided rib pain that started yesterday. Reports pain is improved with Aleve. Reports pain is worse with a deep breath and cough. Patient denies shortness of breath or fever.

## 2020-10-01 NOTE — Telephone Encounter (Signed)
PT was in [place] today. Pt presented AVS with a note: Follow up with PCP : Needs CT SCAN  For lungs order.   Pt has appt 10/27/20

## 2020-10-01 NOTE — ED Provider Notes (Signed)
[name]    CSN: [id number] Arrival date & time: 10/01/20  1346      History   Chief Complaint Chief Complaint  Patient presents with  . Cough  . Headache    HPI Joseph Fitzgerald is a 49 y.o. male  Joseph Fitzgerald is a 49 y.o. male here for evaluation of a cough.  The cough is non-productive, without wheezing, dyspnea or hemoptysis and is aggravated by  cough. Onset of symptoms was 1 day ago, unchanged since that time.  Associated symptoms include chest pain and headache. Patient does have a history of asthma. Patient has not had recent travel. Patient does not have a history of smoking. Patient  has not had a previous chest x-ray. Patient has not had a PPD done. The following portions of the patient's history were reviewed and updated as appropriate: allergies, current medications, past family history, past medical history, past social history, past surgical history and problem list.     Past Medical History:  Diagnosis Date  . Asthma   . Diabetes mellitus without complication ([place])   . Shortness of breath dyspnea     Patient Active Problem List   Diagnosis Date Noted  . Hypokalemia 03/17/2018  . Asthma, moderate persistent 08/17/2016  . Vitamin D deficiency 09/01/2015  . Family history of diabetes mellitus in brother 08/31/2015  . Eczema 08/31/2015  . Environmental allergies 04/16/2014    Past Surgical History:  Procedure Laterality Date  . NO PAST SURGERIES         Home Medications    Prior to Admission medications   Medication Sig Start Date End Date Taking? Authorizing Provider  albuterol (VENTOLIN HFA) 108 (90 Base) MCG/ACT inhaler Inhale 2 puffs into the lungs every 4 (four) hours as needed for wheezing or shortness of breath. 07/17/20   Camillia Herter, NP  amoxicillin (AMOXIL) 500 MG capsule Take 2 capsules (1,000 mg total) by mouth 3 (three) times daily for 7 days. 10/01/20 10/08/20  Hall-Potvin, [place], PA-C  azithromycin (ZITHROMAX) 250 MG  tablet Take 1 tablet (250 mg total) by mouth daily. Take first 2 tablets together, then 1 every day until finished. 10/01/20   Hall-Potvin, [place], PA-C  fluticasone (FLONASE) 50 MCG/ACT nasal spray Place 2 sprays into both nostrils daily. 07/17/20   Camillia Herter, NP  fluticasone furoate-vilanterol (BREO ELLIPTA) 200-25 MCG/INH AEPB Inhale 1 puff into the lungs daily. 09/03/20   Ladell Pier, MD  ipratropium-albuterol (DUONEB) 0.5-2.5 (3) MG/3ML SOLN Take 3 mLs by nebulization every 6 (six) hours. 09/10/19   Black, Lezlie Octave, NP    Family History Family History  Problem Relation Age of Onset  . Asthma Brother   . Diabetes Brother     Social History Social History   Tobacco Use  . Smoking status: Never Smoker  . Smokeless tobacco: Current User    Types: Chew  Vaping Use  . Vaping Use: Never used  Substance Use Topics  . Alcohol use: Yes    Comment: beer  . Drug use: No     Allergies   Penicillins   Review of Systems Review of Systems  Constitutional: Negative for fatigue and fever.  Respiratory: Positive for cough. Negative for shortness of breath.   Cardiovascular: Positive for chest pain. Negative for palpitations.       L side, costal margin w/ deep inspiration, cough  Gastrointestinal: Negative for abdominal pain, diarrhea and vomiting.  Musculoskeletal: Negative for arthralgias and myalgias.  Skin: Negative for  rash and wound.  Neurological: Positive for headaches. Negative for dizziness, tremors, facial asymmetry, speech difficulty, weakness, light-headedness and numbness.  All other systems reviewed and are negative.    Physical Exam Triage Vital Signs ED Triage Vitals  Enc Vitals Group     BP 10/01/20 1402 135/81     Pulse Rate 10/01/20 1402 93     Resp 10/01/20 1402 19     Temp 10/01/20 1402 98.7 F (37.1 C)     Temp src --      SpO2 10/01/20 1402 96 %     Weight --      Height --      Head Circumference --      Peak Flow --      Pain Score  10/01/20 1400 0     Pain Loc --      Pain Edu? --      Excl. in [place]? --    No data found.  Updated Vital Signs BP 135/81   Pulse 93   Temp 98.7 F (37.1 C)   Resp 19   SpO2 96%   Visual Acuity Right Eye Distance:   Left Eye Distance:   Bilateral Distance:    Right Eye Near:   Left Eye Near:    Bilateral Near:     Physical Exam Constitutional:      General: He is not in acute distress.    Appearance: He is not toxic-appearing or diaphoretic.  HENT:     Head: Normocephalic and atraumatic.     Mouth/Throat:     Mouth: Mucous membranes are moist.     Pharynx: Oropharynx is clear.  Eyes:     General: No scleral icterus.    Conjunctiva/sclera: Conjunctivae normal.     Pupils: Pupils are equal, round, and reactive to light.  Neck:     Comments: Trachea midline, negative JVD Cardiovascular:     Rate and Rhythm: Normal rate and regular rhythm.     Heart sounds: Normal heart sounds.  Pulmonary:     Effort: Pulmonary effort is normal. No accessory muscle usage, prolonged expiration or respiratory distress.     Breath sounds: Normal air entry. No stridor or decreased air movement. Examination of the left-lower field reveals rhonchi. Rhonchi present. No wheezing.  Musculoskeletal:     Cervical back: Neck supple. No tenderness.  Lymphadenopathy:     Cervical: No cervical adenopathy.  Skin:    Capillary Refill: Capillary refill takes less than 2 seconds.     Coloration: Skin is not jaundiced or pale.     Findings: No rash.  Neurological:     Mental Status: He is alert and oriented to person, place, and time.      UC Treatments / Results  Labs (all labs ordered are listed, but only abnormal results are displayed) Labs Reviewed  NOVEL CORONAVIRUS, NAA    EKG   Radiology DG Chest 2 View  Result Date: 10/01/2020 CLINICAL DATA:  Productive cough. Pleuritic chest pain. LEFT-sided pain. EXAM: CHEST - 2 VIEW COMPARISON:  Radiograph 01/25/2019 FINDINGS: Normal cardiac  silhouette. There is a rounded mass within the superior segment of the LEFT lower lobe measuring 5.2 x 6.3 by 6.3 cm. RIGHT lung is clear. No pneumothorax. No acute osseous abnormality. IMPRESSION: LEFT lobe mass representing rounded pneumonia versus neoplasm. Recommend CT of the thorax with contrast for further evaluation. These results will be called to the ordering clinician or representative by the Radiologist Assistant, and communication documented in  the PACS or [employer]. Electronically Signed   By: Suzy Bouchard M.D.   On: 10/01/2020 14:49    Procedures Procedures (including critical care time)  Medications Ordered in UC Medications - No data to display  Initial Impression / Assessment and Plan / UC Course  I have reviewed the triage vital signs and the nursing notes.  Pertinent labs & imaging results that were available during my care of the patient were reviewed by me and considered in my medical decision making (see chart for details).     Patient afebrile, nontoxic, with SpO2 96%.  CXR significant for left lower lobe mass measuring 5.2 X 6.3 X 6.3 cm concerning for pneumonia VS neoplasm.  CT recommended.  This provider attempted to contact patient's PCP via phone: Left message on nurse line detailing today's visit and recommendations.  Patient will go pick up medications for CAP, then present to PCP to schedule CT/follow-up.  Return precautions discussed, patient verbalized understanding and is agreeable to plan. Final Clinical Impressions(s) / UC Diagnoses   Final diagnoses:  Cough  Pleuritic chest pain  Mass of lower lobe of left lung   Discharge Instructions   None    ED Prescriptions    Medication Sig Dispense Auth. Provider   amoxicillin (AMOXIL) 500 MG capsule Take 2 capsules (1,000 mg total) by mouth 3 (three) times daily for 7 days. 42 capsule Hall-Potvin, [place], PA-C   azithromycin (ZITHROMAX) 250 MG tablet Take 1 tablet (250 mg total) by mouth daily.  Take first 2 tablets together, then 1 every day until finished. 6 tablet Hall-Potvin, [place], PA-C     PDMP not reviewed this encounter.   Hall-Potvin, [place], [place] 10/01/20 1543

## 2020-10-01 NOTE — Telephone Encounter (Signed)
Will route to PCP for review. 

## 2020-10-03 LAB — SARS-COV-2, NAA 2 DAY TAT

## 2020-10-03 LAB — NOVEL CORONAVIRUS, NAA: SARS-CoV-2, NAA: NOT DETECTED

## 2020-10-05 NOTE — Telephone Encounter (Signed)
Patient was called and left a voicemail to call office and get his upcoming appointment details for his CT scan.

## 2020-10-12 ENCOUNTER — Telehealth: Payer: Self-pay

## 2020-10-12 NOTE — Telephone Encounter (Signed)
Prior Josem Kaufmann has been done

## 2020-10-14 ENCOUNTER — Ambulatory Visit (HOSPITAL_COMMUNITY)
Admission: RE | Admit: 2020-10-14 | Discharge: 2020-10-14 | Disposition: A | Discharge status: Home / Self Care | Payer: 59 | Source: Ambulatory Visit | Attending: Internal Medicine | Admitting: Internal Medicine

## 2020-10-14 ENCOUNTER — Encounter (HOSPITAL_COMMUNITY): Payer: Self-pay

## 2020-10-14 ENCOUNTER — Other Ambulatory Visit: Payer: Self-pay

## 2020-10-14 DIAGNOSIS — R918 Other nonspecific abnormal finding of lung field: Secondary | ICD-10-CM | POA: Diagnosis present

## 2020-10-14 LAB — POCT I-STAT CREATININE: Creatinine, Ser: 1 mg/dL (ref 0.61–1.24)

## 2020-10-14 MED ORDER — IOHEXOL 300 MG/ML  SOLN
75.0000 mL | Freq: Once | INTRAMUSCULAR | Status: AC | PRN
  Administered 2020-10-14: 75 mL via INTRAVENOUS

## 2020-10-18 ENCOUNTER — Telehealth: Payer: Self-pay | Admitting: Internal Medicine

## 2020-10-18 DIAGNOSIS — R918 Other nonspecific abnormal finding of lung field: Secondary | ICD-10-CM

## 2020-10-18 DIAGNOSIS — R590 Localized enlarged lymph nodes: Secondary | ICD-10-CM

## 2020-10-18 NOTE — Telephone Encounter (Signed)
Phone call placed to patient today to discuss results of the CAT scan of his chest.  This revealed a large area in the left lower lobe with some cavitations and enlarged lymph nodes suggesting infection but mass cannot be ruled out. Patient denies any fever, shortness of breath, night sweats or unexplained weight loss.  No recent travel outside of the [place].  He endorses cough sometimes productive of brown phlegm.  No hemoptysis. Advised patient that this will need to be evaluated further to rule out bacterial infection including tuberculosis or underlying malignancy.  I will refer him to pulmonary.  I will also have our case manager make a referral to the health department for sputum collection to screen for TB.  He will come to the lab tomorrow for blood test done including a QuantiFERON gold. Message sent to Dr. Joya Gaskins for further recommendations

## 2020-10-19 ENCOUNTER — Ambulatory Visit: Payer: MEDICAID | Attending: Internal Medicine

## 2020-10-19 ENCOUNTER — Other Ambulatory Visit: Payer: Self-pay

## 2020-10-19 ENCOUNTER — Telehealth: Payer: Self-pay

## 2020-10-19 DIAGNOSIS — R918 Other nonspecific abnormal finding of lung field: Secondary | ICD-10-CM

## 2020-10-19 DIAGNOSIS — R590 Localized enlarged lymph nodes: Secondary | ICD-10-CM

## 2020-10-19 NOTE — Telephone Encounter (Signed)
I agree with your plans  The lesion looks like CA to me and likely needs a bronchoscopy with endobronchial u/s  Agree with Pulmonary clinic referral

## 2020-10-19 NOTE — Telephone Encounter (Signed)
Call received from [place] [facility] [place] [facility]. Informed her of need for AFB testing.  She stated she has the information that she needs in Epic and reach out to the patient to schedule sputum collection. Instructed her to call this office if she needs additional information

## 2020-10-19 NOTE — Telephone Encounter (Signed)
Call placed to [facility] regarding need for patient to have AFB testing.  Call back requested to this CM.

## 2020-10-20 ENCOUNTER — Telehealth: Payer: Self-pay

## 2020-10-20 NOTE — Telephone Encounter (Signed)
Contacted pt to go over lab results pt is aware and doesn't have any questions or concerns 

## 2020-10-21 LAB — CBC WITH DIFFERENTIAL
Basophils Absolute: 0.1 10*3/uL (ref 0.0–0.2)
Basos: 1 %
EOS (ABSOLUTE): 0.5 10*3/uL — ABNORMAL HIGH (ref 0.0–0.4)
Eos: 5 %
Hematocrit: 46.6 % (ref 37.5–51.0)
Hemoglobin: 15.7 g/dL (ref 13.0–17.7)
Immature Grans (Abs): 0 10*3/uL (ref 0.0–0.1)
Immature Granulocytes: 0 %
Lymphocytes Absolute: 2.4 10*3/uL (ref 0.7–3.1)
Lymphs: 29 %
MCH: 30.6 pg (ref 26.6–33.0)
MCHC: 33.7 g/dL (ref 31.5–35.7)
MCV: 91 fL (ref 79–97)
Monocytes Absolute: 0.6 10*3/uL (ref 0.1–0.9)
Monocytes: 7 %
Neutrophils Absolute: 4.9 10*3/uL (ref 1.4–7.0)
Neutrophils: 58 %
RBC: 5.13 x10E6/uL (ref 4.14–5.80)
RDW: 12.1 % (ref 11.6–15.4)
WBC: 8.5 10*3/uL (ref 3.4–10.8)

## 2020-10-21 LAB — COMPREHENSIVE METABOLIC PANEL
ALT: 29 IU/L (ref 0–44)
AST: 22 IU/L (ref 0–40)
Albumin/Globulin Ratio: 1.1 — ABNORMAL LOW (ref 1.2–2.2)
Albumin: 4.1 g/dL (ref 4.0–5.0)
Alkaline Phosphatase: 145 IU/L — ABNORMAL HIGH (ref 44–121)
BUN/Creatinine Ratio: 11 (ref 9–20)
BUN: 12 mg/dL (ref 6–24)
Bilirubin Total: 0.4 mg/dL (ref 0.0–1.2)
CO2: 25 mmol/L (ref 20–29)
Calcium: 9.5 mg/dL (ref 8.7–10.2)
Chloride: 101 mmol/L (ref 96–106)
Creatinine, Ser: 1.06 mg/dL (ref 0.76–1.27)
GFR calc Af Amer: 96 mL/min/{1.73_m2} (ref >59)
GFR calc non Af Amer: 83 mL/min/{1.73_m2} (ref >59)
Globulin, Total: 3.9 g/dL (ref 1.5–4.5)
Glucose: 104 mg/dL — ABNORMAL HIGH (ref 65–99)
Potassium: 4.6 mmol/L (ref 3.5–5.2)
Sodium: 138 mmol/L (ref 134–144)
Total Protein: 8 g/dL (ref 6.0–8.5)

## 2020-10-21 LAB — QUANTIFERON-TB GOLD PLUS
QuantiFERON Mitogen Value: >10 IU/mL
QuantiFERON Nil Value: 0.19 IU/mL
QuantiFERON TB1 Ag Value: 0.06 IU/mL
QuantiFERON TB2 Ag Value: 0.06 IU/mL
QuantiFERON-TB Gold Plus: NEGATIVE

## 2020-10-25 ENCOUNTER — Other Ambulatory Visit: Payer: Self-pay | Admitting: Family

## 2020-10-25 DIAGNOSIS — J4540 Moderate persistent asthma, uncomplicated: Secondary | ICD-10-CM

## 2020-10-27 ENCOUNTER — Telehealth: Payer: Self-pay | Admitting: Internal Medicine

## 2020-10-27 ENCOUNTER — Ambulatory Visit: Payer: 59 | Admitting: Internal Medicine

## 2020-10-27 NOTE — Telephone Encounter (Signed)
Could you please contact pt

## 2020-11-09 ENCOUNTER — Ambulatory Visit: Payer: 59 | Admitting: Pulmonary Disease

## 2020-11-09 ENCOUNTER — Other Ambulatory Visit: Payer: Self-pay

## 2020-11-09 ENCOUNTER — Ambulatory Visit (INDEPENDENT_AMBULATORY_CARE_PROVIDER_SITE_OTHER): Payer: 59

## 2020-11-09 ENCOUNTER — Encounter: Payer: Self-pay | Admitting: Pulmonary Disease

## 2020-11-09 VITALS — BP 120/80 | HR 83 | Temp 97.3°F | Ht 62.0 in | Wt 170.0 lb

## 2020-11-09 DIAGNOSIS — R599 Enlarged lymph nodes, unspecified: Secondary | ICD-10-CM

## 2020-11-09 DIAGNOSIS — R918 Other nonspecific abnormal finding of lung field: Secondary | ICD-10-CM

## 2020-11-09 DIAGNOSIS — J189 Pneumonia, unspecified organism: Secondary | ICD-10-CM | POA: Diagnosis not present

## 2020-11-09 DIAGNOSIS — R9389 Abnormal findings on diagnostic imaging of other specified body structures: Secondary | ICD-10-CM

## 2020-11-09 DIAGNOSIS — Z789 Other specified health status: Secondary | ICD-10-CM

## 2020-11-09 NOTE — Patient Instructions (Addendum)
Thank you for visiting Dr. Valeta Harms at [facility]. Today we recommend the following:  Orders Placed This Encounter  Procedures  . DG Chest 2 View  . CT Chest Wo Contrast   CT Chest first week of January   Return in about 4 weeks (around 12/07/2020) for with APP or Dr. Valeta Harms. to be scheduled after repeat non-contrasted CT chest.     Please do your part to reduce the spread of COVID-19.

## 2020-11-09 NOTE — Progress Notes (Signed)
Synopsis: Referred in December 2021 for abnormal CT chest by Ladell Pier, MD  Subjective:   PATIENT ID: Joseph Fitzgerald GENDER: male DOB: 26-Aug-1971, MRN: [id number]  Chief Complaint  Patient presents with  . Consult    Sent by PCP--Dr. Wynetta Emery for lung mass    49 year old gentleman, past medical history of asthma, diabetes.  Patient initially presented to urgent care with pleuritic chest pain in October 01, 2020.  For evaluation of cough.  Patient had CT scan of the chest with a left lower lobe mass versus pneumonia.  Patient was treated with amoxicillin and azithromycin.  Recommended follow-up.  Patient had CT imaging after being seen by Dr. Wynetta Emery his primary care provider.  CT chest revealed a 5.6 x 5.5 dependent masslike density with a early suggestion of cavitation.  Enlarged left hilar subcarinal nodes either reactive versus metastatic.  Imaging was felt to be most suggestive of infection but malignancy not excluded.  OV 11/09/2020: Patient here today for follow-up after abnormal imaging.  Patient states he completed his course of antibiotics.  He does feel that his breathing is better.  Patient denies fevers chills night sweats.  He works as a Training and development officer.  He lives with his family.  No pets or animals in the home.  No unusual exposures.   Past Medical History:  Diagnosis Date  . Asthma   . Diabetes mellitus without complication ([place])   . Shortness of breath dyspnea      Family History  Problem Relation Age of Onset  . Asthma Brother   . Diabetes Brother      Past Surgical History:  Procedure Laterality Date  . NO PAST SURGERIES      Social History   Socioeconomic History  . Marital status: Single    Spouse name: Not on file  . Number of children: 3  . Years of education: 26  . Highest education level: Not on file  Occupational History  . Occupation: Cook   Tobacco Use  . Smoking status: Never Smoker  . Smokeless tobacco: Current User    Types: Chew  Vaping  Use  . Vaping Use: Never used  Substance and Sexual Activity  . Alcohol use: Yes    Comment: beer  . Drug use: No  . Sexual activity: Yes  Other Topics Concern  . Not on file  Social History Narrative  . Not on file   Social Determinants of Health   Financial Resource Strain: Not on file  Food Insecurity: Not on file  Transportation Needs: Not on file  Physical Activity: Not on file  Stress: Not on file  Social Connections: Not on file  Intimate Partner Violence: Not on file     Allergies  Allergen Reactions  . Penicillins Rash    Did it involve swelling of the face/tongue/throat, SOB, or low BP?No Did it involve sudden or severe rash/hives, skin peeling, or any reaction on the inside of your mouth or nose?Yes Did you need to seek medical attention at a hospital or doctor's office? no When did it last happen?Child If all above answers are "NO", may proceed with cephalosporin use.     Outpatient Medications Prior to Visit  Medication Sig Dispense Refill  . albuterol (VENTOLIN HFA) 108 (90 Base) MCG/ACT inhaler Inhale 2 puffs into the lungs every 4 (four) hours as needed for wheezing or shortness of breath. 18 g 0  . fluticasone (FLONASE) 50 MCG/ACT nasal spray SHAKE LIQUID AND USE 2 SPRAYS IN [facility]  NOSTRIL DAILY 16 g 1  . fluticasone furoate-vilanterol (BREO ELLIPTA) 200-25 MCG/INH AEPB Inhale 1 puff into the lungs daily. 60 each 4  . ipratropium-albuterol (DUONEB) 0.5-2.5 (3) MG/3ML SOLN Take 3 mLs by nebulization every 6 (six) hours. 360 mL 1  . azithromycin (ZITHROMAX) 250 MG tablet Take 1 tablet (250 mg total) by mouth daily. Take first 2 tablets together, then 1 every day until finished. (Patient not taking: Reported on 11/09/2020) 6 tablet 0   No facility-administered medications prior to visit.    Review of Systems  Constitutional: Negative for chills, fever, malaise/fatigue and weight loss.  HENT: Negative for hearing loss, sore throat and tinnitus.   Eyes:  Negative for blurred vision and double vision.  Respiratory: Negative for cough, hemoptysis, sputum production, shortness of breath, wheezing and stridor.   Cardiovascular: Negative for chest pain, palpitations, orthopnea, leg swelling and PND.  Gastrointestinal: Negative for abdominal pain, constipation, diarrhea, heartburn, nausea and vomiting.  Genitourinary: Negative for dysuria, hematuria and urgency.  Musculoskeletal: Negative for joint pain and myalgias.  Skin: Negative for itching and rash.  Neurological: Negative for dizziness, tingling, weakness and headaches.  Endo/Heme/Allergies: Negative for environmental allergies. Does not bruise/bleed easily.  Psychiatric/Behavioral: Negative for depression. The patient is not nervous/anxious and does not have insomnia.   All other systems reviewed and are negative.    Objective:  Physical Exam Vitals reviewed.  Constitutional:      General: He is not in acute distress.    Appearance: He is well-developed and well-nourished.  HENT:     Head: Normocephalic and atraumatic.     Mouth/Throat:     Mouth: Oropharynx is clear and moist.  Eyes:     General: No scleral icterus.    Conjunctiva/sclera: Conjunctivae normal.     Pupils: Pupils are equal, round, and reactive to light.  Neck:     Vascular: No JVD.     Trachea: No tracheal deviation.  Cardiovascular:     Rate and Rhythm: Normal rate and regular rhythm.     Pulses: Intact distal pulses.     Heart sounds: Normal heart sounds. No murmur heard.   Pulmonary:     Effort: Pulmonary effort is normal. No tachypnea, accessory muscle usage or respiratory distress.     Breath sounds: Normal breath sounds. No stridor. No wheezing, rhonchi or rales.  Abdominal:     General: Bowel sounds are normal. There is no distension.     Palpations: Abdomen is soft.     Tenderness: There is no abdominal tenderness.  Musculoskeletal:        General: No tenderness or edema.     Cervical back: Neck  supple.  Lymphadenopathy:     Cervical: No cervical adenopathy.  Skin:    General: Skin is warm and dry.     Capillary Refill: Capillary refill takes less than 2 seconds.     Findings: No rash.  Neurological:     Mental Status: He is alert and oriented to person, place, and time.  Psychiatric:        Mood and Affect: Mood and affect normal.        Behavior: Behavior normal.      Vitals:   11/09/20 1126  BP: 120/80  Pulse: 83  Temp: (!) 97.3 F (36.3 C)  TempSrc: Tympanic  SpO2: 97%  Weight: 170 lb (77.1 kg)  Height: 5' 2"  (1.575 m)   97% on RA BMI Readings from Last 3 Encounters:  11/09/20 31.09 kg/m  07/17/20 29.26 kg/m  10/17/19 29.87 kg/m   Wt Readings from Last 3 Encounters:  11/09/20 170 lb (77.1 kg)  07/17/20 165 lb 3.2 oz (74.9 kg)  10/17/19 168 lb 9.6 oz (76.5 kg)     CBC    Component Value Date/Time   WBC 8.5 10/19/2020 1012   WBC 8.8 09/08/2019 2052   RBC 5.13 10/19/2020 1012   RBC 5.13 09/08/2019 2052   HGB 15.7 10/19/2020 1012   HCT 46.6 10/19/2020 1012   PLT 231 09/08/2019 2052   PLT 207 01/04/2018 1540   MCV 91 10/19/2020 1012   MCH 30.6 10/19/2020 1012   MCH 32.6 09/08/2019 2052   MCHC 33.7 10/19/2020 1012   MCHC 34.9 09/08/2019 2052   RDW 12.1 10/19/2020 1012   LYMPHSABS 2.4 10/19/2020 1012   MONOABS 0.5 09/08/2019 1755   EOSABS 0.5 (H) 10/19/2020 1012   BASOSABS 0.1 10/19/2020 1012     Chest Imaging: CT chest 10/14/2020: Left lower lobe masslike density 5.6 x 5.5 cm overall appearance is suggestive of infection however malignancy not excluded.  Also has associated left hilar and subcarinal nodes that are enlarged.  I presume her reactive however metastasis is not excluded. The patient's images have been independently reviewed by me.    Chest x-ray 11/09/2020: Office chest x-ray completed today which revealed persistence of left lower lobe masslike density, however much smaller in size.The patient's images have been  independently reviewed by me.    Pulmonary Functions Testing Results: No flowsheet data found.  FeNO:   Pathology:   Echocardiogram:   Heart Catheterization:     Assessment & Plan:     ICD-10-CM   1. Lung mass  R91.8 DG Chest 2 View    CT Chest Wo Contrast  2. Abnormal CT of the chest  R93.89 DG Chest 2 View    CT Chest Wo Contrast  3. Community acquired pneumonia of left lower lobe of lung  J18.9   4. Adenopathy  R59.9 CT Chest Wo Contrast  5. Non-smoker  Z78.9     Discussion:  This is a 49 year old gentleman with an abnormal chest x-ray and abnormal CT scan concerning for a left lower lobe mass with associated adenopathy.  However the clinical features at the time were suggestive of pneumonia.  Patient was treated with antibiotics and a referral was placed for evaluation here in the pulmonary clinic.  Imaging today in the office was reviewed with the patient.  Chest x-ray in the office today revealed persistence of left lower lobe density, much smaller in size.  Plan: As for the patient's mediastinal adenopathy we will have a repeat noncontrasted CT of the chest in January 2022 to ensure resolution of adenopathy as well as the lesion identified above.  If there is persistence of the lesion on CT scan of the chest we discussed potentially needing to plan for bronchoscopy.  Patient is agreeable to this plan.  Patient to return to clinic in January after CT imaging is complete.    Current Outpatient Medications:  .  albuterol (VENTOLIN HFA) 108 (90 Base) MCG/ACT inhaler, Inhale 2 puffs into the lungs every 4 (four) hours as needed for wheezing or shortness of breath., Disp: 18 g, Rfl: 0 .  fluticasone (FLONASE) 50 MCG/ACT nasal spray, SHAKE LIQUID AND USE 2 SPRAYS IN EACH NOSTRIL DAILY, Disp: 16 g, Rfl: 1 .  fluticasone furoate-vilanterol (BREO ELLIPTA) 200-25 MCG/INH AEPB, Inhale 1 puff into the lungs daily., Disp: 60 each, Rfl: 4 .  ipratropium-albuterol (DUONEB)  0.5-2.5 (3) MG/3ML SOLN, Take 3 mLs by nebulization every 6 (six) hours., Disp: 360 mL, Rfl: 1  I spent 62 minutes dedicated to the care of this patient on the date of this encounter to include pre-visit review of records, face-to-face time with the patient discussing conditions above, post visit ordering of testing, clinical documentation with the electronic health record, making appropriate referrals as documented, and communicating necessary findings to members of the patients care team.   [name], DO [facility] 11/09/2020 11:53 AM

## 2020-11-13 ENCOUNTER — Encounter: Payer: Self-pay | Admitting: Internal Medicine

## 2020-11-13 ENCOUNTER — Ambulatory Visit: Payer: MEDICAID | Attending: Internal Medicine | Admitting: Internal Medicine

## 2020-11-13 ENCOUNTER — Other Ambulatory Visit: Payer: Self-pay

## 2020-11-13 VITALS — BP 113/76 | HR 72 | Temp 98.6°F | Resp 16 | Wt 169.2 lb

## 2020-11-13 DIAGNOSIS — J4540 Moderate persistent asthma, uncomplicated: Secondary | ICD-10-CM

## 2020-11-13 DIAGNOSIS — R590 Localized enlarged lymph nodes: Secondary | ICD-10-CM | POA: Diagnosis not present

## 2020-11-13 DIAGNOSIS — Z23 Encounter for immunization: Secondary | ICD-10-CM | POA: Diagnosis not present

## 2020-11-13 DIAGNOSIS — R918 Other nonspecific abnormal finding of lung field: Secondary | ICD-10-CM | POA: Diagnosis not present

## 2020-11-13 DIAGNOSIS — E119 Type 2 diabetes mellitus without complications: Secondary | ICD-10-CM | POA: Insufficient documentation

## 2020-11-13 DIAGNOSIS — E669 Obesity, unspecified: Secondary | ICD-10-CM | POA: Diagnosis not present

## 2020-11-13 NOTE — Patient Instructions (Addendum)
Healthy Eating Following a healthy eating pattern may help you to achieve and maintain a healthy body weight, reduce the risk of chronic disease, and live a long and productive life. It is important to follow a healthy eating pattern at an appropriate calorie level for your body. Your nutritional needs should be met primarily through food by choosing a variety of nutrient-rich foods. What are tips for following this plan? Reading food labels  Read labels and choose the following: ? Reduced or low sodium. ? Juices with 100% fruit juice. ? Foods with low saturated fats and high polyunsaturated and monounsaturated fats. ? Foods with whole grains, such as whole wheat, cracked wheat, brown rice, and wild rice. ? Whole grains that are fortified with folic acid. This is recommended for women who are pregnant or who want to become pregnant.  Read labels and avoid the following: ? Foods with a lot of added sugars. These include foods that contain brown sugar, corn sweetener, corn syrup, dextrose, fructose, glucose, high-fructose corn syrup, honey, invert sugar, lactose, malt syrup, maltose, molasses, raw sugar, sucrose, trehalose, or turbinado sugar.  Do not eat more than the following amounts of added sugar per day:  6 teaspoons (25 g) for women.  9 teaspoons (38 g) for men. ? Foods that contain processed or refined starches and grains. ? Refined grain products, such as white flour, degermed cornmeal, white bread, and white rice. Shopping  Choose nutrient-rich snacks, such as vegetables, whole fruits, and nuts. Avoid high-calorie and high-sugar snacks, such as potato chips, fruit snacks, and candy.  Use oil-based dressings and spreads on foods instead of solid fats such as butter, stick margarine, or cream cheese.  Limit pre-made sauces, mixes, and "instant" products such as flavored rice, instant noodles, and ready-made pasta.  Try more plant-protein sources, such as tofu, tempeh, black beans,  edamame, lentils, nuts, and seeds.  Explore eating plans such as the Mediterranean diet or vegetarian diet. Cooking  Use oil to saut or stir-fry foods instead of solid fats such as butter, stick margarine, or lard.  Try baking, boiling, grilling, or broiling instead of frying.  Remove the fatty part of meats before cooking.  Steam vegetables in water or broth. Meal planning   At meals, imagine dividing your plate into fourths: ? One-half of your plate is fruits and vegetables. ? One-fourth of your plate is whole grains. ? One-fourth of your plate is protein, especially lean meats, poultry, eggs, tofu, beans, or nuts.  Include low-fat dairy as part of your daily diet. Lifestyle  Choose healthy options in all settings, including home, work, school, restaurants, or stores.  Prepare your food safely: ? Wash your hands after handling raw meats. ? Keep food preparation surfaces clean by regularly washing with hot, soapy water. ? Keep raw meats separate from ready-to-eat foods, such as fruits and vegetables. ? Cook seafood, meat, poultry, and eggs to the recommended internal temperature. ? Store foods at safe temperatures. In general:  Keep cold foods at 59F (4.4C) or below.  Keep hot foods at 159F (60C) or above.  Keep your freezer at [facility] (-17.8C) or below.  Foods are no longer safe to eat when they have been between the temperatures of 40-159F (4.4-60C) for more than 2 hours. What foods should I eat? Fruits Aim to eat 2 cup-equivalents of fresh, canned (in natural juice), or frozen fruits each day. Examples of 1 cup-equivalent of fruit include 1 small apple, 8 large strawberries, 1 cup canned fruit,  cup  dried fruit, or 1 cup 100% juice. Vegetables Aim to eat 2-3 cup-equivalents of fresh and frozen vegetables each day, including different varieties and colors. Examples of 1 cup-equivalent of vegetables include 2 medium carrots, 2 cups raw, leafy greens, 1 cup chopped  vegetable (raw or cooked), or 1 medium baked potato. Grains Aim to eat 6 ounce-equivalents of whole grains each day. Examples of 1 ounce-equivalent of grains include 1 slice of bread, 1 cup ready-to-eat cereal, 3 cups popcorn, or  cup cooked rice, pasta, or cereal. Meats and other proteins Aim to eat 5-6 ounce-equivalents of protein each day. Examples of 1 ounce-equivalent of protein include 1 egg, 1/2 cup nuts or seeds, or 1 tablespoon (16 g) peanut butter. A cut of meat or fish that is the size of a deck of cards is about 3-4 ounce-equivalents.  Of the protein you eat each week, try to have at least 8 ounces come from seafood. This includes salmon, trout, herring, and anchovies. Dairy Aim to eat 3 cup-equivalents of fat-free or low-fat dairy each day. Examples of 1 cup-equivalent of dairy include 1 cup (240 mL) milk, 8 ounces (250 g) yogurt, 1 ounces (44 g) natural cheese, or 1 cup (240 mL) fortified soy milk. Fats and oils  Aim for about 5 teaspoons (21 g) per day. Choose monounsaturated fats, such as canola and olive oils, avocados, peanut butter, and most nuts, or polyunsaturated fats, such as sunflower, corn, and soybean oils, walnuts, pine nuts, sesame seeds, sunflower seeds, and flaxseed. Beverages  Aim for six 8-oz glasses of water per day. Limit coffee to three to five 8-oz cups per day.  Limit caffeinated beverages that have added calories, such as soda and energy drinks.  Limit alcohol intake to no more than 1 drink a day for nonpregnant women and 2 drinks a day for men. One drink equals 12 oz of beer (355 mL), 5 oz of wine (148 mL), or 1 oz of hard liquor (44 mL). Seasoning and other foods  Avoid adding excess amounts of salt to your foods. Try flavoring foods with herbs and spices instead of salt.  Avoid adding sugar to foods.  Try using oil-based dressings, sauces, and spreads instead of solid fats. This information is based on general [place] nutrition guidelines. For more  information, visit choosemyplate.gov. Exact amounts may vary based on your nutrition needs. Summary  A healthy eating plan may help you to maintain a healthy weight, reduce the risk of chronic diseases, and stay active throughout your life.  Plan your meals. Make sure you eat the right portions of a variety of nutrient-rich foods.  Try baking, boiling, grilling, or broiling instead of frying.  Choose healthy options in all settings, including home, work, school, restaurants, or stores. This information is not intended to replace advice given to you by your health care provider. Make sure you discuss any questions you have with your health care provider. Document Revised: 02/26/2018 Document Reviewed: 02/26/2018 Elsevier Patient Education  2020 Elsevier Inc.   Influenza Virus Vaccine injection (Fluarix) What is this medicine? INFLUENZA VIRUS VACCINE (in floo EN zuh VAHY ruhs vak SEEN) helps to reduce the risk of getting influenza also known as the flu. This medicine may be used for other purposes; ask your health care provider or pharmacist if you have questions. COMMON BRAND NAME(S): Fluarix, Fluzone What should I tell my health care provider before I take this medicine? They need to know if you have any of these conditions:  bleeding disorder   like hemophilia  fever or infection  Guillain-Barre syndrome or other neurological problems  immune system problems  infection with the human immunodeficiency virus (HIV) or AIDS  low blood platelet counts  multiple sclerosis  an unusual or allergic reaction to influenza virus vaccine, eggs, chicken proteins, latex, gentamicin, other medicines, foods, dyes or preservatives  pregnant or trying to get pregnant  breast-feeding How should I use this medicine? This vaccine is for injection into a muscle. It is given by a health care professional. A copy of Vaccine Information Statements will be given before each vaccination. Read this  sheet carefully each time. The sheet may change frequently. Talk to your pediatrician regarding the use of this medicine in children. Special care may be needed. Overdosage: If you think you have taken too much of this medicine contact a poison control center or emergency room at once. NOTE: This medicine is only for you. Do not share this medicine with others. What if I miss a dose? This does not apply. What may interact with this medicine?  chemotherapy or radiation therapy  medicines that lower your immune system like etanercept, anakinra, infliximab, and adalimumab  medicines that treat or prevent blood clots like warfarin  phenytoin  steroid medicines like prednisone or cortisone  theophylline  vaccines This list may not describe all possible interactions. Give your health care provider a list of all the medicines, herbs, non-prescription drugs, or dietary supplements you use. Also tell them if you smoke, drink alcohol, or use illegal drugs. Some items may interact with your medicine. What should I watch for while using this medicine? Report any side effects that do not go away within 3 days to your doctor or health care professional. Call your health care provider if any unusual symptoms occur within 6 weeks of receiving this vaccine. You may still catch the flu, but the illness is not usually as bad. You cannot get the flu from the vaccine. The vaccine will not protect against colds or other illnesses that may cause fever. The vaccine is needed every year. What side effects may I notice from receiving this medicine? Side effects that you should report to your doctor or health care professional as soon as possible:  allergic reactions like skin rash, itching or hives, swelling of the face, lips, or tongue Side effects that usually do not require medical attention (report to your doctor or health care professional if they continue or are bothersome):  fever  headache  muscle  aches and pains  pain, tenderness, redness, or swelling at site where injected  weak or tired This list may not describe all possible side effects. Call your doctor for medical advice about side effects. You may report side effects to FDA at 1-800-FDA-1088. Where should I keep my medicine? This vaccine is only given in a clinic, pharmacy, doctor's office, or other health care setting and will not be stored at home. NOTE: This sheet is a summary. It may not cover all possible information. If you have questions about this medicine, talk to your doctor, pharmacist, or health care provider.  2020 Elsevier/Gold Standard (2008-06-11 09:30:40)

## 2020-11-13 NOTE — Progress Notes (Signed)
Patient ID: Joseph Fitzgerald, male    DOB: Sep 05, 1971  MRN: [id number]  CC: Asthma   Subjective: Joseph Fitzgerald is a 49 y.o. male who presents for chronic ds management His concerns today include:  Pt with mod persistent asthma, obesity, environmental allergiesand eczema.   Seen at [facility] in November with cough. CXR revealed LT lobe mass ? Pneumonia vs malignancy. He was treated with antibiotics for community-acquired pneumonia. I ordered CT of chest which revealed mass-like consolidation in the left lower lobe measuring 5.6 x 5.5 cm with loculations suggestive of cavitation. Radiologist thinks findings are suggestive of infection but malignancy is difficult to exclude. Also showed enlarged left hilar and subcarinal lymphadenopathy. Patient is denied any fever, shortness of breath, night sweats or unexplained weight loss. Endorsed cough that is sometimes productive of brown phlegm. His QuantiFERON gold was negative.  We referred him to the health department for sputum cultures for AFB.  Patient states they had him quarantine for 2 weeks.  Cultures came back negative.  I referred him to pulmonology.   -He saw the pulmonologist Dr. Valeta Harms 4 days ago.  He did a chest x-ray that showed decrease in the left lower lobe consolidation most compatible with resolving pneumonia.  Radiologist recommended chest x-ray follow-up again in about 8 weeks. -Pulmonologist as scheduled him for repeat CT of the chest the early part of next month and he will follow-up at that time.  If mass and lymphadenopathy persists, he states that bronchoscopy would be necessary.  Today, patient reports that he is doing good in regards to his asthma.  Using [employer] as prescribed.  Noted to be overweight.  He is not getting any exercise outside of work.  He works in [employer] as a Training and development officer.  He thinks his eating habits are good.  He drinks water, coconut water and sweet tea.  Admits that his diet includes a lot of rice.  HM: Due for flu  vaccine.  He is agreeable to receiving it today.  He has not received COVID-19 vaccine as yet but is agreeable to getting it.   Patient Active Problem List   Diagnosis Date Noted   Hypokalemia 03/17/2018   Asthma, moderate persistent 08/17/2016   Vitamin D deficiency 09/01/2015   Family history of diabetes mellitus in brother 08/31/2015   Eczema 08/31/2015   Environmental allergies 04/16/2014     Current Outpatient Medications on File Prior to Visit  Medication Sig Dispense Refill   albuterol (VENTOLIN HFA) 108 (90 Base) MCG/ACT inhaler Inhale 2 puffs into the lungs every 4 (four) hours as needed for wheezing or shortness of breath. 18 g 0   fluticasone (FLONASE) 50 MCG/ACT nasal spray SHAKE LIQUID AND USE 2 SPRAYS IN EACH NOSTRIL DAILY 16 g 1   fluticasone furoate-vilanterol (BREO ELLIPTA) 200-25 MCG/INH AEPB Inhale 1 puff into the lungs daily. 60 each 4   ipratropium-albuterol (DUONEB) 0.5-2.5 (3) MG/3ML SOLN Take 3 mLs by nebulization every 6 (six) hours. 360 mL 1   No current facility-administered medications on file prior to visit.    Allergies  Allergen Reactions   Penicillins Rash    Did it involve swelling of the face/tongue/throat, SOB, or low BP?No Did it involve sudden or severe rash/hives, skin peeling, or any reaction on the inside of your mouth or nose?Yes Did you need to seek medical attention at a hospital or doctor's office? no When did it last happen?Child If all above answers are NO, may proceed with cephalosporin use.  Social History   Socioeconomic History   Marital status: Single    Spouse name: Not on file   Number of children: 3   Years of education: 12   Highest education level: Not on file  Occupational History   Occupation: Cook   Tobacco Use   Smoking status: Never Smoker   Smokeless tobacco: Current User    Types: Nurse, children's Use: Never used  Substance and Sexual Activity   Alcohol use: Yes     Comment: beer   Drug use: No   Sexual activity: Yes  Other Topics Concern   Not on file  Social History Narrative   Not on file   Social Determinants of Health   Financial Resource Strain: Not on file  Food Insecurity: Not on file  Transportation Needs: Not on file  Physical Activity: Not on file  Stress: Not on file  Social Connections: Not on file  Intimate Partner Violence: Not on file    Family History  Problem Relation Age of Onset   Asthma Brother    Diabetes Brother     Past Surgical History:  Procedure Laterality Date   NO PAST SURGERIES      ROS: Review of Systems Negative except as stated above  PHYSICAL EXAM: BP 113/76    Pulse 72    Temp 98.6 F (37 C)    Resp 16    Wt 169 lb 3.2 oz (76.7 kg)    SpO2 95%    BMI 30.95 kg/m   Wt Readings from Last 3 Encounters:  11/13/20 169 lb 3.2 oz (76.7 kg)  11/09/20 170 lb (77.1 kg)  07/17/20 165 lb 3.2 oz (74.9 kg)    Physical Exam  General appearance - alert, well appearing, and in no distress Mental status - normal mood, behavior, speech, dress, motor activity, and thought processes Neck -no cervical lymphadenopathy.   Chest -no wheezes crackles or rhonchi is heard at this time.  He has good air entry. Heart - normal rate, regular rhythm, normal S1, S2, no murmurs, rubs, clicks or gallops Extremities -no lower extremity edema. CMP Latest Ref Rng & Units 10/19/2020 10/14/2020 09/08/2019  Glucose 65 - 99 mg/dL 104(H) - -  BUN 6 - 24 mg/dL 12 - -  Creatinine 0.76 - 1.27 mg/dL 1.06 1.00 0.91  Sodium 134 - 144 mmol/L 138 - -  Potassium 3.5 - 5.2 mmol/L 4.6 - -  Chloride 96 - 106 mmol/L 101 - -  CO2 20 - 29 mmol/L 25 - -  Calcium 8.7 - 10.2 mg/dL 9.5 - -  Total Protein 6.0 - 8.5 g/dL 8.0 - -  Total Bilirubin 0.0 - 1.2 mg/dL 0.4 - -  Alkaline Phos 44 - 121 IU/L 145(H) - -  AST 0 - 40 IU/L 22 - -  ALT 0 - 44 IU/L 29 - -   Lipid Panel     Component Value Date/Time   CHOL 196 01/04/2018 1540   TRIG  192 (H) 01/04/2018 1540   HDL 44 01/04/2018 1540   CHOLHDL 4.5 01/04/2018 1540   CHOLHDL 3.9 08/18/2014 0920   VLDL 18 08/18/2014 0920   LDLCALC 114 (H) 01/04/2018 1540    CBC    Component Value Date/Time   WBC 8.5 10/19/2020 1012   WBC 8.8 09/08/2019 2052   RBC 5.13 10/19/2020 1012   RBC 5.13 09/08/2019 2052   HGB 15.7 10/19/2020 1012   HCT 46.6 10/19/2020 1012   PLT  231 09/08/2019 2052   PLT 207 01/04/2018 1540   MCV 91 10/19/2020 1012   MCH 30.6 10/19/2020 1012   MCH 32.6 09/08/2019 2052   MCHC 33.7 10/19/2020 1012   MCHC 34.9 09/08/2019 2052   RDW 12.1 10/19/2020 1012   LYMPHSABS 2.4 10/19/2020 1012   MONOABS 0.5 09/08/2019 1755   EOSABS 0.5 (H) 10/19/2020 1012   BASOSABS 0.1 10/19/2020 1012    ASSESSMENT AND PLAN:  1. Lung mass 2. Hilar lymphadenopathy -He has seen pulmonology.  Mass has decreased in size some.  Plan is for repeat CAT scan next month to see if the mass and lymphadenopathy have resolved.  If they persist, he will need bronchoscopy.  3. Moderate persistent asthma without complication Stable on his inhaler.  4. Obesity (BMI 30.0-34.9) Discussed and encourage healthy eating habits.  Advised patient to cut back on white carbohydrates and try not to eat rice every day.  Advised to eliminate sugary drinks from his diet which in his case is sweet tea, eat more white meat than red meat and incorporate fresh fruits and vegetables into the diet daily.  Encouraged him to get in some form of exercise at least 3 to 4 days a week for about 30 minutes.  5. Need for influenza vaccination Given today. I have informed him of places where he can go to get the COVID-19 vaccine as we do not have it here.    Patient was given the opportunity to ask questions.  Patient verbalized understanding of the plan and was able to repeat key elements of the plan.   No orders of the defined types were placed in this encounter.    Requested Prescriptions    No prescriptions  requested or ordered in this encounter    No follow-ups on file.  Karle Plumber, MD, FACP

## 2020-12-01 ENCOUNTER — Other Ambulatory Visit: Payer: Self-pay

## 2020-12-01 ENCOUNTER — Ambulatory Visit (HOSPITAL_COMMUNITY)
Admission: RE | Admit: 2020-12-01 | Discharge: 2020-12-01 | Disposition: A | Discharge status: Home / Self Care | Payer: 59 | Source: Ambulatory Visit | Attending: Pulmonary Disease | Admitting: Pulmonary Disease

## 2020-12-01 DIAGNOSIS — R599 Enlarged lymph nodes, unspecified: Secondary | ICD-10-CM | POA: Insufficient documentation

## 2020-12-01 DIAGNOSIS — R918 Other nonspecific abnormal finding of lung field: Secondary | ICD-10-CM | POA: Diagnosis not present

## 2020-12-01 DIAGNOSIS — R9389 Abnormal findings on diagnostic imaging of other specified body structures: Secondary | ICD-10-CM | POA: Insufficient documentation

## 2020-12-07 ENCOUNTER — Encounter: Payer: Self-pay | Admitting: Adult Health

## 2020-12-07 ENCOUNTER — Ambulatory Visit (INDEPENDENT_AMBULATORY_CARE_PROVIDER_SITE_OTHER): Payer: 59 | Admitting: Adult Health

## 2020-12-07 ENCOUNTER — Other Ambulatory Visit: Payer: Self-pay

## 2020-12-07 VITALS — BP 118/74 | HR 79 | Temp 97.4°F | Ht 61.0 in | Wt 168.6 lb

## 2020-12-07 DIAGNOSIS — J189 Pneumonia, unspecified organism: Secondary | ICD-10-CM | POA: Diagnosis not present

## 2020-12-07 DIAGNOSIS — J4540 Moderate persistent asthma, uncomplicated: Secondary | ICD-10-CM | POA: Diagnosis not present

## 2020-12-07 DIAGNOSIS — R918 Other nonspecific abnormal finding of lung field: Secondary | ICD-10-CM

## 2020-12-07 DIAGNOSIS — K029 Dental caries, unspecified: Secondary | ICD-10-CM

## 2020-12-07 NOTE — Progress Notes (Signed)
@Patient  ID: Joseph Fitzgerald, male    DOB: Mar 06, 1971, 50 y.o.   MRN: [id number]  Chief Complaint  Patient presents with  . Lung Mass    Referring provider: Ladell Pier, MD  HPI: 50 year old male never smoker seen for pulmonary consult November 09, 2020 for lung mass  TEST/EVENTS :  CT October 14, 2020 CT chest measured a 5.6 x 5.5 masslike density with early suggestion of cavitation.  Enlarged left hilar subcarinal nodes  12/07/2020 Follow up : Lung mass  Patient returns for a 1 month follow-up.  Patient was seen last visit for a pulmonary consult for abnormal CT chest.  Patient had some pleuritic chest pain.  Was seen in urgent care a subsequent CT chest showed a 5.6 masslike density in the left lower lobe there were small internal air loculations suggestive of a cavitation.  There was also an enlarged left hilar and subcarinal lymph node.  He was treated with amoxicillin 2041m Three times a day  For 7 days  and azithromycin x 5 days . .Marland Kitchen Patient was felt this most likely representative of underlying infection.  Was recommended to finish all antibiotics.  And will have follow-up scans.  Follow-up CT was done December 01, 2020 that showed a decrease in the consolidation measuring 5.5 x 3.5.  Felt felt to represent a resolving left lower lobe pneumonia. Since last visit patient is feeling better. Has minimal cough .  No weight loss. Good appetite. No dysphagia.  Work as a cTraining and development officer. No pets . No unusual hobbies . No travel.  Has multiple dental issues. Pulled all the lower teeth out himself. Now has dental insurance. Discussed dental follow up .    Allergies  Allergen Reactions  . Penicillins Rash    Did it involve swelling of the face/tongue/throat, SOB, or low BP?No Did it involve sudden or severe rash/hives, skin peeling, or any reaction on the inside of your mouth or nose?Yes Did you need to seek medical attention at a hospital or doctor's office? no When did it last  happen?Child If all above answers are "NO", may proceed with cephalosporin use.    Immunization History  Administered Date(s) Administered  . Influenza Split 09/03/2013  . Influenza,inj,Quad PF,6+ Mos 01/22/2015, 08/31/2015, 08/17/2016, 08/25/2017, 09/13/2018, 09/10/2019, 11/13/2020  . Pneumococcal Polysaccharide-23 06/13/2013, 09/10/2019  . Tdap 10/28/2016    Past Medical History:  Diagnosis Date  . Asthma   . Shortness of breath dyspnea     Tobacco History: Social History   Tobacco Use  Smoking Status Never Smoker  Smokeless Tobacco Current User  . Types: Chew   Ready to quit: Not Answered Counseling given: Not Answered   Outpatient Medications Prior to Visit  Medication Sig Dispense Refill  . albuterol (VENTOLIN HFA) 108 (90 Base) MCG/ACT inhaler Inhale 2 puffs into the lungs every 4 (four) hours as needed for wheezing or shortness of breath. 18 g 0  . fluticasone (FLONASE) 50 MCG/ACT nasal spray SHAKE LIQUID AND USE 2 SPRAYS IN EACH NOSTRIL DAILY 16 g 1  . fluticasone furoate-vilanterol (BREO ELLIPTA) 200-25 MCG/INH AEPB Inhale 1 puff into the lungs daily. 60 each 4  . ipratropium-albuterol (DUONEB) 0.5-2.5 (3) MG/3ML SOLN Take 3 mLs by nebulization every 6 (six) hours. 360 mL 1   No facility-administered medications prior to visit.     Review of Systems:   Constitutional:   No  weight loss, night sweats,  Fevers, chills, fatigue, or  lassitude.  HEENT:   No  headaches,  Difficulty swallowing,  Tooth/dental problems, or  Sore throat,                No sneezing, itching, ear ache, nasal congestion, post nasal drip,   CV:  No chest pain,  Orthopnea, PND, swelling in lower extremities, anasarca, dizziness, palpitations, syncope.   GI  No heartburn, indigestion, abdominal pain, nausea, vomiting, diarrhea, change in bowel habits, loss of appetite, bloody stools.   Resp: No shortness of breath with exertion or at rest.  No excess mucus, no productive cough,  No  non-productive cough,  No coughing up of blood.  No change in color of mucus.  No wheezing.  No chest wall deformity  Skin: no rash or lesions.  GU: no dysuria, change in color of urine, no urgency or frequency.  No flank pain, no hematuria   MS:  No joint pain or swelling.  No decreased range of motion.  No back pain.    Physical Exam  BP 118/74 (BP Location: Left Arm, Cuff Size: Normal)   Pulse 79   Temp (!) 97.4 F (36.3 C)   Ht 5' 1"  (1.549 m)   Wt 168 lb 9.6 oz (76.5 kg)   SpO2 98%   BMI 31.86 kg/m   GEN: A/Ox3; pleasant , NAD, well nourished    HEENT:  [name]/AT,   , NOSE-clear, THROAT-clear, no lesions, no postnasal drip or exudate noted. Poor dentition .   NECK:  Supple w/ fair ROM; no JVD; normal carotid impulses w/o bruits; no thyromegaly or nodules palpated; no lymphadenopathy.    RESP  Clear  P & A; w/o, wheezes/ rales/ or rhonchi. no accessory muscle use, no dullness to percussion  CARD:  RRR, no m/r/g, no peripheral edema, pulses intact, no cyanosis or clubbing.  GI:   Soft & nt; nml bowel sounds; no organomegaly or masses detected.   Musco: Warm bil, no deformities or joint swelling noted.   Neuro: alert, no focal deficits noted.    Skin: Warm, no lesions or rashes    Lab Results:   BNP No results found for: BNP  ProBNP No results found for: PROBNP  Imaging: DG Chest 2 View  Result Date: 11/09/2020 CLINICAL DATA:  Follow-up pneumonia EXAM: CHEST - 2 VIEW COMPARISON:  10/01/2020 chest radiograph. FINDINGS: Stable cardiomediastinal silhouette with normal heart size. No pneumothorax. No pleural effusion. Rounded focus of consolidation in the left lower lobe is significantly decreased, now 3.4 cm, previously 6.4 cm. No pulmonary edema. No new consolidative airspace disease. IMPRESSION: Significant interval decrease in rounded left lower lobe consolidation, most compatible with resolving pneumonia. Continued chest radiograph follow-up advised in 8 weeks  given the persistent opacity. Electronically Signed   By: Ilona Sorrel M.D.   On: 11/09/2020 18:11   CT Chest Wo Contrast  Result Date: 12/01/2020 CLINICAL DATA:  50 year old male with history of mediastinal lymphadenopathy. Follow-up study. EXAM: CT CHEST WITHOUT CONTRAST TECHNIQUE: Multidetector CT imaging of the chest was performed following the standard protocol without IV contrast. COMPARISON:  Chest CT 10/14/2020. FINDINGS: Cardiovascular: Heart size is normal. There is no significant pericardial fluid, thickening or pericardial calcification. There is aortic atherosclerosis, as well as atherosclerosis of the great vessels of the mediastinum and the coronary arteries, including calcified atherosclerotic plaque in the left anterior descending coronary artery. Mediastinum/Nodes: No pathologically enlarged mediastinal or hilar lymph nodes. Please note that accurate exclusion of hilar adenopathy is limited on noncontrast CT scans. Esophagus is unremarkable in appearance. No axillary  lymphadenopathy. Lungs/Pleura: Previously noted area of airspace consolidation is less extensive, and appears to have contracted slightly, however, there continues to be a masslike area of architectural distortion in its wake, currently measuring 5.5 x 3.5 cm. Linear scarring extends caudally from this region into the basal segments of the left lower lobe posteriorly. No right-sided airspace consolidation. No pleural effusions. No other definite suspicious appearing pulmonary nodules or masses are noted. Upper Abdomen: Unremarkable. Musculoskeletal: There are no aggressive appearing lytic or blastic lesions noted in the visualized portions of the skeleton. IMPRESSION: 1. Probable resolving left lower lobe pneumonia with residual rounded area of persistent consolidation and likely developing scarring. Close attention on follow-up studies is strongly recommended to ensure continued regression of these findings, as the possibility of  underlying neoplasm is not entirely excluded. Repeat noncontrast chest CT is recommended in 2 months. 2. No definite lymphadenopathy identified on today's noncontrast CT examination. 3. Aortic atherosclerosis, in addition to left anterior descending coronary artery disease. Please note that although the presence of coronary artery calcium documents the presence of coronary artery disease, the severity of this disease and any potential stenosis cannot be assessed on this non-gated CT examination. Assessment for potential risk factor modification, dietary therapy or pharmacologic therapy may be warranted, if clinically indicated. Aortic Atherosclerosis (ICD10-I70.0). Electronically Signed   By: Vinnie Langton M.D.   On: 12/01/2020 10:13      No flowsheet data found.  No results found for: NITRICOXIDE      Assessment & Plan:   Lung mass LLL lung mass most likely a pnuemonia. Patient has very poor dentition. Needs dental referral .  Will have him return in 4 weeks with chest xray . Will need CT chest in 2-3 months for resolution .   Plan  Patient Instructions  Refer to Dentist.  Continue on current regimen .  Follow up in 4 weeks with chest xray .  Please contact office for sooner follow up if symptoms do not improve or worsen or seek emergency care       Asthma, moderate persistent Continue on present regimen -appears stable on current regimen consider PFT when recovered from [place]  Patient Instructions  Refer to Dentist.  Continue on current regimen .  Follow up in 4 weeks with chest xray .  Please contact office for sooner follow up if symptoms do not improve or worsen or seek emergency care     '  Dental caries Severe dental caries - may be contributing to severe PNA .  Refer to dentist       Rexene Edison, NP 12/07/2020

## 2020-12-07 NOTE — Progress Notes (Signed)
Agreed. Thanks for seeing him. Glad its getting smaller. [name], DO [facility] 12/07/2020 5:23 PM

## 2020-12-07 NOTE — Assessment & Plan Note (Signed)
Severe dental caries - may be contributing to severe PNA .  Refer to dentist

## 2020-12-07 NOTE — Assessment & Plan Note (Signed)
Continue on present regimen -appears stable on current regimen consider PFT when recovered from [place]  Patient Instructions  Refer to Dentist.  Continue on current regimen .  Follow up in 4 weeks with chest xray .  Please contact office for sooner follow up if symptoms do not improve or worsen or seek emergency care     '

## 2020-12-07 NOTE — Assessment & Plan Note (Signed)
LLL lung mass most likely a pnuemonia. Patient has very poor dentition. Needs dental referral .  Will have him return in 4 weeks with chest xray . Will need CT chest in 2-3 months for resolution .   Plan  Patient Instructions  Refer to Dentist.  Continue on current regimen .  Follow up in 4 weeks with chest xray .  Please contact office for sooner follow up if symptoms do not improve or worsen or seek emergency care

## 2020-12-07 NOTE — Patient Instructions (Addendum)
Refer to Dentist.  Continue on current regimen .  Follow up in 4 weeks with chest xray .  Please contact office for sooner follow up if symptoms do not improve or worsen or seek emergency care

## 2020-12-29 ENCOUNTER — Ambulatory Visit
Admission: EM | Admit: 2020-12-29 | Discharge: 2020-12-29 | Disposition: A | Discharge status: Home / Self Care | Payer: 59 | Attending: Emergency Medicine | Admitting: Emergency Medicine

## 2020-12-29 ENCOUNTER — Ambulatory Visit (INDEPENDENT_AMBULATORY_CARE_PROVIDER_SITE_OTHER): Payer: 59

## 2020-12-29 ENCOUNTER — Other Ambulatory Visit: Payer: Self-pay

## 2020-12-29 ENCOUNTER — Other Ambulatory Visit: Payer: Self-pay | Admitting: Pulmonary Disease

## 2020-12-29 DIAGNOSIS — R059 Cough, unspecified: Secondary | ICD-10-CM | POA: Diagnosis not present

## 2020-12-29 DIAGNOSIS — R918 Other nonspecific abnormal finding of lung field: Secondary | ICD-10-CM | POA: Diagnosis not present

## 2020-12-29 DIAGNOSIS — R0781 Pleurodynia: Secondary | ICD-10-CM | POA: Diagnosis not present

## 2020-12-29 MED ORDER — HYDROCODONE-ACETAMINOPHEN 5-325 MG PO TABS
1.0000 | ORAL_TABLET | Freq: Four times a day (QID) | ORAL | 0 refills | Status: DC | PRN
Start: 2020-12-29 — End: 2021-06-03

## 2020-12-29 MED ORDER — LEVOFLOXACIN 750 MG PO TABS: 750.0000 mg | ORAL_TABLET | Freq: Every day | ORAL | 0 refills | Status: DC

## 2020-12-29 MED ORDER — HYDROCODONE-ACETAMINOPHEN 5-325 MG PO TABS
1.0000 | ORAL_TABLET | Freq: Four times a day (QID) | ORAL | 0 refills | Status: DC | PRN
Start: 2020-12-29 — End: 2020-12-29

## 2020-12-29 MED ORDER — BENZONATATE 200 MG PO CAPS: 200.0000 mg | ORAL_CAPSULE | Freq: Three times a day (TID) | ORAL | 0 refills | Status: DC | PRN

## 2020-12-29 MED FILL — BENZONATATE 100 MG CAPS: 100 | 9 days supply | Qty: 56 | Fill #0

## 2020-12-29 MED FILL — levoFLOXacin 750 MG TABS: 750 | 7 days supply | Qty: 7 | Fill #0

## 2020-12-29 NOTE — Progress Notes (Unsigned)
Orders placed for Super D CT prior to office visit next week  [name], DO [facility] 12/29/2020 4:05 PM

## 2020-12-29 NOTE — ED Triage Notes (Signed)
Pt states has a mass to lt lung and has seen the specialist yet. Pt c/o pain on inspiration. Denies SOB. No distress noted.

## 2020-12-29 NOTE — ED Provider Notes (Signed)
[name]    CSN: [id number] Arrival date & time: 12/29/20  1117      History   Chief Complaint Chief Complaint  Patient presents with  . rib pain    HPI Joseph Fitzgerald is a 50 y.o. male history of asthma, lung mass presenting today for evaluation of left-sided rib pain.  Patient reports he has had a cough for the past 2 weeks, reports worsening pain in his left lower ribs for the past 3 to 4 days.  Symptoms are new since last imaging-November 2021 was noted to have mass in this area and has had follow-up imaging and CT scans and is following up regularly with pulmonology.  Mass did seem to be improving after a round of antibiotics, more recent CT scan on January 2022 with following results:   IMPRESSION: 1. Probable resolving left lower lobe pneumonia with residual rounded area of persistent consolidation and likely developing scarring. Close attention on follow-up studies is strongly recommended to ensure continued regression of these findings, as the possibility of underlying neoplasm is not entirely excluded. Repeat noncontrast chest CT is recommended in 2 months. 2. No definite lymphadenopathy identified on today's noncontrast CT examination. 3. Aortic atherosclerosis, in addition to left anterior descending coronary artery disease. Please note that although the presence of coronary artery calcium documents the presence of coronary artery disease, the severity of this disease and any potential stenosis cannot be assessed on this non-gated CT examination. Assessment for potential risk factor modification, dietary therapy or pharmacologic therapy may be warranted, if clinically indicated.  HPI  Past Medical History:  Diagnosis Date  . Asthma   . Shortness of breath dyspnea     Patient Active Problem List   Diagnosis Date Noted  . Lung mass 12/07/2020  . Dental caries 12/07/2020  . Diabetes mellitus without complication ([place])   . Hypokalemia 03/17/2018   . Asthma, moderate persistent 08/17/2016  . Vitamin D deficiency 09/01/2015  . Family history of diabetes mellitus in brother 08/31/2015  . Eczema 08/31/2015  . Environmental allergies 04/16/2014    Past Surgical History:  Procedure Laterality Date  . NO PAST SURGERIES         Home Medications    Prior to Admission medications   Medication Sig Start Date End Date Taking? Authorizing Provider  benzonatate (TESSALON) 200 MG capsule Take 1 capsule (200 mg total) by mouth 3 (three) times daily as needed for up to 7 days for cough. 12/29/20 01/05/21 Yes Wieters, Hallie C, PA-C  HYDROcodone-acetaminophen (NORCO/VICODIN) 5-325 MG tablet Take 1-2 tablets by mouth every 6 (six) hours as needed. 12/29/20  Yes Wieters, Hallie C, PA-C  levofloxacin (LEVAQUIN) 750 MG tablet Take 1 tablet (750 mg total) by mouth daily. 12/29/20  Yes Wieters, Hallie C, PA-C  albuterol (VENTOLIN HFA) 108 (90 Base) MCG/ACT inhaler Inhale 2 puffs into the lungs every 4 (four) hours as needed for wheezing or shortness of breath. 07/17/20   Camillia Herter, NP  fluticasone (FLONASE) 50 MCG/ACT nasal spray SHAKE LIQUID AND USE 2 SPRAYS IN [facility] NOSTRIL DAILY 10/26/20   Nicolette Bang, DO  fluticasone furoate-vilanterol (BREO ELLIPTA) 200-25 MCG/INH AEPB Inhale 1 puff into the lungs daily. 09/03/20   Ladell Pier, MD  ipratropium-albuterol (DUONEB) 0.5-2.5 (3) MG/3ML SOLN Take 3 mLs by nebulization every 6 (six) hours. 09/10/19   Black, Lezlie Octave, NP    Family History Family History  Problem Relation Age of Onset  . Asthma Brother   .  Diabetes Brother     Social History Social History   Tobacco Use  . Smoking status: Never Smoker  . Smokeless tobacco: Current User    Types: Chew  Vaping Use  . Vaping Use: Never used  Substance Use Topics  . Alcohol use: Yes    Comment: beer  . Drug use: No     Allergies   Penicillins   Review of Systems Review of Systems  Constitutional: Negative for activity  change, appetite change, chills, fatigue and fever.  HENT: Negative for congestion, ear pain, rhinorrhea, sinus pressure, sore throat and trouble swallowing.   Eyes: Negative for discharge and redness.  Respiratory: Positive for cough. Negative for chest tightness and shortness of breath.   Cardiovascular: Positive for chest pain.  Gastrointestinal: Negative for abdominal pain, diarrhea, nausea and vomiting.  Musculoskeletal: Negative for myalgias.  Skin: Negative for rash.  Neurological: Negative for dizziness, light-headedness and headaches.     Physical Exam Triage Vital Signs ED Triage Vitals [12/29/20 1209]  Enc Vitals Group     BP (!) 154/104     Pulse Rate 80     Resp 18     Temp 97.6 F (36.4 C)     Temp Source Oral     SpO2 95 %     Weight      Height      Head Circumference      Peak Flow      Pain Score 0     Pain Loc      Pain Edu?      Excl. in [place]?    No data found.  Updated Vital Signs BP (!) 154/104 (BP Location: Left Arm)   Pulse 80   Temp 97.6 F (36.4 C) (Oral)   Resp 18   SpO2 95%   Visual Acuity Right Eye Distance:   Left Eye Distance:   Bilateral Distance:    Right Eye Near:   Left Eye Near:    Bilateral Near:     Physical Exam Vitals and nursing note reviewed.  Constitutional:      Appearance: He is well-developed and well-nourished.     Comments: No acute distress  HENT:     Head: Normocephalic and atraumatic.     Nose: Nose normal.  Eyes:     Conjunctiva/sclera: Conjunctivae normal.  Cardiovascular:     Rate and Rhythm: Normal rate.  Pulmonary:     Effort: Pulmonary effort is normal. No respiratory distress.     Comments: Crackles to base on left side Abdominal:     General: There is no distension.  Musculoskeletal:        General: Normal range of motion.     Cervical back: Neck supple.  Skin:    General: Skin is warm and dry.  Neurological:     Mental Status: He is alert and oriented to person, place, and time.   Psychiatric:        Mood and Affect: Mood and affect normal.      UC Treatments / Results  Labs (all labs ordered are listed, but only abnormal results are displayed) Labs Reviewed - No data to display  EKG   Radiology DG Chest 2 View  Result Date: 12/29/2020 CLINICAL DATA:  Cough. Increasing left lower rib pain. Mass 2 left lower lung. EXAM: CHEST - 2 VIEW COMPARISON:  Radiograph December 13 21.  CT chest 12/01/2020. FINDINGS: Persistent masslike opacity in the left lower lobe, which has increased since 11/09/2020  radiograph. Direct comparison with the 12/01/2020 CT chest is difficult given differences in modality. New small left pleural effusion. No visible pneumothorax. Cardiomediastinal silhouette is within normal limits. No evidence of acute osseous abnormality. IMPRESSION: Persistent masslike opacity in the left lower lobe with new small left pleural effusion. The opacity is increased relative to prior radiograph from 11/09/2020. Comparison with recent CT chest is limited given differences in modality. Differential considerations remain pneumonia versus malignancy and recommend follow-up chest CT as directed from the 12/01/2020 CT chest. Electronically Signed   By: Margaretha Sheffield MD   On: 12/29/2020 13:22    Procedures Procedures (including critical care time)  Medications Ordered in UC Medications - No data to display  Initial Impression / Assessment and Plan / UC Course  I have reviewed the triage vital signs and the nursing notes.  Pertinent labs & imaging results that were available during my care of the patient were reviewed by me and considered in my medical decision making (see chart for details).     X-ray with worsening opacity appears larger than prior imaging, along with effusion.  Will repeat round of antibiotics with Levaquin, previously treated with Augmentin and azithromycin.  Provided Tessalon to use for cough and hydrocodone for pain.  Close follow-up with  pulmonology, patient to go to emergency room if symptoms progressing or worsening.  Given new effusion concerning for possible malignancy.  Discussed strict return precautions. Patient verbalized understanding and is agreeable with plan.  Final Clinical Impressions(s) / UC Diagnoses   Final diagnoses:  Mass of lower lobe of left lung     Discharge Instructions     Increase in size of area on left lower lung Begin Levaquin daily for the next week Tessalon every 8 hours for cough Tylenol/ibuprofen for mild to moderate pain Hydrocodone for severe pain Please follow-up with pulmonology Go to emergency room if worsening     ED Prescriptions    Medication Sig Dispense Auth. Provider   levofloxacin (LEVAQUIN) 750 MG tablet Take 1 tablet (750 mg total) by mouth daily. 7 tablet Wieters, Hallie C, PA-C   benzonatate (TESSALON) 200 MG capsule Take 1 capsule (200 mg total) by mouth 3 (three) times daily as needed for up to 7 days for cough. 28 capsule Wieters, Hallie C, PA-C   HYDROcodone-acetaminophen (NORCO/VICODIN) 5-325 MG tablet Take 1-2 tablets by mouth every 6 (six) hours as needed. 10 tablet Wieters, [place] C, PA-C     I have reviewed the PDMP during this encounter.   Janith Lima, [place] 12/29/20 1419

## 2020-12-29 NOTE — Discharge Instructions (Addendum)
Increase in size of area on left lower lung Begin Levaquin daily for the next week Tessalon every 8 hours for cough Tylenol/ibuprofen for mild to moderate pain Hydrocodone for severe pain Please follow-up with pulmonology Go to emergency room if worsening

## 2020-12-30 ENCOUNTER — Inpatient Hospital Stay (HOSPITAL_COMMUNITY)
Admission: EM | Admit: 2020-12-30 | Discharge: 2021-01-09 | DRG: 177 | Disposition: A | Discharge status: Home / Self Care | Payer: 59 | Attending: Internal Medicine | Admitting: Internal Medicine

## 2020-12-30 ENCOUNTER — Other Ambulatory Visit: Payer: Self-pay

## 2020-12-30 ENCOUNTER — Telehealth: Payer: Self-pay | Admitting: Pulmonary Disease

## 2020-12-30 DIAGNOSIS — I700 Atherosclerosis of aorta: Secondary | ICD-10-CM | POA: Diagnosis present

## 2020-12-30 DIAGNOSIS — K089 Disorder of teeth and supporting structures, unspecified: Secondary | ICD-10-CM

## 2020-12-30 DIAGNOSIS — Z9689 Presence of other specified functional implants: Secondary | ICD-10-CM

## 2020-12-30 DIAGNOSIS — R0602 Shortness of breath: Secondary | ICD-10-CM

## 2020-12-30 DIAGNOSIS — Z720 Tobacco use: Secondary | ICD-10-CM | POA: Diagnosis present

## 2020-12-30 DIAGNOSIS — Z880 Allergy status to penicillin: Secondary | ICD-10-CM

## 2020-12-30 DIAGNOSIS — R739 Hyperglycemia, unspecified: Secondary | ICD-10-CM | POA: Diagnosis present

## 2020-12-30 DIAGNOSIS — J869 Pyothorax without fistula: Secondary | ICD-10-CM | POA: Diagnosis not present

## 2020-12-30 DIAGNOSIS — U071 COVID-19: Secondary | ICD-10-CM | POA: Diagnosis present

## 2020-12-30 DIAGNOSIS — I313 Pericardial effusion (noninflammatory): Secondary | ICD-10-CM | POA: Diagnosis present

## 2020-12-30 DIAGNOSIS — R918 Other nonspecific abnormal finding of lung field: Secondary | ICD-10-CM | POA: Diagnosis present

## 2020-12-30 DIAGNOSIS — E1165 Type 2 diabetes mellitus with hyperglycemia: Secondary | ICD-10-CM | POA: Diagnosis present

## 2020-12-30 DIAGNOSIS — Z6833 Body mass index (BMI) 33.0-33.9, adult: Secondary | ICD-10-CM | POA: Diagnosis not present

## 2020-12-30 DIAGNOSIS — Z79899 Other long term (current) drug therapy: Secondary | ICD-10-CM

## 2020-12-30 DIAGNOSIS — Z825 Family history of asthma and other chronic lower respiratory diseases: Secondary | ICD-10-CM

## 2020-12-30 DIAGNOSIS — J1282 Pneumonia due to coronavirus disease 2019: Secondary | ICD-10-CM | POA: Diagnosis present

## 2020-12-30 DIAGNOSIS — Z833 Family history of diabetes mellitus: Secondary | ICD-10-CM

## 2020-12-30 DIAGNOSIS — J189 Pneumonia, unspecified organism: Secondary | ICD-10-CM

## 2020-12-30 DIAGNOSIS — J90 Pleural effusion, not elsewhere classified: Secondary | ICD-10-CM | POA: Diagnosis present

## 2020-12-30 DIAGNOSIS — J4540 Moderate persistent asthma, uncomplicated: Secondary | ICD-10-CM | POA: Diagnosis present

## 2020-12-30 DIAGNOSIS — E669 Obesity, unspecified: Secondary | ICD-10-CM | POA: Diagnosis present

## 2020-12-30 DIAGNOSIS — F1210 Cannabis abuse, uncomplicated: Secondary | ICD-10-CM | POA: Diagnosis present

## 2020-12-30 HISTORY — DX: Cannabis abuse, uncomplicated: F12.10

## 2020-12-30 HISTORY — DX: Atherosclerosis of aorta: I70.0

## 2020-12-30 HISTORY — DX: Other nonspecific abnormal finding of lung field: R91.8

## 2020-12-30 HISTORY — DX: Tobacco use: Z72.0

## 2020-12-30 HISTORY — DX: Disorder of teeth and supporting structures, unspecified: K08.9

## 2020-12-30 LAB — CBC WITH DIFFERENTIAL/PLATELET
Abs Immature Granulocytes: 0.05 10*3/uL (ref 0.00–0.07)
Basophils Absolute: 0.1 10*3/uL (ref 0.0–0.1)
Basophils Relative: 1 %
Eosinophils Absolute: 0.1 10*3/uL (ref 0.0–0.5)
Eosinophils Relative: 1 %
HCT: 40.8 % (ref 39.0–52.0)
Hemoglobin: 14.4 g/dL (ref 13.0–17.0)
Immature Granulocytes: 0 %
Lymphocytes Relative: 14 %
Lymphs Abs: 1.7 10*3/uL (ref 0.7–4.0)
MCH: 31.2 pg (ref 26.0–34.0)
MCHC: 35.3 g/dL (ref 30.0–36.0)
MCV: 88.5 fL (ref 80.0–100.0)
Monocytes Absolute: 0.8 10*3/uL (ref 0.1–1.0)
Monocytes Relative: 6 %
Neutro Abs: 9.6 10*3/uL — ABNORMAL HIGH (ref 1.7–7.7)
Neutrophils Relative %: 78 %
Platelets: 478 10*3/uL — ABNORMAL HIGH (ref 150–400)
RBC: 4.61 MIL/uL (ref 4.22–5.81)
RDW: 12.5 % (ref 11.5–15.5)
WBC: 12.3 10*3/uL — ABNORMAL HIGH (ref 4.0–10.5)
nRBC: 0 % (ref 0.0–0.2)

## 2020-12-30 MED ORDER — ACETAMINOPHEN 325 MG PO TABS
650.0000 mg | ORAL_TABLET | Freq: Once | ORAL | Status: AC | PRN
  Administered 2020-12-30: 650 mg via ORAL
  Filled 2020-12-30: qty 2

## 2020-12-30 NOTE — ED Triage Notes (Signed)
Pt says that he has had a cough for about 2 weeks now. Denies that he has had a fever (has a fever in triage). Last took hydrocodone around 0800 this morning. Says he went to [facility] and they told him the mass in his lung is getting bigger. Turned off O2 in triage, saturations 94-96% on RA, no acute distress.

## 2020-12-30 NOTE — ED Triage Notes (Signed)
Pt arrives via [facility] from home with c/o lung pain. Seen at [facility] for the same yesterday. Given abx and pain meds. Tonight, he took 2 hydrocodone and then was pain free, then the pain in the left lung, returned.  Breathing shallow for EMS, breath sounds clear,  82% on RA, improved to 90's on 3L 120HR, 150/90 en route. Joseph Fitzgerald

## 2020-12-31 ENCOUNTER — Emergency Department (HOSPITAL_COMMUNITY): Payer: 59

## 2020-12-31 ENCOUNTER — Encounter (HOSPITAL_COMMUNITY): Payer: Self-pay | Admitting: Internal Medicine

## 2020-12-31 ENCOUNTER — Inpatient Hospital Stay (HOSPITAL_COMMUNITY): Payer: 59

## 2020-12-31 DIAGNOSIS — J90 Pleural effusion, not elsewhere classified: Secondary | ICD-10-CM | POA: Diagnosis present

## 2020-12-31 DIAGNOSIS — Z880 Allergy status to penicillin: Secondary | ICD-10-CM | POA: Diagnosis not present

## 2020-12-31 DIAGNOSIS — J4540 Moderate persistent asthma, uncomplicated: Secondary | ICD-10-CM

## 2020-12-31 DIAGNOSIS — I313 Pericardial effusion (noninflammatory): Secondary | ICD-10-CM | POA: Diagnosis present

## 2020-12-31 DIAGNOSIS — J869 Pyothorax without fistula: Secondary | ICD-10-CM | POA: Diagnosis present

## 2020-12-31 DIAGNOSIS — I700 Atherosclerosis of aorta: Secondary | ICD-10-CM | POA: Diagnosis present

## 2020-12-31 DIAGNOSIS — Z79899 Other long term (current) drug therapy: Secondary | ICD-10-CM | POA: Diagnosis not present

## 2020-12-31 DIAGNOSIS — F1210 Cannabis abuse, uncomplicated: Secondary | ICD-10-CM | POA: Diagnosis present

## 2020-12-31 DIAGNOSIS — Z9689 Presence of other specified functional implants: Secondary | ICD-10-CM | POA: Diagnosis not present

## 2020-12-31 DIAGNOSIS — Z825 Family history of asthma and other chronic lower respiratory diseases: Secondary | ICD-10-CM | POA: Diagnosis not present

## 2020-12-31 DIAGNOSIS — Z833 Family history of diabetes mellitus: Secondary | ICD-10-CM | POA: Diagnosis not present

## 2020-12-31 DIAGNOSIS — K089 Disorder of teeth and supporting structures, unspecified: Secondary | ICD-10-CM

## 2020-12-31 DIAGNOSIS — Z720 Tobacco use: Secondary | ICD-10-CM | POA: Diagnosis present

## 2020-12-31 DIAGNOSIS — E1165 Type 2 diabetes mellitus with hyperglycemia: Secondary | ICD-10-CM | POA: Diagnosis present

## 2020-12-31 DIAGNOSIS — U071 COVID-19: Secondary | ICD-10-CM | POA: Diagnosis present

## 2020-12-31 DIAGNOSIS — J189 Pneumonia, unspecified organism: Secondary | ICD-10-CM | POA: Diagnosis not present

## 2020-12-31 DIAGNOSIS — J45909 Unspecified asthma, uncomplicated: Secondary | ICD-10-CM | POA: Insufficient documentation

## 2020-12-31 DIAGNOSIS — E669 Obesity, unspecified: Secondary | ICD-10-CM | POA: Diagnosis present

## 2020-12-31 DIAGNOSIS — J1282 Pneumonia due to coronavirus disease 2019: Secondary | ICD-10-CM | POA: Diagnosis present

## 2020-12-31 DIAGNOSIS — R918 Other nonspecific abnormal finding of lung field: Secondary | ICD-10-CM | POA: Diagnosis present

## 2020-12-31 DIAGNOSIS — Z6833 Body mass index (BMI) 33.0-33.9, adult: Secondary | ICD-10-CM | POA: Diagnosis present

## 2020-12-31 DIAGNOSIS — R7989 Other specified abnormal findings of blood chemistry: Secondary | ICD-10-CM | POA: Diagnosis not present

## 2020-12-31 DIAGNOSIS — R739 Hyperglycemia, unspecified: Secondary | ICD-10-CM | POA: Diagnosis not present

## 2020-12-31 LAB — URINALYSIS, ROUTINE W REFLEX MICROSCOPIC
Bacteria, UA: NONE SEEN
Bilirubin Urine: NEGATIVE
Glucose, UA: NEGATIVE mg/dL
Hgb urine dipstick: NEGATIVE
Ketones, ur: NEGATIVE mg/dL
Leukocytes,Ua: NEGATIVE
Nitrite: NEGATIVE
Protein, ur: NEGATIVE mg/dL
Specific Gravity, Urine: 1.042 — ABNORMAL HIGH (ref 1.005–1.030)
pH: 5 (ref 5.0–8.0)

## 2020-12-31 LAB — STREP PNEUMONIAE URINARY ANTIGEN: Strep Pneumo Urinary Antigen: NEGATIVE

## 2020-12-31 LAB — RAPID URINE DRUG SCREEN, HOSP PERFORMED
Amphetamines: NOT DETECTED
Barbiturates: NOT DETECTED
Benzodiazepines: NOT DETECTED
Cocaine: NOT DETECTED
Opiates: POSITIVE — AB
Tetrahydrocannabinol: NOT DETECTED

## 2020-12-31 LAB — COMPREHENSIVE METABOLIC PANEL
ALT: 30 U/L (ref 0–44)
AST: 29 U/L (ref 15–41)
Albumin: 2.7 g/dL — ABNORMAL LOW (ref 3.5–5.0)
Alkaline Phosphatase: 90 U/L (ref 38–126)
Anion gap: 14 (ref 5–15)
BUN: 9 mg/dL (ref 6–20)
CO2: 20 mmol/L — ABNORMAL LOW (ref 22–32)
Calcium: 8.8 mg/dL — ABNORMAL LOW (ref 8.9–10.3)
Chloride: 102 mmol/L (ref 98–111)
Creatinine, Ser: 0.83 mg/dL (ref 0.61–1.24)
GFR, Estimated: >60 mL/min (ref >60)
Glucose, Bld: 143 mg/dL — ABNORMAL HIGH (ref 70–99)
Potassium: 3.8 mmol/L (ref 3.5–5.1)
Sodium: 136 mmol/L (ref 135–145)
Total Bilirubin: 0.9 mg/dL (ref 0.3–1.2)
Total Protein: 8.3 g/dL — ABNORMAL HIGH (ref 6.5–8.1)

## 2020-12-31 LAB — LIPID PANEL
Cholesterol: 149 mg/dL (ref 0–200)
HDL: 39 mg/dL — ABNORMAL LOW (ref >40)
LDL Cholesterol: 104 mg/dL — ABNORMAL HIGH (ref 0–99)
Total CHOL/HDL Ratio: 3.8 RATIO
Triglycerides: 32 mg/dL (ref <150)
VLDL: 6 mg/dL (ref 0–40)

## 2020-12-31 LAB — BODY FLUID CELL COUNT WITH DIFFERENTIAL
Eos, Fluid: 0 %
Lymphs, Fluid: 9 %
Monocyte-Macrophage-Serous Fluid: 1 % — ABNORMAL LOW (ref 50–90)
Neutrophil Count, Fluid: 90 % — ABNORMAL HIGH (ref 0–25)
Total Nucleated Cell Count, Fluid: 4125 cu mm — ABNORMAL HIGH (ref 0–1000)

## 2020-12-31 LAB — TROPONIN I (HIGH SENSITIVITY)
Troponin I (High Sensitivity): 4 ng/L (ref <18)
Troponin I (High Sensitivity): 4 ng/L (ref <18)

## 2020-12-31 LAB — HIV ANTIBODY (ROUTINE TESTING W REFLEX): HIV Screen 4th Generation wRfx: NONREACTIVE

## 2020-12-31 LAB — GLUCOSE, PLEURAL OR PERITONEAL FLUID: Glucose, Fluid: <20 mg/dL

## 2020-12-31 LAB — LACTIC ACID, PLASMA
Lactic Acid, Venous: 1.1 mmol/L (ref 0.5–1.9)
Lactic Acid, Venous: 1.3 mmol/L (ref 0.5–1.9)

## 2020-12-31 LAB — FERRITIN: Ferritin: 407 ng/mL — ABNORMAL HIGH (ref 24–336)

## 2020-12-31 LAB — PROCALCITONIN: Procalcitonin: 1.25 ng/mL

## 2020-12-31 LAB — C-REACTIVE PROTEIN: CRP: 19.4 mg/dL — ABNORMAL HIGH (ref <1.0)

## 2020-12-31 LAB — D-DIMER, QUANTITATIVE: D-Dimer, Quant: 3.01 ug/mL-FEU — ABNORMAL HIGH (ref 0.00–0.50)

## 2020-12-31 LAB — SARS CORONAVIRUS 2 BY RT PCR (HOSPITAL ORDER, PERFORMED IN ~~LOC~~ HOSPITAL LAB): SARS Coronavirus 2: POSITIVE — AB

## 2020-12-31 LAB — LACTATE DEHYDROGENASE: LDH: 136 U/L (ref 98–192)

## 2020-12-31 LAB — FIBRINOGEN: Fibrinogen: >800 mg/dL — ABNORMAL HIGH (ref 210–475)

## 2020-12-31 MED ORDER — ONDANSETRON HCL 4 MG/2ML IJ SOLN: 4.0000 mg | Freq: Four times a day (QID) | INTRAMUSCULAR | Status: DC | PRN

## 2020-12-31 MED ORDER — SODIUM CHLORIDE 0.9 % IV SOLN
200.0000 mg | Freq: Once | INTRAVENOUS | Status: AC
  Administered 2020-12-31: 200 mg via INTRAVENOUS
  Filled 2020-12-31: qty 40

## 2020-12-31 MED ORDER — ALBUTEROL SULFATE HFA 108 (90 BASE) MCG/ACT IN AERS
2.0000 | INHALATION_SPRAY | RESPIRATORY_TRACT | Status: DC | PRN
Start: 2020-12-31 — End: 2021-01-09
  Administered 2021-01-08: 2 via RESPIRATORY_TRACT
  Filled 2020-12-31: qty 6.7

## 2020-12-31 MED ORDER — BENZONATATE 100 MG PO CAPS
200.0000 mg | ORAL_CAPSULE | Freq: Three times a day (TID) | ORAL | Status: DC | PRN
  Administered 2021-01-01 – 2021-01-05 (×2): 200 mg via ORAL
  Filled 2020-12-31 (×2): qty 2

## 2020-12-31 MED ORDER — IBUPROFEN 400 MG PO TABS
400.0000 mg | ORAL_TABLET | Freq: Four times a day (QID) | ORAL | Status: DC | PRN
  Administered 2020-12-31: 400 mg via ORAL
  Filled 2020-12-31: qty 1

## 2020-12-31 MED ORDER — FENTANYL CITRATE (PF) 100 MCG/2ML IJ SOLN
25.0000 ug | Freq: Once | INTRAMUSCULAR | Status: AC
  Administered 2020-12-31: 25 ug via INTRAVENOUS
  Filled 2020-12-31: qty 2

## 2020-12-31 MED ORDER — ASCORBIC ACID 500 MG PO TABS
500.0000 mg | ORAL_TABLET | Freq: Every day | ORAL | Status: DC
  Administered 2020-12-31 – 2021-01-09 (×10): 500 mg via ORAL
  Filled 2020-12-31 (×10): qty 1

## 2020-12-31 MED ORDER — ZINC SULFATE 220 (50 ZN) MG PO CAPS
220.0000 mg | ORAL_CAPSULE | Freq: Every day | ORAL | Status: DC
  Administered 2020-12-31 – 2021-01-09 (×10): 220 mg via ORAL
  Filled 2020-12-31 (×10): qty 1

## 2020-12-31 MED ORDER — ATORVASTATIN CALCIUM 10 MG PO TABS
10.0000 mg | ORAL_TABLET | Freq: Every day | ORAL | Status: DC
  Administered 2020-12-31 – 2021-01-09 (×10): 10 mg via ORAL
  Filled 2020-12-31 (×10): qty 1

## 2020-12-31 MED ORDER — MIDAZOLAM HCL 2 MG/2ML IJ SOLN
1.0000 mg | Freq: Once | INTRAMUSCULAR | Status: AC
  Administered 2020-12-31: 1 mg via INTRAVENOUS
  Filled 2020-12-31: qty 2

## 2020-12-31 MED ORDER — ACETAMINOPHEN 325 MG PO TABS: 650.0000 mg | ORAL_TABLET | Freq: Four times a day (QID) | ORAL | Status: DC | PRN

## 2020-12-31 MED ORDER — LEVOFLOXACIN IN D5W 500 MG/100ML IV SOLN
500.0000 mg | Freq: Once | INTRAVENOUS | Status: DC
  Filled 2020-12-31: qty 100

## 2020-12-31 MED ORDER — OXYCODONE HCL 5 MG PO TABS
5.0000 mg | ORAL_TABLET | ORAL | Status: DC | PRN
  Administered 2020-12-31 – 2021-01-02 (×4): 5 mg via ORAL
  Filled 2020-12-31 (×4): qty 1

## 2020-12-31 MED ORDER — SODIUM CHLORIDE 0.9 % IV SOLN
100.0000 mg | Freq: Every day | INTRAVENOUS | Status: AC
  Administered 2021-01-01 – 2021-01-02 (×2): 100 mg via INTRAVENOUS
  Filled 2020-12-31 (×2): qty 20

## 2020-12-31 MED ORDER — IOHEXOL 350 MG/ML SOLN
100.0000 mL | Freq: Once | INTRAVENOUS | Status: AC | PRN
  Administered 2020-12-31: 100 mL via INTRAVENOUS

## 2020-12-31 MED ORDER — FLUTICASONE FUROATE-VILANTEROL 200-25 MCG/INH IN AEPB
1.0000 | INHALATION_SPRAY | Freq: Every day | RESPIRATORY_TRACT | Status: DC
  Administered 2021-01-01 – 2021-01-08 (×5): 1 via RESPIRATORY_TRACT
  Filled 2020-12-31: qty 28

## 2020-12-31 MED ORDER — ENOXAPARIN SODIUM 40 MG/0.4ML ~~LOC~~ SOLN
40.0000 mg | SUBCUTANEOUS | Status: DC
  Administered 2020-12-31 – 2021-01-09 (×10): 40 mg via SUBCUTANEOUS
  Filled 2020-12-31 (×10): qty 0.4

## 2020-12-31 MED ORDER — LEVOFLOXACIN 750 MG PO TABS
750.0000 mg | ORAL_TABLET | Freq: Every day | ORAL | Status: DC
  Administered 2020-12-31 – 2021-01-01 (×2): 750 mg via ORAL
  Filled 2020-12-31 (×2): qty 1

## 2020-12-31 MED ORDER — SODIUM CHLORIDE 0.9 % IV SOLN: INTRAVENOUS | Status: DC

## 2020-12-31 MED ORDER — FLUTICASONE PROPIONATE 50 MCG/ACT NA SUSP
2.0000 | Freq: Every day | NASAL | Status: DC
  Administered 2021-01-01 – 2021-01-08 (×4): 2 via NASAL
  Filled 2020-12-31: qty 16

## 2020-12-31 MED ORDER — SODIUM CHLORIDE 0.9% FLUSH
10.0000 mL | Freq: Three times a day (TID) | INTRAVENOUS | Status: DC
  Administered 2020-12-31 – 2021-01-09 (×24): 10 mL

## 2020-12-31 MED ORDER — SODIUM CHLORIDE 0.9 % IV BOLUS
1000.0000 mL | Freq: Once | INTRAVENOUS | Status: AC
  Administered 2020-12-31: 1000 mL via INTRAVENOUS

## 2020-12-31 MED ORDER — KETOROLAC TROMETHAMINE 15 MG/ML IJ SOLN
15.0000 mg | Freq: Once | INTRAMUSCULAR | Status: AC
  Administered 2020-12-31: 15 mg via INTRAVENOUS
  Filled 2020-12-31: qty 1

## 2020-12-31 MED ORDER — MORPHINE SULFATE (PF) 2 MG/ML IV SOLN
2.0000 mg | INTRAVENOUS | Status: DC | PRN
  Administered 2020-12-31 – 2021-01-01 (×2): 2 mg via INTRAVENOUS
  Filled 2020-12-31 (×2): qty 1

## 2020-12-31 NOTE — ED Provider Notes (Signed)
[facility] Emergency Department Provider Note MRN:  [id number]  Arrival date & time: 12/31/20     Chief Complaint   Chest pain History of Present Illness   Joseph Fitzgerald is a 50 y.o. year-old male with a history of asthma presenting to the ED with chief complaint of chest pain.  Location: Left side of the chest Duration: 1 or 2 days Onset: Sudden Timing: Constant Description: Sharp Severity: Moderate Exacerbating/Alleviating Factors: Worse with deep breathing Associated Symptoms: Fever, cough Pertinent Negatives: Denies abdominal pain, no leg pain or swelling   Review of Systems  A complete 10 system review of systems was obtained and all systems are negative except as noted in the HPI and PMH.   Patient's Health History    Past Medical History:  Diagnosis Date  . Asthma   . Shortness of breath dyspnea     Past Surgical History:  Procedure Laterality Date  . NO PAST SURGERIES      Family History  Problem Relation Age of Onset  . Asthma Brother   . Diabetes Brother     Social History   Socioeconomic History  . Marital status: Single    Spouse name: Not on file  . Number of children: 3  . Years of education: 105  . Highest education level: Not on file  Occupational History  . Occupation: Cook   Tobacco Use  . Smoking status: Never Smoker  . Smokeless tobacco: Current User    Types: Chew  Vaping Use  . Vaping Use: Never used  Substance and Sexual Activity  . Alcohol use: Yes    Comment: beer  . Drug use: No  . Sexual activity: Yes  Other Topics Concern  . Not on file  Social History Narrative  . Not on file   Social Determinants of Health   Financial Resource Strain: Not on file  Food Insecurity: Not on file  Transportation Needs: Not on file  Physical Activity: Not on file  Stress: Not on file  Social Connections: Not on file  Intimate Partner Violence: Not on file     Physical Exam   Vitals:   12/31/20 0600  12/31/20 0630  BP: 119/80 107/75  Pulse: (!) 106 97  Resp: 14 (!) 22  Temp:    SpO2: 97% 99%    CONSTITUTIONAL: Well-appearing, NAD NEURO:  Alert and oriented x 3, no focal deficits EYES:  eyes equal and reactive ENT/NECK:  no LAD, no JVD CARDIO: Regular rate, well-perfused, normal S1 and S2 PULM:  CTAB no wheezing or rhonchi GI/GU:  normal bowel sounds, non-distended, non-tender MSK/SPINE:  No gross deformities, no edema SKIN:  no rash, atraumatic PSYCH:  Appropriate speech and behavior  *Additional and/or pertinent findings included in MDM below  Diagnostic and Interventional Summary    EKG Interpretation  Date/Time:  Wednesday December 30 2020 22:46:29 EST Ventricular Rate:  130 PR Interval:  122 QRS Duration: 78 QT Interval:  304 QTC Calculation: 447 R Axis:   69 Text Interpretation: Sinus tachycardia ST & T wave abnormality, consider inferior ischemia Abnormal ECG Confirmed by Gerlene Fee [phone]) on 12/31/2020 5:11:59 AM      Labs Reviewed  SARS CORONAVIRUS 2 BY RT PCR (HOSPITAL ORDER, [facility]) - Abnormal; Notable for the following components:      Result Value   SARS Coronavirus 2 POSITIVE (*)    All other components within normal limits  COMPREHENSIVE METABOLIC PANEL - Abnormal; Notable for the  following components:   CO2 20 (*)    Glucose, Bld 143 (*)    Calcium 8.8 (*)    Total Protein 8.3 (*)    Albumin 2.7 (*)    All other components within normal limits  CBC WITH DIFFERENTIAL/PLATELET - Abnormal; Notable for the following components:   WBC 12.3 (*)    Platelets 478 (*)    Neutro Abs 9.6 (*)    All other components within normal limits  LACTIC ACID, PLASMA  LACTIC ACID, PLASMA  URINALYSIS, ROUTINE W REFLEX MICROSCOPIC  TROPONIN I (HIGH SENSITIVITY)  TROPONIN I (HIGH SENSITIVITY)    CT ANGIO CHEST PE W OR WO CONTRAST  Final Result      Medications  levofloxacin (LEVAQUIN) IVPB 500 mg (has no administration in time  range)  acetaminophen (TYLENOL) tablet 650 mg (650 mg Oral Given 12/30/20 2253)  ketorolac (TORADOL) 15 MG/ML injection 15 mg (15 mg Intravenous Given 12/31/20 0537)  fentaNYL (SUBLIMAZE) injection 25 mcg (25 mcg Intravenous Given 12/31/20 0539)  sodium chloride 0.9 % bolus 1,000 mL (1,000 mLs Intravenous New Bag/Given 12/31/20 0537)  iohexol (OMNIPAQUE) 350 MG/ML injection 100 mL (100 mLs Intravenous Contrast Given 12/31/20 0558)     Procedures  /  Critical Care Procedures  ED Course and Medical Decision Making  I have reviewed the triage vital signs, the nursing notes, and pertinent available records from the EMR.  Listed above are laboratory and imaging tests that I personally ordered, reviewed, and interpreted and then considered in my medical decision making (see below for details).  Sharp pleuritic chest pain, has tested positive for COVID-19.  Also with recent imaging suggesting a possible pulmonary mass.  Given the possibility of underlying cancer, the active COVID-19, and the EKG changes seen here today, will need CTA to exclude PE.     CT is without PE, does reveal a complex pneumonia with possible focus of necrosis and/or empyema.  Will admit to medicine.  Barth Kirks. Sedonia Small, MD [place] [email or web address]  Final Clinical Impressions(s) / ED Diagnoses     ICD-10-CM   1. Recurrent pneumonia  J18.9     ED Discharge Orders    None       Discharge Instructions Discussed with and Provided to Patient:   Discharge Instructions   None       Maudie Flakes, MD 12/31/20 [phone]

## 2020-12-31 NOTE — ED Notes (Signed)
Pts wife requests updates. [phone]

## 2020-12-31 NOTE — Consult Note (Signed)
NAME:  Joseph Fitzgerald, MRN:  [id number], DOB:  Aug 03, 1971, LOS: 0 ADMISSION DATE:  12/30/2020, CONSULTATION DATE:  2/3 REFERRING MD:  Lorin Mercy, CHIEF COMPLAINT:  Chest pain   Brief History:  50 y/o male who has been followed in the pulmonology clinic for a left lower lobe mass felt to be of infectious etiology presented to the [facility] emergency department on January 01, 2020 complaining of left chest pain.  Noted to have an increased size of the mass with an associated pleural effusion.  History of Present Illness:  This is a 50 year old male with minimal past medical history who was initially seen in the pulmonology clinic in late 2021 after he developed left-sided chest pain and was seen in urgent care.  He had a chest x-ray which showed a mass with likely cavitation.  He was treated with oral antibiotics for over a month and in January 2022 he was noted to have decreasing size of the mass and symptoms had improved.  Of note, the patient has very poor dentition, previously had no dental insurance and had pulled most of his teeth by himself when he had dental problems.  However on December 29, 2020 he presented to urgent care with a complaint of left-sided chest pain again.  He was treated with antibiotics, chest x-ray showed increasing size of the mass and he was referred back to pulmonology.  Plans are being made for bronchoscopic evaluation.  He returned to the [facility] emergency department on December 31, 2020 complaining of left-sided chest pain, worsening dyspnea, noted to be febrile.  The dyspnea and chest pain started on Sunday January 30.  A CT angiogram chest was performed which showed increasing size of the necrotic mass and a moderate size loculated pleural effusion.  Cigarette smoking> noted in his history but he denies when I ask.  He was found to be COVID positive.  He states that he had his first vaccine on 1/15, doesn't know the brand.  He has had symptoms of headache and mild body aches.     Past Medical History:  Smoker? Poor dentition Mass of lower lobe of left lung Marijuana abuse Asthma Aortic atherosclerosis  Significant Hospital Events:    Consults:  PCCM  Procedures:    Significant Diagnostic Tests:  2/3 CT Angiogram chest > right lung without evidence of pulmonary parenchymal abnormality, left lung with large left lower lobe mass, and cavitation, partially loculated anterior fissure effusion in 2 separate locations: 1 in the left base and a separate in the superior thorax, bulky adenopathy noted in the subcarinal, left pretracheal, and left hilar areas.  Micro Data:  2/3 sars cov 2 > positive 2/3 blood >   Antimicrobials:  2/3 levaquin >  2/3 remdesivir >  Interim History / Subjective:  As above  Objective   Blood pressure 100/75, pulse 80, temperature 98.5 F (36.9 C), temperature source Oral, resp. rate (!) 24, SpO2 97 %.        Intake/Output Summary (Last 24 hours) at 12/31/2020 0851 Last data filed at 12/31/2020 0845 Gross per 24 hour  Intake 1000 ml  Output 400 ml  Net 600 ml   There were no vitals filed for this visit.  Examination: General:  Resting comfortably in bed HENT: NCAT OP clear PULM: Diminished left base, clear on right B, normal effort CV: RRR, no mgr GI: BS+, soft, nontender MSK: normal bulk and tone Neuro: awake, alert, no distress, MAEW   Resolved Hospital Problem list  Assessment & Plan:  Cavitary pneumonia with associate pleural effusion in a male with poor dentition: Favor infectious etiology, now with likely empyema though malignancy is possible. > will make arrangements for a left chest tube > would cover with anaerobic coverage (unasyn) for now > once chest tube is placed will send for afb, fungal, bacterial culture  COVID 19: he doesn't have COVID pneumonia, partially vaccinated > as I think he is probably a smoker and has the appearance of now chronic lung disease would favor treating with  remdesivir for 3 days to prevent a complication from COVID  Lung mass/cavitary pneumonia > bronchoscopy, EBUS, BAL, tissue biopsy would be helpful, but will start with sending the pleural fluid for analysis first   Best practice (evaluated daily)   Per [facility]  Goals of Care:   Per [facility]  Labs   CBC: Recent Labs  Lab 12/30/20 2254  WBC 12.3*  NEUTROABS 9.6*  HGB 14.4  HCT 40.8  MCV 88.5  PLT 478*    Basic Metabolic Panel: Recent Labs  Lab 12/30/20 2254  NA 136  K 3.8  CL 102  CO2 20*  GLUCOSE 143*  BUN 9  CREATININE 0.83  CALCIUM 8.8*   GFR: CrCl cannot be calculated (Unknown ideal weight.). Recent Labs  Lab 12/30/20 2253 12/30/20 2254 12/31/20 0549  WBC  --  12.3*  --   LATICACIDVEN 1.1  --  1.3    Liver Function Tests: Recent Labs  Lab 12/30/20 2254  AST 29  ALT 30  ALKPHOS 90  BILITOT 0.9  PROT 8.3*  ALBUMIN 2.7*   No results for input(s): LIPASE, AMYLASE in the last 168 hours. No results for input(s): AMMONIA in the last 168 hours.  ABG    Component Value Date/Time   TCO2 21 (L) 03/17/2018 0342     Coagulation Profile: No results for input(s): INR, PROTIME in the last 168 hours.  Cardiac Enzymes: No results for input(s): CKTOTAL, CKMB, CKMBINDEX, TROPONINI in the last 168 hours.  HbA1C: HbA1c POC (<> result, manual entry)  Date/Time Value Ref Range Status  10/17/2019 03:00 PM 5.4 4.0 - 5.6 % Final   Hgb A1c MFr Bld  Date/Time Value Ref Range Status  03/17/2018 03:34 AM 5.1 4.8 - 5.6 % Final    Comment:    (NOTE) Pre diabetes:          5.7%-6.4% Diabetes:              >6.4% Glycemic control for   <7.0% adults with diabetes   08/14/2016 08:09 AM 5.4 4.8 - 5.6 % Final    Comment:    (NOTE)         Pre-diabetes: 5.7 - 6.4         Diabetes: >6.4         Glycemic control for adults with diabetes: <7.0     CBG: No results for input(s): GLUCAP in the last 168 hours.  Review of Systems:   Gen: per HPI HEENT: Denies blurred  vision, double vision, hearing loss, tinnitus, sinus congestion, rhinorrhea, sore throat, neck stiffness, dysphagia PULM: per HPI CV: Denies chest pain, edema, orthopnea, paroxysmal nocturnal dyspnea, palpitations GI: Denies abdominal pain, nausea, vomiting, diarrhea, hematochezia, melena, constipation, change in bowel habits GU: Denies dysuria, hematuria, polyuria, oliguria, urethral discharge Endocrine: Denies hot or cold intolerance, polyuria, polyphagia or appetite change Derm: Denies rash, dry skin, scaling or peeling skin change Heme: Denies easy bruising, bleeding, bleeding gums Neuro: Denies headache, numbness,  weakness, slurred speech, loss of memory or consciousness   Past Medical History:  He,  has a past medical history of Aortic atherosclerosis ([place]), Asthma, Marijuana abuse, Mass of lower lobe of left lung, Poor dentition, and Tobacco abuse.   Surgical History:   Past Surgical History:  Procedure Laterality Date  . NO PAST SURGERIES       Social History:   reports that he has never smoked. His smokeless tobacco use includes chew. He reports current alcohol use. He reports that he does not use drugs.   Family History:  His family history includes Asthma in his brother; Diabetes in his brother.   Allergies Allergies  Allergen Reactions  . Penicillins Rash    Did it involve swelling of the face/tongue/throat, SOB, or low BP?No Did it involve sudden or severe rash/hives, skin peeling, or any reaction on the inside of your mouth or nose?Yes Did you need to seek medical attention at a hospital or doctor's office? no When did it last happen?Child If all above answers are "NO", may proceed with cephalosporin use.     Home Medications  Prior to Admission medications   Medication Sig Start Date End Date Taking? Authorizing Provider  albuterol (VENTOLIN HFA) 108 (90 Base) MCG/ACT inhaler Inhale 2 puffs into the lungs every 4 (four) hours as needed for wheezing or  shortness of breath. 07/17/20   Camillia Herter, NP  benzonatate (TESSALON) 200 MG capsule Take 1 capsule (200 mg total) by mouth 3 (three) times daily as needed for up to 7 days for cough. 12/29/20 01/05/21  Wieters, Hallie C, PA-C  fluticasone (FLONASE) 50 MCG/ACT nasal spray SHAKE LIQUID AND USE 2 SPRAYS IN [facility] NOSTRIL DAILY 10/26/20   Nicolette Bang, DO  fluticasone furoate-vilanterol (BREO ELLIPTA) 200-25 MCG/INH AEPB Inhale 1 puff into the lungs daily. 09/03/20   Ladell Pier, MD  HYDROcodone-acetaminophen (NORCO/VICODIN) 5-325 MG tablet Take 1-2 tablets by mouth every 6 (six) hours as needed for severe pain. 12/29/20   Wieters, Hallie C, PA-C  ipratropium-albuterol (DUONEB) 0.5-2.5 (3) MG/3ML SOLN Take 3 mLs by nebulization every 6 (six) hours. 09/10/19   Black, Lezlie Octave, NP  levofloxacin (LEVAQUIN) 750 MG tablet Take 1 tablet (750 mg total) by mouth daily. 12/29/20   Janith Lima, PA-C     Critical care time: n/a    Roselie Awkward, MD [place] [facility] Pager: [phone] Cell: [phone] If no response, call [phone]

## 2020-12-31 NOTE — Procedures (Signed)
Insertion of Chest Tube Procedure Note  Joseph Fitzgerald  [id number]  1971-04-30  Date:12/31/20  Time:11:30 AM    Provider Performing: Kipp Brood   Procedure: Pleural Catheter Insertion w/ Imaging Guidance [phone])  Indication(s) Effusion  Consent Risks of the procedure as well as the alternatives and risks of each were explained to the patient and/or caregiver.  Consent for the procedure was obtained and is signed in the bedside chart  Anesthesia Topical only with 1% lidocaine . Anxiolysis with fentanyl 67mg, versed 143m  Time Out Verified patient identification, verified procedure, site/side was marked, verified correct patient position, special equipment/implants available, medications/allergies/relevant history reviewed, required imaging and test results available.   Sterile Technique Maximal sterile technique including full sterile barrier drape, hand hygiene, sterile gown, sterile gloves, mask, hair covering, sterile ultrasound probe cover (if used).   Procedure Description Ultrasound used to identify appropriate pleural anatomy for placement and overlying skin marked. Area of placement cleaned and draped in sterile fashion.  A 14 French pigtail pleural catheter was placed into the left pleural space using Seldinger technique. Appropriate return of fluid was obtained.  The tube was connected to atrium and placed on -20 cm H2O wall suction.  A suitable pocket of fluid was identified in the left chest in the mid-scapular line consistent with location of largest pocket on CT. Depth to middle of pocket estimated at 5cm. 50 cm of dark, blood-tinged turbid fluid was drained and sent for analysis. Needle position was again confirmed by aspirating fluid prior to insertion of guidewire over which pigtail was advanced to last mark and brought out to skin. Continues to drain fluid, but no air leak noted.   Complications/Tolerance None; patient tolerated the procedure well. Chest X-ray  confirms placement in lower left pleural space. No pneumothorax seen.   EBL Minimal  Specimen(s) fluid   RaKipp BroodMD FRSouthcoast Hospitals Group - St. Luke'S HospitalCU Physician CHIndian Springs VillagePager: [phone] Epic Secure Chat   12/31/2020, 11:39 AM

## 2020-12-31 NOTE — H&P (Signed)
History and Physical    Joseph Fitzgerald DXA:[id number] DOB: Jul 10, 1971 DOA: 12/30/2020  PCP: Ladell Pier, MD Consultants:  [facility] - pulmonology Patient coming from:  Home - lives with wife and 3 sons (17, 36, 8); NOK: Wife, Johnanna Schneiders, [phone]  Chief Complaint:  Lung pain  HPI: Joseph Fitzgerald is a 50 y.o. male with medical history significant of asthma; poor dentition; aortic atherosclerosis on CT; and a lung mass presenting with cough and lung pain.  He has been undergoing pulmonary evaluation for lung mass - infection thought to be more likely than malignancy but evaluation is still ongoing.  It appeared to be improving with antibiotics, previously treated with Augmentin and Azithromycin.  He has had cough for several weeks and developed acute L lower chest pain with SOB and inability to take a deep breath and so went to UC on 2/1.  Imaging at that time indicated a worsening opacity with effusion and so he was treated with Levaquin (took one dose on 2/2) and symptomatic care with Tessalon and hydrocodone.  He was due to f/u with Dr. Valeta Harms next week and so he ordered a Super D CT for prior to the visit.  However, in the late evening on 2/2, the pain recurred and seemed to be worsening.  He was 82% on RA and was placed on 3L with EMS and brought in.  He is currently comfortable-appearing on room air with normal O2 sats in the high 90s.    ED Course: Carryover, per Dr. Tonie Griffith:  50 yo male. Covid positive. Has loculated complex effusion on left with possible necrotic area vs empyema. Will need pulmonology to evaluate.   Review of Systems: As per HPI; otherwise review of systems reviewed and negative.   Ambulatory Status:  Ambulates without assistance  COVID Vaccine Status:   First shot recently; due for second shot on 2/15  Past Medical History:  Diagnosis Date  . Aortic atherosclerosis ([place])   . Asthma   . Marijuana abuse   . Mass of lower lobe of left lung   . Poor dentition    . Tobacco abuse     Past Surgical History:  Procedure Laterality Date  . NO PAST SURGERIES      Social History   Socioeconomic History  . Marital status: Single    Spouse name: Not on file  . Number of children: 3  . Years of education: 14  . Highest education level: Not on file  Occupational History  . Occupation: Cook   Tobacco Use  . Smoking status: Never Smoker  . Smokeless tobacco: Former Systems developer    Types: Secondary school teacher  . Vaping Use: Never used  Substance and Sexual Activity  . Alcohol use: Not Currently    Comment: beer  . Drug use: Not Currently    Types: Marijuana  . Sexual activity: Yes  Other Topics Concern  . Not on file  Social History Narrative  . Not on file   Social Determinants of Health   Financial Resource Strain: Not on file  Food Insecurity: Not on file  Transportation Needs: Not on file  Physical Activity: Not on file  Stress: Not on file  Social Connections: Not on file  Intimate Partner Violence: Not on file    Allergies  Allergen Reactions  . Penicillins Rash    Did it involve swelling of the face/tongue/throat, SOB, or low BP?No Did it involve sudden or severe rash/hives, skin peeling, or any reaction on the  inside of your mouth or nose?Yes Did you need to seek medical attention at a hospital or doctor's office? no When did it last happen?Child If all above answers are "NO", may proceed with cephalosporin use.    Family History  Problem Relation Age of Onset  . Asthma Brother   . Diabetes Brother     Prior to Admission medications   Medication Sig Start Date End Date Taking? Authorizing Provider  albuterol (VENTOLIN HFA) 108 (90 Base) MCG/ACT inhaler Inhale 2 puffs into the lungs every 4 (four) hours as needed for wheezing or shortness of breath. 07/17/20   Camillia Herter, NP  benzonatate (TESSALON) 200 MG capsule Take 1 capsule (200 mg total) by mouth 3 (three) times daily as needed for up to 7 days for cough. 12/29/20  01/05/21  Wieters, Hallie C, PA-C  fluticasone (FLONASE) 50 MCG/ACT nasal spray SHAKE LIQUID AND USE 2 SPRAYS IN [facility] NOSTRIL DAILY 10/26/20   Nicolette Bang, DO  fluticasone furoate-vilanterol (BREO ELLIPTA) 200-25 MCG/INH AEPB Inhale 1 puff into the lungs daily. 09/03/20   Ladell Pier, MD  HYDROcodone-acetaminophen (NORCO/VICODIN) 5-325 MG tablet Take 1-2 tablets by mouth every 6 (six) hours as needed for severe pain. 12/29/20   Wieters, Hallie C, PA-C  ipratropium-albuterol (DUONEB) 0.5-2.5 (3) MG/3ML SOLN Take 3 mLs by nebulization every 6 (six) hours. 09/10/19   Black, Lezlie Octave, NP  levofloxacin (LEVAQUIN) 750 MG tablet Take 1 tablet (750 mg total) by mouth daily. 12/29/20   Janith Lima, PA-C    Physical Exam: Vitals:   12/31/20 0745 12/31/20 0800 12/31/20 0815 12/31/20 0830  BP: 103/73 99/71 118/81 100/75  Pulse: 82 79 91 80  Resp: (!) 24 (!) 21 (!) 23 (!) 24  Temp:      TempSrc:      SpO2: 97% 97% 98% 97%     . General:  Appears calm and comfortable and is in NAD, on RA . Eyes:  PERRL, EOMI, normal lids, iris . ENT:  grossly normal hearing, lips & tongue, mmm; very poor dentition . Neck:  no LAD, masses or thyromegaly . Cardiovascular:  RRR, no m/r/g. No LE edema.  Marland Kitchen Respiratory:   Diffuse rhonchi with diminished air movement in the LLL.  Mildly increased respiratory effort. . Abdomen:  soft, NT, ND . Back:   normal alignment, no CVAT . Skin:  no rash or induration seen on limited exam . Musculoskeletal:  grossly normal tone BUE/BLE, good ROM, no bony abnormality . Psychiatric:  blunted mood and affect, speech fluent and appropriate, AOx3, limited mildly by apparent language barrier . Neurologic:  CN 2-12 grossly intact, moves all extremities in coordinated fashion    Radiological Exams on Admission: Independently reviewed - see discussion in A/P where applicable  DG Chest 2 View  Result Date: 12/29/2020 CLINICAL DATA:  Cough. Increasing left lower rib  pain. Mass 2 left lower lung. EXAM: CHEST - 2 VIEW COMPARISON:  Radiograph December 13 21.  CT chest 12/01/2020. FINDINGS: Persistent masslike opacity in the left lower lobe, which has increased since 11/09/2020 radiograph. Direct comparison with the 12/01/2020 CT chest is difficult given differences in modality. New small left pleural effusion. No visible pneumothorax. Cardiomediastinal silhouette is within normal limits. No evidence of acute osseous abnormality. IMPRESSION: Persistent masslike opacity in the left lower lobe with new small left pleural effusion. The opacity is increased relative to prior radiograph from 11/09/2020. Comparison with recent CT chest is limited given differences in modality. Differential  considerations remain pneumonia versus malignancy and recommend follow-up chest CT as directed from the 12/01/2020 CT chest. Electronically Signed   By: Margaretha Sheffield MD   On: 12/29/2020 13:22   CT ANGIO CHEST PE W OR WO CONTRAST  Result Date: 12/31/2020 CLINICAL DATA:  Shortness of breath EXAM: CT ANGIOGRAPHY CHEST WITH CONTRAST TECHNIQUE: Multidetector CT imaging of the chest was performed using the standard protocol during bolus administration of intravenous contrast. Multiplanar CT image reconstructions and MIPs were obtained to evaluate the vascular anatomy. CONTRAST:  159m OMNIPAQUE IOHEXOL 350 MG/ML SOLN COMPARISON:  CT 12/01/2020, 10/14/2020 FINDINGS: Cardiovascular: Satisfactory opacification the pulmonary arteries to the segmental level. No pulmonary artery filling defects are identified. Central pulmonary arteries are top-normal caliber. Cardiac size is top normal. No sizable pericardial effusion. There is however some inflammation in the adjacent paracardial/mediastinal fat which is likely secondary to the it extensive process in the left lung. The aorta is normal caliber. No acute luminal abnormality of the imaged aorta. No periaortic stranding or hemorrhage. Normal 3 vessel  branching of the aortic arch. Proximal great vessels are unremarkable. No major venous abnormalities are seen. Mediastinum/Nodes: Stranding and thickening in the left anterior mediastinal/paracardial fat. No mediastinal free fluid or air. Normal thyroid gland and thoracic inlet. No acute abnormality of the trachea or esophagus. Scattered low-attenuation subcentimeter mediastinal and hilar adenopathy. Several larger subcarinal and left hilar nodes are present measuring up to, 1.5 cm and 1.2 cm respectively (7/189), quite similar to prior. There is some increasing left axillary adenopathy is present as well with some mild surrounding stranding which could reflect a reactive process. Lungs/Pleura: Increasing consolidative opacity centered in the left lower with hypoattenuation of the consolidated parenchyma suggestive of some underlying infection with scattered air lucencies, possibly developing cavitation (6/77). Development of a complex, loculated pleural effusion extending over the lung apex, into the fissures, and throughout the lung base. Suspect some reactive stranding of the adjacent mediastinal fat as detailed above. Several areas suggest some associated pleural thickening which could reflect a developing empyema. Right lung remains essentially clear without visible right pleural effusion. No pneumothorax. Upper Abdomen: No acute abnormalities present in the visualized portions of the upper abdomen. Musculoskeletal: No acute osseous abnormality or suspicious osseous lesion. Multilevel degenerative changes are present in the imaged portions of the spine. No clear involvement of the chest wall. No erosive or destructive changes of the adjacent ribs. No worrisome chest wall masses or lesions. Review of the MIP images confirms the above findings. IMPRESSION: 1. No pulmonary embolism is seen. 2. Increasing consolidative opacity centered in the left lower with hypoattenuation of the consolidated parenchyma suggestive  of some underlying infection with scattered air lucencies, possibly developing cavitation or necrosis. Additional development of a complex, loculated pleural effusion extending over the lung apex, into the fissure, and throughout the lung base. Several areas suggest some associated pleural thickening/empyema. Continued follow-up to resolution is recommended as an underlying mass/malignancy is difficult to fully exclude. 3. Some increasing scattered subcentimeter hypoattenuating mediastinal, hilar and left axillary adenopathy, may be reactive though continued attention on follow-up imaging is warranted pending exclusion of malignancy. These results were called by telephone at the time of interpretation on 12/31/2020 at 6:16 am to provider [facility], who verbally acknowledged these results. Electronically Signed   By: PLovena LeM.D.   On: 12/31/2020 06:17    EKG: Independently reviewed.  Sinus tachycardia with rate 130; nonspecific ST changes with no evidence of acute ischemia  Labs on Admission: I have personally reviewed the available labs and imaging studies at the time of the admission.  Pertinent labs:   Glucose 143 Albumin 2.7 HS troponin 4 Lactate 1.3 WBC 12.3 Platelets 478   Assessment/Plan Principal Problem:   Loculated pleural effusion Active Problems:   Asthma, moderate persistent   Hyperglycemia   COVID-19 virus infection   Poor dentition   Aortic atherosclerosis ([facility])   Mass of lower lobe of left lung   Marijuana abuse   Tobacco abuse    Loculated pleural effusion with underlying LLL lung mass -Patient has been followed for this L lung mass, possibly cavitary - infection vs. malignancy - since 10/01/20 -It has seemed to improve with antibiotics but has recurred -Most recent plan was for Super D CT for further evaluation -He is being followed by Dr. Valeta Harms -Meanwhile, he developed L-sided pleuritic CP and went to UC and then came to the ER -He has been on Levaquin  but has only taken 1 dose - will continue with 750 mg PO Levaquin daily for now -CT today also demonstrates a loculated pleural effusion -Likely needs IR pigtail with pleural fluid analysis -Will consult pulmonology for further assistance -Continue supportive care with Albuterol, Tessalon -Will admit to Med Surg for further evaluation and treatment -He is currently on RA -Will check quantiferon gold and AFB  COVID-19 -Patient was also found to be positive for COVID -At this time, it appears that this is an incidental COVID infection - his only symptoms appear to be related to the issue in his left lung, which preceded the COVID -He has only completed 1 immunization -No current O2 requirement -COVID POSITIVE -Will check COVID labs -Will order Remdesivir (pharmacy consult) given +COVID test for incidental COVID infection - unless pulmonology feels differently  Asthma -Continue Flonase, Breo Ellipta, and prn Albuterol -Hold Duonebs given COVID -No apparent wheezing at this time  Poor dentition -It is possible that this is the source of his recurrent cavitary PNA -If this is thought to be the case, he likely needs outpatient referral to dentist for extraction  Aortic atherosclerosis seen on CT -No apparent h/o ASCVD -Incidental finding on CT -However, by current recommendations the patient should be started on statin therapy -Will start Lipitor 10 mg daily -Will check lipids -He may benefit from f/u in the lipids clinic  Marijuana abuse -Cessation encouraged; he reports last use in November and cessation since onset of issues -UDS ordered  Oral tobacco abuse -Also reports cessation of oral tobacco in November  Hyperglycemia -He appears to have been previously diagnosed with DM, but I do not appreciate an A1c >5.4 in Epic -He is not on medications -For now, will follow with fasting labs and no further treatment -If steroids are needed, he may need SSI     Level of care:  Med-Surg DVT prophylaxis:  Lovenox  Code Status:  Full  Family Communication: None present; I spoke with the patient's wife by telephone at the time of admission. Disposition Plan:  The patient is from: home  Anticipated d/c is to: home without [facility] services   Anticipated d/c date will depend on clinical response to treatment, likely 2-3 days   Patient is currently: acutely ill Consults called: Pulmonology  Admission status:  Admit - It is my clinical opinion that admission to INPATIENT is reasonable and necessary because of the expectation that this patient will require hospital care that crosses at least 2 midnights to treat this condition based on  the medical complexity of the problems presented.  Given the aforementioned information, the predictability of an adverse outcome is felt to be significant.    Karmen Bongo MD [facility]   How to contact the [facility] Attending or Consulting provider [place] or covering provider during after hours [place], for this patient?  1. Check the care team in [facility] and look for a) attending/consulting [facility] provider listed and b) the [facility] team listed 2. Log into [email or web address] and use 's universal password to access. If you do not have the password, please contact the hospital operator. 3. Locate the [facility] provider you are looking for under [facility] and page to a number that you can be directly reached. 4. If you still have difficulty reaching the provider, please page the [facility] (Director on Call) for the Hospitalists listed on amion for assistance.   12/31/2020, 9:01 AM

## 2020-12-31 NOTE — ED Notes (Signed)
Patient transported to CT 

## 2021-01-01 ENCOUNTER — Inpatient Hospital Stay (HOSPITAL_COMMUNITY): Payer: 59

## 2021-01-01 DIAGNOSIS — J90 Pleural effusion, not elsewhere classified: Secondary | ICD-10-CM | POA: Diagnosis not present

## 2021-01-01 LAB — COMPREHENSIVE METABOLIC PANEL
ALT: 24 U/L (ref 0–44)
AST: 19 U/L (ref 15–41)
Albumin: 2.3 g/dL — ABNORMAL LOW (ref 3.5–5.0)
Alkaline Phosphatase: 87 U/L (ref 38–126)
Anion gap: 10 (ref 5–15)
BUN: 9 mg/dL (ref 6–20)
CO2: 25 mmol/L (ref 22–32)
Calcium: 8.4 mg/dL — ABNORMAL LOW (ref 8.9–10.3)
Chloride: 101 mmol/L (ref 98–111)
Creatinine, Ser: 1 mg/dL (ref 0.61–1.24)
GFR, Estimated: >60 mL/min (ref >60)
Glucose, Bld: 111 mg/dL — ABNORMAL HIGH (ref 70–99)
Potassium: 3.8 mmol/L (ref 3.5–5.1)
Sodium: 136 mmol/L (ref 135–145)
Total Bilirubin: 1.3 mg/dL — ABNORMAL HIGH (ref 0.3–1.2)
Total Protein: 7.4 g/dL (ref 6.5–8.1)

## 2021-01-01 LAB — D-DIMER, QUANTITATIVE: D-Dimer, Quant: 3.41 ug/mL-FEU — ABNORMAL HIGH (ref 0.00–0.50)

## 2021-01-01 LAB — ACID FAST SMEAR (AFB, MYCOBACTERIA): Acid Fast Smear: NEGATIVE

## 2021-01-01 LAB — CBC WITH DIFFERENTIAL/PLATELET
Abs Immature Granulocytes: 0.1 10*3/uL — ABNORMAL HIGH (ref 0.00–0.07)
Basophils Absolute: 0.1 10*3/uL (ref 0.0–0.1)
Basophils Relative: 0 %
Eosinophils Absolute: 0.1 10*3/uL (ref 0.0–0.5)
Eosinophils Relative: 0 %
HCT: 41 % (ref 39.0–52.0)
Hemoglobin: 13.3 g/dL (ref 13.0–17.0)
Immature Granulocytes: 1 %
Lymphocytes Relative: 7 %
Lymphs Abs: 1.1 10*3/uL (ref 0.7–4.0)
MCH: 29.1 pg (ref 26.0–34.0)
MCHC: 32.4 g/dL (ref 30.0–36.0)
MCV: 89.7 fL (ref 80.0–100.0)
Monocytes Absolute: 1.7 10*3/uL — ABNORMAL HIGH (ref 0.1–1.0)
Monocytes Relative: 10 %
Neutro Abs: 13.7 10*3/uL — ABNORMAL HIGH (ref 1.7–7.7)
Neutrophils Relative %: 82 %
Platelets: 383 10*3/uL (ref 150–400)
RBC: 4.57 MIL/uL (ref 4.22–5.81)
RDW: 12.7 % (ref 11.5–15.5)
WBC: 16.6 10*3/uL — ABNORMAL HIGH (ref 4.0–10.5)
nRBC: 0 % (ref 0.0–0.2)

## 2021-01-01 LAB — PH, BODY FLUID: pH, Body Fluid: 6.7

## 2021-01-01 LAB — PHOSPHORUS: Phosphorus: 1.9 mg/dL — ABNORMAL LOW (ref 2.5–4.6)

## 2021-01-01 LAB — MAGNESIUM: Magnesium: 1.8 mg/dL (ref 1.7–2.4)

## 2021-01-01 LAB — FERRITIN: Ferritin: 424 ng/mL — ABNORMAL HIGH (ref 24–336)

## 2021-01-01 LAB — C-REACTIVE PROTEIN: CRP: 31.7 mg/dL — ABNORMAL HIGH (ref <1.0)

## 2021-01-01 LAB — CYTOLOGY - NON PAP

## 2021-01-01 MED ORDER — K PHOS MONO-SOD PHOS DI & MONO 155-852-130 MG PO TABS
500.0000 mg | ORAL_TABLET | Freq: Three times a day (TID) | ORAL | Status: AC
  Administered 2021-01-01 (×3): 500 mg via ORAL
  Filled 2021-01-01 (×2): qty 2

## 2021-01-01 MED ORDER — SODIUM CHLORIDE 0.9 % IV SOLN
2.0000 g | INTRAVENOUS | Status: DC
  Administered 2021-01-01 – 2021-01-07 (×7): 2 g via INTRAVENOUS
  Filled 2021-01-01 (×8): qty 20

## 2021-01-01 MED ORDER — SODIUM CHLORIDE (PF) 0.9 % IJ SOLN
10.0000 mg | Freq: Every day | INTRAMUSCULAR | Status: AC
  Administered 2021-01-01 – 2021-01-06 (×6): 10 mg via INTRAPLEURAL
  Filled 2021-01-01 (×10): qty 10

## 2021-01-01 MED ORDER — STERILE WATER FOR INJECTION IJ SOLN
5.0000 mg | Freq: Every day | RESPIRATORY_TRACT | Status: AC
  Administered 2021-01-01 – 2021-01-06 (×6): 5 mg via INTRAPLEURAL
  Filled 2021-01-01 (×9): qty 5

## 2021-01-01 MED ORDER — METRONIDAZOLE 500 MG PO TABS
500.0000 mg | ORAL_TABLET | Freq: Three times a day (TID) | ORAL | Status: DC
  Administered 2021-01-01 – 2021-01-09 (×24): 500 mg via ORAL
  Filled 2021-01-01 (×24): qty 1

## 2021-01-01 MED ORDER — MAGNESIUM SULFATE 2 GM/50ML IV SOLN
2.0000 g | Freq: Once | INTRAVENOUS | Status: AC
  Administered 2021-01-01: 2 g via INTRAVENOUS
  Filled 2021-01-01: qty 50

## 2021-01-01 NOTE — Progress Notes (Signed)
Paged ON coverage, V. Rathore: [phone] regarding chest tube. Output has increased to 10100m of pink/red output from 769mof serous output since change of shift. Pt experiencing 8/10 pain - not relieved by PO 34m37mxy or IV morphine.

## 2021-01-01 NOTE — Procedures (Signed)
Pleural Fibrinolytic Administration Procedure Note  Elsworth Ledin  [id number]  June 29, 1971  Date:01/01/21  Time:6:33 PM   Provider Performing:Amauris Debois C Tamala Julian   Procedure: Pleural Fibrinolysis Initial day [id number]  Indication(s) Fibrinolysis of complicated pleural effusion  Consent Risks of the procedure as well as the alternatives and risks of each were explained to the patient and/or caregiver.  Consent for the procedure was obtained.   Anesthesia None   Time Out Verified patient identification, verified procedure, site/side was marked, verified correct patient position, special equipment/implants available, medications/allergies/relevant history reviewed, required imaging and test results available.   Sterile Technique Hand hygiene, gloves   Procedure Description Existing pleural catheter was cleaned and accessed in sterile manner.  63m of tPA in 30cc of saline and 537mof dornase in 30cc of sterile water were injected into pleural space using existing pleural catheter.  Catheter will be clamped for 1 hour and then placed back to suction.   Complications/Tolerance None; patient tolerated the procedure well.  EBL None   Specimen(s) None

## 2021-01-01 NOTE — Consult Note (Signed)
NAME:  Joseph Fitzgerald, MRN:  [id number], DOB:  1971/10/17, LOS: 1 ADMISSION DATE:  12/30/2020, CONSULTATION DATE:  2/3 REFERRING MD:  Lorin Mercy, CHIEF COMPLAINT:  Chest pain   Brief History:  50 y/o male who has been followed in the pulmonology clinic for a left lower lobe mass felt to be of infectious etiology presented to the [facility] emergency department on January 01, 2020 complaining of left chest pain.  Noted to have an increased size of the mass with an associated pleural effusion.  History of Present Illness:  This is a 50 year old male with minimal past medical history who was initially seen in the pulmonology clinic in late 2021 after he developed left-sided chest pain and was seen in urgent care.  He had a chest x-ray which showed a mass with likely cavitation.  He was treated with oral antibiotics for over a month and in January 2022 he was noted to have decreasing size of the mass and symptoms had improved.  Of note, the patient has very poor dentition, previously had no dental insurance and had pulled most of his teeth by himself when he had dental problems.  However on December 29, 2020 he presented to urgent care with a complaint of left-sided chest pain again.  He was treated with antibiotics, chest x-ray showed increasing size of the mass and he was referred back to pulmonology.  Plans are being made for bronchoscopic evaluation.  He returned to the [facility] emergency department on December 31, 2020 complaining of left-sided chest pain, worsening dyspnea, noted to be febrile.  The dyspnea and chest pain started on Sunday January 30.  A CT angiogram chest was performed which showed increasing size of the necrotic mass and a moderate size loculated pleural effusion.  Cigarette smoking> noted in his history but he denies when I ask.  He was found to be COVID positive.  He states that he had his first vaccine on 1/15, doesn't know the brand.  He has had symptoms of headache and mild body aches.     Past Medical History:  Smoker? Poor dentition Mass of lower lobe of left lung Marijuana abuse Asthma Aortic atherosclerosis  Significant Hospital Events:    Consults:  PCCM  Procedures:    Significant Diagnostic Tests:  2/3 CT Angiogram chest > right lung without evidence of pulmonary parenchymal abnormality, left lung with large left lower lobe mass, and cavitation, partially loculated anterior fissure effusion in 2 separate locations: 1 in the left base and a separate in the superior thorax, bulky adenopathy noted in the subcarinal, left pretracheal, and left hilar areas.  Micro Data:  2/3 sars cov 2 > positive 2/3 blood >   Antimicrobials:  2/3 levaquin >  2/3 remdesivir >  Interim History / Subjective:  NAEON, minimal CT output, CXR unchanged  Objective   Blood pressure 115/86, pulse 89, temperature 98.9 F (37.2 C), temperature source Oral, resp. rate 18, height 5' (1.524 m), weight 76.5 kg, SpO2 99 %.        Intake/Output Summary (Last 24 hours) at 01/01/2021 1745 Last data filed at 01/01/2021 1500 Gross per 24 hour  Intake 1854.99 ml  Output 1375 ml  Net 479.99 ml   Filed Weights   12/31/20 1530  Weight: 76.5 kg    Examination: Did not examine  Resolved Hospital Problem list     Assessment & Plan:  Cavitary pneumonia with associate pleural effusion in a male with poor dentition: Favor infectious etiology, now with  likely empyema though malignancy is possible. Pleural fluid studies consistent with inflammatory process. --s/p pigtail 2/3 --Unasyn --follow cultures (NGTD) --Lytics ordered, plan to give 1st dose PM 2/4   Best practice (evaluated daily)   Per [facility]  Goals of Care:   Per [facility]  Labs   CBC: Recent Labs  Lab 12/30/20 2254 01/01/21 0025  WBC 12.3* 16.6*  NEUTROABS 9.6* 13.7*  HGB 14.4 13.3  HCT 40.8 41.0  MCV 88.5 89.7  PLT 478* 771    Basic Metabolic Panel: Recent Labs  Lab 12/30/20 2254 01/01/21 0025  NA 136 136   K 3.8 3.8  CL 102 101  CO2 20* 25  GLUCOSE 143* 111*  BUN 9 9  CREATININE 0.83 1.00  CALCIUM 8.8* 8.4*  MG  --  1.8  PHOS  --  1.9*   GFR: Estimated Creatinine Clearance: 78.3 mL/min (by C-G formula based on SCr of 1 mg/dL). Recent Labs  Lab 12/30/20 2253 12/30/20 2254 12/31/20 0549 12/31/20 0906 01/01/21 0025  PROCALCITON  --   --   --  1.25  --   WBC  --  12.3*  --   --  16.6*  LATICACIDVEN 1.1  --  1.3  --   --     Liver Function Tests: Recent Labs  Lab 12/30/20 2254 01/01/21 0025  AST 29 19  ALT 30 24  ALKPHOS 90 87  BILITOT 0.9 1.3*  PROT 8.3* 7.4  ALBUMIN 2.7* 2.3*   No results for input(s): LIPASE, AMYLASE in the last 168 hours. No results for input(s): AMMONIA in the last 168 hours.  ABG    Component Value Date/Time   TCO2 21 (L) 03/17/2018 0342     Coagulation Profile: No results for input(s): INR, PROTIME in the last 168 hours.  Cardiac Enzymes: No results for input(s): CKTOTAL, CKMB, CKMBINDEX, TROPONINI in the last 168 hours.  HbA1C: HbA1c POC (<> result, manual entry)  Date/Time Value Ref Range Status  10/17/2019 03:00 PM 5.4 4.0 - 5.6 % Final   Hgb A1c MFr Bld  Date/Time Value Ref Range Status  03/17/2018 03:34 AM 5.1 4.8 - 5.6 % Final    Comment:    (NOTE) Pre diabetes:          5.7%-6.4% Diabetes:              >6.4% Glycemic control for   <7.0% adults with diabetes   08/14/2016 08:09 AM 5.4 4.8 - 5.6 % Final    Comment:    (NOTE)         Pre-diabetes: 5.7 - 6.4         Diabetes: >6.4         Glycemic control for adults with diabetes: <7.0     CBG: No results for input(s): GLUCAP in the last 168 hours.    Critical care time: n/a    Lanier Clam, MD

## 2021-01-01 NOTE — Progress Notes (Signed)
PROGRESS NOTE                                                                                                                                                                                                             Patient Demographics:    Joseph Fitzgerald, is a 50 y.o. male, DOB - [birth year], ZWC:[id number]  Outpatient Primary MD for the patient is Ladell Pier, MD   Admit date - 12/30/2020   LOS - 1  No chief complaint on file.      Brief Narrative: Patient is a 50 y.o. male who has been followed in the pulmonology clinic for a left lower lung mass-presented to the ED on 2/3 with left-sided chest pain-found to have increasing left lower lobe mass and a loculated pleural effusion.  Evaluated by PCCM-chest tube placed-and subsequently admitted to the hospitalist service.  See below for further details.  COVID-19 vaccinated status: Partially vaccinated  Significant Events: 2/4>> Admit to [facility] for loculated pleural effusion/increasing left lower lobe mass.  Incidental COVID  Significant studies: 2/3>> CTA chest: No PE, complex/loculated pleural effusion-increased left lower lobe mass  COVID-19 medications: Remdesivir: 2/3>>  Antibiotics: Levofloxacin: 2/3>> 2/4 Rocephin: 2/4>> Flagyl: 2/4>>  Microbiology data: 2/3 >>blood culture: Pending 2/3>> pleural fluid culture/fungal culture/AFB culture: Pending  Procedures: 2/3>> chest tube placement by PCCM  Consults: PCCM  DVT prophylaxis: enoxaparin (LOVENOX) injection 40 mg Start: 12/31/20 0845    Subjective:    Joseph Fitzgerald today denies any chest pain or shortness of breath.  Lying comfortably in bed.   Assessment  & Plan :   Left cavitary mass with complicated/loculated pleural effusion-likely empyema: Chest tube in place-PCCM following-antibiotics as above-await culture data and further recommendations from PCCM  Poor dentition  COVID-19 infection:  Incidental-asymptomatic-Remdesivir x3 days.  COVID-19 Labs: Recent Labs    12/31/20 0906 01/01/21 0025  DDIMER 3.01* 3.41*  FERRITIN 407* 424*  LDH 136  --   CRP 19.4* 31.7*    No results found for: BNP  Recent Labs  Lab 12/31/20 0906  PROCALCITON 1.25    Lab Results  Component Value Date   SARSCOV2NAA POSITIVE (A) 12/30/2020   [place] Not Detected 10/01/2020   [place] NEGATIVE 09/08/2019     Obesity: Estimated body mass index is 32.94 kg/m as calculated from the following:   Height as of  this encounter: 5' (1.524 m).   Weight as of this encounter: 76.5 kg.     ABG:    Component Value Date/Time   TCO2 21 (L) 03/17/2018 0342    Vent Settings: N/A    Condition - Stable  Family Communication  :    Code Status :  Full Code  Diet :  Diet Order            Diet regular Room service appropriate? Yes; Fluid consistency: Thin  Diet effective now                  Disposition Plan  :   Status is: Inpatient  Remains inpatient appropriate because:Inpatient level of care appropriate due to severity of illness   Dispo: The patient is from: Home              Anticipated d/c is to: Home              Anticipated d/c date is: > 3 days              Patient currently is not medically stable to d/c.   Difficult to place patient No   Barriers to discharge: Chest tube in place-on IV antibiotics-May require bronchoscopy/CT surgery referral.   Antimicorbials  :    Anti-infectives (From admission, onward)   Start     Dose/Rate Route Frequency Ordered Stop   01/01/21 1000  remdesivir 100 mg in sodium chloride 0.9 % 100 mL IVPB       "Followed by" Linked Group Details   100 mg 200 mL/hr over 30 Minutes Intravenous Daily 12/31/20 0841 01/03/21 0959   12/31/20 1000  levofloxacin (LEVAQUIN) tablet 750 mg        750 mg Oral Daily 12/31/20 0841     12/31/20 1000  remdesivir 200 mg in sodium chloride 0.9% 250 mL IVPB       "Followed by" Linked Group Details    200 mg 580 mL/hr over 30 Minutes Intravenous Once 12/31/20 0841 12/31/20 1256   12/31/20 0630  levofloxacin (LEVAQUIN) IVPB 500 mg  Status:  Discontinued        500 mg 100 mL/hr over 60 Minutes Intravenous  Once 12/31/20 5009 12/31/20 0841      Inpatient Medications  Scheduled Meds: . vitamin C  500 mg Oral Daily  . atorvastatin  10 mg Oral Daily  . enoxaparin (LOVENOX) injection  40 mg Subcutaneous Q24H  . fluticasone  2 spray Each Nare Daily  . fluticasone furoate-vilanterol  1 puff Inhalation Daily  . levofloxacin  750 mg Oral Daily  . phosphorus  500 mg Oral TID  . sodium chloride flush  10 mL Intracatheter Q8H  . zinc sulfate  220 mg Oral Daily   Continuous Infusions: . sodium chloride 75 mL/hr at 01/01/21 0413  . magnesium sulfate bolus IVPB    . remdesivir 100 mg in NS 100 mL     PRN Meds:.acetaminophen, albuterol, benzonatate, ibuprofen, morphine injection, ondansetron (ZOFRAN) IV, oxyCODONE   Time Spent in minutes  25  See all Orders from today for further details   Oren Binet M.D on 01/01/2021 at 10:08 AM  To page go to [email or web address] - use universal password  [facility] -  Office  [phone]    Objective:   Vitals:   12/31/20 2029 01/01/21 0002 01/01/21 0402 01/01/21 0800  BP: 132/80 127/84 107/71 111/76  Pulse: 97 89 100 97  Resp: (!) 21 (!) 22 20 18  Temp: 98.7 F (37.1 C) 98.9 F (37.2 C) 98.9 F (37.2 C) 98.7 F (37.1 C)  TempSrc: Axillary Axillary Axillary Axillary  SpO2: 97% 95% 95% 96%  Weight:      Height:        Wt Readings from Last 3 Encounters:  12/31/20 76.5 kg  12/07/20 76.5 kg  11/13/20 76.7 kg     Intake/Output Summary (Last 24 hours) at 01/01/2021 1008 Last data filed at 01/01/2021 0600 Gross per 24 hour  Intake 1654.99 ml  Output 715 ml  Net 939.99 ml     Physical Exam Gen Exam:Alert awake-not in any distress HEENT:atraumatic, normocephalic Chest: B/L clear to auscultation anteriorly CVS:S1S2  regular Abdomen:soft non tender, non distended Extremities:no edema Neurology: Non focal Skin: no rash   Data Review:    CBC Recent Labs  Lab 12/30/20 2254 01/01/21 0025  WBC 12.3* 16.6*  HGB 14.4 13.3  HCT 40.8 41.0  PLT 478* 383  MCV 88.5 89.7  MCH 31.2 29.1  MCHC 35.3 32.4  RDW 12.5 12.7  LYMPHSABS 1.7 1.1  MONOABS 0.8 1.7*  EOSABS 0.1 0.1  BASOSABS 0.1 0.1    Chemistries  Recent Labs  Lab 12/30/20 2254 01/01/21 0025  NA 136 136  K 3.8 3.8  CL 102 101  CO2 20* 25  GLUCOSE 143* 111*  BUN 9 9  CREATININE 0.83 1.00  CALCIUM 8.8* 8.4*  MG  --  1.8  AST 29 19  ALT 30 24  ALKPHOS 90 87  BILITOT 0.9 1.3*   ------------------------------------------------------------------------------------------------------------------ Recent Labs    12/31/20 0906  CHOL 149  HDL 39*  LDLCALC 104*  TRIG 32  CHOLHDL 3.8    Lab Results  Component Value Date   HGBA1C 5.4 10/17/2019   ------------------------------------------------------------------------------------------------------------------ No results for input(s): TSH, T4TOTAL, T3FREE, THYROIDAB in the last 72 hours.  Invalid input(s): FREET3 ------------------------------------------------------------------------------------------------------------------ Recent Labs    12/31/20 0906 01/01/21 0025  FERRITIN 407* 424*    Coagulation profile No results for input(s): INR, PROTIME in the last 168 hours.  Recent Labs    12/31/20 0906 01/01/21 0025  DDIMER 3.01* 3.41*    Cardiac Enzymes No results for input(s): CKMB, TROPONINI, MYOGLOBIN in the last 168 hours.  Invalid input(s): CK ------------------------------------------------------------------------------------------------------------------ No results found for: BNP  Micro Results Recent Results (from the past 240 hour(s))  SARS Coronavirus 2 by RT PCR (hospital order, performed in [facility] hospital lab) Nasopharyngeal Nasopharyngeal Swab      Status: Abnormal   Collection Time: 12/30/20 10:50 PM   Specimen: Nasopharyngeal Swab  Result Value Ref Range Status   SARS Coronavirus 2 POSITIVE (A) NEGATIVE Final    Comment: RESULT CALLED TO, READ BACK BY AND VERIFIED WITH: C CRISCO RN 12/31/20 0044 JDW (NOTE) SARS-CoV-2 target nucleic acids are DETECTED  SARS-CoV-2 RNA is generally detectable in upper respiratory specimens  during the acute phase of infection.  Positive results are indicative  of the presence of the identified virus, but do not rule out bacterial infection or co-infection with other pathogens not detected by the test.  Clinical correlation with patient history and  other diagnostic information is necessary to determine patient infection status.  The expected result is negative.  Fact Sheet for Patients:   StrictlyIdeas.no   Fact Sheet for Healthcare Providers:   BankingDealers.co.za    This test is not yet approved or cleared by the [place] FDA and  has been authorized for detection and/or diagnosis of SARS-CoV-2 by FDA under  an Emergency Use Authorization (EUA).  This EUA will remain in effect (meaning this test can  be used) for the duration of  the COVID-19 declaration under Section 564(b)(1) of the Act, 21 U.S.C. section 360-bbb-3(b)(1), unless the authorization is terminated or revoked sooner.  Performed at [facility], [place] [address], [place], [place] [address]   Body fluid culture (includes gram stain)     Status: None (Preliminary result)   Collection Time: 12/31/20 11:18 AM   Specimen: Pleural Fluid  Result Value Ref Range Status   Specimen Description PLEURAL FLUID  Final   Special Requests NONE  Final   Gram Stain   Final    ABUNDANT WBC PRESENT,BOTH PMN AND MONONUCLEAR NO ORGANISMS SEEN    Culture   Final    NO GROWTH < 24 HOURS Performed at [facility], [place] [address], [place], [place] [address]    Report Status PENDING   Incomplete    Radiology Reports DG Chest 2 View  Result Date: 12/29/2020 CLINICAL DATA:  Cough. Increasing left lower rib pain. Mass 2 left lower lung. EXAM: CHEST - 2 VIEW COMPARISON:  Radiograph December 13 21.  CT chest 12/01/2020. FINDINGS: Persistent masslike opacity in the left lower lobe, which has increased since 11/09/2020 radiograph. Direct comparison with the 12/01/2020 CT chest is difficult given differences in modality. New small left pleural effusion. No visible pneumothorax. Cardiomediastinal silhouette is within normal limits. No evidence of acute osseous abnormality. IMPRESSION: Persistent masslike opacity in the left lower lobe with new small left pleural effusion. The opacity is increased relative to prior radiograph from 11/09/2020. Comparison with recent CT chest is limited given differences in modality. Differential considerations remain pneumonia versus malignancy and recommend follow-up chest CT as directed from the 12/01/2020 CT chest. Electronically Signed   By: Margaretha Sheffield MD   On: 12/29/2020 13:22   CT ANGIO CHEST PE W OR WO CONTRAST  Result Date: 12/31/2020 CLINICAL DATA:  Shortness of breath EXAM: CT ANGIOGRAPHY CHEST WITH CONTRAST TECHNIQUE: Multidetector CT imaging of the chest was performed using the standard protocol during bolus administration of intravenous contrast. Multiplanar CT image reconstructions and MIPs were obtained to evaluate the vascular anatomy. CONTRAST:  134m OMNIPAQUE IOHEXOL 350 MG/ML SOLN COMPARISON:  CT 12/01/2020, 10/14/2020 FINDINGS: Cardiovascular: Satisfactory opacification the pulmonary arteries to the segmental level. No pulmonary artery filling defects are identified. Central pulmonary arteries are top-normal caliber. Cardiac size is top normal. No sizable pericardial effusion. There is however some inflammation in the adjacent paracardial/mediastinal fat which is likely secondary to the it extensive process in the left lung. The aorta is  normal caliber. No acute luminal abnormality of the imaged aorta. No periaortic stranding or hemorrhage. Normal 3 vessel branching of the aortic arch. Proximal great vessels are unremarkable. No major venous abnormalities are seen. Mediastinum/Nodes: Stranding and thickening in the left anterior mediastinal/paracardial fat. No mediastinal free fluid or air. Normal thyroid gland and thoracic inlet. No acute abnormality of the trachea or esophagus. Scattered low-attenuation subcentimeter mediastinal and hilar adenopathy. Several larger subcarinal and left hilar nodes are present measuring up to, 1.5 cm and 1.2 cm respectively (7/189), quite similar to prior. There is some increasing left axillary adenopathy is present as well with some mild surrounding stranding which could reflect a reactive process. Lungs/Pleura: Increasing consolidative opacity centered in the left lower with hypoattenuation of the consolidated parenchyma suggestive of some underlying infection with scattered air lucencies, possibly developing cavitation (6/77). Development of a  complex, loculated pleural effusion extending over the lung apex, into the fissures, and throughout the lung base. Suspect some reactive stranding of the adjacent mediastinal fat as detailed above. Several areas suggest some associated pleural thickening which could reflect a developing empyema. Right lung remains essentially clear without visible right pleural effusion. No pneumothorax. Upper Abdomen: No acute abnormalities present in the visualized portions of the upper abdomen. Musculoskeletal: No acute osseous abnormality or suspicious osseous lesion. Multilevel degenerative changes are present in the imaged portions of the spine. No clear involvement of the chest wall. No erosive or destructive changes of the adjacent ribs. No worrisome chest wall masses or lesions. Review of the MIP images confirms the above findings. IMPRESSION: 1. No pulmonary embolism is seen. 2.  Increasing consolidative opacity centered in the left lower with hypoattenuation of the consolidated parenchyma suggestive of some underlying infection with scattered air lucencies, possibly developing cavitation or necrosis. Additional development of a complex, loculated pleural effusion extending over the lung apex, into the fissure, and throughout the lung base. Several areas suggest some associated pleural thickening/empyema. Continued follow-up to resolution is recommended as an underlying mass/malignancy is difficult to fully exclude. 3. Some increasing scattered subcentimeter hypoattenuating mediastinal, hilar and left axillary adenopathy, may be reactive though continued attention on follow-up imaging is warranted pending exclusion of malignancy. These results were called by telephone at the time of interpretation on 12/31/2020 at 6:16 am to provider [facility] , who verbally acknowledged these results. Electronically Signed   By: Lovena Le M.D.   On: 12/31/2020 06:17   DG CHEST PORT 1 VIEW  Result Date: 01/01/2021 CLINICAL DATA:  Pleural effusion, chest tube, COVID pneumonia EXAM: PORTABLE CHEST 1 VIEW COMPARISON:  12/31/2020 FINDINGS: Large, partially laterally loculated left pleural effusion has enlarged in size. Left basilar pigtail chest tube positioned inferomedially is unchanged. Interval development of mild mediastinal shift to the right. Right lung is clear. No pneumothorax. No pleural effusion on the right. Cardiac size within normal limits. IMPRESSION: Left chest tube unchanged. Progressive enlargement of a partially loculated left pleural effusion with developing mediastinal shift to the right. CT examination may be helpful to assess catheter position in relation to the developing pleural fluid. Electronically Signed   By: Fidela Salisbury MD   On: 01/01/2021 08:15   DG CHEST PORT 1 VIEW  Result Date: 12/31/2020 CLINICAL DATA:  Loculated pleural effusion. EXAM: PORTABLE CHEST 1 VIEW  COMPARISON:  CT 12/31/2020.  Chest x-ray 12/29/2020. FINDINGS: Left chest tube noted over the left lower chest. Prominent left pleural effusion with possible loculation. Underlying left base atelectasis/infiltrate most likely present. No pneumothorax. Heart size stable. IMPRESSION: Left chest tube noted over the left lower chest. Prominent left pleural effusion with possible loculation. Underlying left base atelectasis/infiltrate most likely present. No pneumothorax. Electronically Signed   By: Marcello Moores  Register   On: 12/31/2020 11:43

## 2021-01-01 NOTE — Progress Notes (Signed)
Pt was started on levaquin likely d/t a listed PCN allergy issue. It's reported as a rash. D/w Dr. Sloan Leiter, we will change to ceftriaxone/flagyl.  Onnie Boer, PharmD, BCIDP, AAHIVP, CPP Infectious Disease Pharmacist 01/01/2021 10:54 AM

## 2021-01-02 ENCOUNTER — Inpatient Hospital Stay (HOSPITAL_COMMUNITY): Payer: 59

## 2021-01-02 DIAGNOSIS — J189 Pneumonia, unspecified organism: Secondary | ICD-10-CM

## 2021-01-02 DIAGNOSIS — R739 Hyperglycemia, unspecified: Secondary | ICD-10-CM

## 2021-01-02 LAB — CBC WITH DIFFERENTIAL/PLATELET
Abs Immature Granulocytes: 0.14 10*3/uL — ABNORMAL HIGH (ref 0.00–0.07)
Basophils Absolute: 0.1 10*3/uL (ref 0.0–0.1)
Basophils Relative: 0 %
Eosinophils Absolute: 0 10*3/uL (ref 0.0–0.5)
Eosinophils Relative: 0 %
HCT: 36.3 % — ABNORMAL LOW (ref 39.0–52.0)
Hemoglobin: 11.8 g/dL — ABNORMAL LOW (ref 13.0–17.0)
Immature Granulocytes: 1 %
Lymphocytes Relative: 7 %
Lymphs Abs: 1.3 10*3/uL (ref 0.7–4.0)
MCH: 28.9 pg (ref 26.0–34.0)
MCHC: 32.5 g/dL (ref 30.0–36.0)
MCV: 89 fL (ref 80.0–100.0)
Monocytes Absolute: 1.7 10*3/uL — ABNORMAL HIGH (ref 0.1–1.0)
Monocytes Relative: 9 %
Neutro Abs: 16.7 10*3/uL — ABNORMAL HIGH (ref 1.7–7.7)
Neutrophils Relative %: 83 %
Platelets: 319 10*3/uL (ref 150–400)
RBC: 4.08 MIL/uL — ABNORMAL LOW (ref 4.22–5.81)
RDW: 12.9 % (ref 11.5–15.5)
WBC: 20 10*3/uL — ABNORMAL HIGH (ref 4.0–10.5)
nRBC: 0 % (ref 0.0–0.2)

## 2021-01-02 LAB — COMPREHENSIVE METABOLIC PANEL
ALT: 17 U/L (ref 0–44)
AST: 15 U/L (ref 15–41)
Albumin: 1.8 g/dL — ABNORMAL LOW (ref 3.5–5.0)
Alkaline Phosphatase: 64 U/L (ref 38–126)
Anion gap: 9 (ref 5–15)
BUN: 9 mg/dL (ref 6–20)
CO2: 24 mmol/L (ref 22–32)
Calcium: 8 mg/dL — ABNORMAL LOW (ref 8.9–10.3)
Chloride: 101 mmol/L (ref 98–111)
Creatinine, Ser: 0.88 mg/dL (ref 0.61–1.24)
GFR, Estimated: >60 mL/min (ref >60)
Glucose, Bld: 116 mg/dL — ABNORMAL HIGH (ref 70–99)
Potassium: 3.6 mmol/L (ref 3.5–5.1)
Sodium: 134 mmol/L — ABNORMAL LOW (ref 135–145)
Total Bilirubin: 0.7 mg/dL (ref 0.3–1.2)
Total Protein: 5.9 g/dL — ABNORMAL LOW (ref 6.5–8.1)

## 2021-01-02 LAB — C-REACTIVE PROTEIN: CRP: 28.1 mg/dL — ABNORMAL HIGH (ref <1.0)

## 2021-01-02 LAB — FERRITIN: Ferritin: 527 ng/mL — ABNORMAL HIGH (ref 24–336)

## 2021-01-02 LAB — PHOSPHORUS: Phosphorus: 2.8 mg/dL (ref 2.5–4.6)

## 2021-01-02 LAB — MAGNESIUM: Magnesium: 2 mg/dL (ref 1.7–2.4)

## 2021-01-02 LAB — D-DIMER, QUANTITATIVE: D-Dimer, Quant: 3.58 ug/mL-FEU — ABNORMAL HIGH (ref 0.00–0.50)

## 2021-01-02 MED ORDER — IBUPROFEN 400 MG PO TABS: 400.0000 mg | ORAL_TABLET | Freq: Four times a day (QID) | ORAL | Status: DC | PRN

## 2021-01-02 NOTE — Progress Notes (Signed)
PROGRESS NOTE                                                                                                                                                                                                             Patient Demographics:    Joseph Fitzgerald, is a 50 y.o. male, DOB - [birth year], NWG:[id number]  Outpatient Primary MD for the patient is Ladell Pier, MD   Admit date - 12/30/2020   LOS - 2  No chief complaint on file.      Brief Narrative: Patient is a 50 y.o. male who has been followed in the pulmonology clinic for a left lower lung mass-presented to the ED on 2/3 with left-sided chest pain-found to have increasing left lower lobe mass and a loculated pleural effusion.  Evaluated by PCCM-chest tube placed-and subsequently admitted to the hospitalist service.  See below for further details.  COVID-19 vaccinated status: Partially vaccinated  Significant Events: 2/4>> Admit to [facility] for loculated pleural effusion/increasing left lower lobe mass.  Incidental COVID  Significant studies: 2/3>> CTA chest: No PE, complex/loculated pleural effusion-increased left lower lobe mass  COVID-19 medications: Remdesivir: 2/3>> 2/5  Antibiotics: Levofloxacin: 2/3>> 2/4 Rocephin: 2/4>> Flagyl: 2/4>>  Microbiology data: 2/3 >>blood culture: No growth 2/3>> Gold Quant front: Pending 2/3>> pleural fluid AFB smear: Negative 2/3>> pleural fluid culture: Negative 2/3>> pleural fluid fungal culture/AFB culture: Pending  Procedures: 2/3>> chest tube placement by PCCM  Consults: PCCM  DVT prophylaxis: enoxaparin (LOVENOX) injection 40 mg Start: 12/31/20 0845    Subjective:   No major issues overnight-chest tube in place.  Denies any chest pain or shortness of breath.   Assessment  & Plan :   Left cavitary mass with complicated/loculated pleural effusion-likely empyema: Chest tube in place-antibiotics as  above-culture data as above-awaiting further recommendations from PCCM.   Poor dentition  COVID-19 infection: Incidental-asymptomatic-Remdesivir x3 days.  On 12/5.  Apart from watchful observation no further treatment required at this time.  Needs isolation for a total of 10 days from 2/2.  COVID-19 Labs: Recent Labs    12/31/20 0906 01/01/21 0025 01/02/21 0401  DDIMER 3.01* 3.41* 3.58*  FERRITIN 407* 424* 527*  LDH 136  --   --   CRP 19.4* 31.7* 28.1*    No results found for: BNP  Recent Labs  Lab 12/31/20 0906  PROCALCITON 1.25    Lab Results  Component Value Date   SARSCOV2NAA POSITIVE (A) 12/30/2020   [place] Not Detected 10/01/2020   [place] NEGATIVE 09/08/2019     Obesity: Estimated body mass index is 33.15 kg/m as calculated from the following:   Height as of this encounter: 5' (1.524 m).   Weight as of this encounter: 77 kg.     ABG:    Component Value Date/Time   TCO2 21 (L) 03/17/2018 0342    Vent Settings: N/A    Condition - Stable  Family Communication  :    Code Status :  Full Code  Diet :  Diet Order            Diet regular Room service appropriate? Yes; Fluid consistency: Thin  Diet effective now                  Disposition Plan  :   Status is: Inpatient  Remains inpatient appropriate because:Inpatient level of care appropriate due to severity of illness   Dispo: The patient is from: Home              Anticipated d/c is to: Home              Anticipated d/c date is: > 3 days              Patient currently is not medically stable to d/c.   Difficult to place patient No   Barriers to discharge: Chest tube in place-on IV antibiotics-May require bronchoscopy/CT surgery referral.   Antimicorbials  :    Anti-infectives (From admission, onward)   Start     Dose/Rate Route Frequency Ordered Stop   01/01/21 1145  cefTRIAXone (ROCEPHIN) 2 g in sodium chloride 0.9 % 100 mL IVPB        2 g 200 mL/hr over 30 Minutes  Intravenous Every 24 hours 01/01/21 1050     01/01/21 1145  metroNIDAZOLE (FLAGYL) tablet 500 mg        500 mg Oral Every 8 hours 01/01/21 1050     01/01/21 1000  remdesivir 100 mg in sodium chloride 0.9 % 100 mL IVPB       "Followed by" Linked Group Details   100 mg 200 mL/hr over 30 Minutes Intravenous Daily 12/31/20 0841 01/02/21 1040   12/31/20 1000  levofloxacin (LEVAQUIN) tablet 750 mg  Status:  Discontinued        750 mg Oral Daily 12/31/20 0841 01/01/21 1050   12/31/20 1000  remdesivir 200 mg in sodium chloride 0.9% 250 mL IVPB       "Followed by" Linked Group Details   200 mg 580 mL/hr over 30 Minutes Intravenous Once 12/31/20 0841 12/31/20 1256   12/31/20 0630  levofloxacin (LEVAQUIN) IVPB 500 mg  Status:  Discontinued        500 mg 100 mL/hr over 60 Minutes Intravenous  Once 12/31/20 4098 12/31/20 0841      Inpatient Medications  Scheduled Meds: . alteplase (TPA) for intrapleural administration  10 mg Intrapleural Daily   And  . pulmozyme (DORNASE) for intrapleural administration  5 mg Intrapleural Daily  . vitamin C  500 mg Oral Daily  . atorvastatin  10 mg Oral Daily  . enoxaparin (LOVENOX) injection  40 mg Subcutaneous Q24H  . fluticasone  2 spray Each Nare Daily  . fluticasone furoate-vilanterol  1 puff Inhalation Daily  . metroNIDAZOLE  500 mg Oral Q8H  . sodium chloride flush  10 mL Intracatheter Q8H  . zinc sulfate  220 mg Oral Daily   Continuous Infusions: . sodium chloride 10 mL/hr at 01/01/21 1108  . cefTRIAXone (ROCEPHIN)  IV 2 g (01/01/21 1159)   PRN Meds:.acetaminophen, albuterol, benzonatate, ibuprofen, morphine injection, ondansetron (ZOFRAN) IV, oxyCODONE   Time Spent in minutes  25  See all Orders from today for further details   Oren Binet M.D on 01/02/2021 at 3:00 PM  To page go to [email or web address] - use universal password  [facility] -  Office  [phone]    Objective:   Vitals:   01/01/21 1600 01/01/21 2037 01/02/21 0402  01/02/21 1234  BP: 115/86 139/86 91/64 98/69   Pulse: 89 (!) 110 89 90  Resp: 18 18 16 16   Temp: 98.9 F (37.2 C) 98.9 F (37.2 C) 99 F (37.2 C) 99.8 F (37.7 C)  TempSrc: Oral Oral Oral Oral  SpO2: 99% 93% 94%   Weight:   77 kg   Height:        Wt Readings from Last 3 Encounters:  01/02/21 77 kg  12/07/20 76.5 kg  11/13/20 76.7 kg     Intake/Output Summary (Last 24 hours) at 01/02/2021 1500 Last data filed at 01/02/2021 1022 Gross per 24 hour  Intake 979.84 ml  Output 1635 ml  Net -655.16 ml     Physical Exam Gen Exam:Alert awake-not in any distress HEENT:atraumatic, normocephalic Chest: B/L clear to auscultation anteriorly CVS:S1S2 regular Abdomen:soft non tender, non distended Extremities:no edema Neurology: Non focal Skin: no rash   Data Review:    CBC Recent Labs  Lab 12/30/20 2254 01/01/21 0025 01/02/21 0401  WBC 12.3* 16.6* 20.0*  HGB 14.4 13.3 11.8*  HCT 40.8 41.0 36.3*  PLT 478* 383 319  MCV 88.5 89.7 89.0  MCH 31.2 29.1 28.9  MCHC 35.3 32.4 32.5  RDW 12.5 12.7 12.9  LYMPHSABS 1.7 1.1 1.3  MONOABS 0.8 1.7* 1.7*  EOSABS 0.1 0.1 0.0  BASOSABS 0.1 0.1 0.1    Chemistries  Recent Labs  Lab 12/30/20 2254 01/01/21 0025 01/02/21 0401  NA 136 136 134*  K 3.8 3.8 3.6  CL 102 101 101  CO2 20* 25 24  GLUCOSE 143* 111* 116*  BUN 9 9 9   CREATININE 0.83 1.00 0.88  CALCIUM 8.8* 8.4* 8.0*  MG  --  1.8 2.0  AST 29 19 15   ALT 30 24 17   ALKPHOS 90 87 64  BILITOT 0.9 1.3* 0.7   ------------------------------------------------------------------------------------------------------------------ Recent Labs    12/31/20 0906  CHOL 149  HDL 39*  LDLCALC 104*  TRIG 32  CHOLHDL 3.8    Lab Results  Component Value Date   HGBA1C 5.4 10/17/2019   ------------------------------------------------------------------------------------------------------------------ No results for input(s): TSH, T4TOTAL, T3FREE, THYROIDAB in the last 72  hours.  Invalid input(s): FREET3 ------------------------------------------------------------------------------------------------------------------ Recent Labs    01/01/21 0025 01/02/21 0401  FERRITIN 424* 527*    Coagulation profile No results for input(s): INR, PROTIME in the last 168 hours.  Recent Labs    01/01/21 0025 01/02/21 0401  DDIMER 3.41* 3.58*    Cardiac Enzymes No results for input(s): CKMB, TROPONINI, MYOGLOBIN in the last 168 hours.  Invalid input(s): CK ------------------------------------------------------------------------------------------------------------------ No results found for: BNP  Micro Results Recent Results (from the past 240 hour(s))  SARS Coronavirus 2 by RT PCR (hospital order, performed in [facility] hospital lab) Nasopharyngeal Nasopharyngeal Swab     Status: Abnormal   Collection Time: 12/30/20 10:50 PM   Specimen: Nasopharyngeal Swab  Result Value Ref Range Status   SARS Coronavirus 2 POSITIVE (A) NEGATIVE Final    Comment: RESULT CALLED TO, READ BACK BY AND VERIFIED WITH: C CRISCO RN 12/31/20 0044 JDW (NOTE) SARS-CoV-2 target nucleic acids are DETECTED  SARS-CoV-2 RNA is generally detectable in upper respiratory specimens  during the acute phase of infection.  Positive results are indicative  of the presence of the identified virus, but do not rule out bacterial infection or co-infection with other pathogens not detected by the test.  Clinical correlation with patient history and  other diagnostic information is necessary to determine patient infection status.  The expected result is negative.  Fact Sheet for Patients:   StrictlyIdeas.no   Fact Sheet for Healthcare Providers:   BankingDealers.co.za    This test is not yet approved or cleared by the [place] FDA and  has been authorized for detection and/or diagnosis of SARS-CoV-2 by FDA under an Emergency Use Authorization  (EUA).  This EUA will remain in effect (meaning this test can  be used) for the duration of  the COVID-19 declaration under Section 564(b)(1) of the Act, 21 U.S.C. section 360-bbb-3(b)(1), unless the authorization is terminated or revoked sooner.  Performed at [facility], [place] [address], [place], [place] [address]   Culture, blood (routine x 2) Call MD if unable to obtain prior to antibiotics being given     Status: None (Preliminary result)   Collection Time: 12/31/20  8:42 AM   Specimen: BLOOD  Result Value Ref Range Status   Specimen Description BLOOD LEFT ANTECUBITAL  Final   Special Requests   Final    BOTTLES DRAWN AEROBIC AND ANAEROBIC Blood Culture results may not be optimal due to an inadequate volume of blood received in culture bottles   Culture   Final    NO GROWTH 2 DAYS Performed at [place] [address], [place], [place] [address]    Report Status PENDING  Incomplete  Culture, blood (routine x 2) Call MD if unable to obtain prior to antibiotics being given     Status: None (Preliminary result)   Collection Time: 12/31/20  9:14 AM   Specimen: BLOOD  Result Value Ref Range Status   Specimen Description BLOOD RIGHT ANTECUBITAL  Final   Special Requests   Final    BOTTLES DRAWN AEROBIC AND ANAEROBIC Blood Culture adequate volume   Culture   Final    NO GROWTH 2 DAYS Performed at [facility], [address] [place], [place] [address]    Report Status PENDING  Incomplete  Body fluid culture (includes gram stain)     Status: None (Preliminary result)   Collection Time: 12/31/20 11:18 AM   Specimen: Pleural Fluid  Result Value Ref Range Status   Specimen Description PLEURAL FLUID  Final   Special Requests NONE  Final   Gram Stain   Final    ABUNDANT WBC PRESENT,BOTH PMN AND MONONUCLEAR NO ORGANISMS SEEN    Culture   Final    NO GROWTH 2 DAYS Performed at [facility], [place] [address] [place], [place] [address]    Report Status  PENDING  Incomplete  Acid Fast Smear (AFB)     Status: None   Collection Time: 12/31/20 11:18 AM   Specimen: Pleural; Respiratory  Result Value Ref Range Status   AFB Specimen Processing Concentration  Final   Acid Fast Smear Negative  Final    Comment: (NOTE) Performed At: [facility]  [facility] [place], [place] [id number] Rush Farmer MD VC:[id number]    Source (AFB) PLEURAL  Final    Comment: FLUID Performed at [facility], [place] [address] [place], [place] [address]     Radiology Reports DG Chest 2 View  Result Date: 12/29/2020 CLINICAL DATA:  Cough. Increasing left lower rib pain. Mass 2 left lower lung. EXAM: CHEST - 2 VIEW COMPARISON:  Radiograph December 13 21.  CT chest 12/01/2020. FINDINGS: Persistent masslike opacity in the left lower lobe, which has increased since 11/09/2020 radiograph. Direct comparison with the 12/01/2020 CT chest is difficult given differences in modality. New small left pleural effusion. No visible pneumothorax. Cardiomediastinal silhouette is within normal limits. No evidence of acute osseous abnormality. IMPRESSION: Persistent masslike opacity in the left lower lobe with new small left pleural effusion. The opacity is increased relative to prior radiograph from 11/09/2020. Comparison with recent CT chest is limited given differences in modality. Differential considerations remain pneumonia versus malignancy and recommend follow-up chest CT as directed from the 12/01/2020 CT chest. Electronically Signed   By: Margaretha Sheffield MD   On: 12/29/2020 13:22   CT ANGIO CHEST PE W OR WO CONTRAST  Result Date: 12/31/2020 CLINICAL DATA:  Shortness of breath EXAM: CT ANGIOGRAPHY CHEST WITH CONTRAST TECHNIQUE: Multidetector CT imaging of the chest was performed using the standard protocol during bolus administration of intravenous contrast. Multiplanar CT image reconstructions and MIPs were obtained to evaluate the vascular anatomy. CONTRAST:  162m  OMNIPAQUE IOHEXOL 350 MG/ML SOLN COMPARISON:  CT 12/01/2020, 10/14/2020 FINDINGS: Cardiovascular: Satisfactory opacification the pulmonary arteries to the segmental level. No pulmonary artery filling defects are identified. Central pulmonary arteries are top-normal caliber. Cardiac size is top normal. No sizable pericardial effusion. There is however some inflammation in the adjacent paracardial/mediastinal fat which is likely secondary to the it extensive process in the left lung. The aorta is normal caliber. No acute luminal abnormality of the imaged aorta. No periaortic stranding or hemorrhage. Normal 3 vessel branching of the aortic arch. Proximal great vessels are unremarkable. No major venous abnormalities are seen. Mediastinum/Nodes: Stranding and thickening in the left anterior mediastinal/paracardial fat. No mediastinal free fluid or air. Normal thyroid gland and thoracic inlet. No acute abnormality of the trachea or esophagus. Scattered low-attenuation subcentimeter mediastinal and hilar adenopathy. Several larger subcarinal and left hilar nodes are present measuring up to, 1.5 cm and 1.2 cm respectively (7/189), quite similar to prior. There is some increasing left axillary adenopathy is present as well with some mild surrounding stranding which could reflect a reactive process. Lungs/Pleura: Increasing consolidative opacity centered in the left lower with hypoattenuation of the consolidated parenchyma suggestive of some underlying infection with scattered air lucencies, possibly developing cavitation (6/77). Development of a complex, loculated pleural effusion extending over the lung apex, into the fissures, and throughout the lung base. Suspect some reactive stranding of the adjacent mediastinal fat as detailed above. Several areas suggest some associated pleural thickening which could reflect a developing empyema. Right lung remains essentially clear without visible right pleural effusion. No  pneumothorax. Upper Abdomen: No acute abnormalities present in the visualized portions of the upper abdomen. Musculoskeletal: No acute osseous abnormality or suspicious osseous lesion. Multilevel degenerative changes are present in the imaged portions of the spine. No clear involvement of the chest wall. No erosive or destructive changes of the adjacent ribs. No worrisome chest wall masses or lesions. Review of the MIP images confirms the above findings. IMPRESSION: 1. No  pulmonary embolism is seen. 2. Increasing consolidative opacity centered in the left lower with hypoattenuation of the consolidated parenchyma suggestive of some underlying infection with scattered air lucencies, possibly developing cavitation or necrosis. Additional development of a complex, loculated pleural effusion extending over the lung apex, into the fissure, and throughout the lung base. Several areas suggest some associated pleural thickening/empyema. Continued follow-up to resolution is recommended as an underlying mass/malignancy is difficult to fully exclude. 3. Some increasing scattered subcentimeter hypoattenuating mediastinal, hilar and left axillary adenopathy, may be reactive though continued attention on follow-up imaging is warranted pending exclusion of malignancy. These results were called by telephone at the time of interpretation on 12/31/2020 at 6:16 am to provider [facility] , who verbally acknowledged these results. Electronically Signed   By: Lovena Le M.D.   On: 12/31/2020 06:17   DG Chest Port 1 View  Result Date: 01/02/2021 CLINICAL DATA:  Shortness of breath, COVID-19 positive. EXAM: PORTABLE CHEST 1 VIEW COMPARISON:  January 01, 2021. FINDINGS: Stable cardiomegaly. No pneumothorax is noted. Stable position of left-sided chest tube. Right lung is clear. Loculated left pleural effusion noted on prior exam is significantly smaller currently. Stable left basilar atelectasis or infiltrate is noted. Bony thorax is  unremarkable. IMPRESSION: Stable position of left-sided chest tube. Loculated left pleural effusion noted on prior exam is significantly smaller currently. Stable left basilar atelectasis or infiltrate is noted. Electronically Signed   By: Marijo Conception M.D.   On: 01/02/2021 09:53   DG CHEST PORT 1 VIEW  Result Date: 01/01/2021 CLINICAL DATA:  Pleural effusion, chest tube, COVID pneumonia EXAM: PORTABLE CHEST 1 VIEW COMPARISON:  12/31/2020 FINDINGS: Large, partially laterally loculated left pleural effusion has enlarged in size. Left basilar pigtail chest tube positioned inferomedially is unchanged. Interval development of mild mediastinal shift to the right. Right lung is clear. No pneumothorax. No pleural effusion on the right. Cardiac size within normal limits. IMPRESSION: Left chest tube unchanged. Progressive enlargement of a partially loculated left pleural effusion with developing mediastinal shift to the right. CT examination may be helpful to assess catheter position in relation to the developing pleural fluid. Electronically Signed   By: Fidela Salisbury MD   On: 01/01/2021 08:15   DG CHEST PORT 1 VIEW  Result Date: 12/31/2020 CLINICAL DATA:  Loculated pleural effusion. EXAM: PORTABLE CHEST 1 VIEW COMPARISON:  CT 12/31/2020.  Chest x-ray 12/29/2020. FINDINGS: Left chest tube noted over the left lower chest. Prominent left pleural effusion with possible loculation. Underlying left base atelectasis/infiltrate most likely present. No pneumothorax. Heart size stable. IMPRESSION: Left chest tube noted over the left lower chest. Prominent left pleural effusion with possible loculation. Underlying left base atelectasis/infiltrate most likely present. No pneumothorax. Electronically Signed   By: Marcello Moores  Register   On: 12/31/2020 11:43

## 2021-01-02 NOTE — Progress Notes (Addendum)
NAME:  Joseph Fitzgerald, MRN:  [id number], DOB:  1971-10-02, LOS: 2 ADMISSION DATE:  12/30/2020, CONSULTATION DATE:  2/3 REFERRING MD:  Lorin Mercy, CHIEF COMPLAINT:  Chest pain   Brief History:  50 y/o male who has been followed in the pulmonology clinic for a left lower lobe mass felt to be of infectious etiology presented to the [facility] emergency department on January 01, 2020 complaining of left chest pain.  Noted to have an increased size of the mass with an associated pleural effusion.  History of Present Illness:  This is a 50 year old male with minimal past medical history who was initially seen in the pulmonology clinic in late 2021 after he developed left-sided chest pain and was seen in urgent care.  He had a chest x-ray which showed a mass with likely cavitation.  He was treated with oral antibiotics for over a month and in January 2022 he was noted to have decreasing size of the mass and symptoms had improved.  Of note, the patient has very poor dentition, previously had no dental insurance and had pulled most of his teeth by himself when he had dental problems.  However on December 29, 2020 he presented to urgent care with a complaint of left-sided chest pain again.  He was treated with antibiotics, chest x-ray showed increasing size of the mass and he was referred back to pulmonology.  Plans are being made for bronchoscopic evaluation.  He returned to the [facility] emergency department on December 31, 2020 complaining of left-sided chest pain, worsening dyspnea, noted to be febrile.  The dyspnea and chest pain started on Sunday January 30.  A CT angiogram chest was performed which showed increasing size of the necrotic mass and a moderate size loculated pleural effusion.  Cigarette smoking> noted in his history but he denies when I ask.  He was found to be COVID positive.  He states that he had his first vaccine on 1/15, doesn't know the brand.  He has had symptoms of headache and mild body aches.     Past Medical History:  Smoker? Poor dentition Mass of lower lobe of left lung Marijuana abuse Asthma Aortic atherosclerosis  Significant Hospital Events:    Consults:  PCCM  Procedures:    Significant Diagnostic Tests:  2/3 CT Angiogram chest > right lung without evidence of pulmonary parenchymal abnormality, left lung with large left lower lobe mass, and cavitation, partially loculated anterior fissure effusion in 2 separate locations: 1 in the left base and a separate in the superior thorax, bulky adenopathy noted in the subcarinal, left pretracheal, and left hilar areas.  Micro Data:  2/3 sars cov 2 > positive 2/3 blood >   Antimicrobials:  2/3 levaquin >  2/3 remdesivir >  Interim History / Subjective:  NAEON, minimal CT output, CXR unchanged  Objective   Blood pressure 98/69, pulse 90, temperature 99.8 F (37.7 C), temperature source Oral, resp. rate 16, height 5' (1.524 m), weight 77 kg, SpO2 94 %.        Intake/Output Summary (Last 24 hours) at 01/02/2021 1831 Last data filed at 01/02/2021 1545 Gross per 24 hour  Intake 1219.84 ml  Output 1330 ml  Net -110.16 ml   Filed Weights   12/31/20 1530 01/02/21 0402  Weight: 76.5 kg 77 kg    Examination: General appearance: 50 y.o., male, NAD, conversant  Eyes: anicteric sclerae,; PERRLA, tracking appropriately HENT: NCAT Neck: Trachea midline; FROM, supple, lymphadenopathy, no JVD Lungs: Left chest diminished compared  to the right, no crackles no wheeze CV: RRR, S1, S2, no MRGs  Abdomen: Soft, non-tender; non-distended, BS present  Extremities: No peripheral edema, radial and DP pulses present bilaterally  Skin: Normal temperature, turgor and texture; no rash Psych: Appropriate affect Neuro: Alert and oriented to person and place, no focal deficit     Resolved Hospital Problem list     Assessment & Plan:   Cavitary pneumonia with associate pleural effusion in a male with poor dentition: Favor  infectious etiology, now with likely empyema though malignancy is possible. Pleural fluid studies consistent with inflammatory process. Leukocytosis Plan: Pigtail drainage still in place. Continue ceftriaxone plus metronidazole Continue to follow cultures, no growth to date Additional TPA dornase today. Discussed with patient today. Patient is agreeable to proceed. Please see separate procedure note. Repeat chest x-ray in a.m. If effusion still present will need repeat lytics tomorrow. Then consider CT imaging of the chest. If it does not clear may need to consider thoracic surgery involvement. This was discussed with patient today.  Chest pain status post pigtail catheter: Plan: As needed morphine, oxycodone for pain  Poor dentition Plan: Outpatient dental referral.  COVID-19 infection Incidental, remdesivir x3 days. Appreciate hospitalist .  Best practice (evaluated daily)   Per [facility]  Goals of Care:   Per [facility]  Labs   CBC: Recent Labs  Lab 12/30/20 2254 01/01/21 0025 01/02/21 0401  WBC 12.3* 16.6* 20.0*  NEUTROABS 9.6* 13.7* 16.7*  HGB 14.4 13.3 11.8*  HCT 40.8 41.0 36.3*  MCV 88.5 89.7 89.0  PLT 478* 383 175    Basic Metabolic Panel: Recent Labs  Lab 12/30/20 2254 01/01/21 0025 01/02/21 0401  NA 136 136 134*  K 3.8 3.8 3.6  CL 102 101 101  CO2 20* 25 24  GLUCOSE 143* 111* 116*  BUN 9 9 9   CREATININE 0.83 1.00 0.88  CALCIUM 8.8* 8.4* 8.0*  MG  --  1.8 2.0  PHOS  --  1.9* 2.8   GFR: Estimated Creatinine Clearance: 89.2 mL/min (by C-G formula based on SCr of 0.88 mg/dL). Recent Labs  Lab 12/30/20 2253 12/30/20 2254 12/31/20 0549 12/31/20 0906 01/01/21 0025 01/02/21 0401  PROCALCITON  --   --   --  1.25  --   --   WBC  --  12.3*  --   --  16.6* 20.0*  LATICACIDVEN 1.1  --  1.3  --   --   --     Liver Function Tests: Recent Labs  Lab 12/30/20 2254 01/01/21 0025 01/02/21 0401  AST 29 19 15   ALT 30 24 17   ALKPHOS 90 87 64  BILITOT  0.9 1.3* 0.7  PROT 8.3* 7.4 5.9*  ALBUMIN 2.7* 2.3* 1.8*   No results for input(s): LIPASE, AMYLASE in the last 168 hours. No results for input(s): AMMONIA in the last 168 hours.  ABG    Component Value Date/Time   TCO2 21 (L) 03/17/2018 0342     Coagulation Profile: No results for input(s): INR, PROTIME in the last 168 hours.  Cardiac Enzymes: No results for input(s): CKTOTAL, CKMB, CKMBINDEX, TROPONINI in the last 168 hours.  HbA1C: HbA1c POC (<> result, manual entry)  Date/Time Value Ref Range Status  10/17/2019 03:00 PM 5.4 4.0 - 5.6 % Final   Hgb A1c MFr Bld  Date/Time Value Ref Range Status  03/17/2018 03:34 AM 5.1 4.8 - 5.6 % Final    Comment:    (NOTE) Pre diabetes:  5.7%-6.4% Diabetes:              >6.4% Glycemic control for   <7.0% adults with diabetes   08/14/2016 08:09 AM 5.4 4.8 - 5.6 % Final    Comment:    (NOTE)         Pre-diabetes: 5.7 - 6.4         Diabetes: >6.4         Glycemic control for adults with diabetes: <7.0     CBG: No results for input(s): GLUCAP in the last 168 hours.   [facility] 01/02/2021 6:31 PM

## 2021-01-02 NOTE — Procedures (Signed)
Pleural Fibrinolytic Administration Procedure Note  Joseph Fitzgerald  [id number]  22-Mar-1971  Date:01/02/21  Time:6:29 PM   Provider Performing:Joseph Fitzgerald L Joseph Fitzgerald   Procedure: Pleural Fibrinolysis Subsequent day (29924)  Indication(s) Fibrinolysis of complicated pleural effusion  Consent Risks of the procedure as well as the alternatives and risks of each were explained to the patient and/or caregiver.  Consent for the procedure was obtained.   Anesthesia None   Time Out Verified patient identification, verified procedure, site/side was marked, verified correct patient position, special equipment/implants available, medications/allergies/relevant history reviewed, required imaging and test results available.   Sterile Technique Hand hygiene, gloves   Procedure Description Existing pleural catheter was cleaned and accessed in sterile manner.  38m of tPA in 30cc of saline and 596mof dornase in 30cc of sterile water were injected into pleural space using existing pleural catheter.  Catheter will be clamped for 1 hour and then placed back to suction.   Complications/Tolerance None; patient tolerated the procedure well.  EBL None   Specimen(s) None  Joseph NashDO [facility] 01/02/2021 6:29 PM

## 2021-01-03 ENCOUNTER — Inpatient Hospital Stay (HOSPITAL_COMMUNITY): Payer: 59

## 2021-01-03 DIAGNOSIS — U071 COVID-19: Secondary | ICD-10-CM | POA: Diagnosis not present

## 2021-01-03 DIAGNOSIS — R7989 Other specified abnormal findings of blood chemistry: Secondary | ICD-10-CM | POA: Diagnosis not present

## 2021-01-03 LAB — CBC WITH DIFFERENTIAL/PLATELET
Abs Immature Granulocytes: 0.09 10*3/uL — ABNORMAL HIGH (ref 0.00–0.07)
Basophils Absolute: 0 10*3/uL (ref 0.0–0.1)
Basophils Relative: 0 %
Eosinophils Absolute: 0.1 10*3/uL (ref 0.0–0.5)
Eosinophils Relative: 0 %
HCT: 34.6 % — ABNORMAL LOW (ref 39.0–52.0)
Hemoglobin: 12.3 g/dL — ABNORMAL LOW (ref 13.0–17.0)
Immature Granulocytes: 1 %
Lymphocytes Relative: 7 %
Lymphs Abs: 1.1 10*3/uL (ref 0.7–4.0)
MCH: 31.1 pg (ref 26.0–34.0)
MCHC: 35.5 g/dL (ref 30.0–36.0)
MCV: 87.6 fL (ref 80.0–100.0)
Monocytes Absolute: 1.5 10*3/uL — ABNORMAL HIGH (ref 0.1–1.0)
Monocytes Relative: 9 %
Neutro Abs: 13.1 10*3/uL — ABNORMAL HIGH (ref 1.7–7.7)
Neutrophils Relative %: 83 %
Platelets: 324 10*3/uL (ref 150–400)
RBC: 3.95 MIL/uL — ABNORMAL LOW (ref 4.22–5.81)
RDW: 13 % (ref 11.5–15.5)
WBC: 15.8 10*3/uL — ABNORMAL HIGH (ref 4.0–10.5)
nRBC: 0 % (ref 0.0–0.2)

## 2021-01-03 LAB — FERRITIN: Ferritin: 640 ng/mL — ABNORMAL HIGH (ref 24–336)

## 2021-01-03 LAB — COMPREHENSIVE METABOLIC PANEL
ALT: 16 U/L (ref 0–44)
AST: 18 U/L (ref 15–41)
Albumin: 1.8 g/dL — ABNORMAL LOW (ref 3.5–5.0)
Alkaline Phosphatase: 68 U/L (ref 38–126)
Anion gap: 9 (ref 5–15)
BUN: 10 mg/dL (ref 6–20)
CO2: 26 mmol/L (ref 22–32)
Calcium: 7.8 mg/dL — ABNORMAL LOW (ref 8.9–10.3)
Chloride: 100 mmol/L (ref 98–111)
Creatinine, Ser: 0.75 mg/dL (ref 0.61–1.24)
GFR, Estimated: >60 mL/min (ref >60)
Glucose, Bld: 114 mg/dL — ABNORMAL HIGH (ref 70–99)
Potassium: 3.4 mmol/L — ABNORMAL LOW (ref 3.5–5.1)
Sodium: 135 mmol/L (ref 135–145)
Total Bilirubin: 0.5 mg/dL (ref 0.3–1.2)
Total Protein: 6 g/dL — ABNORMAL LOW (ref 6.5–8.1)

## 2021-01-03 LAB — C-REACTIVE PROTEIN: CRP: 26.5 mg/dL — ABNORMAL HIGH (ref <1.0)

## 2021-01-03 LAB — PHOSPHORUS: Phosphorus: 2.6 mg/dL (ref 2.5–4.6)

## 2021-01-03 LAB — BODY FLUID CULTURE: Culture: NO GROWTH

## 2021-01-03 LAB — MAGNESIUM: Magnesium: 1.9 mg/dL (ref 1.7–2.4)

## 2021-01-03 LAB — D-DIMER, QUANTITATIVE: D-Dimer, Quant: 5.93 ug/mL-FEU — ABNORMAL HIGH (ref 0.00–0.50)

## 2021-01-03 NOTE — Progress Notes (Signed)
Lower extremity venous bilateral study completed.   Please see CV Proc for preliminary results.   Darlin Coco, RDMS

## 2021-01-03 NOTE — Progress Notes (Signed)
NAME:  Joseph Fitzgerald, MRN:  [id number], DOB:  1971/07/27, LOS: 3 ADMISSION DATE:  12/30/2020, CONSULTATION DATE:  2/3 REFERRING MD:  Lorin Mercy, CHIEF COMPLAINT:  Chest pain   Brief History:  50 y/o male who has been followed in the pulmonology clinic for a left lower lobe mass felt to be of infectious etiology presented to the [facility] emergency department on January 01, 2020 complaining of left chest pain.  Noted to have an increased size of the mass with an associated pleural effusion.  History of Present Illness:  This is a 50 year old male with minimal past medical history who was initially seen in the pulmonology clinic in late 2021 after he developed left-sided chest pain and was seen in urgent care.  He had a chest x-ray which showed a mass with likely cavitation.  He was treated with oral antibiotics for over a month and in January 2022 he was noted to have decreasing size of the mass and symptoms had improved.  Of note, the patient has very poor dentition, previously had no dental insurance and had pulled most of his teeth by himself when he had dental problems.  However on December 29, 2020 he presented to urgent care with a complaint of left-sided chest pain again.  He was treated with antibiotics, chest x-ray showed increasing size of the mass and he was referred back to pulmonology.  Plans are being made for bronchoscopic evaluation.  He returned to the [facility] emergency department on December 31, 2020 complaining of left-sided chest pain, worsening dyspnea, noted to be febrile.  The dyspnea and chest pain started on Sunday January 30.  A CT angiogram chest was performed which showed increasing size of the necrotic mass and a moderate size loculated pleural effusion.  Cigarette smoking> noted in his history but he denies when I ask.  He was found to be COVID positive.  He states that he had his first vaccine on 1/15, doesn't know the brand.  He has had symptoms of headache and mild body aches.     Past Medical History:  Smoker? Poor dentition Mass of lower lobe of left lung Marijuana abuse Asthma Aortic atherosclerosis  Significant Hospital Events:    Consults:  PCCM  Procedures:    Significant Diagnostic Tests:  2/3 CT Angiogram chest > right lung without evidence of pulmonary parenchymal abnormality, left lung with large left lower lobe mass, and cavitation, partially loculated anterior fissure effusion in 2 separate locations: 1 in the left base and a separate in the superior thorax, bulky adenopathy noted in the subcarinal, left pretracheal, and left hilar areas.  Micro Data:  2/3 sars cov 2 > positive 2/3 blood >   Antimicrobials:  2/3 levaquin >  2/3 remdesivir >  Interim History / Subjective:   Comfortable. Resting in bed.   Objective   Blood pressure 98/69, pulse 90, temperature 99.8 F (37.7 C), temperature source Oral, resp. rate 16, height 5' (1.524 m), weight 77 kg, SpO2 94 %.        Intake/Output Summary (Last 24 hours) at 01/03/2021 1257 Last data filed at 01/03/2021 0941 Gross per 24 hour  Intake 380 ml  Output 350 ml  Net 30 ml   Filed Weights   12/31/20 1530 01/02/21 0402  Weight: 76.5 kg 77 kg    Examination: General appearance: 50 y.o., male, NAD, conversant  Eyes: anicteric sclerae, moist conjunctivae; no lid-lag; PERRLA, tracking appropriately HENT: NCAT; oropharynx, MMM, no mucosal ulcerations; normal hard and soft  palate Neck: Trachea midline; FROM, no JVD  Lungs: diminished breath sounds in the left base  CV: RRR, S1, S2, no MRGs  Abdomen: Soft, non-tender; non-distended, BS present  Extremities: No peripheral edema, radial and DP pulses present bilaterally Psych: Appropriate affect Neuro: Alert and oriented to person and place, no focal deficit   Resolved Hospital Problem list     Assessment & Plan:   Cavitary pneumonia with associate pleural effusion in a male with poor dentition: Favor infectious etiology, now with  likely empyema though malignancy is possible.  Pleural fluid studies consistent with inflammatory process. Leukocytosis Plan: S/p pigtail in place  Continue ceftriaxone and metronidazole  Follow culutres  TPA/DNAse again today  Consider repeat CT Chest in tomorrow  I have placed orders for CT CHEST   Chest pain status post pigtail catheter: Plan: As needed morphine and oxycodone   Poor dentition Plan: Outpatient dental referral   COVID-19 infection Incidental, remdesivir x3 days. - per [facility] .  Best practice (evaluated daily)   Per [facility]  Goals of Care:   Per [facility]  Labs   CBC: Recent Labs  Lab 12/30/20 2254 01/01/21 0025 01/02/21 0401 01/03/21 0404  WBC 12.3* 16.6* 20.0* 15.8*  NEUTROABS 9.6* 13.7* 16.7* 13.1*  HGB 14.4 13.3 11.8* 12.3*  HCT 40.8 41.0 36.3* 34.6*  MCV 88.5 89.7 89.0 87.6  PLT 478* 383 319 250    Basic Metabolic Panel: Recent Labs  Lab 12/30/20 2254 01/01/21 0025 01/02/21 0401 01/03/21 0404  NA 136 136 134* 135  K 3.8 3.8 3.6 3.4*  CL 102 101 101 100  CO2 20* 25 24 26   GLUCOSE 143* 111* 116* 114*  BUN 9 9 9 10   CREATININE 0.83 1.00 0.88 0.75  CALCIUM 8.8* 8.4* 8.0* 7.8*  MG  --  1.8 2.0 1.9  PHOS  --  1.9* 2.8 2.6   GFR: Estimated Creatinine Clearance: 98.2 mL/min (by C-G formula based on SCr of 0.75 mg/dL). Recent Labs  Lab 12/30/20 2253 12/30/20 2254 12/31/20 0549 12/31/20 0906 01/01/21 0025 01/02/21 0401 01/03/21 0404  PROCALCITON  --   --   --  1.25  --   --   --   WBC  --  12.3*  --   --  16.6* 20.0* 15.8*  LATICACIDVEN 1.1  --  1.3  --   --   --   --     Liver Function Tests: Recent Labs  Lab 12/30/20 2254 01/01/21 0025 01/02/21 0401 01/03/21 0404  AST 29 19 15 18   ALT 30 24 17 16   ALKPHOS 90 87 64 68  BILITOT 0.9 1.3* 0.7 0.5  PROT 8.3* 7.4 5.9* 6.0*  ALBUMIN 2.7* 2.3* 1.8* 1.8*   No results for input(s): LIPASE, AMYLASE in the last 168 hours. No results for input(s): AMMONIA in the last 168  hours.  ABG    Component Value Date/Time   TCO2 21 (L) 03/17/2018 0342     Coagulation Profile: No results for input(s): INR, PROTIME in the last 168 hours.  Cardiac Enzymes: No results for input(s): CKTOTAL, CKMB, CKMBINDEX, TROPONINI in the last 168 hours.  HbA1C: HbA1c POC (<> result, manual entry)  Date/Time Value Ref Range Status  10/17/2019 03:00 PM 5.4 4.0 - 5.6 % Final   Hgb A1c MFr Bld  Date/Time Value Ref Range Status  03/17/2018 03:34 AM 5.1 4.8 - 5.6 % Final    Comment:    (NOTE) Pre diabetes:  5.7%-6.4% Diabetes:              >6.4% Glycemic control for   <7.0% adults with diabetes   08/14/2016 08:09 AM 5.4 4.8 - 5.6 % Final    Comment:    (NOTE)         Pre-diabetes: 5.7 - 6.4         Diabetes: >6.4         Glycemic control for adults with diabetes: <7.0     CBG: No results for input(s): GLUCAP in the last 168 hours.   [name], DO [facility] 01/03/2021 12:57 PM

## 2021-01-03 NOTE — Progress Notes (Signed)
PROGRESS NOTE                                                                                                                                                                                                             Patient Demographics:    Joseph Fitzgerald, is a 50 y.o. male, DOB - 1971-11-19, EVO:[id number]  Outpatient Primary MD for the patient is Ladell Pier, MD   Admit date - 12/30/2020   LOS - 3  No chief complaint on file.      Brief Narrative: Patient is a 50 y.o. male who has been followed in the pulmonology clinic for a left lower lung mass-presented to the ED on 2/3 with left-sided chest pain-found to have increasing left lower lobe mass and a loculated pleural effusion.  Evaluated by PCCM-chest tube placed-and subsequently admitted to the hospitalist service.  See below for further details.  COVID-19 vaccinated status: Partially vaccinated  Significant Events: 2/4>> Admit to [facility] for loculated pleural effusion/increasing left lower lobe mass.  Incidental COVID  Significant studies: 2/3>> CTA chest: No PE, complex/loculated pleural effusion-increased left lower lobe mass 2/6>> bilateral lower extremity Doppler: No DVT  COVID-19 medications: Remdesivir: 2/3>> 2/5  Antibiotics: Levofloxacin: 2/3>> 2/4 Rocephin: 2/4>> Flagyl: 2/4>>  Microbiology data: 2/3 >>blood culture: No growth 2/3>> Gold Quant front: Pending 2/3>> pleural fluid AFB smear: Negative 2/3>> pleural fluid culture: Negative 2/3>> pleural fluid fungal culture/AFB culture: Pending  Procedures: 2/3>> chest tube placement by PCCM  Consults: PCCM  DVT prophylaxis: enoxaparin (LOVENOX) injection 40 mg Start: 12/31/20 0845    Subjective:   Lying comfortably in bed-denies any chest pain or shortness of breath.   Assessment  & Plan :   Left cavitary mass with complicated/loculated pleural effusion-likely empyema: Chest tube in  place-antibiotics as above-culture data as above-awaiting further recommendations from PCCM.   Poor dentition  COVID-19 infection: Incidental-asymptomatic-Remdesivir x3 days completed on 2/5-apart from watchful observation no further treatment required at this time.  Needs isolation for a total of 10 days from 2/2.  COVID-19 Labs: Recent Labs    01/01/21 0025 01/02/21 0401 01/03/21 0404  DDIMER 3.41* 3.58* 5.93*  FERRITIN 424* 527* 640*  CRP 31.7* 28.1* 26.5*    No results found for: BNP  Recent Labs  Lab 12/31/20 0906  PROCALCITON 1.25    Lab Results  Component Value Date   SARSCOV2NAA POSITIVE (A) 12/30/2020   [place] Not Detected 10/01/2020   [place] NEGATIVE 09/08/2019     Obesity: Estimated body mass index is 33.15 kg/m as calculated from the following:   Height as of this encounter: 5' (1.524 m).   Weight as of this encounter: 77 kg.     ABG:    Component Value Date/Time   TCO2 21 (L) 03/17/2018 0342    Vent Settings: N/A    Condition - Stable  Family Communication  :    Code Status :  Full Code  Diet :  Diet Order            Diet regular Room service appropriate? Yes; Fluid consistency: Thin  Diet effective now                  Disposition Plan  :   Status is: Inpatient  Remains inpatient appropriate because:Inpatient level of care appropriate due to severity of illness   Dispo: The patient is from: Home              Anticipated d/c is to: Home              Anticipated d/c date is: > 3 days              Patient currently is not medically stable to d/c.   Difficult to place patient No   Barriers to discharge: Chest tube in place-on IV antibiotics-May require bronchoscopy/CT surgery referral.   Antimicorbials  :    Anti-infectives (From admission, onward)   Start     Dose/Rate Route Frequency Ordered Stop   01/01/21 1145  cefTRIAXone (ROCEPHIN) 2 g in sodium chloride 0.9 % 100 mL IVPB        2 g 200 mL/hr over 30  Minutes Intravenous Every 24 hours 01/01/21 1050     01/01/21 1145  metroNIDAZOLE (FLAGYL) tablet 500 mg        500 mg Oral Every 8 hours 01/01/21 1050     01/01/21 1000  remdesivir 100 mg in sodium chloride 0.9 % 100 mL IVPB       "Followed by" Linked Group Details   100 mg 200 mL/hr over 30 Minutes Intravenous Daily 12/31/20 0841 01/02/21 1040   12/31/20 1000  levofloxacin (LEVAQUIN) tablet 750 mg  Status:  Discontinued        750 mg Oral Daily 12/31/20 0841 01/01/21 1050   12/31/20 1000  remdesivir 200 mg in sodium chloride 0.9% 250 mL IVPB       "Followed by" Linked Group Details   200 mg 580 mL/hr over 30 Minutes Intravenous Once 12/31/20 0841 12/31/20 1256   12/31/20 0630  levofloxacin (LEVAQUIN) IVPB 500 mg  Status:  Discontinued        500 mg 100 mL/hr over 60 Minutes Intravenous  Once 12/31/20 9449 12/31/20 0841      Inpatient Medications  Scheduled Meds: . alteplase (TPA) for intrapleural administration  10 mg Intrapleural Daily   And  . pulmozyme (DORNASE) for intrapleural administration  5 mg Intrapleural Daily  . vitamin C  500 mg Oral Daily  . atorvastatin  10 mg Oral Daily  . enoxaparin (LOVENOX) injection  40 mg Subcutaneous Q24H  . fluticasone  2 spray Each Nare Daily  . fluticasone furoate-vilanterol  1 puff Inhalation Daily  . metroNIDAZOLE  500 mg Oral Q8H  . sodium chloride flush  10 mL Intracatheter Q8H  . zinc sulfate  220 mg Oral Daily   Continuous Infusions: . sodium chloride 10 mL/hr at 01/01/21 1108  . cefTRIAXone (ROCEPHIN)  IV 2 g (01/02/21 1532)   PRN Meds:.acetaminophen, albuterol, benzonatate, ibuprofen, morphine injection, ondansetron (ZOFRAN) IV, oxyCODONE   Time Spent in minutes  15  See all Orders from today for further details   Oren Binet M.D on 01/03/2021 at 11:37 AM  To page go to [email or web address] - use universal password  [facility] -  Office  [phone]    Objective:   Vitals:   01/01/21 1600 01/01/21 2037  01/02/21 0402 01/02/21 1234  BP: 115/86 139/86 91/64 98/69   Pulse: 89 (!) 110 89 90  Resp: 18 18 16 16   Temp: 98.9 F (37.2 C) 98.9 F (37.2 C) 99 F (37.2 C) 99.8 F (37.7 C)  TempSrc: Oral Oral Oral Oral  SpO2: 99% 93% 94%   Weight:   77 kg   Height:        Wt Readings from Last 3 Encounters:  01/02/21 77 kg  12/07/20 76.5 kg  11/13/20 76.7 kg     Intake/Output Summary (Last 24 hours) at 01/03/2021 1137 Last data filed at 01/03/2021 0941 Gross per 24 hour  Intake 380 ml  Output 350 ml  Net 30 ml     Physical Exam Gen Exam:Alert awake-not in any distress HEENT:atraumatic, normocephalic Chest: B/L clear to auscultation anteriorly CVS:S1S2 regular Abdomen:soft non tender, non distended Extremities:no edema Neurology: Non focal Skin: no rash   Data Review:    CBC Recent Labs  Lab 12/30/20 2254 01/01/21 0025 01/02/21 0401 01/03/21 0404  WBC 12.3* 16.6* 20.0* 15.8*  HGB 14.4 13.3 11.8* 12.3*  HCT 40.8 41.0 36.3* 34.6*  PLT 478* 383 319 324  MCV 88.5 89.7 89.0 87.6  MCH 31.2 29.1 28.9 31.1  MCHC 35.3 32.4 32.5 35.5  RDW 12.5 12.7 12.9 13.0  LYMPHSABS 1.7 1.1 1.3 1.1  MONOABS 0.8 1.7* 1.7* 1.5*  EOSABS 0.1 0.1 0.0 0.1  BASOSABS 0.1 0.1 0.1 0.0    Chemistries  Recent Labs  Lab 12/30/20 2254 01/01/21 0025 01/02/21 0401 01/03/21 0404  NA 136 136 134* 135  K 3.8 3.8 3.6 3.4*  CL 102 101 101 100  CO2 20* 25 24 26   GLUCOSE 143* 111* 116* 114*  BUN 9 9 9 10   CREATININE 0.83 1.00 0.88 0.75  CALCIUM 8.8* 8.4* 8.0* 7.8*  MG  --  1.8 2.0 1.9  AST 29 19 15 18   ALT 30 24 17 16   ALKPHOS 90 87 64 68  BILITOT 0.9 1.3* 0.7 0.5   ------------------------------------------------------------------------------------------------------------------ No results for input(s): CHOL, HDL, LDLCALC, TRIG, CHOLHDL, LDLDIRECT in the last 72 hours.  Lab Results  Component Value Date   HGBA1C 5.4 10/17/2019    ------------------------------------------------------------------------------------------------------------------ No results for input(s): TSH, T4TOTAL, T3FREE, THYROIDAB in the last 72 hours.  Invalid input(s): FREET3 ------------------------------------------------------------------------------------------------------------------ Recent Labs    01/02/21 0401 01/03/21 0404  FERRITIN 527* 640*    Coagulation profile No results for input(s): INR, PROTIME in the last 168 hours.  Recent Labs    01/02/21 0401 01/03/21 0404  DDIMER 3.58* 5.93*    Cardiac Enzymes No results for input(s): CKMB, TROPONINI, MYOGLOBIN in the last 168 hours.  Invalid input(s): CK ------------------------------------------------------------------------------------------------------------------ No results found for: BNP  Micro Results Recent Results (from the past 240 hour(s))  SARS Coronavirus 2 by RT PCR (hospital order, performed in [facility] [place] [facility] hospital lab) Nasopharyngeal Nasopharyngeal Swab     Status: Abnormal  Collection Time: 12/30/20 10:50 PM   Specimen: Nasopharyngeal Swab  Result Value Ref Range Status   SARS Coronavirus 2 POSITIVE (A) NEGATIVE Final    Comment: RESULT CALLED TO, READ BACK BY AND VERIFIED WITH: C CRISCO RN 12/31/20 0044 JDW (NOTE) SARS-CoV-2 target nucleic acids are DETECTED  SARS-CoV-2 RNA is generally detectable in upper respiratory specimens  during the acute phase of infection.  Positive results are indicative  of the presence of the identified virus, but do not rule out bacterial infection or co-infection with other pathogens not detected by the test.  Clinical correlation with patient history and  other diagnostic information is necessary to determine patient infection status.  The expected result is negative.  Fact Sheet for Patients:   StrictlyIdeas.no   Fact Sheet for Healthcare Providers:    BankingDealers.co.za    This test is not yet approved or cleared by the [place] FDA and  has been authorized for detection and/or diagnosis of SARS-CoV-2 by FDA under an Emergency Use Authorization (EUA).  This EUA will remain in effect (meaning this test can  be used) for the duration of  the COVID-19 declaration under Section 564(b)(1) of the Act, 21 U.S.C. section 360-bbb-3(b)(1), unless the authorization is terminated or revoked sooner.  Performed at [facility], [place] [address], [place], [place] [address]   Culture, blood (routine x 2) Call MD if unable to obtain prior to antibiotics being given     Status: None (Preliminary result)   Collection Time: 12/31/20  8:42 AM   Specimen: BLOOD  Result Value Ref Range Status   Specimen Description BLOOD LEFT ANTECUBITAL  Final   Special Requests   Final    BOTTLES DRAWN AEROBIC AND ANAEROBIC Blood Culture results may not be optimal due to an inadequate volume of blood received in culture bottles   Culture   Final    NO GROWTH 2 DAYS Performed at [place] [address], [place], [place] [address]    Report Status PENDING  Incomplete  Culture, blood (routine x 2) Call MD if unable to obtain prior to antibiotics being given     Status: None (Preliminary result)   Collection Time: 12/31/20  9:14 AM   Specimen: BLOOD  Result Value Ref Range Status   Specimen Description BLOOD RIGHT ANTECUBITAL  Final   Special Requests   Final    BOTTLES DRAWN AEROBIC AND ANAEROBIC Blood Culture adequate volume   Culture   Final    NO GROWTH 2 DAYS Performed at [facility], [address], [place], [place] [address]    Report Status PENDING  Incomplete  Body fluid culture (includes gram stain)     Status: None   Collection Time: 12/31/20 11:18 AM   Specimen: Pleural Fluid  Result Value Ref Range Status   Specimen Description PLEURAL FLUID  Final   Special Requests NONE  Final   Gram Stain    Final    ABUNDANT WBC PRESENT,BOTH PMN AND MONONUCLEAR NO ORGANISMS SEEN    Culture   Final    NO GROWTH Performed at [facility], [place] [address] [place], [place] [address]    Report Status 01/03/2021 FINAL  Final  Acid Fast Smear (AFB)     Status: None   Collection Time: 12/31/20 11:18 AM   Specimen: Pleural; Respiratory  Result Value Ref Range Status   AFB Specimen Processing Concentration  Final   Acid Fast Smear Negative  Final  Comment: (NOTE) Performed At: [facility] [place], [place] [id number] Rush Farmer MD YB:[id number]    Source (AFB) PLEURAL  Final    Comment: FLUID Performed at [facility], [place] [address], [place], [place] [address]     Radiology Reports DG Chest 2 View  Result Date: 12/29/2020 CLINICAL DATA:  Cough. Increasing left lower rib pain. Mass 2 left lower lung. EXAM: CHEST - 2 VIEW COMPARISON:  Radiograph December 13 21.  CT chest 12/01/2020. FINDINGS: Persistent masslike opacity in the left lower lobe, which has increased since 11/09/2020 radiograph. Direct comparison with the 12/01/2020 CT chest is difficult given differences in modality. New small left pleural effusion. No visible pneumothorax. Cardiomediastinal silhouette is within normal limits. No evidence of acute osseous abnormality. IMPRESSION: Persistent masslike opacity in the left lower lobe with new small left pleural effusion. The opacity is increased relative to prior radiograph from 11/09/2020. Comparison with recent CT chest is limited given differences in modality. Differential considerations remain pneumonia versus malignancy and recommend follow-up chest CT as directed from the 12/01/2020 CT chest. Electronically Signed   By: Margaretha Sheffield MD   On: 12/29/2020 13:22   CT ANGIO CHEST PE W OR WO CONTRAST  Result Date: 12/31/2020 CLINICAL DATA:  Shortness of breath EXAM: CT ANGIOGRAPHY CHEST WITH CONTRAST TECHNIQUE: Multidetector CT imaging of the  chest was performed using the standard protocol during bolus administration of intravenous contrast. Multiplanar CT image reconstructions and MIPs were obtained to evaluate the vascular anatomy. CONTRAST:  157m OMNIPAQUE IOHEXOL 350 MG/ML SOLN COMPARISON:  CT 12/01/2020, 10/14/2020 FINDINGS: Cardiovascular: Satisfactory opacification the pulmonary arteries to the segmental level. No pulmonary artery filling defects are identified. Central pulmonary arteries are top-normal caliber. Cardiac size is top normal. No sizable pericardial effusion. There is however some inflammation in the adjacent paracardial/mediastinal fat which is likely secondary to the it extensive process in the left lung. The aorta is normal caliber. No acute luminal abnormality of the imaged aorta. No periaortic stranding or hemorrhage. Normal 3 vessel branching of the aortic arch. Proximal great vessels are unremarkable. No major venous abnormalities are seen. Mediastinum/Nodes: Stranding and thickening in the left anterior mediastinal/paracardial fat. No mediastinal free fluid or air. Normal thyroid gland and thoracic inlet. No acute abnormality of the trachea or esophagus. Scattered low-attenuation subcentimeter mediastinal and hilar adenopathy. Several larger subcarinal and left hilar nodes are present measuring up to, 1.5 cm and 1.2 cm respectively (7/189), quite similar to prior. There is some increasing left axillary adenopathy is present as well with some mild surrounding stranding which could reflect a reactive process. Lungs/Pleura: Increasing consolidative opacity centered in the left lower with hypoattenuation of the consolidated parenchyma suggestive of some underlying infection with scattered air lucencies, possibly developing cavitation (6/77). Development of a complex, loculated pleural effusion extending over the lung apex, into the fissures, and throughout the lung base. Suspect some reactive stranding of the adjacent mediastinal  fat as detailed above. Several areas suggest some associated pleural thickening which could reflect a developing empyema. Right lung remains essentially clear without visible right pleural effusion. No pneumothorax. Upper Abdomen: No acute abnormalities present in the visualized portions of the upper abdomen. Musculoskeletal: No acute osseous abnormality or suspicious osseous lesion. Multilevel degenerative changes are present in the imaged portions of the spine. No clear involvement of the chest wall. No erosive or destructive changes of the adjacent ribs. No worrisome chest wall masses or lesions. Review of the MIP images confirms the  above findings. IMPRESSION: 1. No pulmonary embolism is seen. 2. Increasing consolidative opacity centered in the left lower with hypoattenuation of the consolidated parenchyma suggestive of some underlying infection with scattered air lucencies, possibly developing cavitation or necrosis. Additional development of a complex, loculated pleural effusion extending over the lung apex, into the fissure, and throughout the lung base. Several areas suggest some associated pleural thickening/empyema. Continued follow-up to resolution is recommended as an underlying mass/malignancy is difficult to fully exclude. 3. Some increasing scattered subcentimeter hypoattenuating mediastinal, hilar and left axillary adenopathy, may be reactive though continued attention on follow-up imaging is warranted pending exclusion of malignancy. These results were called by telephone at the time of interpretation on 12/31/2020 at 6:16 am to provider [facility] , who verbally acknowledged these results. Electronically Signed   By: Lovena Le M.D.   On: 12/31/2020 06:17   DG CHEST PORT 1 VIEW  Result Date: 01/03/2021 CLINICAL DATA:  Chest pain. EXAM: PORTABLE CHEST 1 VIEW COMPARISON:  January 02, 2021. FINDINGS: Stable cardiomegaly. Right lung is clear. Stable position of left-sided chest tube is noted with  grossly stable left pleural effusion and associated atelectasis or infiltrate. Bony thorax is unremarkable. No pneumothorax is noted. IMPRESSION: Stable position of left-sided chest tube with grossly stable left pleural effusion and associated atelectasis or infiltrate. Electronically Signed   By: Marijo Conception M.D.   On: 01/03/2021 08:38   DG Chest Port 1 View  Result Date: 01/02/2021 CLINICAL DATA:  Shortness of breath, COVID-19 positive. EXAM: PORTABLE CHEST 1 VIEW COMPARISON:  January 01, 2021. FINDINGS: Stable cardiomegaly. No pneumothorax is noted. Stable position of left-sided chest tube. Right lung is clear. Loculated left pleural effusion noted on prior exam is significantly smaller currently. Stable left basilar atelectasis or infiltrate is noted. Bony thorax is unremarkable. IMPRESSION: Stable position of left-sided chest tube. Loculated left pleural effusion noted on prior exam is significantly smaller currently. Stable left basilar atelectasis or infiltrate is noted. Electronically Signed   By: Marijo Conception M.D.   On: 01/02/2021 09:53   DG CHEST PORT 1 VIEW  Result Date: 01/01/2021 CLINICAL DATA:  Pleural effusion, chest tube, COVID pneumonia EXAM: PORTABLE CHEST 1 VIEW COMPARISON:  12/31/2020 FINDINGS: Large, partially laterally loculated left pleural effusion has enlarged in size. Left basilar pigtail chest tube positioned inferomedially is unchanged. Interval development of mild mediastinal shift to the right. Right lung is clear. No pneumothorax. No pleural effusion on the right. Cardiac size within normal limits. IMPRESSION: Left chest tube unchanged. Progressive enlargement of a partially loculated left pleural effusion with developing mediastinal shift to the right. CT examination may be helpful to assess catheter position in relation to the developing pleural fluid. Electronically Signed   By: Fidela Salisbury MD   On: 01/01/2021 08:15   DG CHEST PORT 1 VIEW  Result Date:  12/31/2020 CLINICAL DATA:  Loculated pleural effusion. EXAM: PORTABLE CHEST 1 VIEW COMPARISON:  CT 12/31/2020.  Chest x-ray 12/29/2020. FINDINGS: Left chest tube noted over the left lower chest. Prominent left pleural effusion with possible loculation. Underlying left base atelectasis/infiltrate most likely present. No pneumothorax. Heart size stable. IMPRESSION: Left chest tube noted over the left lower chest. Prominent left pleural effusion with possible loculation. Underlying left base atelectasis/infiltrate most likely present. No pneumothorax. Electronically Signed   By: Marcello Moores  Register   On: 12/31/2020 11:43   VAS [place] LOWER EXTREMITY VENOUS (DVT)  Result Date: 01/03/2021  Lower Venous DVT Study Indications: Covid, elevated d-dimer.  Anticoagulation: Lovenox. Comparison Study: No prior studies. Performing Technologist: Darlin Coco RDMS  Examination Guidelines: A complete evaluation includes B-mode imaging, spectral Doppler, color Doppler, and power Doppler as needed of all accessible portions of each vessel. Bilateral testing is considered an integral part of a complete examination. Limited examinations for reoccurring indications may be performed as noted. The reflux portion of the exam is performed with the patient in reverse Trendelenburg.  +---------+---------------+---------+-----------+----------+--------------+ RIGHT    CompressibilityPhasicitySpontaneityPropertiesThrombus Aging +---------+---------------+---------+-----------+----------+--------------+ CFV      Full           Yes      Yes                                 +---------+---------------+---------+-----------+----------+--------------+ SFJ      Full                                                        +---------+---------------+---------+-----------+----------+--------------+ FV Prox  Full                                                         +---------+---------------+---------+-----------+----------+--------------+ FV Mid   Full                                                        +---------+---------------+---------+-----------+----------+--------------+ FV DistalFull                                                        +---------+---------------+---------+-----------+----------+--------------+ PFV      Full                                                        +---------+---------------+---------+-----------+----------+--------------+ POP      Full           Yes      Yes                                 +---------+---------------+---------+-----------+----------+--------------+ PTV      Full                                                        +---------+---------------+---------+-----------+----------+--------------+ PERO     Full                                                        +---------+---------------+---------+-----------+----------+--------------+   +---------+---------------+---------+-----------+----------+--------------+  LEFT     CompressibilityPhasicitySpontaneityPropertiesThrombus Aging +---------+---------------+---------+-----------+----------+--------------+ CFV      Full           Yes      Yes                                 +---------+---------------+---------+-----------+----------+--------------+ SFJ      Full                                                        +---------+---------------+---------+-----------+----------+--------------+ FV Prox  Full                                                        +---------+---------------+---------+-----------+----------+--------------+ FV Mid   Full                                                        +---------+---------------+---------+-----------+----------+--------------+ FV DistalFull                                                         +---------+---------------+---------+-----------+----------+--------------+ PFV      Full                                                        +---------+---------------+---------+-----------+----------+--------------+ POP      Full           Yes      Yes                                 +---------+---------------+---------+-----------+----------+--------------+ PTV      Full                                                        +---------+---------------+---------+-----------+----------+--------------+ PERO     Full                                                        +---------+---------------+---------+-----------+----------+--------------+     Summary: RIGHT: - There is no evidence of deep vein thrombosis in the lower extremity.  - No cystic structure found in the popliteal fossa.  LEFT: - There is no evidence of deep vein thrombosis in the lower extremity.  - No  cystic structure found in the popliteal fossa.  *See table(s) above for measurements and observations.    Preliminary

## 2021-01-03 NOTE — Procedures (Signed)
Pleural Fibrinolytic Administration Procedure Note  Joseph Fitzgerald  [id number]  1971/02/06  Date:01/03/21  Time:12:57 PM   Provider Performing:Joseph Fitzgerald L Joseph Fitzgerald   Procedure: Pleural Fibrinolysis Subsequent day (39767)  Indication(s) Fibrinolysis of complicated pleural effusion  Consent Risks of the procedure as well as the alternatives and risks of each were explained to the patient and/or caregiver.  Consent for the procedure was obtained.  Anesthesia None  Time Out Verified patient identification, verified procedure, site/side was marked, verified correct patient position, special equipment/implants available, medications/allergies/relevant history reviewed, required imaging and test results available.  Sterile Technique Hand hygiene, gloves  Procedure Description Existing pleural catheter was cleaned and accessed in sterile manner.  50m of tPA in 30cc of saline and 584mof dornase in 30cc of sterile water were injected into pleural space using existing pleural catheter.  Catheter will be clamped for 1 hour and then placed back to suction.  Complications/Tolerance None; patient tolerated the procedure well.  EBL None  Specimen(s) None   Joseph NashDO [name] 01/03/2021 12:57 PM

## 2021-01-04 ENCOUNTER — Inpatient Hospital Stay (HOSPITAL_COMMUNITY): Payer: 59

## 2021-01-04 ENCOUNTER — Ambulatory Visit: Payer: 59 | Admitting: Adult Health

## 2021-01-04 DIAGNOSIS — U071 COVID-19: Secondary | ICD-10-CM | POA: Diagnosis not present

## 2021-01-04 DIAGNOSIS — Z9689 Presence of other specified functional implants: Secondary | ICD-10-CM

## 2021-01-04 DIAGNOSIS — J90 Pleural effusion, not elsewhere classified: Secondary | ICD-10-CM | POA: Diagnosis not present

## 2021-01-04 LAB — COMPREHENSIVE METABOLIC PANEL
ALT: 16 U/L (ref 0–44)
AST: 20 U/L (ref 15–41)
Albumin: 1.8 g/dL — ABNORMAL LOW (ref 3.5–5.0)
Alkaline Phosphatase: 59 U/L (ref 38–126)
Anion gap: 9 (ref 5–15)
BUN: 8 mg/dL (ref 6–20)
CO2: 27 mmol/L (ref 22–32)
Calcium: 7.7 mg/dL — ABNORMAL LOW (ref 8.9–10.3)
Chloride: 100 mmol/L (ref 98–111)
Creatinine, Ser: 0.84 mg/dL (ref 0.61–1.24)
GFR, Estimated: >60 mL/min (ref >60)
Glucose, Bld: 107 mg/dL — ABNORMAL HIGH (ref 70–99)
Potassium: 3.3 mmol/L — ABNORMAL LOW (ref 3.5–5.1)
Sodium: 136 mmol/L (ref 135–145)
Total Bilirubin: 0.7 mg/dL (ref 0.3–1.2)
Total Protein: 6.1 g/dL — ABNORMAL LOW (ref 6.5–8.1)

## 2021-01-04 LAB — CBC WITH DIFFERENTIAL/PLATELET
Abs Immature Granulocytes: 0.04 10*3/uL (ref 0.00–0.07)
Basophils Absolute: 0 10*3/uL (ref 0.0–0.1)
Basophils Relative: 0 %
Eosinophils Absolute: 0.1 10*3/uL (ref 0.0–0.5)
Eosinophils Relative: 1 %
HCT: 36.5 % — ABNORMAL LOW (ref 39.0–52.0)
Hemoglobin: 12.4 g/dL — ABNORMAL LOW (ref 13.0–17.0)
Immature Granulocytes: 0 %
Lymphocytes Relative: 13 %
Lymphs Abs: 1.3 10*3/uL (ref 0.7–4.0)
MCH: 30.3 pg (ref 26.0–34.0)
MCHC: 34 g/dL (ref 30.0–36.0)
MCV: 89.2 fL (ref 80.0–100.0)
Monocytes Absolute: 1.1 10*3/uL — ABNORMAL HIGH (ref 0.1–1.0)
Monocytes Relative: 11 %
Neutro Abs: 7.5 10*3/uL (ref 1.7–7.7)
Neutrophils Relative %: 75 %
Platelets: 340 10*3/uL (ref 150–400)
RBC: 4.09 MIL/uL — ABNORMAL LOW (ref 4.22–5.81)
RDW: 13.2 % (ref 11.5–15.5)
WBC: 10.1 10*3/uL (ref 4.0–10.5)
nRBC: 0 % (ref 0.0–0.2)

## 2021-01-04 LAB — PHOSPHORUS: Phosphorus: 3.2 mg/dL (ref 2.5–4.6)

## 2021-01-04 LAB — D-DIMER, QUANTITATIVE: D-Dimer, Quant: 5.36 ug/mL-FEU — ABNORMAL HIGH (ref 0.00–0.50)

## 2021-01-04 LAB — FERRITIN: Ferritin: 642 ng/mL — ABNORMAL HIGH (ref 24–336)

## 2021-01-04 LAB — QUANTIFERON-TB GOLD PLUS

## 2021-01-04 LAB — C-REACTIVE PROTEIN: CRP: 22.2 mg/dL — ABNORMAL HIGH (ref <1.0)

## 2021-01-04 LAB — MAGNESIUM: Magnesium: 2.2 mg/dL (ref 1.7–2.4)

## 2021-01-04 MED ORDER — POTASSIUM CHLORIDE CRYS ER 20 MEQ PO TBCR
40.0000 meq | EXTENDED_RELEASE_TABLET | Freq: Once | ORAL | Status: AC
  Administered 2021-01-04: 40 meq via ORAL
  Filled 2021-01-04: qty 2

## 2021-01-04 NOTE — Progress Notes (Signed)
PROGRESS NOTE                                                                                                                                                                                                             Patient Demographics:    Joseph Fitzgerald, is a 50 y.o. male, DOB - 11-15-1971, JOA:[id number]  Outpatient Primary MD for the patient is Ladell Pier, MD   Admit date - 12/30/2020   LOS - 4  No chief complaint on file.      Brief Narrative: Patient is a 50 y.o. male who has been followed in the pulmonology clinic for a left lower lung mass-presented to the ED on 2/3 with left-sided chest pain-found to have increasing left lower lobe mass and a loculated pleural effusion.  Evaluated by PCCM-chest tube placed-and subsequently admitted to the hospitalist service.  See below for further details.  COVID-19 vaccinated status: Partially vaccinated  Significant Events: 2/4>> Admit to [facility] for loculated pleural effusion/increasing left lower lobe mass.  Incidental COVID  Significant studies: 2/3>> CTA chest: No PE, complex/loculated pleural effusion-increased left lower lobe mass 2/6>> bilateral lower extremity Doppler: No DVT 2/7>> CT chest without contrast: Diffuse volume and loculated pleural effusion, persistent consolidation with necrosis in the left lower lobe.  Mediastinal lymphadenopathy.  COVID-19 medications: Remdesivir: 2/3>> 2/5  Antibiotics: Levofloxacin: 2/3>> 2/4 Rocephin: 2/4>> Flagyl: 2/4>>  Microbiology data: 2/3 >>blood culture: No growth 2/3>> Gold Quant front: Pending 2/3>> pleural fluid AFB smear: Negative 2/3>> pleural fluid culture: Negative 2/3>> pleural fluid fungal culture/AFB culture: Pending  Procedures: 2/3>> chest tube placement by PCCM  Consults: PCCM  DVT prophylaxis: enoxaparin (LOVENOX) injection 40 mg Start: 12/31/20 0845    Subjective:   Lying comfortably in  bed-denies any chest pain or shortness of breath.   Assessment  & Plan :   Left cavitary mass with complicated/loculated pleural effusion-likely empyema: Chest tube in place-antibiotics as above-culture data as above-awaiting further recommendations from PCCM.   Poor dentition  COVID-19 infection: Incidental-asymptomatic-Remdesivir x3 days completed on 2/5-apart from watchful observation no further treatment required at this time.  Needs isolation for a total of 10 days from 2/2.  COVID-19 Labs: Recent Labs    01/02/21 0401 01/03/21 0404 01/04/21 0345  DDIMER 3.58* 5.93* 5.36*  FERRITIN 527* 640* 642*  CRP 28.1* 26.5* 22.2*  No results found for: BNP  Recent Labs  Lab 12/31/20 0906  PROCALCITON 1.25    Lab Results  Component Value Date   SARSCOV2NAA POSITIVE (A) 12/30/2020   [place] Not Detected 10/01/2020   [place] NEGATIVE 09/08/2019     Obesity: Estimated body mass index is 33.15 kg/m as calculated from the following:   Height as of this encounter: 5' (1.524 m).   Weight as of this encounter: 77 kg.     ABG:    Component Value Date/Time   TCO2 21 (L) 03/17/2018 0342    Vent Settings: N/A    Condition - Stable  Family Communication  :    Code Status :  Full Code  Diet :  Diet Order            Diet regular Room service appropriate? Yes; Fluid consistency: Thin  Diet effective now                  Disposition Plan  :   Status is: Inpatient  Remains inpatient appropriate because:Inpatient level of care appropriate due to severity of illness   Dispo: The patient is from: Home              Anticipated d/c is to: Home              Anticipated d/c date is: > 3 days              Patient currently is not medically stable to d/c.   Difficult to place patient No   Barriers to discharge: Chest tube in place-on IV antibiotics-May require bronchoscopy/CT surgery referral.   Antimicorbials  :    Anti-infectives (From admission,  onward)   Start     Dose/Rate Route Frequency Ordered Stop   01/01/21 1145  cefTRIAXone (ROCEPHIN) 2 g in sodium chloride 0.9 % 100 mL IVPB        2 g 200 mL/hr over 30 Minutes Intravenous Every 24 hours 01/01/21 1050     01/01/21 1145  metroNIDAZOLE (FLAGYL) tablet 500 mg        500 mg Oral Every 8 hours 01/01/21 1050     01/01/21 1000  remdesivir 100 mg in sodium chloride 0.9 % 100 mL IVPB       "Followed by" Linked Group Details   100 mg 200 mL/hr over 30 Minutes Intravenous Daily 12/31/20 0841 01/02/21 1040   12/31/20 1000  levofloxacin (LEVAQUIN) tablet 750 mg  Status:  Discontinued        750 mg Oral Daily 12/31/20 0841 01/01/21 1050   12/31/20 1000  remdesivir 200 mg in sodium chloride 0.9% 250 mL IVPB       "Followed by" Linked Group Details   200 mg 580 mL/hr over 30 Minutes Intravenous Once 12/31/20 0841 12/31/20 1256   12/31/20 0630  levofloxacin (LEVAQUIN) IVPB 500 mg  Status:  Discontinued        500 mg 100 mL/hr over 60 Minutes Intravenous  Once 12/31/20 9371 12/31/20 0841      Inpatient Medications  Scheduled Meds: . alteplase (TPA) for intrapleural administration  10 mg Intrapleural Daily   And  . pulmozyme (DORNASE) for intrapleural administration  5 mg Intrapleural Daily  . vitamin C  500 mg Oral Daily  . atorvastatin  10 mg Oral Daily  . enoxaparin (LOVENOX) injection  40 mg Subcutaneous Q24H  . fluticasone  2 spray Each Nare Daily  . fluticasone furoate-vilanterol  1 puff Inhalation Daily  .  metroNIDAZOLE  500 mg Oral Q8H  . sodium chloride flush  10 mL Intracatheter Q8H  . zinc sulfate  220 mg Oral Daily   Continuous Infusions: . sodium chloride 10 mL/hr at 01/01/21 1108  . cefTRIAXone (ROCEPHIN)  IV 2 g (01/03/21 1202)   PRN Meds:.acetaminophen, albuterol, benzonatate, ibuprofen, morphine injection, ondansetron (ZOFRAN) IV, oxyCODONE   Time Spent in minutes  15  See all Orders from today for further details   Oren Binet M.D on 01/04/2021 at  12:20 PM  To page go to [email or web address] - use universal password  [facility] -  Office  [phone]    Objective:   Vitals:   01/02/21 0402 01/02/21 1234 01/03/21 1425 01/03/21 2003  BP: 91/64 98/69 93/60  99/67  Pulse: 89 90 88 100  Resp: 16 16 20 18   Temp: 99 F (37.2 C) 99.8 F (37.7 C) 99.6 F (37.6 C) 100 F (37.8 C)  TempSrc: Oral Oral Oral Oral  SpO2: 94%   94%  Weight: 77 kg     Height:        Wt Readings from Last 3 Encounters:  01/02/21 77 kg  12/07/20 76.5 kg  11/13/20 76.7 kg     Intake/Output Summary (Last 24 hours) at 01/04/2021 1220 Last data filed at 01/04/2021 0931 Gross per 24 hour  Intake 150 ml  Output 1150 ml  Net -1000 ml     Physical Exam Gen Exam:Alert awake-not in any distress HEENT:atraumatic, normocephalic Chest: B/L clear to auscultation anteriorly CVS:S1S2 regular Abdomen:soft non tender, non distended Extremities:no edema Neurology: Non focal Skin: no rash   Data Review:    CBC Recent Labs  Lab 12/30/20 2254 01/01/21 0025 01/02/21 0401 01/03/21 0404 01/04/21 0345  WBC 12.3* 16.6* 20.0* 15.8* 10.1  HGB 14.4 13.3 11.8* 12.3* 12.4*  HCT 40.8 41.0 36.3* 34.6* 36.5*  PLT 478* 383 319 324 340  MCV 88.5 89.7 89.0 87.6 89.2  MCH 31.2 29.1 28.9 31.1 30.3  MCHC 35.3 32.4 32.5 35.5 34.0  RDW 12.5 12.7 12.9 13.0 13.2  LYMPHSABS 1.7 1.1 1.3 1.1 1.3  MONOABS 0.8 1.7* 1.7* 1.5* 1.1*  EOSABS 0.1 0.1 0.0 0.1 0.1  BASOSABS 0.1 0.1 0.1 0.0 0.0    Chemistries  Recent Labs  Lab 12/30/20 2254 01/01/21 0025 01/02/21 0401 01/03/21 0404 01/04/21 0345  NA 136 136 134* 135 136  K 3.8 3.8 3.6 3.4* 3.3*  CL 102 101 101 100 100  CO2 20* 25 24 26 27   GLUCOSE 143* 111* 116* 114* 107*  BUN 9 9 9 10 8   CREATININE 0.83 1.00 0.88 0.75 0.84  CALCIUM 8.8* 8.4* 8.0* 7.8* 7.7*  MG  --  1.8 2.0 1.9 2.2  AST 29 19 15 18 20   ALT 30 24 17 16 16   ALKPHOS 90 87 64 68 59  BILITOT 0.9 1.3* 0.7 0.5 0.7    ------------------------------------------------------------------------------------------------------------------ No results for input(s): CHOL, HDL, LDLCALC, TRIG, CHOLHDL, LDLDIRECT in the last 72 hours.  Lab Results  Component Value Date   HGBA1C 5.4 10/17/2019   ------------------------------------------------------------------------------------------------------------------ No results for input(s): TSH, T4TOTAL, T3FREE, THYROIDAB in the last 72 hours.  Invalid input(s): FREET3 ------------------------------------------------------------------------------------------------------------------ Recent Labs    01/03/21 0404 01/04/21 0345  FERRITIN 640* 642*    Coagulation profile No results for input(s): INR, PROTIME in the last 168 hours.  Recent Labs    01/03/21 0404 01/04/21 0345  DDIMER 5.93* 5.36*    Cardiac Enzymes No results for input(s): CKMB, TROPONINI, MYOGLOBIN in  the last 168 hours.  Invalid input(s): CK ------------------------------------------------------------------------------------------------------------------ No results found for: BNP  Micro Results Recent Results (from the past 240 hour(s))  SARS Coronavirus 2 by RT PCR (hospital order, performed in [facility] hospital lab) Nasopharyngeal Nasopharyngeal Swab     Status: Abnormal   Collection Time: 12/30/20 10:50 PM   Specimen: Nasopharyngeal Swab  Result Value Ref Range Status   SARS Coronavirus 2 POSITIVE (A) NEGATIVE Final    Comment: RESULT CALLED TO, READ BACK BY AND VERIFIED WITH: C CRISCO RN 12/31/20 0044 JDW (NOTE) SARS-CoV-2 target nucleic acids are DETECTED  SARS-CoV-2 RNA is generally detectable in upper respiratory specimens  during the acute phase of infection.  Positive results are indicative  of the presence of the identified virus, but do not rule out bacterial infection or co-infection with other pathogens not detected by the test.  Clinical correlation with patient history  and  other diagnostic information is necessary to determine patient infection status.  The expected result is negative.  Fact Sheet for Patients:   StrictlyIdeas.no   Fact Sheet for Healthcare Providers:   BankingDealers.co.za    This test is not yet approved or cleared by the [place] FDA and  has been authorized for detection and/or diagnosis of SARS-CoV-2 by FDA under an Emergency Use Authorization (EUA).  This EUA will remain in effect (meaning this test can  be used) for the duration of  the COVID-19 declaration under Section 564(b)(1) of the Act, 21 U.S.C. section 360-bbb-3(b)(1), unless the authorization is terminated or revoked sooner.  Performed at [facility], [place] [address], [place], [place] [address]   Culture, blood (routine x 2) Call MD if unable to obtain prior to antibiotics being given     Status: None (Preliminary result)   Collection Time: 12/31/20  8:42 AM   Specimen: BLOOD  Result Value Ref Range Status   Specimen Description BLOOD LEFT ANTECUBITAL  Final   Special Requests   Final    BOTTLES DRAWN AEROBIC AND ANAEROBIC Blood Culture results may not be optimal due to an inadequate volume of blood received in culture bottles   Culture   Final    NO GROWTH 4 DAYS Performed at [facility], [place] [address] [place], [place] [address]    Report Status PENDING  Incomplete  Culture, blood (routine x 2) Call MD if unable to obtain prior to antibiotics being given     Status: None (Preliminary result)   Collection Time: 12/31/20  9:14 AM   Specimen: BLOOD  Result Value Ref Range Status   Specimen Description BLOOD RIGHT ANTECUBITAL  Final   Special Requests   Final    BOTTLES DRAWN AEROBIC AND ANAEROBIC Blood Culture adequate volume   Culture   Final    NO GROWTH 4 DAYS Performed at [facility], [address] [place], [place] [address]    Report Status PENDING  Incomplete  Body fluid  culture (includes gram stain)     Status: None   Collection Time: 12/31/20 11:18 AM   Specimen: Pleural Fluid  Result Value Ref Range Status   Specimen Description PLEURAL FLUID  Final   Special Requests NONE  Final   Gram Stain   Final    ABUNDANT WBC PRESENT,BOTH PMN AND MONONUCLEAR NO ORGANISMS SEEN    Culture   Final    NO GROWTH Performed at [facility], [place] [address], [place], [place] [address]    Report  Status 01/03/2021 FINAL  Final  Acid Fast Smear (AFB)     Status: None   Collection Time: 12/31/20 11:18 AM   Specimen: Pleural; Respiratory  Result Value Ref Range Status   AFB Specimen Processing Concentration  Final   Acid Fast Smear Negative  Final    Comment: (NOTE) Performed At: [facility] [place], [place] [id number] Rush Farmer MD [id number]    Source (AFB) PLEURAL  Final    Comment: FLUID Performed at [facility], [place] [address], [place], [place] [address]     Radiology Reports DG Chest 2 View  Result Date: 12/29/2020 CLINICAL DATA:  Cough. Increasing left lower rib pain. Mass 2 left lower lung. EXAM: CHEST - 2 VIEW COMPARISON:  Radiograph December 13 21.  CT chest 12/01/2020. FINDINGS: Persistent masslike opacity in the left lower lobe, which has increased since 11/09/2020 radiograph. Direct comparison with the 12/01/2020 CT chest is difficult given differences in modality. New small left pleural effusion. No visible pneumothorax. Cardiomediastinal silhouette is within normal limits. No evidence of acute osseous abnormality. IMPRESSION: Persistent masslike opacity in the left lower lobe with new small left pleural effusion. The opacity is increased relative to prior radiograph from 11/09/2020. Comparison with recent CT chest is limited given differences in modality. Differential considerations remain pneumonia versus malignancy and recommend follow-up chest CT as directed from the 12/01/2020 CT chest. Electronically Signed    By: Margaretha Sheffield MD   On: 12/29/2020 13:22   CT CHEST WO CONTRAST  Result Date: 01/04/2021 CLINICAL DATA:  50 year old with loculated left pleural effusion and chest tube. Patient has undergone pleural fibrinolytic therapy. EXAM: CT CHEST WITHOUT CONTRAST TECHNIQUE: Multidetector CT imaging of the chest was performed following the standard protocol without IV contrast. COMPARISON:  Chest CT 12/31/2020 and chest radiograph 01/03/2021 and chest CT 12/01/2020 FINDINGS: Cardiovascular: Normal caliber of the thoracic aorta without atherosclerotic calcifications. Small pericardial effusion which appears to be slightly increased. Heart size is normal. Mediastinum/Nodes: Prominent subcarinal tissue measuring up to 1.8 cm in short axis on sequence 3, image 27 and similar to the recent comparison examination. However, subcarinal tissue only measured 1.1 cm in the short axis on 12/01/2020. Prominent right paratracheal lymph node measuring 0.9 cm in the short axis on sequence 3, image 20. Again noted are multiple small left axillary lymph nodes. Lungs/Pleura: Left posterior pigtail drain in the pleural space. The drain was placed along the medial aspect of the left lower chest. Overall, the volume of pleural fluid has decreased, particularly in the left lower chest. There is a small amount of residual pleural thickening or pleural fluid in the left lower chest. Residual loculated pleural fluid in the lingular region. Multiple pockets of pleural air. Residual small pockets of loculated pleural fluid near the superior segment of the left lower lobe. Persistent area of consolidation in the left lower lobe with evidence of central necrosis on sequence 6, image 81. This area roughly measures 4.8 x 4.9 cm. This area roughly measured 5.5 x 3.5 cm on 12/01/2020. Patchy densities throughout the left lower lung compatible with areas of consolidation and volume loss. Right lung is clear. Negative for a right pleural effusion.  Upper Abdomen: Images of the upper abdomen are unremarkable. Musculoskeletal: No acute bone abnormality. IMPRESSION: 1. Loculated left pleural effusion has decreased in volume. There continues to be scattered areas of loculated left pleural fluid with pockets of pleural air. Chest tube is positioned in the medial posterior left chest.  2. Persistent consolidation with necrosis in the left lower lobe. This area has persisted since 12/01/2020. Findings are concerning for necrotic pneumonia but an underlying malignancy can not be excluded. 3. Mediastinal lymphadenopathy. This lymphadenopathy has not significantly changed since the recent comparison examination but has increased since 12/01/2020. This lymphadenopathy could be reactive but indeterminate. 4. Small but slightly increased pericardial effusion. Electronically Signed   By: Markus Daft M.D.   On: 01/04/2021 08:30   CT ANGIO CHEST PE W OR WO CONTRAST  Result Date: 12/31/2020 CLINICAL DATA:  Shortness of breath EXAM: CT ANGIOGRAPHY CHEST WITH CONTRAST TECHNIQUE: Multidetector CT imaging of the chest was performed using the standard protocol during bolus administration of intravenous contrast. Multiplanar CT image reconstructions and MIPs were obtained to evaluate the vascular anatomy. CONTRAST:  160m OMNIPAQUE IOHEXOL 350 MG/ML SOLN COMPARISON:  CT 12/01/2020, 10/14/2020 FINDINGS: Cardiovascular: Satisfactory opacification the pulmonary arteries to the segmental level. No pulmonary artery filling defects are identified. Central pulmonary arteries are top-normal caliber. Cardiac size is top normal. No sizable pericardial effusion. There is however some inflammation in the adjacent paracardial/mediastinal fat which is likely secondary to the it extensive process in the left lung. The aorta is normal caliber. No acute luminal abnormality of the imaged aorta. No periaortic stranding or hemorrhage. Normal 3 vessel branching of the aortic arch. Proximal great  vessels are unremarkable. No major venous abnormalities are seen. Mediastinum/Nodes: Stranding and thickening in the left anterior mediastinal/paracardial fat. No mediastinal free fluid or air. Normal thyroid gland and thoracic inlet. No acute abnormality of the trachea or esophagus. Scattered low-attenuation subcentimeter mediastinal and hilar adenopathy. Several larger subcarinal and left hilar nodes are present measuring up to, 1.5 cm and 1.2 cm respectively (7/189), quite similar to prior. There is some increasing left axillary adenopathy is present as well with some mild surrounding stranding which could reflect a reactive process. Lungs/Pleura: Increasing consolidative opacity centered in the left lower with hypoattenuation of the consolidated parenchyma suggestive of some underlying infection with scattered air lucencies, possibly developing cavitation (6/77). Development of a complex, loculated pleural effusion extending over the lung apex, into the fissures, and throughout the lung base. Suspect some reactive stranding of the adjacent mediastinal fat as detailed above. Several areas suggest some associated pleural thickening which could reflect a developing empyema. Right lung remains essentially clear without visible right pleural effusion. No pneumothorax. Upper Abdomen: No acute abnormalities present in the visualized portions of the upper abdomen. Musculoskeletal: No acute osseous abnormality or suspicious osseous lesion. Multilevel degenerative changes are present in the imaged portions of the spine. No clear involvement of the chest wall. No erosive or destructive changes of the adjacent ribs. No worrisome chest wall masses or lesions. Review of the MIP images confirms the above findings. IMPRESSION: 1. No pulmonary embolism is seen. 2. Increasing consolidative opacity centered in the left lower with hypoattenuation of the consolidated parenchyma suggestive of some underlying infection with scattered  air lucencies, possibly developing cavitation or necrosis. Additional development of a complex, loculated pleural effusion extending over the lung apex, into the fissure, and throughout the lung base. Several areas suggest some associated pleural thickening/empyema. Continued follow-up to resolution is recommended as an underlying mass/malignancy is difficult to fully exclude. 3. Some increasing scattered subcentimeter hypoattenuating mediastinal, hilar and left axillary adenopathy, may be reactive though continued attention on follow-up imaging is warranted pending exclusion of malignancy. These results were called by telephone at the time of interpretation on 12/31/2020 at 6:16 am to  provider [facility] , who verbally acknowledged these results. Electronically Signed   By: Lovena Le M.D.   On: 12/31/2020 06:17   DG CHEST PORT 1 VIEW  Result Date: 01/03/2021 CLINICAL DATA:  Chest pain. EXAM: PORTABLE CHEST 1 VIEW COMPARISON:  January 02, 2021. FINDINGS: Stable cardiomegaly. Right lung is clear. Stable position of left-sided chest tube is noted with grossly stable left pleural effusion and associated atelectasis or infiltrate. Bony thorax is unremarkable. No pneumothorax is noted. IMPRESSION: Stable position of left-sided chest tube with grossly stable left pleural effusion and associated atelectasis or infiltrate. Electronically Signed   By: Marijo Conception M.D.   On: 01/03/2021 08:38   DG Chest Port 1 View  Result Date: 01/02/2021 CLINICAL DATA:  Shortness of breath, COVID-19 positive. EXAM: PORTABLE CHEST 1 VIEW COMPARISON:  January 01, 2021. FINDINGS: Stable cardiomegaly. No pneumothorax is noted. Stable position of left-sided chest tube. Right lung is clear. Loculated left pleural effusion noted on prior exam is significantly smaller currently. Stable left basilar atelectasis or infiltrate is noted. Bony thorax is unremarkable. IMPRESSION: Stable position of left-sided chest tube. Loculated left  pleural effusion noted on prior exam is significantly smaller currently. Stable left basilar atelectasis or infiltrate is noted. Electronically Signed   By: Marijo Conception M.D.   On: 01/02/2021 09:53   DG CHEST PORT 1 VIEW  Result Date: 01/01/2021 CLINICAL DATA:  Pleural effusion, chest tube, COVID pneumonia EXAM: PORTABLE CHEST 1 VIEW COMPARISON:  12/31/2020 FINDINGS: Large, partially laterally loculated left pleural effusion has enlarged in size. Left basilar pigtail chest tube positioned inferomedially is unchanged. Interval development of mild mediastinal shift to the right. Right lung is clear. No pneumothorax. No pleural effusion on the right. Cardiac size within normal limits. IMPRESSION: Left chest tube unchanged. Progressive enlargement of a partially loculated left pleural effusion with developing mediastinal shift to the right. CT examination may be helpful to assess catheter position in relation to the developing pleural fluid. Electronically Signed   By: Fidela Salisbury MD   On: 01/01/2021 08:15   DG CHEST PORT 1 VIEW  Result Date: 12/31/2020 CLINICAL DATA:  Loculated pleural effusion. EXAM: PORTABLE CHEST 1 VIEW COMPARISON:  CT 12/31/2020.  Chest x-ray 12/29/2020. FINDINGS: Left chest tube noted over the left lower chest. Prominent left pleural effusion with possible loculation. Underlying left base atelectasis/infiltrate most likely present. No pneumothorax. Heart size stable. IMPRESSION: Left chest tube noted over the left lower chest. Prominent left pleural effusion with possible loculation. Underlying left base atelectasis/infiltrate most likely present. No pneumothorax. Electronically Signed   By: Marcello Moores  Register   On: 12/31/2020 11:43   VAS [place] LOWER EXTREMITY VENOUS (DVT)  Result Date: 01/03/2021  Lower Venous DVT Study Indications: Covid, elevated d-dimer.  Anticoagulation: Lovenox. Comparison Study: No prior studies. Performing Technologist: Darlin Coco RDMS  Examination Guidelines:  A complete evaluation includes B-mode imaging, spectral Doppler, color Doppler, and power Doppler as needed of all accessible portions of each vessel. Bilateral testing is considered an integral part of a complete examination. Limited examinations for reoccurring indications may be performed as noted. The reflux portion of the exam is performed with the patient in reverse Trendelenburg.  +---------+---------------+---------+-----------+----------+--------------+ RIGHT    CompressibilityPhasicitySpontaneityPropertiesThrombus Aging +---------+---------------+---------+-----------+----------+--------------+ CFV      Full           Yes      Yes                                 +---------+---------------+---------+-----------+----------+--------------+  SFJ      Full                                                        +---------+---------------+---------+-----------+----------+--------------+ FV Prox  Full                                                        +---------+---------------+---------+-----------+----------+--------------+ FV Mid   Full                                                        +---------+---------------+---------+-----------+----------+--------------+ FV DistalFull                                                        +---------+---------------+---------+-----------+----------+--------------+ PFV      Full                                                        +---------+---------------+---------+-----------+----------+--------------+ POP      Full           Yes      Yes                                 +---------+---------------+---------+-----------+----------+--------------+ PTV      Full                                                        +---------+---------------+---------+-----------+----------+--------------+ PERO     Full                                                         +---------+---------------+---------+-----------+----------+--------------+   +---------+---------------+---------+-----------+----------+--------------+ LEFT     CompressibilityPhasicitySpontaneityPropertiesThrombus Aging +---------+---------------+---------+-----------+----------+--------------+ CFV      Full           Yes      Yes                                 +---------+---------------+---------+-----------+----------+--------------+ SFJ      Full                                                        +---------+---------------+---------+-----------+----------+--------------+  FV Prox  Full                                                        +---------+---------------+---------+-----------+----------+--------------+ FV Mid   Full                                                        +---------+---------------+---------+-----------+----------+--------------+ FV DistalFull                                                        +---------+---------------+---------+-----------+----------+--------------+ PFV      Full                                                        +---------+---------------+---------+-----------+----------+--------------+ POP      Full           Yes      Yes                                 +---------+---------------+---------+-----------+----------+--------------+ PTV      Full                                                        +---------+---------------+---------+-----------+----------+--------------+ PERO     Full                                                        +---------+---------------+---------+-----------+----------+--------------+     Summary: RIGHT: - There is no evidence of deep vein thrombosis in the lower extremity.  - No cystic structure found in the popliteal fossa.  LEFT: - There is no evidence of deep vein thrombosis in the lower extremity.  - No cystic structure found in the popliteal fossa.   *See table(s) above for measurements and observations. Electronically signed by Monica Martinez MD on 01/03/2021 at 2:28:49 PM.    Final

## 2021-01-04 NOTE — Procedures (Signed)
Pleural Fibrinolytic Administration Procedure Note  Joseph Fitzgerald  [id number]  November 29, 1970  Date:01/04/21  Time:3:52 PM   Provider Performing:Nevena Rozenberg R Kerrigan Gombos   Procedure: Pleural Fibrinolysis Subsequent day (24462)  Indication(s) Fibrinolysis of complicated pleural effusion  Consent Risks of the procedure as well as the alternatives and risks of each were explained to the patient and/or caregiver.  Consent for the procedure was obtained.   Anesthesia None   Time Out Verified patient identification, verified procedure, site/side was marked, verified correct patient position, special equipment/implants available, medications/allergies/relevant history reviewed, required imaging and test results available.   Sterile Technique Hand hygiene, gloves   Procedure Description Existing pleural catheter was cleaned and accessed in sterile manner.  47m of tPA in 30cc of saline and 531mof dornase in 30cc of sterile water were injected into pleural space using existing pleural catheter.  Catheter will be clamped for 1 hour and then placed back to suction at 4:8:63OT Complications/Tolerance None; patient tolerated the procedure well.  EBL None   Specimen(s) None

## 2021-01-04 NOTE — Progress Notes (Signed)
Chest tube management container changed (203m total). Total output since beginning of shift (0700) is 1049m

## 2021-01-04 NOTE — Progress Notes (Addendum)
NAME:  Joseph Fitzgerald, MRN:  [id number], DOB:  [birth year], LOS: 4 ADMISSION DATE:  12/30/2020, CONSULTATION DATE:  2/3 REFERRING MD:  Lorin Mercy, CHIEF COMPLAINT:  Chest pain   Brief History:  50 y/o male who has been followed in the pulmonology clinic for a left lower lobe mass felt to be of infectious etiology presented to the [facility] emergency department on January 01, 2020 complaining of left chest pain.  Noted to have an increased size of the mass with an associated pleural effusion.  History of Present Illness:  This is a 50 year old male with minimal past medical history who was initially seen in the pulmonology clinic in late 2021 after he developed left-sided chest pain and was seen in urgent care.  He had a chest x-ray which showed a mass with likely cavitation.  He was treated with oral antibiotics for over a month and in January 2022 he was noted to have decreasing size of the mass and symptoms had improved.  Of note, the patient has very poor dentition, previously had no dental insurance and had pulled most of his teeth by himself when he had dental problems.  However on December 29, 2020 he presented to urgent care with a complaint of left-sided chest pain again.  He was treated with antibiotics, chest x-ray showed increasing size of the mass and he was referred back to pulmonology.  Plans are being made for bronchoscopic evaluation.  He returned to the [facility] emergency department on December 31, 2020 complaining of left-sided chest pain, worsening dyspnea, noted to be febrile.  The dyspnea and chest pain started on Sunday January 30.  A CT angiogram chest was performed which showed increasing size of the necrotic mass and a moderate size loculated pleural effusion.  Cigarette smoking> noted in his history but he denies when I ask.  He was found to be COVID positive.  He states that he had his first vaccine on 1/15, doesn't know the brand.  He has had symptoms of headache and mild body aches.     Past Medical History:  Smoker? Poor dentition Mass of lower lobe of left lung Marijuana abuse Asthma Aortic atherosclerosis  Significant Hospital Events:  2/3 admit 2/7 Repeat CT with persistent   Consults:  PCCM  Procedures:    Significant Diagnostic Tests:  2/3 CT Angiogram chest > right lung without evidence of pulmonary parenchymal abnormality, left lung with large left lower lobe mass, and cavitation, partially loculated anterior fissure effusion in 2 separate locations: 1 in the left base and a separate in the superior thorax, bulky adenopathy noted in the subcarinal, left pretracheal, and left hilar areas.   2/7 CT chest>> 1. Loculated left pleural effusion has decreased in volume. There continues to be scattered areas of loculated left pleural fluid with pockets of pleural air. Chest tube is positioned in the medial posterior left chest. 2. Persistent consolidation with necrosis in the left lower lobe. This area has persisted since 12/01/2020. Findings are concerning for necrotic pneumonia but an underlying malignancy can not be excluded. 3. Mediastinal lymphadenopathy. This lymphadenopathy has not significantly changed since the recent comparison examination but has increased since 12/01/2020. This lymphadenopathy could be reactive but indeterminate. 4. Small but slightly increased pericardial effusion.  Micro Data:  2/3 sars cov 2 > positive 2/3 blood >   Antimicrobials:  2/3 levaquin >  2/3 remdesivir >  Interim History / Subjective:  On room air in no distress  Objective   Blood pressure  99/67, pulse 100, temperature 100 F (37.8 C), temperature source Oral, resp. rate 18, height 5' (1.524 m), weight 77 kg, SpO2 94 %.        Intake/Output Summary (Last 24 hours) at 01/04/2021 1434 Last data filed at 01/04/2021 0931 Gross per 24 hour  Intake 30 ml  Output 550 ml  Net -520 ml   Filed Weights   12/31/20 1530 01/02/21 0402  Weight: 76.5 kg 77 kg     General: Well-nourished, well-appearing male awake and in no distress HEENT: MM pink/moist, room air Neuro: Awake, oriented x3 and conversational CV: s1s2 RRR, no m/r/g PULM: Left-sided chest tube in place with blood-tinged serosanguineous drainage, no significant rhonchi or wheezing GI: soft, bsx4 active  Extremities: warm/dry, no edema  Skin: no rashes or lesions   Resolved Hospital Problem list     Assessment & Plan:   Cavitary pneumonia with associate pleural effusion in a male with poor dentition: Favor infectious etiology, now with likely empyema though malignancy is possible.  Pleural fluid studies consistent with inflammatory process. Leukocytosis improving Plan: -Continue pigtail chest tube repeat TPA/DNase today, approximately 200 cc out this shift -Repeat CT chest this a.m. with persistent left loculated pleural effusions, CT surgery consult to evaluate whether VATS/decortication indicated -fungal and sputum cultures pending, continue ceftriaxone and Flagyl -pain control   Poor dentition Plan: Outpatient dental referral   COVID-19 infection Incidental, remdesivir x3 days. -clinically doing fairly well   Best practice (evaluated daily)   Per [facility]  Goals of Care:   Per [facility]  Labs   CBC: Recent Labs  Lab 12/30/20 2254 01/01/21 0025 01/02/21 0401 01/03/21 0404 01/04/21 0345  WBC 12.3* 16.6* 20.0* 15.8* 10.1  NEUTROABS 9.6* 13.7* 16.7* 13.1* 7.5  HGB 14.4 13.3 11.8* 12.3* 12.4*  HCT 40.8 41.0 36.3* 34.6* 36.5*  MCV 88.5 89.7 89.0 87.6 89.2  PLT 478* 383 319 324 048    Basic Metabolic Panel: Recent Labs  Lab 12/30/20 2254 01/01/21 0025 01/02/21 0401 01/03/21 0404 01/04/21 0345  NA 136 136 134* 135 136  K 3.8 3.8 3.6 3.4* 3.3*  CL 102 101 101 100 100  CO2 20* 25 24 26 27   GLUCOSE 143* 111* 116* 114* 107*  BUN 9 9 9 10 8   CREATININE 0.83 1.00 0.88 0.75 0.84  CALCIUM 8.8* 8.4* 8.0* 7.8* 7.7*  MG  --  1.8 2.0 1.9 2.2  PHOS  --  1.9* 2.8  2.6 3.2   GFR: Estimated Creatinine Clearance: 93.5 mL/min (by C-G formula based on SCr of 0.84 mg/dL). Recent Labs  Lab 12/30/20 2253 12/30/20 2254 12/31/20 0549 12/31/20 0906 01/01/21 0025 01/02/21 0401 01/03/21 0404 01/04/21 0345  PROCALCITON  --   --   --  1.25  --   --   --   --   WBC  --    < >  --   --  16.6* 20.0* 15.8* 10.1  LATICACIDVEN 1.1  --  1.3  --   --   --   --   --    < > = values in this interval not displayed.    Liver Function Tests: Recent Labs  Lab 12/30/20 2254 01/01/21 0025 01/02/21 0401 01/03/21 0404 01/04/21 0345  AST 29 19 15 18 20   ALT 30 24 17 16 16   ALKPHOS 90 87 64 68 59  BILITOT 0.9 1.3* 0.7 0.5 0.7  PROT 8.3* 7.4 5.9* 6.0* 6.1*  ALBUMIN 2.7* 2.3* 1.8* 1.8* 1.8*   No  results for input(s): LIPASE, AMYLASE in the last 168 hours. No results for input(s): AMMONIA in the last 168 hours.  ABG    Component Value Date/Time   TCO2 21 (L) 03/17/2018 0342     Coagulation Profile: No results for input(s): INR, PROTIME in the last 168 hours.  Cardiac Enzymes: No results for input(s): CKTOTAL, CKMB, CKMBINDEX, TROPONINI in the last 168 hours.  HbA1C: HbA1c POC (<> result, manual entry)  Date/Time Value Ref Range Status  10/17/2019 03:00 PM 5.4 4.0 - 5.6 % Final   Hgb A1c MFr Bld  Date/Time Value Ref Range Status  03/17/2018 03:34 AM 5.1 4.8 - 5.6 % Final    Comment:    (NOTE) Pre diabetes:          5.7%-6.4% Diabetes:              >6.4% Glycemic control for   <7.0% adults with diabetes   08/14/2016 08:09 AM 5.4 4.8 - 5.6 % Final    Comment:    (NOTE)         Pre-diabetes: 5.7 - 6.4         Diabetes: >6.4         Glycemic control for adults with diabetes: <7.0     CBG: No results for input(s): GLUCAP in the last 168 hours.   Critical care time 30 minutes   Otilio Carpen Lecia Esperanza, PA-C [facility] See Amion for pager If no response to pager , please call [phone] until 7pm After 7:00 pm call Elink   [phone]?[place]

## 2021-01-05 ENCOUNTER — Inpatient Hospital Stay (HOSPITAL_COMMUNITY): Payer: 59

## 2021-01-05 DIAGNOSIS — Z9689 Presence of other specified functional implants: Secondary | ICD-10-CM | POA: Diagnosis not present

## 2021-01-05 DIAGNOSIS — U071 COVID-19: Secondary | ICD-10-CM | POA: Diagnosis not present

## 2021-01-05 DIAGNOSIS — J90 Pleural effusion, not elsewhere classified: Secondary | ICD-10-CM | POA: Diagnosis not present

## 2021-01-05 LAB — CBC WITH DIFFERENTIAL/PLATELET
Abs Immature Granulocytes: 0.06 10*3/uL (ref 0.00–0.07)
Basophils Absolute: 0 10*3/uL (ref 0.0–0.1)
Basophils Relative: 0 %
Eosinophils Absolute: 0.1 10*3/uL (ref 0.0–0.5)
Eosinophils Relative: 1 %
HCT: 36 % — ABNORMAL LOW (ref 39.0–52.0)
Hemoglobin: 12.6 g/dL — ABNORMAL LOW (ref 13.0–17.0)
Immature Granulocytes: 1 %
Lymphocytes Relative: 9 %
Lymphs Abs: 1 10*3/uL (ref 0.7–4.0)
MCH: 30.4 pg (ref 26.0–34.0)
MCHC: 35 g/dL (ref 30.0–36.0)
MCV: 87 fL (ref 80.0–100.0)
Monocytes Absolute: 1.3 10*3/uL — ABNORMAL HIGH (ref 0.1–1.0)
Monocytes Relative: 12 %
Neutro Abs: 8.1 10*3/uL — ABNORMAL HIGH (ref 1.7–7.7)
Neutrophils Relative %: 77 %
Platelets: 367 10*3/uL (ref 150–400)
RBC: 4.14 MIL/uL — ABNORMAL LOW (ref 4.22–5.81)
RDW: 13.3 % (ref 11.5–15.5)
WBC: 10.5 10*3/uL (ref 4.0–10.5)
nRBC: 0 % (ref 0.0–0.2)

## 2021-01-05 LAB — CULTURE, BLOOD (ROUTINE X 2)
Culture: NO GROWTH
Culture: NO GROWTH
Special Requests: ADEQUATE

## 2021-01-05 LAB — COMPREHENSIVE METABOLIC PANEL
ALT: 16 U/L (ref 0–44)
AST: 22 U/L (ref 15–41)
Albumin: 1.8 g/dL — ABNORMAL LOW (ref 3.5–5.0)
Alkaline Phosphatase: 56 U/L (ref 38–126)
Anion gap: 9 (ref 5–15)
BUN: 10 mg/dL (ref 6–20)
CO2: 26 mmol/L (ref 22–32)
Calcium: 7.8 mg/dL — ABNORMAL LOW (ref 8.9–10.3)
Chloride: 100 mmol/L (ref 98–111)
Creatinine, Ser: 0.72 mg/dL (ref 0.61–1.24)
GFR, Estimated: >60 mL/min (ref >60)
Glucose, Bld: 124 mg/dL — ABNORMAL HIGH (ref 70–99)
Potassium: 3.4 mmol/L — ABNORMAL LOW (ref 3.5–5.1)
Sodium: 135 mmol/L (ref 135–145)
Total Bilirubin: 0.5 mg/dL (ref 0.3–1.2)
Total Protein: 6.3 g/dL — ABNORMAL LOW (ref 6.5–8.1)

## 2021-01-05 LAB — D-DIMER, QUANTITATIVE: D-Dimer, Quant: 6.65 ug/mL-FEU — ABNORMAL HIGH (ref 0.00–0.50)

## 2021-01-05 LAB — MAGNESIUM: Magnesium: 2 mg/dL (ref 1.7–2.4)

## 2021-01-05 LAB — FERRITIN: Ferritin: 694 ng/mL — ABNORMAL HIGH (ref 24–336)

## 2021-01-05 LAB — PHOSPHORUS: Phosphorus: 3 mg/dL (ref 2.5–4.6)

## 2021-01-05 LAB — C-REACTIVE PROTEIN: CRP: 16.6 mg/dL — ABNORMAL HIGH (ref <1.0)

## 2021-01-05 MED ORDER — POTASSIUM CHLORIDE CRYS ER 20 MEQ PO TBCR
40.0000 meq | EXTENDED_RELEASE_TABLET | Freq: Once | ORAL | Status: AC
  Administered 2021-01-05: 40 meq via ORAL
  Filled 2021-01-05: qty 2

## 2021-01-05 NOTE — Progress Notes (Signed)
PROGRESS NOTE                                                                                                                                                                                                             Patient Demographics:    Joseph Fitzgerald, is a 50 y.o. male, DOB - 19-Mar-1971, JQB:[id number]  Outpatient Primary MD for the patient is Ladell Pier, MD   Admit date - 12/30/2020   LOS - 5  No chief complaint on file.      Brief Narrative: Patient is a 50 y.o. male who has been followed in the pulmonology clinic for a left lower lung mass-presented to the ED on 2/3 with left-sided chest pain-found to have increasing left lower lobe mass and a loculated pleural effusion.  Evaluated by PCCM-chest tube placed-and subsequently admitted to the hospitalist service.  See below for further details.  COVID-19 vaccinated status: Partially vaccinated  Significant Events: 2/4>> Admit to [facility] for loculated pleural effusion/increasing left lower lobe mass.  Incidental COVID  Significant studies: 2/3>> CTA chest: No PE, complex/loculated pleural effusion-increased left lower lobe mass 2/6>> bilateral lower extremity Doppler: No DVT 2/7>> CT chest without contrast: Diffuse volume and loculated pleural effusion, persistent consolidation with necrosis in the left lower lobe.  Mediastinal lymphadenopathy.  COVID-19 medications: Remdesivir: 2/3>> 2/5  Antibiotics: Levofloxacin: 2/3>> 2/4 Rocephin: 2/4>> Flagyl: 2/4>>  Microbiology data: 2/3 >>blood culture: No growth 2/3>> Gold Quant front: Pending 2/3>> pleural fluid AFB smear: Negative 2/3>> pleural fluid culture: Negative 2/3>> pleural fluid fungal culture/AFB culture: Pending  Procedures: 2/3>> chest tube placement by PCCM  Consults: PCCM  DVT prophylaxis: enoxaparin (LOVENOX) injection 40 mg Start: 12/31/20 0845    Subjective:   No major issues  overnight-lying comfortably in bed   Assessment  & Plan :   Left cavitary mass with complicated/loculated pleural effusion-likely empyema: Chest tube in place-antibiotics as above-culture data as above-has undergone several rounds of pleural fibrinolytic administration-awaiting further recommendations from PCCM.   Poor dentition  COVID-19 infection: Incidental-asymptomatic-Remdesivir x3 days completed on 2/5-apart from watchful observation no further treatment required at this time.  Needs isolation for a total of 10 days from 2/2.  COVID-19 Labs: Recent Labs    01/03/21 0404 01/04/21 0345 01/05/21 0052  DDIMER 5.93* 5.36* 6.65*  FERRITIN 640* 642* 694*  CRP 26.5*  22.2* 16.6*    No results found for: BNP  Recent Labs  Lab 12/31/20 0906  PROCALCITON 1.25    Lab Results  Component Value Date   SARSCOV2NAA POSITIVE (A) 12/30/2020   [place]arker Not Detected 10/01/2020   [place] NEGATIVE 09/08/2019     Obesity: Estimated body mass index is 33.15 kg/m as calculated from the following:   Height as of this encounter: 5' (1.524 m).   Weight as of this encounter: 77 kg.     ABG:    Component Value Date/Time   TCO2 21 (L) 03/17/2018 0342    Vent Settings: N/A    Condition - Stable  Family Communication  :    Code Status :  Full Code  Diet :  Diet Order            Diet regular Room service appropriate? Yes; Fluid consistency: Thin  Diet effective now                  Disposition Plan  :   Status is: Inpatient  Remains inpatient appropriate because:Inpatient level of care appropriate due to severity of illness   Dispo: The patient is from: Home              Anticipated d/c is to: Home              Anticipated d/c date is: > 3 days              Patient currently is not medically stable to d/c.   Difficult to place patient No   Barriers to discharge: Chest tube in place-on IV antibiotics-May require bronchoscopy/CT surgery referral.    Antimicorbials  :    Anti-infectives (From admission, onward)   Start     Dose/Rate Route Frequency Ordered Stop   01/01/21 1145  cefTRIAXone (ROCEPHIN) 2 g in sodium chloride 0.9 % 100 mL IVPB        2 g 200 mL/hr over 30 Minutes Intravenous Every 24 hours 01/01/21 1050     01/01/21 1145  metroNIDAZOLE (FLAGYL) tablet 500 mg        500 mg Oral Every 8 hours 01/01/21 1050     01/01/21 1000  remdesivir 100 mg in sodium chloride 0.9 % 100 mL IVPB       "Followed by" Linked Group Details   100 mg 200 mL/hr over 30 Minutes Intravenous Daily 12/31/20 0841 01/02/21 1040   12/31/20 1000  levofloxacin (LEVAQUIN) tablet 750 mg  Status:  Discontinued        750 mg Oral Daily 12/31/20 0841 01/01/21 1050   12/31/20 1000  remdesivir 200 mg in sodium chloride 0.9% 250 mL IVPB       "Followed by" Linked Group Details   200 mg 580 mL/hr over 30 Minutes Intravenous Once 12/31/20 0841 12/31/20 1256   12/31/20 0630  levofloxacin (LEVAQUIN) IVPB 500 mg  Status:  Discontinued        500 mg 100 mL/hr over 60 Minutes Intravenous  Once 12/31/20 5361 12/31/20 0841      Inpatient Medications  Scheduled Meds: . alteplase (TPA) for intrapleural administration  10 mg Intrapleural Daily   And  . pulmozyme (DORNASE) for intrapleural administration  5 mg Intrapleural Daily  . vitamin C  500 mg Oral Daily  . atorvastatin  10 mg Oral Daily  . enoxaparin (LOVENOX) injection  40 mg Subcutaneous Q24H  . fluticasone  2 spray Each Nare Daily  . fluticasone furoate-vilanterol  1 puff Inhalation Daily  . metroNIDAZOLE  500 mg Oral Q8H  . sodium chloride flush  10 mL Intracatheter Q8H  . zinc sulfate  220 mg Oral Daily   Continuous Infusions: . sodium chloride 10 mL/hr at 01/01/21 1108  . cefTRIAXone (ROCEPHIN)  IV 2 g (01/04/21 1221)   PRN Meds:.acetaminophen, albuterol, benzonatate, ibuprofen, morphine injection, ondansetron (ZOFRAN) IV, oxyCODONE   Time Spent in minutes  15  See all Orders from today  for further details   Oren Binet M.D on 01/05/2021 at 11:44 AM  To page go to [email or web address] - use universal password  [facility] -  Office  [phone]    Objective:   Vitals:   01/04/21 0424 01/04/21 1452 01/04/21 2042 01/05/21 0510  BP: 101/70 110/71 115/72 104/64  Pulse: 92 78 86 79  Resp: 16 18 18 20   Temp: 98.7 F (37.1 C) 99 F (37.2 C) (!) 100.9 F (38.3 C) 98.6 F (37 C)  TempSrc: Oral Oral Oral Oral  SpO2: 95%  97% 95%  Weight:      Height:        Wt Readings from Last 3 Encounters:  01/02/21 77 kg  12/07/20 76.5 kg  11/13/20 76.7 kg     Intake/Output Summary (Last 24 hours) at 01/05/2021 1144 Last data filed at 01/05/2021 1103 Gross per 24 hour  Intake 500 ml  Output 565 ml  Net -65 ml     Physical Exam Gen Exam:Alert awake-not in any distress HEENT:atraumatic, normocephalic Chest: B/L clear to auscultation anteriorly CVS:S1S2 regular Abdomen:soft non tender, non distended Extremities:no edema Neurology: Non focal Skin: no rash   Data Review:    CBC Recent Labs  Lab 01/01/21 0025 01/02/21 0401 01/03/21 0404 01/04/21 0345 01/05/21 0052  WBC 16.6* 20.0* 15.8* 10.1 10.5  HGB 13.3 11.8* 12.3* 12.4* 12.6*  HCT 41.0 36.3* 34.6* 36.5* 36.0*  PLT 383 319 324 340 367  MCV 89.7 89.0 87.6 89.2 87.0  MCH 29.1 28.9 31.1 30.3 30.4  MCHC 32.4 32.5 35.5 34.0 35.0  RDW 12.7 12.9 13.0 13.2 13.3  LYMPHSABS 1.1 1.3 1.1 1.3 1.0  MONOABS 1.7* 1.7* 1.5* 1.1* 1.3*  EOSABS 0.1 0.0 0.1 0.1 0.1  BASOSABS 0.1 0.1 0.0 0.0 0.0    Chemistries  Recent Labs  Lab 01/01/21 0025 01/02/21 0401 01/03/21 0404 01/04/21 0345 01/05/21 0052  NA 136 134* 135 136 135  K 3.8 3.6 3.4* 3.3* 3.4*  CL 101 101 100 100 100  CO2 25 24 26 27 26   GLUCOSE 111* 116* 114* 107* 124*  BUN 9 9 10 8 10   CREATININE 1.00 0.88 0.75 0.84 0.72  CALCIUM 8.4* 8.0* 7.8* 7.7* 7.8*  MG 1.8 2.0 1.9 2.2 2.0  AST 19 15 18 20 22   ALT 24 17 16 16 16   ALKPHOS 87 64 68 59 56   BILITOT 1.3* 0.7 0.5 0.7 0.5   ------------------------------------------------------------------------------------------------------------------ No results for input(s): CHOL, HDL, LDLCALC, TRIG, CHOLHDL, LDLDIRECT in the last 72 hours.  Lab Results  Component Value Date   HGBA1C 5.4 10/17/2019   ------------------------------------------------------------------------------------------------------------------ No results for input(s): TSH, T4TOTAL, T3FREE, THYROIDAB in the last 72 hours.  Invalid input(s): FREET3 ------------------------------------------------------------------------------------------------------------------ Recent Labs    01/04/21 0345 01/05/21 0052  FERRITIN 642* 694*    Coagulation profile No results for input(s): INR, PROTIME in the last 168 hours.  Recent Labs    01/04/21 0345 01/05/21 0052  DDIMER 5.36* 6.65*    Cardiac Enzymes No results for input(s):  CKMB, TROPONINI, MYOGLOBIN in the last 168 hours.  Invalid input(s): CK ------------------------------------------------------------------------------------------------------------------ No results found for: BNP  Micro Results Recent Results (from the past 240 hour(s))  SARS Coronavirus 2 by RT PCR (hospital order, performed in [facility] hospital lab) Nasopharyngeal Nasopharyngeal Swab     Status: Abnormal   Collection Time: 12/30/20 10:50 PM   Specimen: Nasopharyngeal Swab  Result Value Ref Range Status   SARS Coronavirus 2 POSITIVE (A) NEGATIVE Final    Comment: RESULT CALLED TO, READ BACK BY AND VERIFIED WITH: C CRISCO RN 12/31/20 0044 JDW (NOTE) SARS-CoV-2 target nucleic acids are DETECTED  SARS-CoV-2 RNA is generally detectable in upper respiratory specimens  during the acute phase of infection.  Positive results are indicative  of the presence of the identified virus, but do not rule out bacterial infection or co-infection with other pathogens not detected by the test.  Clinical  correlation with patient history and  other diagnostic information is necessary to determine patient infection status.  The expected result is negative.  Fact Sheet for Patients:   StrictlyIdeas.no   Fact Sheet for Healthcare Providers:   BankingDealers.co.za    This test is not yet approved or cleared by the [place] FDA and  has been authorized for detection and/or diagnosis of SARS-CoV-2 by FDA under an Emergency Use Authorization (EUA).  This EUA will remain in effect (meaning this test can  be used) for the duration of  the COVID-19 declaration under Section 564(b)(1) of the Act, 21 U.S.C. section 360-bbb-3(b)(1), unless the authorization is terminated or revoked sooner.  Performed at [facility], [place] [address] [place], [place] [address]   Culture, blood (routine x 2) Call MD if unable to obtain prior to antibiotics being given     Status: None   Collection Time: 12/31/20  8:42 AM   Specimen: BLOOD  Result Value Ref Range Status   Specimen Description BLOOD LEFT ANTECUBITAL  Final   Special Requests   Final    BOTTLES DRAWN AEROBIC AND ANAEROBIC Blood Culture results may not be optimal due to an inadequate volume of blood received in culture bottles   Culture   Final    NO GROWTH 5 DAYS Performed at [place] [address], [place], [place] [address]    Report Status 01/05/2021 FINAL  Final  Culture, blood (routine x 2) Call MD if unable to obtain prior to antibiotics being given     Status: None   Collection Time: 12/31/20  9:14 AM   Specimen: BLOOD  Result Value Ref Range Status   Specimen Description BLOOD RIGHT ANTECUBITAL  Final   Special Requests   Final    BOTTLES DRAWN AEROBIC AND ANAEROBIC Blood Culture adequate volume   Culture   Final    NO GROWTH 5 DAYS Performed at [facility], [place] [address], [place], [place] [address]    Report Status 01/05/2021 FINAL  Final  Body fluid  culture (includes gram stain)     Status: None   Collection Time: 12/31/20 11:18 AM   Specimen: Pleural Fluid  Result Value Ref Range Status   Specimen Description PLEURAL FLUID  Final   Special Requests NONE  Final   Gram Stain   Final    ABUNDANT WBC PRESENT,BOTH PMN AND MONONUCLEAR NO ORGANISMS SEEN    Culture   Final    NO GROWTH Performed at [facility], [place] [address] [place], [place] [address]  Report Status 01/03/2021 FINAL  Final  Acid Fast Smear (AFB)     Status: None   Collection Time: 12/31/20 11:18 AM   Specimen: Pleural; Respiratory  Result Value Ref Range Status   AFB Specimen Processing Concentration  Final   Acid Fast Smear Negative  Final    Comment: (NOTE) Performed At: [facility] [place], [place] [id number] Rush Farmer MD BM:[id number]    Source (AFB) PLEURAL  Final    Comment: FLUID Performed at [facility], [place] [address] [place], [place] [address]     Radiology Reports DG Chest 2 View  Result Date: 12/29/2020 CLINICAL DATA:  Cough. Increasing left lower rib pain. Mass 2 left lower lung. EXAM: CHEST - 2 VIEW COMPARISON:  Radiograph December 13 21.  CT chest 12/01/2020. FINDINGS: Persistent masslike opacity in the left lower lobe, which has increased since 11/09/2020 radiograph. Direct comparison with the 12/01/2020 CT chest is difficult given differences in modality. New small left pleural effusion. No visible pneumothorax. Cardiomediastinal silhouette is within normal limits. No evidence of acute osseous abnormality. IMPRESSION: Persistent masslike opacity in the left lower lobe with new small left pleural effusion. The opacity is increased relative to prior radiograph from 11/09/2020. Comparison with recent CT chest is limited given differences in modality. Differential considerations remain pneumonia versus malignancy and recommend follow-up chest CT as directed from the 12/01/2020 CT chest. Electronically Signed    By: Margaretha Sheffield MD   On: 12/29/2020 13:22   CT CHEST WO CONTRAST  Result Date: 01/04/2021 CLINICAL DATA:  50 year old with loculated left pleural effusion and chest tube. Patient has undergone pleural fibrinolytic therapy. EXAM: CT CHEST WITHOUT CONTRAST TECHNIQUE: Multidetector CT imaging of the chest was performed following the standard protocol without IV contrast. COMPARISON:  Chest CT 12/31/2020 and chest radiograph 01/03/2021 and chest CT 12/01/2020 FINDINGS: Cardiovascular: Normal caliber of the thoracic aorta without atherosclerotic calcifications. Small pericardial effusion which appears to be slightly increased. Heart size is normal. Mediastinum/Nodes: Prominent subcarinal tissue measuring up to 1.8 cm in short axis on sequence 3, image 27 and similar to the recent comparison examination. However, subcarinal tissue only measured 1.1 cm in the short axis on 12/01/2020. Prominent right paratracheal lymph node measuring 0.9 cm in the short axis on sequence 3, image 20. Again noted are multiple small left axillary lymph nodes. Lungs/Pleura: Left posterior pigtail drain in the pleural space. The drain was placed along the medial aspect of the left lower chest. Overall, the volume of pleural fluid has decreased, particularly in the left lower chest. There is a small amount of residual pleural thickening or pleural fluid in the left lower chest. Residual loculated pleural fluid in the lingular region. Multiple pockets of pleural air. Residual small pockets of loculated pleural fluid near the superior segment of the left lower lobe. Persistent area of consolidation in the left lower lobe with evidence of central necrosis on sequence 6, image 81. This area roughly measures 4.8 x 4.9 cm. This area roughly measured 5.5 x 3.5 cm on 12/01/2020. Patchy densities throughout the left lower lung compatible with areas of consolidation and volume loss. Right lung is clear. Negative for a right pleural effusion.  Upper Abdomen: Images of the upper abdomen are unremarkable. Musculoskeletal: No acute bone abnormality. IMPRESSION: 1. Loculated left pleural effusion has decreased in volume. There continues to be scattered areas of loculated left pleural fluid with pockets of pleural air. Chest tube is positioned in the medial posterior left  chest. 2. Persistent consolidation with necrosis in the left lower lobe. This area has persisted since 12/01/2020. Findings are concerning for necrotic pneumonia but an underlying malignancy can not be excluded. 3. Mediastinal lymphadenopathy. This lymphadenopathy has not significantly changed since the recent comparison examination but has increased since 12/01/2020. This lymphadenopathy could be reactive but indeterminate. 4. Small but slightly increased pericardial effusion. Electronically Signed   By: Markus Daft M.D.   On: 01/04/2021 08:30   CT ANGIO CHEST PE W OR WO CONTRAST  Result Date: 12/31/2020 CLINICAL DATA:  Shortness of breath EXAM: CT ANGIOGRAPHY CHEST WITH CONTRAST TECHNIQUE: Multidetector CT imaging of the chest was performed using the standard protocol during bolus administration of intravenous contrast. Multiplanar CT image reconstructions and MIPs were obtained to evaluate the vascular anatomy. CONTRAST:  169m OMNIPAQUE IOHEXOL 350 MG/ML SOLN COMPARISON:  CT 12/01/2020, 10/14/2020 FINDINGS: Cardiovascular: Satisfactory opacification the pulmonary arteries to the segmental level. No pulmonary artery filling defects are identified. Central pulmonary arteries are top-normal caliber. Cardiac size is top normal. No sizable pericardial effusion. There is however some inflammation in the adjacent paracardial/mediastinal fat which is likely secondary to the it extensive process in the left lung. The aorta is normal caliber. No acute luminal abnormality of the imaged aorta. No periaortic stranding or hemorrhage. Normal 3 vessel branching of the aortic arch. Proximal great  vessels are unremarkable. No major venous abnormalities are seen. Mediastinum/Nodes: Stranding and thickening in the left anterior mediastinal/paracardial fat. No mediastinal free fluid or air. Normal thyroid gland and thoracic inlet. No acute abnormality of the trachea or esophagus. Scattered low-attenuation subcentimeter mediastinal and hilar adenopathy. Several larger subcarinal and left hilar nodes are present measuring up to, 1.5 cm and 1.2 cm respectively (7/189), quite similar to prior. There is some increasing left axillary adenopathy is present as well with some mild surrounding stranding which could reflect a reactive process. Lungs/Pleura: Increasing consolidative opacity centered in the left lower with hypoattenuation of the consolidated parenchyma suggestive of some underlying infection with scattered air lucencies, possibly developing cavitation (6/77). Development of a complex, loculated pleural effusion extending over the lung apex, into the fissures, and throughout the lung base. Suspect some reactive stranding of the adjacent mediastinal fat as detailed above. Several areas suggest some associated pleural thickening which could reflect a developing empyema. Right lung remains essentially clear without visible right pleural effusion. No pneumothorax. Upper Abdomen: No acute abnormalities present in the visualized portions of the upper abdomen. Musculoskeletal: No acute osseous abnormality or suspicious osseous lesion. Multilevel degenerative changes are present in the imaged portions of the spine. No clear involvement of the chest wall. No erosive or destructive changes of the adjacent ribs. No worrisome chest wall masses or lesions. Review of the MIP images confirms the above findings. IMPRESSION: 1. No pulmonary embolism is seen. 2. Increasing consolidative opacity centered in the left lower with hypoattenuation of the consolidated parenchyma suggestive of some underlying infection with scattered  air lucencies, possibly developing cavitation or necrosis. Additional development of a complex, loculated pleural effusion extending over the lung apex, into the fissure, and throughout the lung base. Several areas suggest some associated pleural thickening/empyema. Continued follow-up to resolution is recommended as an underlying mass/malignancy is difficult to fully exclude. 3. Some increasing scattered subcentimeter hypoattenuating mediastinal, hilar and left axillary adenopathy, may be reactive though continued attention on follow-up imaging is warranted pending exclusion of malignancy. These results were called by telephone at the time of interpretation on 12/31/2020 at 6:16 am  to provider [facility] , who verbally acknowledged these results. Electronically Signed   By: Lovena Le M.D.   On: 12/31/2020 06:17   DG Chest Port 1 View  Result Date: 01/05/2021 CLINICAL DATA:  Chest tube placement for empyema EXAM: PORTABLE CHEST 1 VIEW COMPARISON:  Chest radiograph January 03, 2021 and chest CT January 04, 2021 FINDINGS: Pigtail catheter present on the left inferomedially, stable. Ill-defined areas of airspace opacity in the left lower lung region appear essentially stable. There are areas of questionable cavitation in this area, better seen on recent CT. There is ill-defined opacity in the left upper and mid lung regions, slightly less than on recent studies. Right lung is clear. Heart size and pulmonary vascularity are normal. No adenopathy. No bone lesions. IMPRESSION: Stable catheter position inferomedially on the left. No pneumothorax. Airspace consolidation with areas of questionable cavitation in this region, better delineated on recent CT. Slightly less opacity left upper and mid lung regions. No new opacities evident. Right lung clear. Stable cardiac silhouette. Electronically Signed   By: Lowella Grip III M.D.   On: 01/05/2021 09:57   DG CHEST PORT 1 VIEW  Result Date: 01/03/2021 CLINICAL  DATA:  Chest pain. EXAM: PORTABLE CHEST 1 VIEW COMPARISON:  January 02, 2021. FINDINGS: Stable cardiomegaly. Right lung is clear. Stable position of left-sided chest tube is noted with grossly stable left pleural effusion and associated atelectasis or infiltrate. Bony thorax is unremarkable. No pneumothorax is noted. IMPRESSION: Stable position of left-sided chest tube with grossly stable left pleural effusion and associated atelectasis or infiltrate. Electronically Signed   By: Marijo Conception M.D.   On: 01/03/2021 08:38   DG Chest Port 1 View  Result Date: 01/02/2021 CLINICAL DATA:  Shortness of breath, COVID-19 positive. EXAM: PORTABLE CHEST 1 VIEW COMPARISON:  January 01, 2021. FINDINGS: Stable cardiomegaly. No pneumothorax is noted. Stable position of left-sided chest tube. Right lung is clear. Loculated left pleural effusion noted on prior exam is significantly smaller currently. Stable left basilar atelectasis or infiltrate is noted. Bony thorax is unremarkable. IMPRESSION: Stable position of left-sided chest tube. Loculated left pleural effusion noted on prior exam is significantly smaller currently. Stable left basilar atelectasis or infiltrate is noted. Electronically Signed   By: Marijo Conception M.D.   On: 01/02/2021 09:53   DG CHEST PORT 1 VIEW  Result Date: 01/01/2021 CLINICAL DATA:  Pleural effusion, chest tube, COVID pneumonia EXAM: PORTABLE CHEST 1 VIEW COMPARISON:  12/31/2020 FINDINGS: Large, partially laterally loculated left pleural effusion has enlarged in size. Left basilar pigtail chest tube positioned inferomedially is unchanged. Interval development of mild mediastinal shift to the right. Right lung is clear. No pneumothorax. No pleural effusion on the right. Cardiac size within normal limits. IMPRESSION: Left chest tube unchanged. Progressive enlargement of a partially loculated left pleural effusion with developing mediastinal shift to the right. CT examination may be helpful to  assess catheter position in relation to the developing pleural fluid. Electronically Signed   By: Fidela Salisbury MD   On: 01/01/2021 08:15   DG CHEST PORT 1 VIEW  Result Date: 12/31/2020 CLINICAL DATA:  Loculated pleural effusion. EXAM: PORTABLE CHEST 1 VIEW COMPARISON:  CT 12/31/2020.  Chest x-ray 12/29/2020. FINDINGS: Left chest tube noted over the left lower chest. Prominent left pleural effusion with possible loculation. Underlying left base atelectasis/infiltrate most likely present. No pneumothorax. Heart size stable. IMPRESSION: Left chest tube noted over the left lower chest. Prominent left pleural effusion with possible loculation.  Underlying left base atelectasis/infiltrate most likely present. No pneumothorax. Electronically Signed   By: Marcello Moores  Register   On: 12/31/2020 11:43   VAS [place] LOWER EXTREMITY VENOUS (DVT)  Result Date: 01/03/2021  Lower Venous DVT Study Indications: Covid, elevated d-dimer.  Anticoagulation: Lovenox. Comparison Study: No prior studies. Performing Technologist: Darlin Coco RDMS  Examination Guidelines: A complete evaluation includes B-mode imaging, spectral Doppler, color Doppler, and power Doppler as needed of all accessible portions of each vessel. Bilateral testing is considered an integral part of a complete examination. Limited examinations for reoccurring indications may be performed as noted. The reflux portion of the exam is performed with the patient in reverse Trendelenburg.  +---------+---------------+---------+-----------+----------+--------------+ RIGHT    CompressibilityPhasicitySpontaneityPropertiesThrombus Aging +---------+---------------+---------+-----------+----------+--------------+ CFV      Full           Yes      Yes                                 +---------+---------------+---------+-----------+----------+--------------+ SFJ      Full                                                         +---------+---------------+---------+-----------+----------+--------------+ FV Prox  Full                                                        +---------+---------------+---------+-----------+----------+--------------+ FV Mid   Full                                                        +---------+---------------+---------+-----------+----------+--------------+ FV DistalFull                                                        +---------+---------------+---------+-----------+----------+--------------+ PFV      Full                                                        +---------+---------------+---------+-----------+----------+--------------+ POP      Full           Yes      Yes                                 +---------+---------------+---------+-----------+----------+--------------+ PTV      Full                                                        +---------+---------------+---------+-----------+----------+--------------+  PERO     Full                                                        +---------+---------------+---------+-----------+----------+--------------+   +---------+---------------+---------+-----------+----------+--------------+ LEFT     CompressibilityPhasicitySpontaneityPropertiesThrombus Aging +---------+---------------+---------+-----------+----------+--------------+ CFV      Full           Yes      Yes                                 +---------+---------------+---------+-----------+----------+--------------+ SFJ      Full                                                        +---------+---------------+---------+-----------+----------+--------------+ FV Prox  Full                                                        +---------+---------------+---------+-----------+----------+--------------+ FV Mid   Full                                                         +---------+---------------+---------+-----------+----------+--------------+ FV DistalFull                                                        +---------+---------------+---------+-----------+----------+--------------+ PFV      Full                                                        +---------+---------------+---------+-----------+----------+--------------+ POP      Full           Yes      Yes                                 +---------+---------------+---------+-----------+----------+--------------+ PTV      Full                                                        +---------+---------------+---------+-----------+----------+--------------+ PERO     Full                                                        +---------+---------------+---------+-----------+----------+--------------+  Summary: RIGHT: - There is no evidence of deep vein thrombosis in the lower extremity.  - No cystic structure found in the popliteal fossa.  LEFT: - There is no evidence of deep vein thrombosis in the lower extremity.  - No cystic structure found in the popliteal fossa.  *See table(s) above for measurements and observations. Electronically signed by Monica Martinez MD on 01/03/2021 at 2:28:49 PM.    Final

## 2021-01-05 NOTE — Progress Notes (Signed)
NAME:  Joseph Fitzgerald, MRN:  [id number], DOB:  1971/07/08, LOS: 5 ADMISSION DATE:  12/30/2020, CONSULTATION DATE:  2/3 REFERRING MD:  Lorin Mercy, CHIEF COMPLAINT:  Chest pain   Brief History:  50 y/o male who has been followed in the pulmonology clinic for a left lower lobe mass felt to be of infectious etiology presented to the [facility] emergency department on January 01, 2020 complaining of left chest pain.  Noted to have an increased size of the mass with an associated pleural effusion.  History of Present Illness:  This is a 50 year old male with minimal past medical history who was initially seen in the pulmonology clinic in late 2021 after he developed left-sided chest pain and was seen in urgent care.  He had a chest x-ray which showed a mass with likely cavitation.  He was treated with oral antibiotics for over a month and in January 2022 he was noted to have decreasing size of the mass and symptoms had improved.  Of note, the patient has very poor dentition, previously had no dental insurance and had pulled most of his teeth by himself when he had dental problems.  However on December 29, 2020 he presented to urgent care with a complaint of left-sided chest pain again.  He was treated with antibiotics, chest x-ray showed increasing size of the mass and he was referred back to pulmonology.  Plans are being made for bronchoscopic evaluation.  He returned to the [facility] emergency department on December 31, 2020 complaining of left-sided chest pain, worsening dyspnea, noted to be febrile.  The dyspnea and chest pain started on Sunday January 30.  A CT angiogram chest was performed which showed increasing size of the necrotic mass and a moderate size loculated pleural effusion.  Cigarette smoking> noted in his history but he denies when I ask.  He was found to be COVID positive.  He states that he had his first vaccine on 1/15, doesn't know the brand.  He has had symptoms of headache and mild body aches.     Past Medical History:  Smoker? Poor dentition Mass of lower lobe of left lung Marijuana abuse Asthma Aortic atherosclerosis  Significant Hospital Events:  2/3 admit 2/7 Repeat CT with persistent   Consults:  PCCM  Procedures:    Significant Diagnostic Tests:  2/3 CT Angiogram chest > right lung without evidence of pulmonary parenchymal abnormality, left lung with large left lower lobe mass, and cavitation, partially loculated anterior fissure effusion in 2 separate locations: 1 in the left base and a separate in the superior thorax, bulky adenopathy noted in the subcarinal, left pretracheal, and left hilar areas.   2/7 CT chest>> 1. Loculated left pleural effusion has decreased in volume. There continues to be scattered areas of loculated left pleural fluid with pockets of pleural air. Chest tube is positioned in the medial posterior left chest. 2. Persistent consolidation with necrosis in the left lower lobe. This area has persisted since 12/01/2020. Findings are concerning for necrotic pneumonia but an underlying malignancy can not be excluded. 3. Mediastinal lymphadenopathy. This lymphadenopathy has not significantly changed since the recent comparison examination but has increased since 12/01/2020. This lymphadenopathy could be reactive but indeterminate. 4. Small but slightly increased pericardial effusion.  Micro Data:  2/3 sars cov 2 > positive 2/3 blood >no growth  Antimicrobials:  2/3 levaquin > 2/4 2/4 ceftriaxone> 2/4 Flagyl> 2/3 remdesivir >  Interim History / Subjective:   Stable today, continued output from chest tube,  700cc in [place] drain  Objective   Blood pressure 104/64, pulse 79, temperature 98.6 F (37 C), temperature source Oral, resp. rate 20, height 5' (1.524 m), weight 77 kg, SpO2 95 %.        Intake/Output Summary (Last 24 hours) at 01/05/2021 1136 Last data filed at 01/05/2021 1103 Gross per 24 hour  Intake 500 ml  Output 565 ml   Net -65 ml   Filed Weights   12/31/20 1530 01/02/21 0402  Weight: 76.5 kg 77 kg    General:  Well nourished M, no acute distress HEENT: MM pink/moist Neuro: awake, alert, oriented x3 CV: s1s2 rrr, no m/r/g PULM:  L chest tube in place, blood-tinged serosanguinous drainage, decreased air entry in LLL but otherwise good air movement GI: soft, bsx4 active  Extremities: warm/dry, no  edema  Skin: no rashes or lesions    Resolved Hospital Problem list     Assessment & Plan:   Cavitary pneumonia with associate pleural effusion in a male with poor dentition: Favor infectious etiology, now with likely empyema though malignancy is possible.  Pleural fluid studies consistent with inflammatory process. Stable clinical course, pt reports continued  Plan: -CT surgery reviewed case and felt that no surgical intervention indicated per Dr. Servando Snare -Plan to continue 7 days of TPA/Dornase ending 2/11, monitor tube output -repeat chest CT after lytic course -continue ceftriaxone and Flagyl -PCCM will continue to follow with you    Poor dentition Plan: Outpatient dental referral    COVID-19 infection Incidental, remdesivir x3 days. -on room air, recovering  Best practice (evaluated daily)   Per [facility]  Goals of Care:   Per [facility]  Labs   CBC: Recent Labs  Lab 01/01/21 0025 01/02/21 0401 01/03/21 0404 01/04/21 0345 01/05/21 0052  WBC 16.6* 20.0* 15.8* 10.1 10.5  NEUTROABS 13.7* 16.7* 13.1* 7.5 8.1*  HGB 13.3 11.8* 12.3* 12.4* 12.6*  HCT 41.0 36.3* 34.6* 36.5* 36.0*  MCV 89.7 89.0 87.6 89.2 87.0  PLT 383 319 324 340 433    Basic Metabolic Panel: Recent Labs  Lab 01/01/21 0025 01/02/21 0401 01/03/21 0404 01/04/21 0345 01/05/21 0052  NA 136 134* 135 136 135  K 3.8 3.6 3.4* 3.3* 3.4*  CL 101 101 100 100 100  CO2 25 24 26 27 26   GLUCOSE 111* 116* 114* 107* 124*  BUN 9 9 10 8 10   CREATININE 1.00 0.88 0.75 0.84 0.72  CALCIUM 8.4* 8.0* 7.8* 7.7* 7.8*  MG 1.8 2.0  1.9 2.2 2.0  PHOS 1.9* 2.8 2.6 3.2 3.0   GFR: Estimated Creatinine Clearance: 98.2 mL/min (by C-G formula based on SCr of 0.72 mg/dL). Recent Labs  Lab 12/30/20 2253 12/30/20 2254 12/31/20 0549 12/31/20 0906 01/01/21 0025 01/02/21 0401 01/03/21 0404 01/04/21 0345 01/05/21 0052  PROCALCITON  --   --   --  1.25  --   --   --   --   --   WBC  --    < >  --   --    < > 20.0* 15.8* 10.1 10.5  LATICACIDVEN 1.1  --  1.3  --   --   --   --   --   --    < > = values in this interval not displayed.    Liver Function Tests: Recent Labs  Lab 01/01/21 0025 01/02/21 0401 01/03/21 0404 01/04/21 0345 01/05/21 0052  AST 19 15 18 20 22   ALT 24 17 16 16 16   ALKPHOS 87 64  68 59 56  BILITOT 1.3* 0.7 0.5 0.7 0.5  PROT 7.4 5.9* 6.0* 6.1* 6.3*  ALBUMIN 2.3* 1.8* 1.8* 1.8* 1.8*   No results for input(s): LIPASE, AMYLASE in the last 168 hours. No results for input(s): AMMONIA in the last 168 hours.  ABG    Component Value Date/Time   TCO2 21 (L) 03/17/2018 0342     Coagulation Profile: No results for input(s): INR, PROTIME in the last 168 hours.  Cardiac Enzymes: No results for input(s): CKTOTAL, CKMB, CKMBINDEX, TROPONINI in the last 168 hours.  HbA1C: HbA1c POC (<> result, manual entry)  Date/Time Value Ref Range Status  10/17/2019 03:00 PM 5.4 4.0 - 5.6 % Final   Hgb A1c MFr Bld  Date/Time Value Ref Range Status  03/17/2018 03:34 AM 5.1 4.8 - 5.6 % Final    Comment:    (NOTE) Pre diabetes:          5.7%-6.4% Diabetes:              >6.4% Glycemic control for   <7.0% adults with diabetes   08/14/2016 08:09 AM 5.4 4.8 - 5.6 % Final    Comment:    (NOTE)         Pre-diabetes: 5.7 - 6.4         Diabetes: >6.4         Glycemic control for adults with diabetes: <7.0     CBG: No results for input(s): GLUCAP in the last 168 hours.   Critical care time 32 minutes   Otilio Carpen Gleason, PA-C [facility] See Amion for pager If no response to pager  , please call [phone] until 7pm After 7:00 pm call Elink  [phone]?[place]

## 2021-01-05 NOTE — Procedures (Signed)
Pleural Fibrinolytic Administration Procedure Note  Joseph Fitzgerald  [id number]  Sep 05, 1971  Date:01/05/21  Time:11:36 AM   Provider Performing:Joseph Fitzgerald   Procedure: Pleural Fibrinolysis Subsequent day (98421)  Indication(s) Fibrinolysis of complicated pleural effusion  Consent Risks of the procedure as well as the alternatives and risks of each were explained to the patient and/or caregiver.  Consent for the procedure was obtained.   Anesthesia None   Time Out Verified patient identification, verified procedure, site/side was marked, verified correct patient position, special equipment/implants available, medications/allergies/relevant history reviewed, required imaging and test results available.   Sterile Technique Hand hygiene, gloves   Procedure Description Existing pleural catheter was cleaned and accessed in sterile manner.  77m of tPA in 30cc of saline and 58mof dornase in 30cc of sterile water were injected into pleural space using existing pleural catheter.  Catheter will be clamped for 1 hour and then placed back to suction.   Complications/Tolerance None; patient tolerated the procedure well.  EBL None   Specimen(s) None

## 2021-01-06 ENCOUNTER — Inpatient Hospital Stay (HOSPITAL_COMMUNITY): Payer: 59

## 2021-01-06 DIAGNOSIS — Z9689 Presence of other specified functional implants: Secondary | ICD-10-CM | POA: Diagnosis not present

## 2021-01-06 DIAGNOSIS — J90 Pleural effusion, not elsewhere classified: Secondary | ICD-10-CM | POA: Diagnosis not present

## 2021-01-06 NOTE — Plan of Care (Signed)
Patient resting in bed. VSS remains on room air. Chest tube in place. Flushed per order. OOB ambulating. No complaints overnight. Call bell within reach. All needs met at this time.   Problem: Education: Goal: Knowledge of risk factors and measures for prevention of condition will improve Outcome: Progressing   Problem: Coping: Goal: Psychosocial and spiritual needs will be supported Outcome: Progressing   Problem: Respiratory: Goal: Will maintain a patent airway Outcome: Progressing Goal: Complications related to the disease process, condition or treatment will be avoided or minimized Outcome: Progressing

## 2021-01-06 NOTE — Procedures (Signed)
Pleural Fibrinolytic Administration Procedure Note  Joseph Fitzgerald  [id number]  08-31-1971  Date:01/06/21  Time:12:05 PM   Provider Performing:Obi Scrima R Lachanda Buczek   Procedure: Pleural Fibrinolysis Subsequent day (00370)  Indication(s) Fibrinolysis of complicated pleural effusion  Consent Risks of the procedure as well as the alternatives and risks of each were explained to the patient and/or caregiver.  Consent for the procedure was obtained.   Anesthesia None   Time Out Verified patient identification, verified procedure, site/side was marked, verified correct patient position, special equipment/implants available, medications/allergies/relevant history reviewed, required imaging and test results available.   Sterile Technique Hand hygiene, gloves   Procedure Description Existing pleural catheter was cleaned and accessed in sterile manner.  83m of tPA in 30cc of saline and 564mof dornase in 30cc of sterile water were injected into pleural space using existing pleural catheter.  Catheter will be clamped for 1 hour and then placed back to suction.   Complications/Tolerance None; patient tolerated the procedure well.  EBL None   Specimen(s) None   LaOtilio Carpenleason, PA-C

## 2021-01-06 NOTE — Progress Notes (Signed)
NAME:  Kaya Klausing, MRN:  [id number], DOB:  13-Nov-1971, LOS: 6 ADMISSION DATE:  12/30/2020, CONSULTATION DATE:  2/3 REFERRING MD:  Lorin Mercy, CHIEF COMPLAINT:  Chest pain   Brief History:  50 y/o male who has been followed in the pulmonology clinic for a left lower lobe mass felt to be of infectious etiology presented to the [facility] emergency department on January 01, 2020 complaining of left chest pain.  Noted to have an increased size of the mass with an associated pleural effusion.  History of Present Illness:  This is a 50 year old male with minimal past medical history who was initially seen in the pulmonology clinic in late 2021 after he developed left-sided chest pain and was seen in urgent care.  He had a chest x-ray which showed a mass with likely cavitation.  He was treated with oral antibiotics for over a month and in January 2022 he was noted to have decreasing size of the mass and symptoms had improved.  Of note, the patient has very poor dentition, previously had no dental insurance and had pulled most of his teeth by himself when he had dental problems.  However on December 29, 2020 he presented to urgent care with a complaint of left-sided chest pain again.  He was treated with antibiotics, chest x-ray showed increasing size of the mass and he was referred back to pulmonology.  Plans are being made for bronchoscopic evaluation.  He returned to the [facility] emergency department on December 31, 2020 complaining of left-sided chest pain, worsening dyspnea, noted to be febrile.  The dyspnea and chest pain started on Sunday January 30.  A CT angiogram chest was performed which showed increasing size of the necrotic mass and a moderate size loculated pleural effusion.  Cigarette smoking> noted in his history but he denies when asked  He was found to be COVID positive.  He states that he had his first vaccine on 1/15, doesn't know the brand.  He has had symptoms of headache and mild body aches.     Past Medical History:  Smoker? Poor dentition Mass of lower lobe of left lung Marijuana abuse Asthma Aortic atherosclerosis  Significant Hospital Events:  2/3 admit 2/7 Repeat CT with persistent   Consults:  PCCM  Procedures:    Significant Diagnostic Tests:  2/3 CT Angiogram chest > right lung without evidence of pulmonary parenchymal abnormality, left lung with large left lower lobe mass, and cavitation, partially loculated anterior fissure effusion in 2 separate locations: 1 in the left base and a separate in the superior thorax, bulky adenopathy noted in the subcarinal, left pretracheal, and left hilar areas.   2/7 CT chest>> 1. Loculated left pleural effusion has decreased in volume. There continues to be scattered areas of loculated left pleural fluid with pockets of pleural air. Chest tube is positioned in the medial posterior left chest. 2. Persistent consolidation with necrosis in the left lower lobe. This area has persisted since 12/01/2020. Findings are concerning for necrotic pneumonia but an underlying malignancy can not be excluded. 3. Mediastinal lymphadenopathy. This lymphadenopathy has not significantly changed since the recent comparison examination but has increased since 12/01/2020. This lymphadenopathy could be reactive but indeterminate. 4. Small but slightly increased pericardial effusion.  Micro Data:  2/3 sars cov 2 > positive 2/3 blood >no growth 2/3 fungal culture>> 2/3 body fluid culture>>no growth 2/3 acid fast culture>>  Antimicrobials:  2/3 levaquin > 2/4 2/4 ceftriaxone> 2/4 Flagyl> 2/3 remdesivir >  Interim History /  Subjective:   Stable today, continued output from chest tube, 700cc in [place] drain  Objective   Blood pressure 109/75, pulse 91, temperature 98.3 F (36.8 C), temperature source Oral, resp. rate 18, height 5' (1.524 m), weight 77 kg, SpO2 93 %.        Intake/Output Summary (Last 24 hours) at 01/06/2021  1224 Last data filed at 01/06/2021 0859 Gross per 24 hour  Intake 450 ml  Output 1920 ml  Net -1470 ml   Filed Weights   12/31/20 1530 01/02/21 0402  Weight: 76.5 kg 77 kg    General:  Well-nourished, well-appearing HEENT: MM pink/moist Neuro: awake and alert, conversational  CV: s1s2 rrr, no m/r/g PULM:  L chest tube in place, continued serosanguinous drainage, decreased air movement  GI: soft, bsx4 active  Extremities: warm/dry, no edema  Skin: no rashes or lesions     Resolved Hospital Problem list     Assessment & Plan:   Cavitary pneumonia with associate pleural effusion in a male with poor dentition: Favor infectious etiology, now with likely empyema though malignancy is possible.  Pleural fluid studies consistent with inflammatory process. Stable clinical course, pt reports continued  Plan: -last dose TPA/Dornase today -monitor tube output, likely repeat CT and remove chest tube tomorrow if effusion improved -CT surgery reviewed case and felt that no surgical intervention indicated per Dr. Servando Snare -repeat chest CT after lytic course -continue ceftriaxone and Flagyl -PCCM will continue to follow with you    Poor dentition Plan: Outpatient dental referral    COVID-19 infection Incidental, remdesivir x3 days. -on room air, recovering  Best practice (evaluated daily)   Per [facility]  Goals of Care:   Per [facility]  Labs   CBC: Recent Labs  Lab 01/01/21 0025 01/02/21 0401 01/03/21 0404 01/04/21 0345 01/05/21 0052  WBC 16.6* 20.0* 15.8* 10.1 10.5  NEUTROABS 13.7* 16.7* 13.1* 7.5 8.1*  HGB 13.3 11.8* 12.3* 12.4* 12.6*  HCT 41.0 36.3* 34.6* 36.5* 36.0*  MCV 89.7 89.0 87.6 89.2 87.0  PLT 383 319 324 340 409    Basic Metabolic Panel: Recent Labs  Lab 01/01/21 0025 01/02/21 0401 01/03/21 0404 01/04/21 0345 01/05/21 0052  NA 136 134* 135 136 135  K 3.8 3.6 3.4* 3.3* 3.4*  CL 101 101 100 100 100  CO2 25 24 26 27 26   GLUCOSE 111* 116* 114* 107* 124*   BUN 9 9 10 8 10   CREATININE 1.00 0.88 0.75 0.84 0.72  CALCIUM 8.4* 8.0* 7.8* 7.7* 7.8*  MG 1.8 2.0 1.9 2.2 2.0  PHOS 1.9* 2.8 2.6 3.2 3.0   GFR: Estimated Creatinine Clearance: 98.2 mL/min (by C-G formula based on SCr of 0.72 mg/dL). Recent Labs  Lab 12/30/20 2253 12/30/20 2254 12/31/20 0549 12/31/20 0906 01/01/21 0025 01/02/21 0401 01/03/21 0404 01/04/21 0345 01/05/21 0052  PROCALCITON  --   --   --  1.25  --   --   --   --   --   WBC  --    < >  --   --    < > 20.0* 15.8* 10.1 10.5  LATICACIDVEN 1.1  --  1.3  --   --   --   --   --   --    < > = values in this interval not displayed.    Liver Function Tests: Recent Labs  Lab 01/01/21 0025 01/02/21 0401 01/03/21 0404 01/04/21 0345 01/05/21 0052  AST 19 15 18 20 22   ALT 24 17  16 16 16   ALKPHOS 87 64 68 59 56  BILITOT 1.3* 0.7 0.5 0.7 0.5  PROT 7.4 5.9* 6.0* 6.1* 6.3*  ALBUMIN 2.3* 1.8* 1.8* 1.8* 1.8*   No results for input(s): LIPASE, AMYLASE in the last 168 hours. No results for input(s): AMMONIA in the last 168 hours.  ABG    Component Value Date/Time   TCO2 21 (L) 03/17/2018 0342     Coagulation Profile: No results for input(s): INR, PROTIME in the last 168 hours.  Cardiac Enzymes: No results for input(s): CKTOTAL, CKMB, CKMBINDEX, TROPONINI in the last 168 hours.  HbA1C: HbA1c POC (<> result, manual entry)  Date/Time Value Ref Range Status  10/17/2019 03:00 PM 5.4 4.0 - 5.6 % Final   Hgb A1c MFr Bld  Date/Time Value Ref Range Status  03/17/2018 03:34 AM 5.1 4.8 - 5.6 % Final    Comment:    (NOTE) Pre diabetes:          5.7%-6.4% Diabetes:              >6.4% Glycemic control for   <7.0% adults with diabetes   08/14/2016 08:09 AM 5.4 4.8 - 5.6 % Final    Comment:    (NOTE)         Pre-diabetes: 5.7 - 6.4         Diabetes: >6.4         Glycemic control for adults with diabetes: <7.0     CBG: No results for input(s): GLUCAP in the last 168 hours.   Critical care time 30  minutes   Otilio Carpen Joahan Swatzell, PA-C [facility] See Amion for pager If no response to pager , please call [phone] until 7pm After 7:00 pm call Elink  [phone]?[place]

## 2021-01-06 NOTE — Progress Notes (Signed)
PROGRESS NOTE                                                                                                                                                                                                             Patient Demographics:    Joseph Fitzgerald, is a 50 y.o. male, DOB - 10-26-1971, JHE:[id number]  Outpatient Primary MD for the patient is Ladell Pier, MD   Admit date - 12/30/2020   LOS - 6  No chief complaint on file.      Brief Narrative: Patient is a 50 y.o. male who has been followed in the pulmonology clinic for a left lower lung mass-presented to the ED on 2/3 with left-sided chest pain-found to have increasing left lower lobe mass and a loculated pleural effusion.  Evaluated by PCCM-chest tube placed-and subsequently admitted to the hospitalist service.  See below for further details.  COVID-19 vaccinated status: Partially vaccinated  Significant Events: 2/4>> Admit to [facility] for loculated pleural effusion/increasing left lower lobe mass.  Incidental COVID  Significant studies: 2/3>> CTA chest: No PE, complex/loculated pleural effusion-increased left lower lobe mass 2/6>> bilateral lower extremity Doppler: No DVT 2/7>> CT chest without contrast: Diffuse volume and loculated pleural effusion, persistent consolidation with necrosis in the left lower lobe.  Mediastinal lymphadenopathy.  COVID-19 medications: Remdesivir: 2/3>> 2/5  Antibiotics: Levofloxacin: 2/3>> 2/4 Rocephin: 2/4>> Flagyl: 2/4>>  Microbiology data: 2/3 >>blood culture: No growth 2/3>> Gold Quant front:ordered-but not performed-specimen tube underfilled 2/3>> pleural fluid AFB smear: Negative 2/3>> pleural fluid culture: Negative 2/3>> pleural fluid fungal culture>>negative 2/3>>pleural fluid/AFB culture: Pending  Procedures: 2/3>> chest tube placement by PCCM  Consults: PCCM  DVT prophylaxis: enoxaparin (LOVENOX)  injection 40 mg Start: 12/31/20 0845    Subjective:   No major issues overnight.   Assessment  & Plan :   Left cavitary mass with complicated/loculated pleural effusion-likely empyema: Thought to be due to bacterial infection from poor dental hygiene.  Chest tube remains in place-PCCM planning on continued pleural fibrinolytic administration-repeat imaging tomorrow to see if chest tube can be removed.  PCCM discussed with cardiothoracic surgery-patient not a candidate for VATS procedure.  Remains on empiric Rocephin/Flagyl.  Culture data as above.    Poor dentition  COVID-19 infection: Incidental-asymptomatic-Remdesivir x3 days completed on 2/5-apart from watchful observation no further treatment required at this time.  Needs isolation for a total of 10 days from 2/2.  COVID-19 Labs: Recent Labs    01/04/21 0345 01/05/21 0052  DDIMER 5.36* 6.65*  FERRITIN 642* 694*  CRP 22.2* 16.6*    No results found for: BNP  Recent Labs  Lab 12/31/20 0906  PROCALCITON 1.25    Lab Results  Component Value Date   SARSCOV2NAA POSITIVE (A) 12/30/2020   [place] Not Detected 10/01/2020   [place] NEGATIVE 09/08/2019     Obesity: Estimated body mass index is 33.15 kg/m as calculated from the following:   Height as of this encounter: 5' (1.524 m).   Weight as of this encounter: 77 kg.     ABG:    Component Value Date/Time   TCO2 21 (L) 03/17/2018 0342    Vent Settings: N/A    Condition - Stable  Family Communication  :    Code Status :  Full Code  Diet :  Diet Order            Diet regular Room service appropriate? Yes; Fluid consistency: Thin  Diet effective now                  Disposition Plan  :   Status is: Inpatient  Remains inpatient appropriate because:Inpatient level of care appropriate due to severity of illness   Dispo: The patient is from: Home              Anticipated d/c is to: Home              Anticipated d/c date is: > 3 days               Patient currently is not medically stable to d/c.   Difficult to place patient No   Barriers to discharge: Chest tube in place-on IV antibiotics-May require bronchoscopy/CT surgery referral.   Antimicorbials  :    Anti-infectives (From admission, onward)   Start     Dose/Rate Route Frequency Ordered Stop   01/01/21 1145  cefTRIAXone (ROCEPHIN) 2 g in sodium chloride 0.9 % 100 mL IVPB        2 g 200 mL/hr over 30 Minutes Intravenous Every 24 hours 01/01/21 1050     01/01/21 1145  metroNIDAZOLE (FLAGYL) tablet 500 mg        500 mg Oral Every 8 hours 01/01/21 1050     01/01/21 1000  remdesivir 100 mg in sodium chloride 0.9 % 100 mL IVPB       "Followed by" Linked Group Details   100 mg 200 mL/hr over 30 Minutes Intravenous Daily 12/31/20 0841 01/02/21 1040   12/31/20 1000  levofloxacin (LEVAQUIN) tablet 750 mg  Status:  Discontinued        750 mg Oral Daily 12/31/20 0841 01/01/21 1050   12/31/20 1000  remdesivir 200 mg in sodium chloride 0.9% 250 mL IVPB       "Followed by" Linked Group Details   200 mg 580 mL/hr over 30 Minutes Intravenous Once 12/31/20 0841 12/31/20 1256   12/31/20 0630  levofloxacin (LEVAQUIN) IVPB 500 mg  Status:  Discontinued        500 mg 100 mL/hr over 60 Minutes Intravenous  Once 12/31/20 4403 12/31/20 0841      Inpatient Medications  Scheduled Meds: . vitamin C  500 mg Oral Daily  . atorvastatin  10 mg Oral Daily  . enoxaparin (LOVENOX) injection  40 mg Subcutaneous Q24H  . fluticasone  2 spray Each Nare Daily  .  fluticasone furoate-vilanterol  1 puff Inhalation Daily  . metroNIDAZOLE  500 mg Oral Q8H  . sodium chloride flush  10 mL Intracatheter Q8H  . zinc sulfate  220 mg Oral Daily   Continuous Infusions: . sodium chloride 10 mL/hr at 01/01/21 1108  . cefTRIAXone (ROCEPHIN)  IV 2 g (01/06/21 1228)   PRN Meds:.acetaminophen, albuterol, benzonatate, ibuprofen, morphine injection, ondansetron (ZOFRAN) IV, oxyCODONE   Time Spent in minutes   15  See all Orders from today for further details   Oren Binet M.D on 01/06/2021 at 3:26 PM  To page go to [email or web address] - use universal password  [facility] -  Office  [phone]    Objective:   Vitals:   01/06/21 0004 01/06/21 0327 01/06/21 0725 01/06/21 1149  BP: (!) 96/50 (!) 97/58 (!) 94/58 109/75  Pulse: 79 85 76 91  Resp: 18 18 18 18   Temp: 97.6 F (36.4 C) 98.3 F (36.8 C) 98.2 F (36.8 C) 98.3 F (36.8 C)  TempSrc: Axillary Axillary Oral Oral  SpO2: 96% 92% 93%   Weight:      Height:        Wt Readings from Last 3 Encounters:  01/02/21 77 kg  12/07/20 76.5 kg  11/13/20 76.7 kg     Intake/Output Summary (Last 24 hours) at 01/06/2021 1526 Last data filed at 01/06/2021 1450 Gross per 24 hour  Intake 260 ml  Output 2080 ml  Net -1820 ml     Physical Exam Gen Exam:Alert awake-not in any distress HEENT:atraumatic, normocephalic Chest: B/L clear to auscultation anteriorly CVS:S1S2 regular Abdomen:soft non tender, non distended Extremities:no edema Neurology: Non focal Skin: no rash   Data Review:    CBC Recent Labs  Lab 01/01/21 0025 01/02/21 0401 01/03/21 0404 01/04/21 0345 01/05/21 0052  WBC 16.6* 20.0* 15.8* 10.1 10.5  HGB 13.3 11.8* 12.3* 12.4* 12.6*  HCT 41.0 36.3* 34.6* 36.5* 36.0*  PLT 383 319 324 340 367  MCV 89.7 89.0 87.6 89.2 87.0  MCH 29.1 28.9 31.1 30.3 30.4  MCHC 32.4 32.5 35.5 34.0 35.0  RDW 12.7 12.9 13.0 13.2 13.3  LYMPHSABS 1.1 1.3 1.1 1.3 1.0  MONOABS 1.7* 1.7* 1.5* 1.1* 1.3*  EOSABS 0.1 0.0 0.1 0.1 0.1  BASOSABS 0.1 0.1 0.0 0.0 0.0    Chemistries  Recent Labs  Lab 01/01/21 0025 01/02/21 0401 01/03/21 0404 01/04/21 0345 01/05/21 0052  NA 136 134* 135 136 135  K 3.8 3.6 3.4* 3.3* 3.4*  CL 101 101 100 100 100  CO2 25 24 26 27 26   GLUCOSE 111* 116* 114* 107* 124*  BUN 9 9 10 8 10   CREATININE 1.00 0.88 0.75 0.84 0.72  CALCIUM 8.4* 8.0* 7.8* 7.7* 7.8*  MG 1.8 2.0 1.9 2.2 2.0  AST 19 15 18 20 22    ALT 24 17 16 16 16   ALKPHOS 87 64 68 59 56  BILITOT 1.3* 0.7 0.5 0.7 0.5   ------------------------------------------------------------------------------------------------------------------ No results for input(s): CHOL, HDL, LDLCALC, TRIG, CHOLHDL, LDLDIRECT in the last 72 hours.  Lab Results  Component Value Date   HGBA1C 5.4 10/17/2019   ------------------------------------------------------------------------------------------------------------------ No results for input(s): TSH, T4TOTAL, T3FREE, THYROIDAB in the last 72 hours.  Invalid input(s): FREET3 ------------------------------------------------------------------------------------------------------------------ Recent Labs    01/04/21 0345 01/05/21 0052  FERRITIN 642* 694*    Coagulation profile No results for input(s): INR, PROTIME in the last 168 hours.  Recent Labs    01/04/21 0345 01/05/21 0052  DDIMER 5.36* 6.65*    Cardiac  Enzymes No results for input(s): CKMB, TROPONINI, MYOGLOBIN in the last 168 hours.  Invalid input(s): CK ------------------------------------------------------------------------------------------------------------------ No results found for: BNP  Micro Results Recent Results (from the past 240 hour(s))  SARS Coronavirus 2 by RT PCR (hospital order, performed in [facility] hospital lab) Nasopharyngeal Nasopharyngeal Swab     Status: Abnormal   Collection Time: 12/30/20 10:50 PM   Specimen: Nasopharyngeal Swab  Result Value Ref Range Status   SARS Coronavirus 2 POSITIVE (A) NEGATIVE Final    Comment: RESULT CALLED TO, READ BACK BY AND VERIFIED WITH: C CRISCO RN 12/31/20 0044 JDW (NOTE) SARS-CoV-2 target nucleic acids are DETECTED  SARS-CoV-2 RNA is generally detectable in upper respiratory specimens  during the acute phase of infection.  Positive results are indicative  of the presence of the identified virus, but do not rule out bacterial infection or co-infection with other  pathogens not detected by the test.  Clinical correlation with patient history and  other diagnostic information is necessary to determine patient infection status.  The expected result is negative.  Fact Sheet for Patients:   StrictlyIdeas.no   Fact Sheet for Healthcare Providers:   BankingDealers.co.za    This test is not yet approved or cleared by the [place] FDA and  has been authorized for detection and/or diagnosis of SARS-CoV-2 by FDA under an Emergency Use Authorization (EUA).  This EUA will remain in effect (meaning this test can  be used) for the duration of  the COVID-19 declaration under Section 564(b)(1) of the Act, 21 U.S.C. section 360-bbb-3(b)(1), unless the authorization is terminated or revoked sooner.  Performed at [facility], [place] [address] [place], [place] [address]   Culture, blood (routine x 2) Call MD if unable to obtain prior to antibiotics being given     Status: None   Collection Time: 12/31/20  8:42 AM   Specimen: BLOOD  Result Value Ref Range Status   Specimen Description BLOOD LEFT ANTECUBITAL  Final   Special Requests   Final    BOTTLES DRAWN AEROBIC AND ANAEROBIC Blood Culture results may not be optimal due to an inadequate volume of blood received in culture bottles   Culture   Final    NO GROWTH 5 DAYS Performed at [place] [address] [place], [place] [address]    Report Status 01/05/2021 FINAL  Final  Culture, blood (routine x 2) Call MD if unable to obtain prior to antibiotics being given     Status: None   Collection Time: 12/31/20  9:14 AM   Specimen: BLOOD  Result Value Ref Range Status   Specimen Description BLOOD RIGHT ANTECUBITAL  Final   Special Requests   Final    BOTTLES DRAWN AEROBIC AND ANAEROBIC Blood Culture adequate volume   Culture   Final    NO GROWTH 5 DAYS Performed at [facility], [place] [address], [place], [place] [address]    Report  Status 01/05/2021 FINAL  Final  Body fluid culture (includes gram stain)     Status: None   Collection Time: 12/31/20 11:18 AM   Specimen: Pleural Fluid  Result Value Ref Range Status   Specimen Description PLEURAL FLUID  Final   Special Requests NONE  Final   Gram Stain   Final    ABUNDANT WBC PRESENT,BOTH PMN AND MONONUCLEAR NO ORGANISMS SEEN    Culture   Final    NO GROWTH Performed at [facility], [place] [address]  [place], [place] [address]    Report Status 01/03/2021 FINAL  Final  Fungus Culture With Stain     Status: None (Preliminary result)   Collection Time: 12/31/20 11:18 AM   Specimen: Pleural Fluid  Result Value Ref Range Status   Fungus Stain Final report  Final    Comment: (NOTE) Performed At: [facility] [place], [place] [id number] Rush Farmer MD IH:[id number]    Fungus (Mycology) Culture PENDING  Incomplete   Fungal Source PLEURAL  Final    Comment: FLUID Performed at [facility], [place] [address] [place], [place] [address]   Acid Fast Smear (AFB)     Status: None   Collection Time: 12/31/20 11:18 AM   Specimen: Pleural; Respiratory  Result Value Ref Range Status   AFB Specimen Processing Concentration  Final   Acid Fast Smear Negative  Final    Comment: (NOTE) Performed At: [facility] [place], [place] [id number] Rush Farmer MD YS:[id number]    Source (AFB) PLEURAL  Final    Comment: FLUID Performed at [facility], [place] [address], [place], [place] [address]   Fungus Culture Result     Status: None   Collection Time: 12/31/20 11:18 AM  Result Value Ref Range Status   Result 1 Comment  Final    Comment: (NOTE) KOH/Calcofluor preparation:  no fungus observed. Performed At: [facility] [place], [place] [id number] Rush Farmer MD HC:[id number]     Radiology Reports DG Chest 2 View  Result Date: 12/29/2020 CLINICAL DATA:  Cough. Increasing left  lower rib pain. Mass 2 left lower lung. EXAM: CHEST - 2 VIEW COMPARISON:  Radiograph December 13 21.  CT chest 12/01/2020. FINDINGS: Persistent masslike opacity in the left lower lobe, which has increased since 11/09/2020 radiograph. Direct comparison with the 12/01/2020 CT chest is difficult given differences in modality. New small left pleural effusion. No visible pneumothorax. Cardiomediastinal silhouette is within normal limits. No evidence of acute osseous abnormality. IMPRESSION: Persistent masslike opacity in the left lower lobe with new small left pleural effusion. The opacity is increased relative to prior radiograph from 11/09/2020. Comparison with recent CT chest is limited given differences in modality. Differential considerations remain pneumonia versus malignancy and recommend follow-up chest CT as directed from the 12/01/2020 CT chest. Electronically Signed   By: Margaretha Sheffield MD   On: 12/29/2020 13:22   CT CHEST WO CONTRAST  Result Date: 01/04/2021 CLINICAL DATA:  50 year old with loculated left pleural effusion and chest tube. Patient has undergone pleural fibrinolytic therapy. EXAM: CT CHEST WITHOUT CONTRAST TECHNIQUE: Multidetector CT imaging of the chest was performed following the standard protocol without IV contrast. COMPARISON:  Chest CT 12/31/2020 and chest radiograph 01/03/2021 and chest CT 12/01/2020 FINDINGS: Cardiovascular: Normal caliber of the thoracic aorta without atherosclerotic calcifications. Small pericardial effusion which appears to be slightly increased. Heart size is normal. Mediastinum/Nodes: Prominent subcarinal tissue measuring up to 1.8 cm in short axis on sequence 3, image 27 and similar to the recent comparison examination. However, subcarinal tissue only measured 1.1 cm in the short axis on 12/01/2020. Prominent right paratracheal lymph node measuring 0.9 cm in the short axis on sequence 3, image 20. Again noted are multiple small left axillary lymph nodes.  Lungs/Pleura: Left posterior pigtail drain in the pleural space. The drain was placed along the medial aspect of the left lower chest. Overall, the volume of pleural fluid has decreased, particularly in the left lower chest.  There is a small amount of residual pleural thickening or pleural fluid in the left lower chest. Residual loculated pleural fluid in the lingular region. Multiple pockets of pleural air. Residual small pockets of loculated pleural fluid near the superior segment of the left lower lobe. Persistent area of consolidation in the left lower lobe with evidence of central necrosis on sequence 6, image 81. This area roughly measures 4.8 x 4.9 cm. This area roughly measured 5.5 x 3.5 cm on 12/01/2020. Patchy densities throughout the left lower lung compatible with areas of consolidation and volume loss. Right lung is clear. Negative for a right pleural effusion. Upper Abdomen: Images of the upper abdomen are unremarkable. Musculoskeletal: No acute bone abnormality. IMPRESSION: 1. Loculated left pleural effusion has decreased in volume. There continues to be scattered areas of loculated left pleural fluid with pockets of pleural air. Chest tube is positioned in the medial posterior left chest. 2. Persistent consolidation with necrosis in the left lower lobe. This area has persisted since 12/01/2020. Findings are concerning for necrotic pneumonia but an underlying malignancy can not be excluded. 3. Mediastinal lymphadenopathy. This lymphadenopathy has not significantly changed since the recent comparison examination but has increased since 12/01/2020. This lymphadenopathy could be reactive but indeterminate. 4. Small but slightly increased pericardial effusion. Electronically Signed   By: Markus Daft M.D.   On: 01/04/2021 08:30   CT ANGIO CHEST PE W OR WO CONTRAST  Result Date: 12/31/2020 CLINICAL DATA:  Shortness of breath EXAM: CT ANGIOGRAPHY CHEST WITH CONTRAST TECHNIQUE: Multidetector CT imaging of  the chest was performed using the standard protocol during bolus administration of intravenous contrast. Multiplanar CT image reconstructions and MIPs were obtained to evaluate the vascular anatomy. CONTRAST:  148m OMNIPAQUE IOHEXOL 350 MG/ML SOLN COMPARISON:  CT 12/01/2020, 10/14/2020 FINDINGS: Cardiovascular: Satisfactory opacification the pulmonary arteries to the segmental level. No pulmonary artery filling defects are identified. Central pulmonary arteries are top-normal caliber. Cardiac size is top normal. No sizable pericardial effusion. There is however some inflammation in the adjacent paracardial/mediastinal fat which is likely secondary to the it extensive process in the left lung. The aorta is normal caliber. No acute luminal abnormality of the imaged aorta. No periaortic stranding or hemorrhage. Normal 3 vessel branching of the aortic arch. Proximal great vessels are unremarkable. No major venous abnormalities are seen. Mediastinum/Nodes: Stranding and thickening in the left anterior mediastinal/paracardial fat. No mediastinal free fluid or air. Normal thyroid gland and thoracic inlet. No acute abnormality of the trachea or esophagus. Scattered low-attenuation subcentimeter mediastinal and hilar adenopathy. Several larger subcarinal and left hilar nodes are present measuring up to, 1.5 cm and 1.2 cm respectively (7/189), quite similar to prior. There is some increasing left axillary adenopathy is present as well with some mild surrounding stranding which could reflect a reactive process. Lungs/Pleura: Increasing consolidative opacity centered in the left lower with hypoattenuation of the consolidated parenchyma suggestive of some underlying infection with scattered air lucencies, possibly developing cavitation (6/77). Development of a complex, loculated pleural effusion extending over the lung apex, into the fissures, and throughout the lung base. Suspect some reactive stranding of the adjacent  mediastinal fat as detailed above. Several areas suggest some associated pleural thickening which could reflect a developing empyema. Right lung remains essentially clear without visible right pleural effusion. No pneumothorax. Upper Abdomen: No acute abnormalities present in the visualized portions of the upper abdomen. Musculoskeletal: No acute osseous abnormality or suspicious osseous lesion. Multilevel degenerative changes are present in the imaged portions  of the spine. No clear involvement of the chest wall. No erosive or destructive changes of the adjacent ribs. No worrisome chest wall masses or lesions. Review of the MIP images confirms the above findings. IMPRESSION: 1. No pulmonary embolism is seen. 2. Increasing consolidative opacity centered in the left lower with hypoattenuation of the consolidated parenchyma suggestive of some underlying infection with scattered air lucencies, possibly developing cavitation or necrosis. Additional development of a complex, loculated pleural effusion extending over the lung apex, into the fissure, and throughout the lung base. Several areas suggest some associated pleural thickening/empyema. Continued follow-up to resolution is recommended as an underlying mass/malignancy is difficult to fully exclude. 3. Some increasing scattered subcentimeter hypoattenuating mediastinal, hilar and left axillary adenopathy, may be reactive though continued attention on follow-up imaging is warranted pending exclusion of malignancy. These results were called by telephone at the time of interpretation on 12/31/2020 at 6:16 am to provider [facility] , who verbally acknowledged these results. Electronically Signed   By: Lovena Le M.D.   On: 12/31/2020 06:17   DG Chest Port 1 View  Result Date: 01/06/2021 CLINICAL DATA:  Follow-up left chest tube placement EXAM: PORTABLE CHEST 1 VIEW COMPARISON:  01/05/2021 FINDINGS: Pigtail catheter is again noted over the left lung base. Persistent  atelectatic changes are seen. No sizable effusion is noted. No pneumothorax is seen. Right lung remains clear. Cardiac shadow is stable. IMPRESSION: No significant recurrent pleural effusion is noted. Persistent left basilar atelectasis is seen. Electronically Signed   By: Inez Catalina M.D.   On: 01/06/2021 11:14   DG Chest Port 1 View  Result Date: 01/05/2021 CLINICAL DATA:  Chest tube placement for empyema EXAM: PORTABLE CHEST 1 VIEW COMPARISON:  Chest radiograph January 03, 2021 and chest CT January 04, 2021 FINDINGS: Pigtail catheter present on the left inferomedially, stable. Ill-defined areas of airspace opacity in the left lower lung region appear essentially stable. There are areas of questionable cavitation in this area, better seen on recent CT. There is ill-defined opacity in the left upper and mid lung regions, slightly less than on recent studies. Right lung is clear. Heart size and pulmonary vascularity are normal. No adenopathy. No bone lesions. IMPRESSION: Stable catheter position inferomedially on the left. No pneumothorax. Airspace consolidation with areas of questionable cavitation in this region, better delineated on recent CT. Slightly less opacity left upper and mid lung regions. No new opacities evident. Right lung clear. Stable cardiac silhouette. Electronically Signed   By: Lowella Grip III M.D.   On: 01/05/2021 09:57   DG CHEST PORT 1 VIEW  Result Date: 01/03/2021 CLINICAL DATA:  Chest pain. EXAM: PORTABLE CHEST 1 VIEW COMPARISON:  January 02, 2021. FINDINGS: Stable cardiomegaly. Right lung is clear. Stable position of left-sided chest tube is noted with grossly stable left pleural effusion and associated atelectasis or infiltrate. Bony thorax is unremarkable. No pneumothorax is noted. IMPRESSION: Stable position of left-sided chest tube with grossly stable left pleural effusion and associated atelectasis or infiltrate. Electronically Signed   By: Marijo Conception M.D.   On:  01/03/2021 08:38   DG Chest Port 1 View  Result Date: 01/02/2021 CLINICAL DATA:  Shortness of breath, COVID-19 positive. EXAM: PORTABLE CHEST 1 VIEW COMPARISON:  January 01, 2021. FINDINGS: Stable cardiomegaly. No pneumothorax is noted. Stable position of left-sided chest tube. Right lung is clear. Loculated left pleural effusion noted on prior exam is significantly smaller currently. Stable left basilar atelectasis or infiltrate is noted. Bony thorax is  unremarkable. IMPRESSION: Stable position of left-sided chest tube. Loculated left pleural effusion noted on prior exam is significantly smaller currently. Stable left basilar atelectasis or infiltrate is noted. Electronically Signed   By: Marijo Conception M.D.   On: 01/02/2021 09:53   DG CHEST PORT 1 VIEW  Result Date: 01/01/2021 CLINICAL DATA:  Pleural effusion, chest tube, COVID pneumonia EXAM: PORTABLE CHEST 1 VIEW COMPARISON:  12/31/2020 FINDINGS: Large, partially laterally loculated left pleural effusion has enlarged in size. Left basilar pigtail chest tube positioned inferomedially is unchanged. Interval development of mild mediastinal shift to the right. Right lung is clear. No pneumothorax. No pleural effusion on the right. Cardiac size within normal limits. IMPRESSION: Left chest tube unchanged. Progressive enlargement of a partially loculated left pleural effusion with developing mediastinal shift to the right. CT examination may be helpful to assess catheter position in relation to the developing pleural fluid. Electronically Signed   By: Fidela Salisbury MD   On: 01/01/2021 08:15   DG CHEST PORT 1 VIEW  Result Date: 12/31/2020 CLINICAL DATA:  Loculated pleural effusion. EXAM: PORTABLE CHEST 1 VIEW COMPARISON:  CT 12/31/2020.  Chest x-ray 12/29/2020. FINDINGS: Left chest tube noted over the left lower chest. Prominent left pleural effusion with possible loculation. Underlying left base atelectasis/infiltrate most likely present. No pneumothorax.  Heart size stable. IMPRESSION: Left chest tube noted over the left lower chest. Prominent left pleural effusion with possible loculation. Underlying left base atelectasis/infiltrate most likely present. No pneumothorax. Electronically Signed   By: Marcello Moores  Register   On: 12/31/2020 11:43   VAS [place] LOWER EXTREMITY VENOUS (DVT)  Result Date: 01/03/2021  Lower Venous DVT Study Indications: Covid, elevated d-dimer.  Anticoagulation: Lovenox. Comparison Study: No prior studies. Performing Technologist: Darlin Coco RDMS  Examination Guidelines: A complete evaluation includes B-mode imaging, spectral Doppler, color Doppler, and power Doppler as needed of all accessible portions of each vessel. Bilateral testing is considered an integral part of a complete examination. Limited examinations for reoccurring indications may be performed as noted. The reflux portion of the exam is performed with the patient in reverse Trendelenburg.  +---------+---------------+---------+-----------+----------+--------------+ RIGHT    CompressibilityPhasicitySpontaneityPropertiesThrombus Aging +---------+---------------+---------+-----------+----------+--------------+ CFV      Full           Yes      Yes                                 +---------+---------------+---------+-----------+----------+--------------+ SFJ      Full                                                        +---------+---------------+---------+-----------+----------+--------------+ FV Prox  Full                                                        +---------+---------------+---------+-----------+----------+--------------+ FV Mid   Full                                                        +---------+---------------+---------+-----------+----------+--------------+  FV DistalFull                                                        +---------+---------------+---------+-----------+----------+--------------+ PFV      Full                                                         +---------+---------------+---------+-----------+----------+--------------+ POP      Full           Yes      Yes                                 +---------+---------------+---------+-----------+----------+--------------+ PTV      Full                                                        +---------+---------------+---------+-----------+----------+--------------+ PERO     Full                                                        +---------+---------------+---------+-----------+----------+--------------+   +---------+---------------+---------+-----------+----------+--------------+ LEFT     CompressibilityPhasicitySpontaneityPropertiesThrombus Aging +---------+---------------+---------+-----------+----------+--------------+ CFV      Full           Yes      Yes                                 +---------+---------------+---------+-----------+----------+--------------+ SFJ      Full                                                        +---------+---------------+---------+-----------+----------+--------------+ FV Prox  Full                                                        +---------+---------------+---------+-----------+----------+--------------+ FV Mid   Full                                                        +---------+---------------+---------+-----------+----------+--------------+ FV DistalFull                                                        +---------+---------------+---------+-----------+----------+--------------+  PFV      Full                                                        +---------+---------------+---------+-----------+----------+--------------+ POP      Full           Yes      Yes                                 +---------+---------------+---------+-----------+----------+--------------+ PTV      Full                                                         +---------+---------------+---------+-----------+----------+--------------+ PERO     Full                                                        +---------+---------------+---------+-----------+----------+--------------+     Summary: RIGHT: - There is no evidence of deep vein thrombosis in the lower extremity.  - No cystic structure found in the popliteal fossa.  LEFT: - There is no evidence of deep vein thrombosis in the lower extremity.  - No cystic structure found in the popliteal fossa.  *See table(s) above for measurements and observations. Electronically signed by Monica Martinez MD on 01/03/2021 at 2:28:49 PM.    Final

## 2021-01-07 LAB — BASIC METABOLIC PANEL
Anion gap: 10 (ref 5–15)
BUN: 7 mg/dL (ref 6–20)
CO2: 25 mmol/L (ref 22–32)
Calcium: 8.1 mg/dL — ABNORMAL LOW (ref 8.9–10.3)
Chloride: 103 mmol/L (ref 98–111)
Creatinine, Ser: 0.67 mg/dL (ref 0.61–1.24)
GFR, Estimated: >60 mL/min (ref >60)
Glucose, Bld: 118 mg/dL — ABNORMAL HIGH (ref 70–99)
Potassium: 3.8 mmol/L (ref 3.5–5.1)
Sodium: 138 mmol/L (ref 135–145)

## 2021-01-07 LAB — CBC
HCT: 39.1 % (ref 39.0–52.0)
Hemoglobin: 13.4 g/dL (ref 13.0–17.0)
MCH: 30.4 pg (ref 26.0–34.0)
MCHC: 34.3 g/dL (ref 30.0–36.0)
MCV: 88.7 fL (ref 80.0–100.0)
Platelets: 396 10*3/uL (ref 150–400)
RBC: 4.41 MIL/uL (ref 4.22–5.81)
RDW: 13.4 % (ref 11.5–15.5)
WBC: 8.2 10*3/uL (ref 4.0–10.5)
nRBC: 0 % (ref 0.0–0.2)

## 2021-01-07 NOTE — Progress Notes (Signed)
PROGRESS NOTE                                                                                                                                                                                                             Patient Demographics:    Joseph Fitzgerald, is a 50 y.o. male, DOB - [birth year], JSH:[id number]  Outpatient Primary MD for the patient is Ladell Pier, MD   Admit date - 12/30/2020   LOS - 7  No chief complaint on file.      Brief Narrative: Patient is a 50 y.o. male who has been followed in the pulmonology clinic for a left lower lung mass-presented to the ED on 2/3 with left-sided chest pain-found to have increasing left lower lobe mass and a loculated pleural effusion.  Evaluated by PCCM-chest tube placed-and subsequently admitted to the hospitalist service.  See below for further details.  COVID-19 vaccinated status: Partially vaccinated  Significant Events: 2/4>> Admit to [facility] for loculated pleural effusion/increasing left lower lobe mass.  Incidental COVID  Significant studies: 2/3>> CTA chest: No PE, complex/loculated pleural effusion-increased left lower lobe mass 2/6>> bilateral lower extremity Doppler: No DVT 2/7>> CT chest without contrast: Diffuse volume and loculated pleural effusion, persistent consolidation with necrosis in the left lower lobe.  Mediastinal lymphadenopathy.  COVID-19 medications: Remdesivir: 2/3>> 2/5  Antibiotics: Levofloxacin: 2/3>> 2/4 Rocephin: 2/4>> Flagyl: 2/4>>  Microbiology data: 2/3 >>blood culture: No growth 2/3>> Gold Quant front:ordered-but not performed-specimen tube underfilled 2/3>> pleural fluid AFB smear: Negative 2/3>> pleural fluid culture: Negative 2/3>> pleural fluid fungal culture>>negative 2/3>>pleural fluid/AFB culture: Pending  Procedures: 2/3>> chest tube placement by PCCM  Consults: PCCM  DVT prophylaxis: enoxaparin (LOVENOX)  injection 40 mg Start: 12/31/20 0845    Subjective:   No chest pain-no shortness of breath-lying comfortably in bed.  Assessment  & Plan :   Left cavitary mass with complicated/loculated pleural effusion-likely empyema: Thought to be due to bacterial infection from poor dental hygiene.  Chest tube remains in place-PCCM planning on continued pleural fibrinolytic administration-repeat imaging tomorrow to see if chest tube can be removed.  PCCM discussed with cardiothoracic surgery-patient not a candidate for VATS procedure.  Remains on empiric Rocephin/Flagyl.  Culture data as above.    Poor dentition  COVID-19 infection: Incidental-asymptomatic-Remdesivir x3 days completed on 2/5-apart from watchful observation no further treatment  required at this time.  Needs isolation for a total of 10 days from 2/2.  COVID-19 Labs: Recent Labs    01/05/21 0052  DDIMER 6.65*  FERRITIN 694*  CRP 16.6*    No results found for: BNP  No results for input(s): PROCALCITON in the last 168 hours.  Lab Results  Component Value Date   SARSCOV2NAA POSITIVE (A) 12/30/2020   [place] Not Detected 10/01/2020   [place] NEGATIVE 09/08/2019     Obesity: Estimated body mass index is 33.15 kg/m as calculated from the following:   Height as of this encounter: 5' (1.524 m).   Weight as of this encounter: 77 kg.     ABG:    Component Value Date/Time   TCO2 21 (L) 03/17/2018 0342    Vent Settings: N/A    Condition - Stable  Family Communication  :    Code Status :  Full Code  Diet :  Diet Order            Diet regular Room service appropriate? Yes; Fluid consistency: Thin  Diet effective now                  Disposition Plan  :   Status is: Inpatient  Remains inpatient appropriate because:Inpatient level of care appropriate due to severity of illness   Dispo: The patient is from: Home              Anticipated d/c is to: Home              Anticipated d/c date is: > 3 days               Patient currently is not medically stable to d/c.   Difficult to place patient No   Barriers to discharge: Chest tube in place-on IV antibiotics-May require bronchoscopy/CT surgery referral.   Antimicorbials  :    Anti-infectives (From admission, onward)   Start     Dose/Rate Route Frequency Ordered Stop   01/01/21 1145  cefTRIAXone (ROCEPHIN) 2 g in sodium chloride 0.9 % 100 mL IVPB        2 g 200 mL/hr over 30 Minutes Intravenous Every 24 hours 01/01/21 1050     01/01/21 1145  metroNIDAZOLE (FLAGYL) tablet 500 mg        500 mg Oral Every 8 hours 01/01/21 1050     01/01/21 1000  remdesivir 100 mg in sodium chloride 0.9 % 100 mL IVPB       "Followed by" Linked Group Details   100 mg 200 mL/hr over 30 Minutes Intravenous Daily 12/31/20 0841 01/02/21 1040   12/31/20 1000  levofloxacin (LEVAQUIN) tablet 750 mg  Status:  Discontinued        750 mg Oral Daily 12/31/20 0841 01/01/21 1050   12/31/20 1000  remdesivir 200 mg in sodium chloride 0.9% 250 mL IVPB       "Followed by" Linked Group Details   200 mg 580 mL/hr over 30 Minutes Intravenous Once 12/31/20 0841 12/31/20 1256   12/31/20 0630  levofloxacin (LEVAQUIN) IVPB 500 mg  Status:  Discontinued        500 mg 100 mL/hr over 60 Minutes Intravenous  Once 12/31/20 4010 12/31/20 0841      Inpatient Medications  Scheduled Meds: . vitamin C  500 mg Oral Daily  . atorvastatin  10 mg Oral Daily  . enoxaparin (LOVENOX) injection  40 mg Subcutaneous Q24H  . fluticasone  2 spray Each Nare Daily  .  fluticasone furoate-vilanterol  1 puff Inhalation Daily  . metroNIDAZOLE  500 mg Oral Q8H  . sodium chloride flush  10 mL Intracatheter Q8H  . zinc sulfate  220 mg Oral Daily   Continuous Infusions: . sodium chloride 10 mL/hr at 01/07/21 0938  . cefTRIAXone (ROCEPHIN)  IV 2 g (01/07/21 1204)   PRN Meds:.acetaminophen, albuterol, benzonatate, ibuprofen, morphine injection, ondansetron (ZOFRAN) IV, oxyCODONE   Time Spent in  minutes  15  See all Orders from today for further details   Oren Binet M.D on 01/07/2021 at 12:15 PM  To page go to [email or web address] - use universal password  [facility] -  Office  [phone]    Objective:   Vitals:   01/06/21 1600 01/06/21 1925 01/07/21 0442 01/07/21 0930  BP: 111/66 (!) 99/58 (!) 89/45   Pulse: 70 84 71   Resp: 20 18 20    Temp: 98.4 F (36.9 C) 98.3 F (36.8 C) 98.4 F (36.9 C)   TempSrc: Oral Oral Oral   SpO2:  93% 92% 96%  Weight:      Height:        Wt Readings from Last 3 Encounters:  01/02/21 77 kg  12/07/20 76.5 kg  11/13/20 76.7 kg     Intake/Output Summary (Last 24 hours) at 01/07/2021 1215 Last data filed at 01/07/2021 1012 Gross per 24 hour  Intake 760 ml  Output 210 ml  Net 550 ml     Physical Exam Gen Exam:Alert awake-not in any distress HEENT:atraumatic, normocephalic Chest: B/L clear to auscultation anteriorly CVS:S1S2 regular Abdomen:soft non tender, non distended Extremities:no edema Neurology: Non focal Skin: no rash   Data Review:    CBC Recent Labs  Lab 01/01/21 0025 01/02/21 0401 01/03/21 0404 01/04/21 0345 01/05/21 0052 01/07/21 0756  WBC 16.6* 20.0* 15.8* 10.1 10.5 8.2  HGB 13.3 11.8* 12.3* 12.4* 12.6* 13.4  HCT 41.0 36.3* 34.6* 36.5* 36.0* 39.1  PLT 383 319 324 340 367 396  MCV 89.7 89.0 87.6 89.2 87.0 88.7  MCH 29.1 28.9 31.1 30.3 30.4 30.4  MCHC 32.4 32.5 35.5 34.0 35.0 34.3  RDW 12.7 12.9 13.0 13.2 13.3 13.4  LYMPHSABS 1.1 1.3 1.1 1.3 1.0  --   MONOABS 1.7* 1.7* 1.5* 1.1* 1.3*  --   EOSABS 0.1 0.0 0.1 0.1 0.1  --   BASOSABS 0.1 0.1 0.0 0.0 0.0  --     Chemistries  Recent Labs  Lab 01/01/21 0025 01/02/21 0401 01/03/21 0404 01/04/21 0345 01/05/21 0052 01/07/21 0756  NA 136 134* 135 136 135 138  K 3.8 3.6 3.4* 3.3* 3.4* 3.8  CL 101 101 100 100 100 103  CO2 25 24 26 27 26 25   GLUCOSE 111* 116* 114* 107* 124* 118*  BUN 9 9 10 8 10 7   CREATININE 1.00 0.88 0.75 0.84 0.72  0.67  CALCIUM 8.4* 8.0* 7.8* 7.7* 7.8* 8.1*  MG 1.8 2.0 1.9 2.2 2.0  --   AST 19 15 18 20 22   --   ALT 24 17 16 16 16   --   ALKPHOS 87 64 68 59 56  --   BILITOT 1.3* 0.7 0.5 0.7 0.5  --    ------------------------------------------------------------------------------------------------------------------ No results for input(s): CHOL, HDL, LDLCALC, TRIG, CHOLHDL, LDLDIRECT in the last 72 hours.  Lab Results  Component Value Date   HGBA1C 5.4 10/17/2019   ------------------------------------------------------------------------------------------------------------------ No results for input(s): TSH, T4TOTAL, T3FREE, THYROIDAB in the last 72 hours.  Invalid input(s): FREET3 ------------------------------------------------------------------------------------------------------------------ Recent Labs  01/05/21 0052  FERRITIN 694*    Coagulation profile No results for input(s): INR, PROTIME in the last 168 hours.  Recent Labs    01/05/21 0052  DDIMER 6.65*    Cardiac Enzymes No results for input(s): CKMB, TROPONINI, MYOGLOBIN in the last 168 hours.  Invalid input(s): CK ------------------------------------------------------------------------------------------------------------------ No results found for: BNP  Micro Results Recent Results (from the past 240 hour(s))  SARS Coronavirus 2 by RT PCR (hospital order, performed in [facility] hospital lab) Nasopharyngeal Nasopharyngeal Swab     Status: Abnormal   Collection Time: 12/30/20 10:50 PM   Specimen: Nasopharyngeal Swab  Result Value Ref Range Status   SARS Coronavirus 2 POSITIVE (A) NEGATIVE Final    Comment: RESULT CALLED TO, READ BACK BY AND VERIFIED WITH: C CRISCO RN 12/31/20 0044 JDW (NOTE) SARS-CoV-2 target nucleic acids are DETECTED  SARS-CoV-2 RNA is generally detectable in upper respiratory specimens  during the acute phase of infection.  Positive results are indicative  of the presence of the identified  virus, but do not rule out bacterial infection or co-infection with other pathogens not detected by the test.  Clinical correlation with patient history and  other diagnostic information is necessary to determine patient infection status.  The expected result is negative.  Fact Sheet for Patients:   StrictlyIdeas.no   Fact Sheet for Healthcare Providers:   BankingDealers.co.za    This test is not yet approved or cleared by the [place] FDA and  has been authorized for detection and/or diagnosis of SARS-CoV-2 by FDA under an Emergency Use Authorization (EUA).  This EUA will remain in effect (meaning this test can  be used) for the duration of  the COVID-19 declaration under Section 564(b)(1) of the Act, 21 U.S.C. section 360-bbb-3(b)(1), unless the authorization is terminated or revoked sooner.  Performed at [facility], [place] [address] [place], [place] [address]   Culture, blood (routine x 2) Call MD if unable to obtain prior to antibiotics being given     Status: None   Collection Time: 12/31/20  8:42 AM   Specimen: BLOOD  Result Value Ref Range Status   Specimen Description BLOOD LEFT ANTECUBITAL  Final   Special Requests   Final    BOTTLES DRAWN AEROBIC AND ANAEROBIC Blood Culture results may not be optimal due to an inadequate volume of blood received in culture bottles   Culture   Final    NO GROWTH 5 DAYS Performed at [place] [address], [place], [place] [address]    Report Status 01/05/2021 FINAL  Final  Culture, blood (routine x 2) Call MD if unable to obtain prior to antibiotics being given     Status: None   Collection Time: 12/31/20  9:14 AM   Specimen: BLOOD  Result Value Ref Range Status   Specimen Description BLOOD RIGHT ANTECUBITAL  Final   Special Requests   Final    BOTTLES DRAWN AEROBIC AND ANAEROBIC Blood Culture adequate volume   Culture   Final    NO GROWTH 5 DAYS Performed at  [facility], [place] [address] [place], [place] [address]    Report Status 01/05/2021 FINAL  Final  Body fluid culture (includes gram stain)     Status: None   Collection Time: 12/31/20 11:18 AM   Specimen: Pleural Fluid  Result Value Ref Range Status   Specimen Description PLEURAL FLUID  Final   Special Requests NONE  Final   Gram Stain  Final    ABUNDANT WBC PRESENT,BOTH PMN AND MONONUCLEAR NO ORGANISMS SEEN    Culture   Final    NO GROWTH Performed at [facility], [address] [place], [place] [address]    Report Status 01/03/2021 FINAL  Final  Fungus Culture With Stain     Status: None (Preliminary result)   Collection Time: 12/31/20 11:18 AM   Specimen: Pleural Fluid  Result Value Ref Range Status   Fungus Stain Final report  Final    Comment: (NOTE) Performed At: [facility] [place], [place] [id number] Rush Farmer MD [id number]    Fungus (Mycology) Culture PENDING  Incomplete   Fungal Source PLEURAL  Final    Comment: FLUID Performed at [facility], [place] [address] [place], [place] [address]   Acid Fast Smear (AFB)     Status: None   Collection Time: 12/31/20 11:18 AM   Specimen: Pleural; Respiratory  Result Value Ref Range Status   AFB Specimen Processing Concentration  Final   Acid Fast Smear Negative  Final    Comment: (NOTE) Performed At: [facility] [place], [place] [id number] Rush Farmer MD [id number]    Source (AFB) PLEURAL  Final    Comment: FLUID Performed at [facility], [place] [address], [place], [place] [address]   Fungus Culture Result     Status: None   Collection Time: 12/31/20 11:18 AM  Result Value Ref Range Status   Result 1 Comment  Final    Comment: (NOTE) KOH/Calcofluor preparation:  no fungus observed. Performed At: [facility] [place], [place] [id number] Rush Farmer MD CZ:[id number]     Radiology Reports DG Chest 2  View  Result Date: 12/29/2020 CLINICAL DATA:  Cough. Increasing left lower rib pain. Mass 2 left lower lung. EXAM: CHEST - 2 VIEW COMPARISON:  Radiograph December 13 21.  CT chest 12/01/2020. FINDINGS: Persistent masslike opacity in the left lower lobe, which has increased since 11/09/2020 radiograph. Direct comparison with the 12/01/2020 CT chest is difficult given differences in modality. New small left pleural effusion. No visible pneumothorax. Cardiomediastinal silhouette is within normal limits. No evidence of acute osseous abnormality. IMPRESSION: Persistent masslike opacity in the left lower lobe with new small left pleural effusion. The opacity is increased relative to prior radiograph from 11/09/2020. Comparison with recent CT chest is limited given differences in modality. Differential considerations remain pneumonia versus malignancy and recommend follow-up chest CT as directed from the 12/01/2020 CT chest. Electronically Signed   By: Margaretha Sheffield MD   On: 12/29/2020 13:22   CT CHEST WO CONTRAST  Result Date: 01/04/2021 CLINICAL DATA:  50 year old with loculated left pleural effusion and chest tube. Patient has undergone pleural fibrinolytic therapy. EXAM: CT CHEST WITHOUT CONTRAST TECHNIQUE: Multidetector CT imaging of the chest was performed following the standard protocol without IV contrast. COMPARISON:  Chest CT 12/31/2020 and chest radiograph 01/03/2021 and chest CT 12/01/2020 FINDINGS: Cardiovascular: Normal caliber of the thoracic aorta without atherosclerotic calcifications. Small pericardial effusion which appears to be slightly increased. Heart size is normal. Mediastinum/Nodes: Prominent subcarinal tissue measuring up to 1.8 cm in short axis on sequence 3, image 27 and similar to the recent comparison examination. However, subcarinal tissue only measured 1.1 cm in the short axis on 12/01/2020. Prominent right paratracheal lymph node measuring 0.9 cm in the short axis on sequence 3,  image 20. Again noted are multiple small left axillary lymph nodes. Lungs/Pleura:  Left posterior pigtail drain in the pleural space. The drain was placed along the medial aspect of the left lower chest. Overall, the volume of pleural fluid has decreased, particularly in the left lower chest. There is a small amount of residual pleural thickening or pleural fluid in the left lower chest. Residual loculated pleural fluid in the lingular region. Multiple pockets of pleural air. Residual small pockets of loculated pleural fluid near the superior segment of the left lower lobe. Persistent area of consolidation in the left lower lobe with evidence of central necrosis on sequence 6, image 81. This area roughly measures 4.8 x 4.9 cm. This area roughly measured 5.5 x 3.5 cm on 12/01/2020. Patchy densities throughout the left lower lung compatible with areas of consolidation and volume loss. Right lung is clear. Negative for a right pleural effusion. Upper Abdomen: Images of the upper abdomen are unremarkable. Musculoskeletal: No acute bone abnormality. IMPRESSION: 1. Loculated left pleural effusion has decreased in volume. There continues to be scattered areas of loculated left pleural fluid with pockets of pleural air. Chest tube is positioned in the medial posterior left chest. 2. Persistent consolidation with necrosis in the left lower lobe. This area has persisted since 12/01/2020. Findings are concerning for necrotic pneumonia but an underlying malignancy can not be excluded. 3. Mediastinal lymphadenopathy. This lymphadenopathy has not significantly changed since the recent comparison examination but has increased since 12/01/2020. This lymphadenopathy could be reactive but indeterminate. 4. Small but slightly increased pericardial effusion. Electronically Signed   By: Markus Daft M.D.   On: 01/04/2021 08:30   CT ANGIO CHEST PE W OR WO CONTRAST  Result Date: 12/31/2020 CLINICAL DATA:  Shortness of breath EXAM: CT  ANGIOGRAPHY CHEST WITH CONTRAST TECHNIQUE: Multidetector CT imaging of the chest was performed using the standard protocol during bolus administration of intravenous contrast. Multiplanar CT image reconstructions and MIPs were obtained to evaluate the vascular anatomy. CONTRAST:  12m OMNIPAQUE IOHEXOL 350 MG/ML SOLN COMPARISON:  CT 12/01/2020, 10/14/2020 FINDINGS: Cardiovascular: Satisfactory opacification the pulmonary arteries to the segmental level. No pulmonary artery filling defects are identified. Central pulmonary arteries are top-normal caliber. Cardiac size is top normal. No sizable pericardial effusion. There is however some inflammation in the adjacent paracardial/mediastinal fat which is likely secondary to the it extensive process in the left lung. The aorta is normal caliber. No acute luminal abnormality of the imaged aorta. No periaortic stranding or hemorrhage. Normal 3 vessel branching of the aortic arch. Proximal great vessels are unremarkable. No major venous abnormalities are seen. Mediastinum/Nodes: Stranding and thickening in the left anterior mediastinal/paracardial fat. No mediastinal free fluid or air. Normal thyroid gland and thoracic inlet. No acute abnormality of the trachea or esophagus. Scattered low-attenuation subcentimeter mediastinal and hilar adenopathy. Several larger subcarinal and left hilar nodes are present measuring up to, 1.5 cm and 1.2 cm respectively (7/189), quite similar to prior. There is some increasing left axillary adenopathy is present as well with some mild surrounding stranding which could reflect a reactive process. Lungs/Pleura: Increasing consolidative opacity centered in the left lower with hypoattenuation of the consolidated parenchyma suggestive of some underlying infection with scattered air lucencies, possibly developing cavitation (6/77). Development of a complex, loculated pleural effusion extending over the lung apex, into the fissures, and throughout  the lung base. Suspect some reactive stranding of the adjacent mediastinal fat as detailed above. Several areas suggest some associated pleural thickening which could reflect a developing empyema. Right lung remains essentially clear without visible right pleural  effusion. No pneumothorax. Upper Abdomen: No acute abnormalities present in the visualized portions of the upper abdomen. Musculoskeletal: No acute osseous abnormality or suspicious osseous lesion. Multilevel degenerative changes are present in the imaged portions of the spine. No clear involvement of the chest wall. No erosive or destructive changes of the adjacent ribs. No worrisome chest wall masses or lesions. Review of the MIP images confirms the above findings. IMPRESSION: 1. No pulmonary embolism is seen. 2. Increasing consolidative opacity centered in the left lower with hypoattenuation of the consolidated parenchyma suggestive of some underlying infection with scattered air lucencies, possibly developing cavitation or necrosis. Additional development of a complex, loculated pleural effusion extending over the lung apex, into the fissure, and throughout the lung base. Several areas suggest some associated pleural thickening/empyema. Continued follow-up to resolution is recommended as an underlying mass/malignancy is difficult to fully exclude. 3. Some increasing scattered subcentimeter hypoattenuating mediastinal, hilar and left axillary adenopathy, may be reactive though continued attention on follow-up imaging is warranted pending exclusion of malignancy. These results were called by telephone at the time of interpretation on 12/31/2020 at 6:16 am to provider [facility] , who verbally acknowledged these results. Electronically Signed   By: Lovena Le M.D.   On: 12/31/2020 06:17   DG Chest Port 1 View  Result Date: 01/06/2021 CLINICAL DATA:  Follow-up left chest tube placement EXAM: PORTABLE CHEST 1 VIEW COMPARISON:  01/05/2021 FINDINGS:  Pigtail catheter is again noted over the left lung base. Persistent atelectatic changes are seen. No sizable effusion is noted. No pneumothorax is seen. Right lung remains clear. Cardiac shadow is stable. IMPRESSION: No significant recurrent pleural effusion is noted. Persistent left basilar atelectasis is seen. Electronically Signed   By: Inez Catalina M.D.   On: 01/06/2021 11:14   DG Chest Port 1 View  Result Date: 01/05/2021 CLINICAL DATA:  Chest tube placement for empyema EXAM: PORTABLE CHEST 1 VIEW COMPARISON:  Chest radiograph January 03, 2021 and chest CT January 04, 2021 FINDINGS: Pigtail catheter present on the left inferomedially, stable. Ill-defined areas of airspace opacity in the left lower lung region appear essentially stable. There are areas of questionable cavitation in this area, better seen on recent CT. There is ill-defined opacity in the left upper and mid lung regions, slightly less than on recent studies. Right lung is clear. Heart size and pulmonary vascularity are normal. No adenopathy. No bone lesions. IMPRESSION: Stable catheter position inferomedially on the left. No pneumothorax. Airspace consolidation with areas of questionable cavitation in this region, better delineated on recent CT. Slightly less opacity left upper and mid lung regions. No new opacities evident. Right lung clear. Stable cardiac silhouette. Electronically Signed   By: Lowella Grip III M.D.   On: 01/05/2021 09:57   DG CHEST PORT 1 VIEW  Result Date: 01/03/2021 CLINICAL DATA:  Chest pain. EXAM: PORTABLE CHEST 1 VIEW COMPARISON:  January 02, 2021. FINDINGS: Stable cardiomegaly. Right lung is clear. Stable position of left-sided chest tube is noted with grossly stable left pleural effusion and associated atelectasis or infiltrate. Bony thorax is unremarkable. No pneumothorax is noted. IMPRESSION: Stable position of left-sided chest tube with grossly stable left pleural effusion and associated atelectasis or  infiltrate. Electronically Signed   By: Marijo Conception M.D.   On: 01/03/2021 08:38   DG Chest Port 1 View  Result Date: 01/02/2021 CLINICAL DATA:  Shortness of breath, COVID-19 positive. EXAM: PORTABLE CHEST 1 VIEW COMPARISON:  January 01, 2021. FINDINGS: Stable cardiomegaly. No pneumothorax  is noted. Stable position of left-sided chest tube. Right lung is clear. Loculated left pleural effusion noted on prior exam is significantly smaller currently. Stable left basilar atelectasis or infiltrate is noted. Bony thorax is unremarkable. IMPRESSION: Stable position of left-sided chest tube. Loculated left pleural effusion noted on prior exam is significantly smaller currently. Stable left basilar atelectasis or infiltrate is noted. Electronically Signed   By: Marijo Conception M.D.   On: 01/02/2021 09:53   DG CHEST PORT 1 VIEW  Result Date: 01/01/2021 CLINICAL DATA:  Pleural effusion, chest tube, COVID pneumonia EXAM: PORTABLE CHEST 1 VIEW COMPARISON:  12/31/2020 FINDINGS: Large, partially laterally loculated left pleural effusion has enlarged in size. Left basilar pigtail chest tube positioned inferomedially is unchanged. Interval development of mild mediastinal shift to the right. Right lung is clear. No pneumothorax. No pleural effusion on the right. Cardiac size within normal limits. IMPRESSION: Left chest tube unchanged. Progressive enlargement of a partially loculated left pleural effusion with developing mediastinal shift to the right. CT examination may be helpful to assess catheter position in relation to the developing pleural fluid. Electronically Signed   By: Fidela Salisbury MD   On: 01/01/2021 08:15   DG CHEST PORT 1 VIEW  Result Date: 12/31/2020 CLINICAL DATA:  Loculated pleural effusion. EXAM: PORTABLE CHEST 1 VIEW COMPARISON:  CT 12/31/2020.  Chest x-ray 12/29/2020. FINDINGS: Left chest tube noted over the left lower chest. Prominent left pleural effusion with possible loculation. Underlying left  base atelectasis/infiltrate most likely present. No pneumothorax. Heart size stable. IMPRESSION: Left chest tube noted over the left lower chest. Prominent left pleural effusion with possible loculation. Underlying left base atelectasis/infiltrate most likely present. No pneumothorax. Electronically Signed   By: Marcello Moores  Register   On: 12/31/2020 11:43   VAS [place] LOWER EXTREMITY VENOUS (DVT)  Result Date: 01/03/2021  Lower Venous DVT Study Indications: Covid, elevated d-dimer.  Anticoagulation: Lovenox. Comparison Study: No prior studies. Performing Technologist: Darlin Coco RDMS  Examination Guidelines: A complete evaluation includes B-mode imaging, spectral Doppler, color Doppler, and power Doppler as needed of all accessible portions of each vessel. Bilateral testing is considered an integral part of a complete examination. Limited examinations for reoccurring indications may be performed as noted. The reflux portion of the exam is performed with the patient in reverse Trendelenburg.  +---------+---------------+---------+-----------+----------+--------------+ RIGHT    CompressibilityPhasicitySpontaneityPropertiesThrombus Aging +---------+---------------+---------+-----------+----------+--------------+ CFV      Full           Yes      Yes                                 +---------+---------------+---------+-----------+----------+--------------+ SFJ      Full                                                        +---------+---------------+---------+-----------+----------+--------------+ FV Prox  Full                                                        +---------+---------------+---------+-----------+----------+--------------+ FV Mid   Full                                                        +---------+---------------+---------+-----------+----------+--------------+  FV DistalFull                                                         +---------+---------------+---------+-----------+----------+--------------+ PFV      Full                                                        +---------+---------------+---------+-----------+----------+--------------+ POP      Full           Yes      Yes                                 +---------+---------------+---------+-----------+----------+--------------+ PTV      Full                                                        +---------+---------------+---------+-----------+----------+--------------+ PERO     Full                                                        +---------+---------------+---------+-----------+----------+--------------+   +---------+---------------+---------+-----------+----------+--------------+ LEFT     CompressibilityPhasicitySpontaneityPropertiesThrombus Aging +---------+---------------+---------+-----------+----------+--------------+ CFV      Full           Yes      Yes                                 +---------+---------------+---------+-----------+----------+--------------+ SFJ      Full                                                        +---------+---------------+---------+-----------+----------+--------------+ FV Prox  Full                                                        +---------+---------------+---------+-----------+----------+--------------+ FV Mid   Full                                                        +---------+---------------+---------+-----------+----------+--------------+ FV DistalFull                                                        +---------+---------------+---------+-----------+----------+--------------+  PFV      Full                                                        +---------+---------------+---------+-----------+----------+--------------+ POP      Full           Yes      Yes                                  +---------+---------------+---------+-----------+----------+--------------+ PTV      Full                                                        +---------+---------------+---------+-----------+----------+--------------+ PERO     Full                                                        +---------+---------------+---------+-----------+----------+--------------+     Summary: RIGHT: - There is no evidence of deep vein thrombosis in the lower extremity.  - No cystic structure found in the popliteal fossa.  LEFT: - There is no evidence of deep vein thrombosis in the lower extremity.  - No cystic structure found in the popliteal fossa.  *See table(s) above for measurements and observations. Electronically signed by Monica Martinez MD on 01/03/2021 at 2:28:49 PM.    Final

## 2021-01-07 NOTE — Progress Notes (Signed)
TPA was instilled again into left chest tube 2/9 and 210 cc drainage noted last 24 hours. We will obtain chest x-ray tomorrow and if drainage is decreased then okay to discontinue pigtail Can transition to oral antibiotics eventually, duration 14 to 21 days  Joseph Fitzgerald V. Elsworth Soho MD

## 2021-01-08 ENCOUNTER — Inpatient Hospital Stay (HOSPITAL_COMMUNITY): Payer: 59

## 2021-01-08 DIAGNOSIS — J869 Pyothorax without fistula: Principal | ICD-10-CM

## 2021-01-08 MED ORDER — OXYCODONE HCL 5 MG PO TABS: 5.0000 mg | ORAL_TABLET | ORAL | Status: DC | PRN

## 2021-01-08 MED ORDER — CEFDINIR 300 MG PO CAPS
300.0000 mg | ORAL_CAPSULE | Freq: Two times a day (BID) | ORAL | Status: DC
  Administered 2021-01-08 – 2021-01-09 (×3): 300 mg via ORAL
  Filled 2021-01-08 (×3): qty 1

## 2021-01-08 NOTE — Progress Notes (Signed)
PROGRESS NOTE                                                                                                                                                                                                             Patient Demographics:    Joseph Fitzgerald, is a 50 y.o. male, DOB - 1971-07-26, GHW:[id number]  Outpatient Primary MD for the patient is Ladell Pier, MD   Admit date - 12/30/2020   LOS - 8  No chief complaint on file.      Brief Narrative: Patient is a 50 y.o. male who has been followed in the pulmonology clinic for a left lower lung mass-presented to the ED on 2/3 with left-sided chest pain-found to have increasing left lower lobe mass and a loculated pleural effusion.  Evaluated by PCCM-chest tube placed-and subsequently admitted to the hospitalist service.  See below for further details.  COVID-19 vaccinated status: Partially vaccinated  Significant Events: 2/4>> Admit to [facility] for loculated pleural effusion/increasing left lower lobe mass.  Incidental COVID  Significant studies: 2/3>> CTA chest: No PE, complex/loculated pleural effusion-increased left lower lobe mass 2/6>> bilateral lower extremity Doppler: No DVT 2/7>> CT chest without contrast: Diffuse volume and loculated pleural effusion, persistent consolidation with necrosis in the left lower lobe.  Mediastinal lymphadenopathy.  COVID-19 medications: Remdesivir: 2/3>> 2/5  Antibiotics: Levofloxacin: 2/3>> 2/4 Rocephin: 2/4>> Flagyl: 2/4>>  Microbiology data: 2/3 >>blood culture: No growth 2/3>> Gold Quant front:ordered-but not performed-specimen tube underfilled 2/3>> pleural fluid AFB smear: Negative 2/3>> pleural fluid culture: Negative 2/3>> pleural fluid fungal culture>>negative 2/3>>pleural fluid/AFB culture: Pending  Procedures: 2/3>> chest tube placement by PCCM  Consults: PCCM  DVT prophylaxis: enoxaparin (LOVENOX)  injection 40 mg Start: 12/31/20 0845    Subjective:   Lying comfortably in bed no major issues.   Assessment  & Plan :   Left cavitary mass with complicated/loculated pleural effusion-likely empyema: Thought to be due to bacterial infection from poor dental hygiene.  Chest tube remains in place-treated with IV antibiotics-Daily fibrinolytic administration via chest tube-awaiting PCCM input today to see if chest tube can be removed.    Poor dentition  COVID-19 infection: Incidental-asymptomatic-Remdesivir x3 days completed on 2/5-apart from watchful observation no further treatment required at this time.  Needs isolation for a total of 10 days from 2/2.  COVID-19 Labs: No results  for input(s): DDIMER, FERRITIN, LDH, CRP in the last 72 hours.  No results found for: BNP  No results for input(s): PROCALCITON in the last 168 hours.  Lab Results  Component Value Date   SARSCOV2NAA POSITIVE (A) 12/30/2020   Pablo Pena Not Detected 10/01/2020   [place] NEGATIVE 09/08/2019     Obesity: Estimated body mass index is 33.15 kg/m as calculated from the following:   Height as of this encounter: 5' (1.524 m).   Weight as of this encounter: 77 kg.     ABG:    Component Value Date/Time   TCO2 21 (L) 03/17/2018 0342    Vent Settings: N/A    Condition - Stable  Family Communication  :    Code Status :  Full Code  Diet :  Diet Order            Diet regular Room service appropriate? Yes; Fluid consistency: Thin  Diet effective now                  Disposition Plan  :   Status is: Inpatient  Remains inpatient appropriate because:Inpatient level of care appropriate due to severity of illness   Dispo: The patient is from: Home              Anticipated d/c is to: Home              Anticipated d/c date is: > 3 days              Patient currently is not medically stable to d/c.   Difficult to place patient No   Barriers to discharge: Chest tube in place-on IV  antibiotics-May require bronchoscopy/CT surgery referral.   Antimicorbials  :    Anti-infectives (From admission, onward)   Start     Dose/Rate Route Frequency Ordered Stop   01/01/21 1145  cefTRIAXone (ROCEPHIN) 2 g in sodium chloride 0.9 % 100 mL IVPB        2 g 200 mL/hr over 30 Minutes Intravenous Every 24 hours 01/01/21 1050     01/01/21 1145  metroNIDAZOLE (FLAGYL) tablet 500 mg        500 mg Oral Every 8 hours 01/01/21 1050     01/01/21 1000  remdesivir 100 mg in sodium chloride 0.9 % 100 mL IVPB       "Followed by" Linked Group Details   100 mg 200 mL/hr over 30 Minutes Intravenous Daily 12/31/20 0841 01/02/21 1040   12/31/20 1000  levofloxacin (LEVAQUIN) tablet 750 mg  Status:  Discontinued        750 mg Oral Daily 12/31/20 0841 01/01/21 1050   12/31/20 1000  remdesivir 200 mg in sodium chloride 0.9% 250 mL IVPB       "Followed by" Linked Group Details   200 mg 580 mL/hr over 30 Minutes Intravenous Once 12/31/20 0841 12/31/20 1256   12/31/20 0630  levofloxacin (LEVAQUIN) IVPB 500 mg  Status:  Discontinued        500 mg 100 mL/hr over 60 Minutes Intravenous  Once 12/31/20 4081 12/31/20 0841      Inpatient Medications  Scheduled Meds: . vitamin C  500 mg Oral Daily  . atorvastatin  10 mg Oral Daily  . enoxaparin (LOVENOX) injection  40 mg Subcutaneous Q24H  . fluticasone  2 spray Each Nare Daily  . fluticasone furoate-vilanterol  1 puff Inhalation Daily  . metroNIDAZOLE  500 mg Oral Q8H  . sodium chloride flush  10 mL Intracatheter  Q8H  . zinc sulfate  220 mg Oral Daily   Continuous Infusions: . sodium chloride Stopped (01/07/21 1936)  . cefTRIAXone (ROCEPHIN)  IV 2 g (01/07/21 1204)   PRN Meds:.acetaminophen, albuterol, benzonatate, ibuprofen, morphine injection, ondansetron (ZOFRAN) IV, oxyCODONE   Time Spent in minutes  15  See all Orders from today for further details   Oren Binet M.D on 01/08/2021 at 11:59 AM  To page go to [email or web address] - use  universal password  [facility] -  Office  [phone]    Objective:   Vitals:   01/07/21 0930 01/07/21 1401 01/07/21 2100 01/08/21 0500  BP:   98/61 99/73  Pulse:  80 73 73  Resp:  16    Temp:  98.5 F (36.9 C) 98.8 F (37.1 C) 98.2 F (36.8 C)  TempSrc:  Oral Oral Oral  SpO2: 96% 95% 95% 97%  Weight:      Height:        Wt Readings from Last 3 Encounters:  01/02/21 77 kg  12/07/20 76.5 kg  11/13/20 76.7 kg     Intake/Output Summary (Last 24 hours) at 01/08/2021 1159 Last data filed at 01/08/2021 0845 Gross per 24 hour  Intake 500 ml  Output 10 ml  Net 490 ml     Physical Exam Gen Exam:Alert awake-not in any distress HEENT:atraumatic, normocephalic Chest: B/L clear to auscultation anteriorly CVS:S1S2 regular Abdomen:soft non tender, non distended Extremities:no edema Neurology: Non focal Skin: no rash   Data Review:    CBC Recent Labs  Lab 01/02/21 0401 01/03/21 0404 01/04/21 0345 01/05/21 0052 01/07/21 0756  WBC 20.0* 15.8* 10.1 10.5 8.2  HGB 11.8* 12.3* 12.4* 12.6* 13.4  HCT 36.3* 34.6* 36.5* 36.0* 39.1  PLT 319 324 340 367 396  MCV 89.0 87.6 89.2 87.0 88.7  MCH 28.9 31.1 30.3 30.4 30.4  MCHC 32.5 35.5 34.0 35.0 34.3  RDW 12.9 13.0 13.2 13.3 13.4  LYMPHSABS 1.3 1.1 1.3 1.0  --   MONOABS 1.7* 1.5* 1.1* 1.3*  --   EOSABS 0.0 0.1 0.1 0.1  --   BASOSABS 0.1 0.0 0.0 0.0  --     Chemistries  Recent Labs  Lab 01/02/21 0401 01/03/21 0404 01/04/21 0345 01/05/21 0052 01/07/21 0756  NA 134* 135 136 135 138  K 3.6 3.4* 3.3* 3.4* 3.8  CL 101 100 100 100 103  CO2 24 26 27 26 25   GLUCOSE 116* 114* 107* 124* 118*  BUN 9 10 8 10 7   CREATININE 0.88 0.75 0.84 0.72 0.67  CALCIUM 8.0* 7.8* 7.7* 7.8* 8.1*  MG 2.0 1.9 2.2 2.0  --   AST 15 18 20 22   --   ALT 17 16 16 16   --   ALKPHOS 64 68 59 56  --   BILITOT 0.7 0.5 0.7 0.5  --     ------------------------------------------------------------------------------------------------------------------ No results for input(s): CHOL, HDL, LDLCALC, TRIG, CHOLHDL, LDLDIRECT in the last 72 hours.  Lab Results  Component Value Date   HGBA1C 5.4 10/17/2019   ------------------------------------------------------------------------------------------------------------------ No results for input(s): TSH, T4TOTAL, T3FREE, THYROIDAB in the last 72 hours.  Invalid input(s): FREET3 ------------------------------------------------------------------------------------------------------------------ No results for input(s): VITAMINB12, FOLATE, FERRITIN, TIBC, IRON, RETICCTPCT in the last 72 hours.  Coagulation profile No results for input(s): INR, PROTIME in the last 168 hours.  No results for input(s): DDIMER in the last 72 hours.  Cardiac Enzymes No results for input(s): CKMB, TROPONINI, MYOGLOBIN in the last 168 hours.  Invalid input(s): CK ------------------------------------------------------------------------------------------------------------------ No  results found for: BNP  Micro Results Recent Results (from the past 240 hour(s))  SARS Coronavirus 2 by RT PCR (hospital order, performed in [facility] hospital lab) Nasopharyngeal Nasopharyngeal Swab     Status: Abnormal   Collection Time: 12/30/20 10:50 PM   Specimen: Nasopharyngeal Swab  Result Value Ref Range Status   SARS Coronavirus 2 POSITIVE (A) NEGATIVE Final    Comment: RESULT CALLED TO, READ BACK BY AND VERIFIED WITH: C CRISCO RN 12/31/20 0044 JDW (NOTE) SARS-CoV-2 target nucleic acids are DETECTED  SARS-CoV-2 RNA is generally detectable in upper respiratory specimens  during the acute phase of infection.  Positive results are indicative  of the presence of the identified virus, but do not rule out bacterial infection or co-infection with other pathogens not detected by the test.  Clinical correlation with  patient history and  other diagnostic information is necessary to determine patient infection status.  The expected result is negative.  Fact Sheet for Patients:   StrictlyIdeas.no   Fact Sheet for Healthcare Providers:   BankingDealers.co.za    This test is not yet approved or cleared by the [place] FDA and  has been authorized for detection and/or diagnosis of SARS-CoV-2 by FDA under an Emergency Use Authorization (EUA).  This EUA will remain in effect (meaning this test can  be used) for the duration of  the COVID-19 declaration under Section 564(b)(1) of the Act, 21 U.S.C. section 360-bbb-3(b)(1), unless the authorization is terminated or revoked sooner.  Performed at [facility], [place] [address] [place], [place] [address]   Culture, blood (routine x 2) Call MD if unable to obtain prior to antibiotics being given     Status: None   Collection Time: 12/31/20  8:42 AM   Specimen: BLOOD  Result Value Ref Range Status   Specimen Description BLOOD LEFT ANTECUBITAL  Final   Special Requests   Final    BOTTLES DRAWN AEROBIC AND ANAEROBIC Blood Culture results may not be optimal due to an inadequate volume of blood received in culture bottles   Culture   Final    NO GROWTH 5 DAYS Performed at [place] [address], [place], [place] [address]    Report Status 01/05/2021 FINAL  Final  Culture, blood (routine x 2) Call MD if unable to obtain prior to antibiotics being given     Status: None   Collection Time: 12/31/20  9:14 AM   Specimen: BLOOD  Result Value Ref Range Status   Specimen Description BLOOD RIGHT ANTECUBITAL  Final   Special Requests   Final    BOTTLES DRAWN AEROBIC AND ANAEROBIC Blood Culture adequate volume   Culture   Final    NO GROWTH 5 DAYS Performed at [facility], [place] [address], [place], [place] [address]    Report Status 01/05/2021 FINAL  Final  Body fluid culture (includes gram  stain)     Status: None   Collection Time: 12/31/20 11:18 AM   Specimen: Pleural Fluid  Result Value Ref Range Status   Specimen Description PLEURAL FLUID  Final   Special Requests NONE  Final   Gram Stain   Final    ABUNDANT WBC PRESENT,BOTH PMN AND MONONUCLEAR NO ORGANISMS SEEN    Culture   Final    NO GROWTH Performed at [facility], [place] [address] [place],  [address]    Report Status 01/03/2021 FINAL  Final  Fungus Culture With Stain  Status: None (Preliminary result)   Collection Time: 12/31/20 11:18 AM   Specimen: Pleural Fluid  Result Value Ref Range Status   Fungus Stain Final report  Final    Comment: (NOTE) Performed At: [facility] [place], [place] [id number] Rush Farmer MD [id number]    Fungus (Mycology) Culture PENDING  Incomplete   Fungal Source PLEURAL  Final    Comment: FLUID Performed at [facility], [place] [address] [place], [place] [address]   Acid Fast Smear (AFB)     Status: None   Collection Time: 12/31/20 11:18 AM   Specimen: Pleural; Respiratory  Result Value Ref Range Status   AFB Specimen Processing Concentration  Final   Acid Fast Smear Negative  Final    Comment: (NOTE) Performed At: [facility] [place], [place] [id number] Rush Farmer MD [id number]    Source (AFB) PLEURAL  Final    Comment: FLUID Performed at [facility], [place] [address], [place], [place] [address]   Fungus Culture Result     Status: None   Collection Time: 12/31/20 11:18 AM  Result Value Ref Range Status   Result 1 Comment  Final    Comment: (NOTE) KOH/Calcofluor preparation:  no fungus observed. Performed At: [facility] [place], [place] [id number] Rush Farmer MD [id number]     Radiology Reports DG Chest 2 View  Result Date: 12/29/2020 CLINICAL DATA:  Cough. Increasing left lower rib pain. Mass 2 left lower lung. EXAM: CHEST - 2 VIEW  COMPARISON:  Radiograph December 13 21.  CT chest 12/01/2020. FINDINGS: Persistent masslike opacity in the left lower lobe, which has increased since 11/09/2020 radiograph. Direct comparison with the 12/01/2020 CT chest is difficult given differences in modality. New small left pleural effusion. No visible pneumothorax. Cardiomediastinal silhouette is within normal limits. No evidence of acute osseous abnormality. IMPRESSION: Persistent masslike opacity in the left lower lobe with new small left pleural effusion. The opacity is increased relative to prior radiograph from 11/09/2020. Comparison with recent CT chest is limited given differences in modality. Differential considerations remain pneumonia versus malignancy and recommend follow-up chest CT as directed from the 12/01/2020 CT chest. Electronically Signed   By: Margaretha Sheffield MD   On: 12/29/2020 13:22   CT CHEST WO CONTRAST  Result Date: 01/04/2021 CLINICAL DATA:  50 year old with loculated left pleural effusion and chest tube. Patient has undergone pleural fibrinolytic therapy. EXAM: CT CHEST WITHOUT CONTRAST TECHNIQUE: Multidetector CT imaging of the chest was performed following the standard protocol without IV contrast. COMPARISON:  Chest CT 12/31/2020 and chest radiograph 01/03/2021 and chest CT 12/01/2020 FINDINGS: Cardiovascular: Normal caliber of the thoracic aorta without atherosclerotic calcifications. Small pericardial effusion which appears to be slightly increased. Heart size is normal. Mediastinum/Nodes: Prominent subcarinal tissue measuring up to 1.8 cm in short axis on sequence 3, image 27 and similar to the recent comparison examination. However, subcarinal tissue only measured 1.1 cm in the short axis on 12/01/2020. Prominent right paratracheal lymph node measuring 0.9 cm in the short axis on sequence 3, image 20. Again noted are multiple small left axillary lymph nodes. Lungs/Pleura: Left posterior pigtail drain in the pleural space.  The drain was placed along the medial aspect of the left lower chest. Overall, the volume of pleural fluid has decreased, particularly in the left lower chest. There is a small amount of residual pleural thickening or pleural fluid in the left lower chest. Residual loculated pleural fluid  in the lingular region. Multiple pockets of pleural air. Residual small pockets of loculated pleural fluid near the superior segment of the left lower lobe. Persistent area of consolidation in the left lower lobe with evidence of central necrosis on sequence 6, image 81. This area roughly measures 4.8 x 4.9 cm. This area roughly measured 5.5 x 3.5 cm on 12/01/2020. Patchy densities throughout the left lower lung compatible with areas of consolidation and volume loss. Right lung is clear. Negative for a right pleural effusion. Upper Abdomen: Images of the upper abdomen are unremarkable. Musculoskeletal: No acute bone abnormality. IMPRESSION: 1. Loculated left pleural effusion has decreased in volume. There continues to be scattered areas of loculated left pleural fluid with pockets of pleural air. Chest tube is positioned in the medial posterior left chest. 2. Persistent consolidation with necrosis in the left lower lobe. This area has persisted since 12/01/2020. Findings are concerning for necrotic pneumonia but an underlying malignancy can not be excluded. 3. Mediastinal lymphadenopathy. This lymphadenopathy has not significantly changed since the recent comparison examination but has increased since 12/01/2020. This lymphadenopathy could be reactive but indeterminate. 4. Small but slightly increased pericardial effusion. Electronically Signed   By: Markus Daft M.D.   On: 01/04/2021 08:30   CT ANGIO CHEST PE W OR WO CONTRAST  Result Date: 12/31/2020 CLINICAL DATA:  Shortness of breath EXAM: CT ANGIOGRAPHY CHEST WITH CONTRAST TECHNIQUE: Multidetector CT imaging of the chest was performed using the standard protocol during bolus  administration of intravenous contrast. Multiplanar CT image reconstructions and MIPs were obtained to evaluate the vascular anatomy. CONTRAST:  174m OMNIPAQUE IOHEXOL 350 MG/ML SOLN COMPARISON:  CT 12/01/2020, 10/14/2020 FINDINGS: Cardiovascular: Satisfactory opacification the pulmonary arteries to the segmental level. No pulmonary artery filling defects are identified. Central pulmonary arteries are top-normal caliber. Cardiac size is top normal. No sizable pericardial effusion. There is however some inflammation in the adjacent paracardial/mediastinal fat which is likely secondary to the it extensive process in the left lung. The aorta is normal caliber. No acute luminal abnormality of the imaged aorta. No periaortic stranding or hemorrhage. Normal 3 vessel branching of the aortic arch. Proximal great vessels are unremarkable. No major venous abnormalities are seen. Mediastinum/Nodes: Stranding and thickening in the left anterior mediastinal/paracardial fat. No mediastinal free fluid or air. Normal thyroid gland and thoracic inlet. No acute abnormality of the trachea or esophagus. Scattered low-attenuation subcentimeter mediastinal and hilar adenopathy. Several larger subcarinal and left hilar nodes are present measuring up to, 1.5 cm and 1.2 cm respectively (7/189), quite similar to prior. There is some increasing left axillary adenopathy is present as well with some mild surrounding stranding which could reflect a reactive process. Lungs/Pleura: Increasing consolidative opacity centered in the left lower with hypoattenuation of the consolidated parenchyma suggestive of some underlying infection with scattered air lucencies, possibly developing cavitation (6/77). Development of a complex, loculated pleural effusion extending over the lung apex, into the fissures, and throughout the lung base. Suspect some reactive stranding of the adjacent mediastinal fat as detailed above. Several areas suggest some associated  pleural thickening which could reflect a developing empyema. Right lung remains essentially clear without visible right pleural effusion. No pneumothorax. Upper Abdomen: No acute abnormalities present in the visualized portions of the upper abdomen. Musculoskeletal: No acute osseous abnormality or suspicious osseous lesion. Multilevel degenerative changes are present in the imaged portions of the spine. No clear involvement of the chest wall. No erosive or destructive changes of the adjacent ribs. No worrisome  chest wall masses or lesions. Review of the MIP images confirms the above findings. IMPRESSION: 1. No pulmonary embolism is seen. 2. Increasing consolidative opacity centered in the left lower with hypoattenuation of the consolidated parenchyma suggestive of some underlying infection with scattered air lucencies, possibly developing cavitation or necrosis. Additional development of a complex, loculated pleural effusion extending over the lung apex, into the fissure, and throughout the lung base. Several areas suggest some associated pleural thickening/empyema. Continued follow-up to resolution is recommended as an underlying mass/malignancy is difficult to fully exclude. 3. Some increasing scattered subcentimeter hypoattenuating mediastinal, hilar and left axillary adenopathy, may be reactive though continued attention on follow-up imaging is warranted pending exclusion of malignancy. These results were called by telephone at the time of interpretation on 12/31/2020 at 6:16 am to provider [facility] , who verbally acknowledged these results. Electronically Signed   By: Lovena Le M.D.   On: 12/31/2020 06:17   DG Chest Port 1 View  Result Date: 01/08/2021 CLINICAL DATA:  50 year old male with loculated left pleural effusion, pleural fibrinolytic therapy. EXAM: PORTABLE CHEST 1 VIEW COMPARISON:  Portable chest 01/06/2021 and earlier. FINDINGS: Portable AP semi upright view at 0446 hours. Left chest tube  remains stable. Larger lung volumes. Patchy and confluent left lung base opacity appears stable since the CT on 01/04/2021 (please see that report) no pneumothorax identified. Mediastinal contour remains normal. Right lung remains negative. Negative bowel gas. Stable visualized osseous structures. IMPRESSION: Stable since the CT on 01/04/2021 (please see that report). No new cardiopulmonary abnormality. Electronically Signed   By: Genevie Ann M.D.   On: 01/08/2021 07:19   DG Chest Port 1 View  Result Date: 01/06/2021 CLINICAL DATA:  Follow-up left chest tube placement EXAM: PORTABLE CHEST 1 VIEW COMPARISON:  01/05/2021 FINDINGS: Pigtail catheter is again noted over the left lung base. Persistent atelectatic changes are seen. No sizable effusion is noted. No pneumothorax is seen. Right lung remains clear. Cardiac shadow is stable. IMPRESSION: No significant recurrent pleural effusion is noted. Persistent left basilar atelectasis is seen. Electronically Signed   By: Inez Catalina M.D.   On: 01/06/2021 11:14   DG Chest Port 1 View  Result Date: 01/05/2021 CLINICAL DATA:  Chest tube placement for empyema EXAM: PORTABLE CHEST 1 VIEW COMPARISON:  Chest radiograph January 03, 2021 and chest CT January 04, 2021 FINDINGS: Pigtail catheter present on the left inferomedially, stable. Ill-defined areas of airspace opacity in the left lower lung region appear essentially stable. There are areas of questionable cavitation in this area, better seen on recent CT. There is ill-defined opacity in the left upper and mid lung regions, slightly less than on recent studies. Right lung is clear. Heart size and pulmonary vascularity are normal. No adenopathy. No bone lesions. IMPRESSION: Stable catheter position inferomedially on the left. No pneumothorax. Airspace consolidation with areas of questionable cavitation in this region, better delineated on recent CT. Slightly less opacity left upper and mid lung regions. No new opacities  evident. Right lung clear. Stable cardiac silhouette. Electronically Signed   By: Lowella Grip III M.D.   On: 01/05/2021 09:57   DG CHEST PORT 1 VIEW  Result Date: 01/03/2021 CLINICAL DATA:  Chest pain. EXAM: PORTABLE CHEST 1 VIEW COMPARISON:  January 02, 2021. FINDINGS: Stable cardiomegaly. Right lung is clear. Stable position of left-sided chest tube is noted with grossly stable left pleural effusion and associated atelectasis or infiltrate. Bony thorax is unremarkable. No pneumothorax is noted. IMPRESSION: Stable position of left-sided  chest tube with grossly stable left pleural effusion and associated atelectasis or infiltrate. Electronically Signed   By: Marijo Conception M.D.   On: 01/03/2021 08:38   DG Chest Port 1 View  Result Date: 01/02/2021 CLINICAL DATA:  Shortness of breath, COVID-19 positive. EXAM: PORTABLE CHEST 1 VIEW COMPARISON:  January 01, 2021. FINDINGS: Stable cardiomegaly. No pneumothorax is noted. Stable position of left-sided chest tube. Right lung is clear. Loculated left pleural effusion noted on prior exam is significantly smaller currently. Stable left basilar atelectasis or infiltrate is noted. Bony thorax is unremarkable. IMPRESSION: Stable position of left-sided chest tube. Loculated left pleural effusion noted on prior exam is significantly smaller currently. Stable left basilar atelectasis or infiltrate is noted. Electronically Signed   By: Marijo Conception M.D.   On: 01/02/2021 09:53   DG CHEST PORT 1 VIEW  Result Date: 01/01/2021 CLINICAL DATA:  Pleural effusion, chest tube, COVID pneumonia EXAM: PORTABLE CHEST 1 VIEW COMPARISON:  12/31/2020 FINDINGS: Large, partially laterally loculated left pleural effusion has enlarged in size. Left basilar pigtail chest tube positioned inferomedially is unchanged. Interval development of mild mediastinal shift to the right. Right lung is clear. No pneumothorax. No pleural effusion on the right. Cardiac size within normal limits.  IMPRESSION: Left chest tube unchanged. Progressive enlargement of a partially loculated left pleural effusion with developing mediastinal shift to the right. CT examination may be helpful to assess catheter position in relation to the developing pleural fluid. Electronically Signed   By: Fidela Salisbury MD   On: 01/01/2021 08:15   DG CHEST PORT 1 VIEW  Result Date: 12/31/2020 CLINICAL DATA:  Loculated pleural effusion. EXAM: PORTABLE CHEST 1 VIEW COMPARISON:  CT 12/31/2020.  Chest x-ray 12/29/2020. FINDINGS: Left chest tube noted over the left lower chest. Prominent left pleural effusion with possible loculation. Underlying left base atelectasis/infiltrate most likely present. No pneumothorax. Heart size stable. IMPRESSION: Left chest tube noted over the left lower chest. Prominent left pleural effusion with possible loculation. Underlying left base atelectasis/infiltrate most likely present. No pneumothorax. Electronically Signed   By: Marcello Moores  Register   On: 12/31/2020 11:43   VAS [place] LOWER EXTREMITY VENOUS (DVT)  Result Date: 01/03/2021  Lower Venous DVT Study Indications: Covid, elevated d-dimer.  Anticoagulation: Lovenox. Comparison Study: No prior studies. Performing Technologist: Darlin Coco RDMS  Examination Guidelines: A complete evaluation includes B-mode imaging, spectral Doppler, color Doppler, and power Doppler as needed of all accessible portions of each vessel. Bilateral testing is considered an integral part of a complete examination. Limited examinations for reoccurring indications may be performed as noted. The reflux portion of the exam is performed with the patient in reverse Trendelenburg.  +---------+---------------+---------+-----------+----------+--------------+ RIGHT    CompressibilityPhasicitySpontaneityPropertiesThrombus Aging +---------+---------------+---------+-----------+----------+--------------+ CFV      Full           Yes      Yes                                  +---------+---------------+---------+-----------+----------+--------------+ SFJ      Full                                                        +---------+---------------+---------+-----------+----------+--------------+ FV Prox  Full                                                        +---------+---------------+---------+-----------+----------+--------------+  FV Mid   Full                                                        +---------+---------------+---------+-----------+----------+--------------+ FV DistalFull                                                        +---------+---------------+---------+-----------+----------+--------------+ PFV      Full                                                        +---------+---------------+---------+-----------+----------+--------------+ POP      Full           Yes      Yes                                 +---------+---------------+---------+-----------+----------+--------------+ PTV      Full                                                        +---------+---------------+---------+-----------+----------+--------------+ PERO     Full                                                        +---------+---------------+---------+-----------+----------+--------------+   +---------+---------------+---------+-----------+----------+--------------+ LEFT     CompressibilityPhasicitySpontaneityPropertiesThrombus Aging +---------+---------------+---------+-----------+----------+--------------+ CFV      Full           Yes      Yes                                 +---------+---------------+---------+-----------+----------+--------------+ SFJ      Full                                                        +---------+---------------+---------+-----------+----------+--------------+ FV Prox  Full                                                         +---------+---------------+---------+-----------+----------+--------------+ FV Mid   Full                                                        +---------+---------------+---------+-----------+----------+--------------+  FV DistalFull                                                        +---------+---------------+---------+-----------+----------+--------------+ PFV      Full                                                        +---------+---------------+---------+-----------+----------+--------------+ POP      Full           Yes      Yes                                 +---------+---------------+---------+-----------+----------+--------------+ PTV      Full                                                        +---------+---------------+---------+-----------+----------+--------------+ PERO     Full                                                        +---------+---------------+---------+-----------+----------+--------------+     Summary: RIGHT: - There is no evidence of deep vein thrombosis in the lower extremity.  - No cystic structure found in the popliteal fossa.  LEFT: - There is no evidence of deep vein thrombosis in the lower extremity.  - No cystic structure found in the popliteal fossa.  *See table(s) above for measurements and observations. Electronically signed by Monica Martinez MD on 01/03/2021 at 2:28:49 PM.    Final

## 2021-01-08 NOTE — Progress Notes (Signed)
NAME:  Joseph Fitzgerald, MRN:  [id number], DOB:  Dec 14, 1970, LOS: 8 ADMISSION DATE:  12/30/2020, CONSULTATION DATE:  2/3 REFERRING MD:  Lorin Mercy, CHIEF COMPLAINT:  Chest pain   Brief History:  50 y/o male who has been followed in the pulmonology clinic for a left lower lobe mass felt to be of infectious etiology presented to the [facility] emergency department on January 01, 2020 complaining of left chest pain.  Noted to have an increased size of the mass with an associated pleural effusion.  History of Present Illness:  This is a 50 year old male with minimal past medical history who was initially seen in the pulmonology clinic in late 2021 after he developed left-sided chest pain and was seen in urgent care.  He had a chest x-ray which showed a mass with likely cavitation.  He was treated with oral antibiotics for over a month and in January 2022 he was noted to have decreasing size of the mass and symptoms had improved.  Of note, the patient has very poor dentition, previously had no dental insurance and had pulled most of his teeth by himself when he had dental problems.  However on December 29, 2020 he presented to urgent care with a complaint of left-sided chest pain again.  He was treated with antibiotics, chest x-ray showed increasing size of the mass and he was referred back to pulmonology.  Plans are being made for bronchoscopic evaluation.  He returned to the [facility] emergency department on December 31, 2020 complaining of left-sided chest pain, worsening dyspnea, noted to be febrile.  The dyspnea and chest pain started on Sunday January 30.  A CT angiogram chest was performed which showed increasing size of the necrotic mass and a moderate size loculated pleural effusion.  Cigarette smoking> noted in his history but he denies when asked  He was found to be COVID positive.  He states that he had his first vaccine on 1/15, doesn't know the brand.  He has had symptoms of headache and mild body aches.     Past Medical History:  Smoker? Poor dentition Mass of lower lobe of left lung Marijuana abuse Asthma Aortic atherosclerosis  Significant Hospital Events:  2/3 admit 2/7 Repeat CT with persistent   Consults:  PCCM  Procedures:    Significant Diagnostic Tests:  2/3 CT Angiogram chest > right lung without evidence of pulmonary parenchymal abnormality, left lung with large left lower lobe mass, and cavitation, partially loculated anterior fissure effusion in 2 separate locations: 1 in the left base and a separate in the superior thorax, bulky adenopathy noted in the subcarinal, left pretracheal, and left hilar areas.   2/7 CT chest>> 1. Loculated left pleural effusion has decreased in volume. There continues to be scattered areas of loculated left pleural fluid with pockets of pleural air. Chest tube is positioned in the medial posterior left chest. 2. Persistent consolidation with necrosis in the left lower lobe. This area has persisted since 12/01/2020. Findings are concerning for necrotic pneumonia but an underlying malignancy can not be excluded. 3. Mediastinal lymphadenopathy. This lymphadenopathy has not significantly changed since the recent comparison examination but has increased since 12/01/2020. This lymphadenopathy could be reactive but indeterminate. 4. Small but slightly increased pericardial effusion.  Micro Data:  2/3 sars cov 2 > positive 2/3 blood >no growth 2/3 fungal culture>>neg 2/3 body fluid culture>>no growth 2/3 acid fast culture>>  Antimicrobials:  2/3 levaquin > 2/4 2/4 ceftriaxone> 2/4 Flagyl> 2/3 remdesivir >  Interim History /  Subjective:   Feels better. Limited output from chest tube drain. CXR stable   Objective   Blood pressure 99/73, pulse 73, temperature 98.2 F (36.8 C), temperature source Oral, resp. rate 16, height 5' (1.524 m), weight 77 kg, SpO2 97 %.        Intake/Output Summary (Last 24 hours) at 01/08/2021  1142 Last data filed at 01/07/2021 2100 Gross per 24 hour  Intake 260 ml  Output 10 ml  Net 250 ml   Filed Weights   12/31/20 1530 01/02/21 0402  Weight: 76.5 kg 77 kg   General up in room no distress HENT NCAT no JVD pulm clear no sig output from chest tube-->only 10cc output  Card rrr abd soft  Ext warm and dry Neuro intact   Resolved Hospital Problem list     Assessment & Plan:   Cavitary pneumonia with associate pleural effusion in a male with poor dentition: Favor infectious etiology, now with likely empyema though malignancy is possible.  Pleural fluid studies consistent with inflammatory process. Poor dentition COVID-19 infection: Incidental, remdesivir x3 days.  -last dose TPA/Dornase given 2/10, has had really minimal pleural output over the last 24 hrs -CT surgery reviewed case and felt that no surgical intervention indicated per Dr. Servando Snare -pcxr personally reviewed: CT good position. Left basilar airspace disease improved over serial films from 2/10   Plan Dc chest tube (done) Complete 14-21 days total abx; will change his ceftriaxone to cefdinir  Will set him up for f/u with [place] in 2-3 weeks  Best practice (evaluated daily)   Per [facility]  Goals of Care:   Erick Colace ACNP-BC [place] Pager # [phone] OR # [phone] if no answer

## 2021-01-09 ENCOUNTER — Inpatient Hospital Stay (HOSPITAL_COMMUNITY): Payer: 59

## 2021-01-09 MED ORDER — BENZONATATE 200 MG PO CAPS
200.0000 mg | ORAL_CAPSULE | Freq: Three times a day (TID) | ORAL | 0 refills | Status: AC | PRN
Start: 2021-01-09 — End: 2021-01-16

## 2021-01-09 MED ORDER — CEFDINIR 300 MG PO CAPS: 300.0000 mg | ORAL_CAPSULE | Freq: Two times a day (BID) | ORAL | 0 refills | Status: AC

## 2021-01-09 NOTE — Plan of Care (Signed)
  Problem: Education: Goal: Knowledge of General Education information will improve Description: Including pain rating scale, medication(s)/side effects and non-pharmacologic comfort measures Outcome: Progressing   Problem: Clinical Measurements: Goal: Ability to maintain clinical measurements within normal limits will improve Outcome: Progressing   Problem: Activity: Goal: Risk for activity intolerance will decrease Outcome: Progressing   Problem: Nutrition: Goal: Adequate nutrition will be maintained Outcome: Progressing   Problem: Coping: Goal: Level of anxiety will decrease Outcome: Progressing   Problem: Elimination: Goal: Will not experience complications related to bowel motility Outcome: Progressing   Problem: Pain Managment: Goal: General experience of comfort will improve Outcome: Progressing

## 2021-01-09 NOTE — Discharge Summary (Signed)
PATIENT DETAILS Name: Joseph Fitzgerald Age: 50 y.o. Sex: male Date of Birth: [birth year] MRN: [id number]. Admitting Physician: Karmen Bongo, MD BTD:[id number], Joseph Batman, MD  Admit Date: 12/30/2020 Discharge date: 01/09/2021  Recommendations for Outpatient Follow-up:  1. Follow up with PCP in 1-2 weeks 2. Please obtain CMP/CBC in one week 3. Ensure follow-up with pulmonology 4. AFB cultures from pleural fluid pending-please follow  Admitted From:  Home  Disposition: [place]: No  Equipment/Devices: None  Discharge Condition: Stable  CODE STATUS: FULL CODE  Diet recommendation:  Diet Order            Diet - low sodium heart healthy           Diet regular Room service appropriate? Yes; Fluid consistency: Thin  Diet effective now                  Brief Summary: Brief Narrative: Patient is a 50 y.o. male who has been followed in the pulmonology clinic for a left lower lung mass-presented to the ED on 2/3 with left-sided chest pain-found to have increasing left lower lobe mass and a loculated pleural effusion.  Evaluated by PCCM-chest tube placed-and subsequently admitted to the hospitalist service.  See below for further details.  COVID-19 vaccinated status: Partially vaccinated  Significant Events: 2/4>> Admit to [facility] for loculated pleural effusion/increasing left lower lobe mass.  Incidental COVID  Significant studies: 2/3>> CTA chest: No PE, complex/loculated pleural effusion-increased left lower lobe mass 2/6>> bilateral lower extremity Doppler: No DVT 2/7>> CT chest without contrast: Diffuse volume and loculated pleural effusion, persistent consolidation with necrosis in the left lower lobe.  Mediastinal lymphadenopathy.  COVID-19 medications: Remdesivir: 2/3>> 2/5  Antibiotics: Levofloxacin: 2/3>> 2/4 Rocephin: 2/4>> Flagyl: 2/4>>  Microbiology data: 2/3 >>blood culture: No growth 2/3>> Gold Quant front:ordered-but not  performed-specimen tube underfilled 2/3>> pleural fluid cytology: Negative for malignancy-inflammation present. 2/3>> pleural fluid AFB smear: Negative 2/3>> pleural fluid culture: Negative 2/3>> pleural fluid fungal culture>>negative 2/3>>pleural fluid/AFB culture: Pending  Procedures: 2/3>> 2/11chest tube(PCCM)  Consults: PCCM  Brief Hospital Course: Left cavitary mass with complicated/loculated pleural effusion-likely empyema: Thought to be due to bacterial infection from poor dental hygiene.    Treated with empiric Rocephin/Flagyl-PCCM consulted-chest tube placed-underwent daily fibrinolytic therapy.  PCCM discussed with CTVS-not a candidate for VATS procedure.  Chest tube removed on 2/11-recommendations from PCCM are to continue [place] for 3 additional weeks.  PCCM will arrange for outpatient follow-up-and repeat imaging.  Stable for discharge today  Poor dentition  COVID-19 infection: Incidental-asymptomatic-Remdesivir x3 days completed on 2/5-apart from watchful observation no further treatment required at this time.  Needs isolation for a total of 10 days from 2/2.  COVID-19 Labs:  No results for input(s): DDIMER, FERRITIN, LDH, CRP in the last 72 hours.  Lab Results  Component Value Date   SARSCOV2NAA POSITIVE (A) 12/30/2020   [place] Not Detected 10/01/2020   [place] NEGATIVE 09/08/2019    Obesity: Estimated body mass index is 33.15 kg/m as calculated from the following:   Height as of this encounter: 5' (1.524 m).   Weight as of this encounter: 77 kg.    Discharge Diagnoses:  Principal Problem:   Loculated pleural effusion Active Problems:   Asthma, moderate persistent   Hyperglycemia   COVID-19 virus infection   Poor dentition   Aortic atherosclerosis (HCC)   Mass of lower lobe of left lung   Marijuana abuse   Tobacco abuse   Chest tube in place  Discharge Instructions:    Person Under Monitoring Name: Joseph Fitzgerald  Location: [address] [place] [address]   Infection Prevention Recommendations for Individuals Confirmed to have, or Being Evaluated for, 2019 Novel Coronavirus (COVID-19) Infection Who Receive Care at Home  Individuals who are confirmed to have, or are being evaluated for, COVID-19 should follow the prevention steps below until a healthcare provider or local or state health department says they can return to normal activities.  Stay home except to get medical care You should restrict activities outside your home, except for getting medical care. Do not go to work, school, or public areas, and do not use public transportation or taxis.  Call ahead before visiting your doctor Before your medical appointment, call the healthcare provider and tell them that you have, or are being evaluated for, COVID-19 infection. This will help the healthcare provider's office take steps to keep other people from getting infected. Ask your healthcare provider to call the local or state health department.  Monitor your symptoms Seek prompt medical attention if your illness is worsening (e.g., difficulty breathing). Before going to your medical appointment, call the healthcare provider and tell them that you have, or are being evaluated for, COVID-19 infection. Ask your healthcare provider to call the local or state health department.  Wear a facemask You should wear a facemask that covers your nose and mouth when you are in the same room with other people and when you visit a healthcare provider. People who live with or visit you should also wear a facemask while they are in the same room with you.  Separate yourself from other people in your home As much as possible, you should stay in a different room from other people in your home. Also, you should use a separate bathroom, if available.  Avoid sharing household items You should not share dishes, drinking glasses, cups, eating utensils, towels,  bedding, or other items with other people in your home. After using these items, you should wash them thoroughly with soap and water.  Cover your coughs and sneezes Cover your mouth and nose with a tissue when you cough or sneeze, or you can cough or sneeze into your sleeve. Throw used tissues in a lined trash can, and immediately wash your hands with soap and water for at least 20 seconds or use an alcohol-based hand rub.  Wash your [facility] your hands often and thoroughly with soap and water for at least 20 seconds. You can use an alcohol-based hand sanitizer if soap and water are not available and if your hands are not visibly dirty. Avoid touching your eyes, nose, and mouth with unwashed hands.   Prevention Steps for Caregivers and Household Members of Individuals Confirmed to have, or Being Evaluated for, COVID-19 Infection Being Cared for in the Home  If you live with, or provide care at home for, a person confirmed to have, or being evaluated for, COVID-19 infection please follow these guidelines to prevent infection:  Follow healthcare provider's instructions Make sure that you understand and can help the patient follow any healthcare provider instructions for all care.  Provide for the patient's basic needs You should help the patient with basic needs in the home and provide support for getting groceries, prescriptions, and other personal needs.  Monitor the patient's symptoms If they are getting sicker, call his or her medical provider and tell them that the patient has, or is being evaluated for, COVID-19 infection. This will  help the healthcare provider's office take steps to keep other people from getting infected. Ask the healthcare provider to call the local or state health department.  Limit the number of people who have contact with the patient  If possible, have only one caregiver for the patient.  Other household members should stay in another home or place of  residence. If this is not possible, they should stay  in another room, or be separated from the patient as much as possible. Use a separate bathroom, if available.  Restrict visitors who do not have an essential need to be in the home.  Keep older adults, very young children, and other sick people away from the patient Keep older adults, very young children, and those who have compromised immune systems or chronic health conditions away from the patient. This includes people with chronic heart, lung, or kidney conditions, diabetes, and cancer.  Ensure good ventilation Make sure that shared spaces in the home have good air flow, such as from an air conditioner or an opened window, weather permitting.  Wash your hands often  Wash your hands often and thoroughly with soap and water for at least 20 seconds. You can use an alcohol based hand sanitizer if soap and water are not available and if your hands are not visibly dirty.  Avoid touching your eyes, nose, and mouth with unwashed hands.  Use disposable paper towels to dry your hands. If not available, use dedicated cloth towels and replace them when they become wet.  Wear a facemask and gloves  Wear a disposable facemask at all times in the room and gloves when you touch or have contact with the patient's blood, body fluids, and/or secretions or excretions, such as sweat, saliva, sputum, nasal mucus, vomit, urine, or feces.  Ensure the mask fits over your nose and mouth tightly, and do not touch it during use.  Throw out disposable facemasks and gloves after using them. Do not reuse.  Wash your hands immediately after removing your facemask and gloves.  If your personal clothing becomes contaminated, carefully remove clothing and launder. Wash your hands after handling contaminated clothing.  Place all used disposable facemasks, gloves, and other waste in a lined container before disposing them with other household waste.  Remove  gloves and wash your hands immediately after handling these items.  Do not share dishes, glasses, or other household items with the patient  Avoid sharing household items. You should not share dishes, drinking glasses, cups, eating utensils, towels, bedding, or other items with a patient who is confirmed to have, or being evaluated for, COVID-19 infection.  After the person uses these items, you should wash them thoroughly with soap and water.  Wash laundry thoroughly  Immediately remove and wash clothes or bedding that have blood, body fluids, and/or secretions or excretions, such as sweat, saliva, sputum, nasal mucus, vomit, urine, or feces, on them.  Wear gloves when handling laundry from the patient.  Read and follow directions on labels of laundry or clothing items and detergent. In general, wash and dry with the warmest temperatures recommended on the label.  Clean all areas the individual has used often  Clean all touchable surfaces, such as counters, tabletops, doorknobs, bathroom fixtures, toilets, phones, keyboards, tablets, and bedside tables, every day. Also, clean any surfaces that may have blood, body fluids, and/or secretions or excretions on them.  Wear gloves when cleaning surfaces the patient has come in contact with.  Use a diluted bleach solution (e.g.,  dilute bleach with 1 part bleach and 10 parts water) or a household disinfectant with a label that says EPA-registered for coronaviruses. To make a bleach solution at home, add 1 tablespoon of bleach to 1 quart (4 cups) of water. For a larger supply, add  cup of bleach to 1 gallon (16 cups) of water.  Read labels of cleaning products and follow recommendations provided on product labels. Labels contain instructions for safe and effective use of the cleaning product including precautions you should take when applying the product, such as wearing gloves or eye protection and making sure you have good ventilation during use  of the product.  Remove gloves and wash hands immediately after cleaning.  Monitor yourself for signs and symptoms of illness Caregivers and household members are considered close contacts, should monitor their health, and will be asked to limit movement outside of the home to the extent possible. Follow the monitoring steps for close contacts listed on the symptom monitoring form.   ? If you have additional questions, contact your local health department or call the epidemiologist on call at [phone] (available 24/7). ? This guidance is subject to change. For the most up-to-date guidance from [facility], please refer to their website: [email or web address]    Activity:  As tolerated   Discharge Instructions    Diet - low sodium heart healthy   Complete by: As directed    Discharge instructions   Complete by: As directed    Follow with Primary MD  Ladell Pier, MD in 1-2 weeks  Please get a complete blood count and chemistry panel checked by your Primary MD at your next visit, and again as instructed by your Primary MD.  Get Medicines reviewed and adjusted: Please take all your medications with you for your next visit with your Primary MD  Laboratory/radiological data: Please request your Primary MD to go over all hospital tests and procedure/radiological results at the follow up, please ask your Primary MD to get all Hospital records sent to his/her office.  In some cases, they will be blood work, cultures and biopsy results pending at the time of your discharge. Please request that your primary care M.D. follows up on these results.  Also Note the following: If you experience worsening of your admission symptoms, develop shortness of breath, life threatening emergency, suicidal or homicidal thoughts you must seek medical attention immediately by calling 911 or calling your MD immediately  if symptoms less severe.  You  must read complete instructions/literature along with all the possible adverse reactions/side effects for all the Medicines you take and that have been prescribed to you. Take any new Medicines after you have completely understood and accpet all the possible adverse reactions/side effects.   Do not drive when taking Pain medications or sleeping medications (Benzodaizepines)  Do not take more than prescribed Pain, Sleep and Anxiety Medications. It is not advisable to combine anxiety,sleep and pain medications without talking with your primary care practitioner  Special Instructions: If you have smoked or chewed Tobacco  in the last 2 yrs please stop smoking, stop any regular Alcohol  and or any Recreational drug use.  Wear Seat belts while driving.  Please note: You were cared for by a hospitalist during your hospital stay. Once you are discharged, your primary care physician will handle any further medical issues. Please note that NO REFILLS for any discharge medications will be authorized once you are discharged, as it is imperative that you return to your primary care  physician (or establish a relationship with a primary care physician if you do not have one) for your post hospital discharge needs so that they can reassess your need for medications and monitor your lab values.   Increase activity slowly   Complete by: As directed    No wound care   Complete by: As directed      Allergies as of 01/09/2021      Reactions   Penicillins Rash   Did it involve swelling of the face/tongue/throat, SOB, or low BP?No Did it involve sudden or severe rash/hives, skin peeling, or any reaction on the inside of your mouth or nose?Yes Did you need to seek medical attention at a hospital or doctor's office? no When did it last happen?Child If all above answers are "NO", may proceed with cephalosporin use.      Medication List    STOP taking these medications   fluticasone 50 MCG/ACT nasal  spray Commonly known as: FLONASE   levofloxacin 750 MG tablet Commonly known as: Levaquin     TAKE these medications   albuterol 108 (90 Base) MCG/ACT inhaler Commonly known as: VENTOLIN HFA Inhale 2 puffs into the lungs every 4 (four) hours as needed for wheezing or shortness of breath.   benzonatate 200 MG capsule Commonly known as: TESSALON Take 1 capsule (200 mg total) by mouth 3 (three) times daily as needed for up to 7 days for cough.   Breo Ellipta 200-25 MCG/INH Aepb Generic drug: fluticasone furoate-vilanterol Inhale 1 puff into the lungs daily.   cefdinir 300 MG capsule Commonly known as: OMNICEF Take 1 capsule (300 mg total) by mouth every 12 (twelve) hours for 21 days.   HYDROcodone-acetaminophen 5-325 MG tablet Commonly known as: NORCO/VICODIN Take 1-2 tablets by mouth every 6 (six) hours as needed for severe pain.   ipratropium-albuterol 0.5-2.5 (3) MG/3ML Soln Commonly known as: DUONEB Take 3 mLs by nebulization every 6 (six) hours.       Follow-up Information    Parrett, Fonnie Mu, NP Follow up on 01/28/2021.   Specialty: Pulmonary Disease Why: 4pm  Contact information: [address] [place] [address] [phone]              Allergies  Allergen Reactions  . Penicillins Rash    Did it involve swelling of the face/tongue/throat, SOB, or low BP?No Did it involve sudden or severe rash/hives, skin peeling, or any reaction on the inside of your mouth or nose?Yes Did you need to seek medical attention at a hospital or doctor's office? no When did it last happen?Child If all above answers are "NO", may proceed with cephalosporin use.     Other Procedures/Studies: DG Chest 2 View  Result Date: 12/29/2020 CLINICAL DATA:  Cough. Increasing left lower rib pain. Mass 2 left lower lung. EXAM: CHEST - 2 VIEW COMPARISON:  Radiograph December 13 21.  CT chest 12/01/2020. FINDINGS: Persistent masslike opacity in the left lower lobe, which has  increased since 11/09/2020 radiograph. Direct comparison with the 12/01/2020 CT chest is difficult given differences in modality. New small left pleural effusion. No visible pneumothorax. Cardiomediastinal silhouette is within normal limits. No evidence of acute osseous abnormality. IMPRESSION: Persistent masslike opacity in the left lower lobe with new small left pleural effusion. The opacity is increased relative to prior radiograph from 11/09/2020. Comparison with recent CT chest is limited given differences in modality. Differential considerations remain pneumonia versus malignancy and recommend follow-up chest CT as directed from the  12/01/2020 CT chest. Electronically Signed   By: Margaretha Sheffield MD   On: 12/29/2020 13:22   CT CHEST WO CONTRAST  Result Date: 01/04/2021 CLINICAL DATA:  50 year old with loculated left pleural effusion and chest tube. Patient has undergone pleural fibrinolytic therapy. EXAM: CT CHEST WITHOUT CONTRAST TECHNIQUE: Multidetector CT imaging of the chest was performed following the standard protocol without IV contrast. COMPARISON:  Chest CT 12/31/2020 and chest radiograph 01/03/2021 and chest CT 12/01/2020 FINDINGS: Cardiovascular: Normal caliber of the thoracic aorta without atherosclerotic calcifications. Small pericardial effusion which appears to be slightly increased. Heart size is normal. Mediastinum/Nodes: Prominent subcarinal tissue measuring up to 1.8 cm in short axis on sequence 3, image 27 and similar to the recent comparison examination. However, subcarinal tissue only measured 1.1 cm in the short axis on 12/01/2020. Prominent right paratracheal lymph node measuring 0.9 cm in the short axis on sequence 3, image 20. Again noted are multiple small left axillary lymph nodes. Lungs/Pleura: Left posterior pigtail drain in the pleural space. The drain was placed along the medial aspect of the left lower chest. Overall, the volume of pleural fluid has decreased, particularly  in the left lower chest. There is a small amount of residual pleural thickening or pleural fluid in the left lower chest. Residual loculated pleural fluid in the lingular region. Multiple pockets of pleural air. Residual small pockets of loculated pleural fluid near the superior segment of the left lower lobe. Persistent area of consolidation in the left lower lobe with evidence of central necrosis on sequence 6, image 81. This area roughly measures 4.8 x 4.9 cm. This area roughly measured 5.5 x 3.5 cm on 12/01/2020. Patchy densities throughout the left lower lung compatible with areas of consolidation and volume loss. Right lung is clear. Negative for a right pleural effusion. Upper Abdomen: Images of the upper abdomen are unremarkable. Musculoskeletal: No acute bone abnormality. IMPRESSION: 1. Loculated left pleural effusion has decreased in volume. There continues to be scattered areas of loculated left pleural fluid with pockets of pleural air. Chest tube is positioned in the medial posterior left chest. 2. Persistent consolidation with necrosis in the left lower lobe. This area has persisted since 12/01/2020. Findings are concerning for necrotic pneumonia but an underlying malignancy can not be excluded. 3. Mediastinal lymphadenopathy. This lymphadenopathy has not significantly changed since the recent comparison examination but has increased since 12/01/2020. This lymphadenopathy could be reactive but indeterminate. 4. Small but slightly increased pericardial effusion. Electronically Signed   By: Markus Daft M.D.   On: 01/04/2021 08:30   CT ANGIO CHEST PE W OR WO CONTRAST  Result Date: 12/31/2020 CLINICAL DATA:  Shortness of breath EXAM: CT ANGIOGRAPHY CHEST WITH CONTRAST TECHNIQUE: Multidetector CT imaging of the chest was performed using the standard protocol during bolus administration of intravenous contrast. Multiplanar CT image reconstructions and MIPs were obtained to evaluate the vascular anatomy.  CONTRAST:  127m OMNIPAQUE IOHEXOL 350 MG/ML SOLN COMPARISON:  CT 12/01/2020, 10/14/2020 FINDINGS: Cardiovascular: Satisfactory opacification the pulmonary arteries to the segmental level. No pulmonary artery filling defects are identified. Central pulmonary arteries are top-normal caliber. Cardiac size is top normal. No sizable pericardial effusion. There is however some inflammation in the adjacent paracardial/mediastinal fat which is likely secondary to the it extensive process in the left lung. The aorta is normal caliber. No acute luminal abnormality of the imaged aorta. No periaortic stranding or hemorrhage. Normal 3 vessel branching of the aortic arch. Proximal great vessels are unremarkable. No major venous  abnormalities are seen. Mediastinum/Nodes: Stranding and thickening in the left anterior mediastinal/paracardial fat. No mediastinal free fluid or air. Normal thyroid gland and thoracic inlet. No acute abnormality of the trachea or esophagus. Scattered low-attenuation subcentimeter mediastinal and hilar adenopathy. Several larger subcarinal and left hilar nodes are present measuring up to, 1.5 cm and 1.2 cm respectively (7/189), quite similar to prior. There is some increasing left axillary adenopathy is present as well with some mild surrounding stranding which could reflect a reactive process. Lungs/Pleura: Increasing consolidative opacity centered in the left lower with hypoattenuation of the consolidated parenchyma suggestive of some underlying infection with scattered air lucencies, possibly developing cavitation (6/77). Development of a complex, loculated pleural effusion extending over the lung apex, into the fissures, and throughout the lung base. Suspect some reactive stranding of the adjacent mediastinal fat as detailed above. Several areas suggest some associated pleural thickening which could reflect a developing empyema. Right lung remains essentially clear without visible right pleural  effusion. No pneumothorax. Upper Abdomen: No acute abnormalities present in the visualized portions of the upper abdomen. Musculoskeletal: No acute osseous abnormality or suspicious osseous lesion. Multilevel degenerative changes are present in the imaged portions of the spine. No clear involvement of the chest wall. No erosive or destructive changes of the adjacent ribs. No worrisome chest wall masses or lesions. Review of the MIP images confirms the above findings. IMPRESSION: 1. No pulmonary embolism is seen. 2. Increasing consolidative opacity centered in the left lower with hypoattenuation of the consolidated parenchyma suggestive of some underlying infection with scattered air lucencies, possibly developing cavitation or necrosis. Additional development of a complex, loculated pleural effusion extending over the lung apex, into the fissure, and throughout the lung base. Several areas suggest some associated pleural thickening/empyema. Continued follow-up to resolution is recommended as an underlying mass/malignancy is difficult to fully exclude. 3. Some increasing scattered subcentimeter hypoattenuating mediastinal, hilar and left axillary adenopathy, may be reactive though continued attention on follow-up imaging is warranted pending exclusion of malignancy. These results were called by telephone at the time of interpretation on 12/31/2020 at 6:16 am to provider [facility] , who verbally acknowledged these results. Electronically Signed   By: Lovena Le M.D.   On: 12/31/2020 06:17   DG Chest Port 1 View  Result Date: 01/09/2021 CLINICAL DATA:  Status post chest tube removal EXAM: PORTABLE CHEST 1 VIEW COMPARISON:  January 08, 2021 FINDINGS: Chest tube has been removed. No pneumothorax. There is loculated pleural fluid with consolidation left base. There is also atelectatic change in this area. The right lung is clear. Heart size and pulmonary vascularity are normal. No adenopathy. No bone lesions.  IMPRESSION: Chest tube has been removed without pneumothorax. Combination of loculated pleural effusion and consolidation left base remains. Nearby atelectasis is stable. Lungs elsewhere clear. Stable cardiac silhouette. Electronically Signed   By: Lowella Grip III M.D.   On: 01/09/2021 09:29   DG Chest Port 1 View  Result Date: 01/08/2021 CLINICAL DATA:  50 year old male with loculated left pleural effusion, pleural fibrinolytic therapy. EXAM: PORTABLE CHEST 1 VIEW COMPARISON:  Portable chest 01/06/2021 and earlier. FINDINGS: Portable AP semi upright view at 0446 hours. Left chest tube remains stable. Larger lung volumes. Patchy and confluent left lung base opacity appears stable since the CT on 01/04/2021 (please see that report) no pneumothorax identified. Mediastinal contour remains normal. Right lung remains negative. Negative bowel gas. Stable visualized osseous structures. IMPRESSION: Stable since the CT on 01/04/2021 (please see that report). No  new cardiopulmonary abnormality. Electronically Signed   By: Genevie Ann M.D.   On: 01/08/2021 07:19   DG Chest Port 1 View  Result Date: 01/06/2021 CLINICAL DATA:  Follow-up left chest tube placement EXAM: PORTABLE CHEST 1 VIEW COMPARISON:  01/05/2021 FINDINGS: Pigtail catheter is again noted over the left lung base. Persistent atelectatic changes are seen. No sizable effusion is noted. No pneumothorax is seen. Right lung remains clear. Cardiac shadow is stable. IMPRESSION: No significant recurrent pleural effusion is noted. Persistent left basilar atelectasis is seen. Electronically Signed   By: Inez Catalina M.D.   On: 01/06/2021 11:14   DG Chest Port 1 View  Result Date: 01/05/2021 CLINICAL DATA:  Chest tube placement for empyema EXAM: PORTABLE CHEST 1 VIEW COMPARISON:  Chest radiograph January 03, 2021 and chest CT January 04, 2021 FINDINGS: Pigtail catheter present on the left inferomedially, stable. Ill-defined areas of airspace opacity in the left  lower lung region appear essentially stable. There are areas of questionable cavitation in this area, better seen on recent CT. There is ill-defined opacity in the left upper and mid lung regions, slightly less than on recent studies. Right lung is clear. Heart size and pulmonary vascularity are normal. No adenopathy. No bone lesions. IMPRESSION: Stable catheter position inferomedially on the left. No pneumothorax. Airspace consolidation with areas of questionable cavitation in this region, better delineated on recent CT. Slightly less opacity left upper and mid lung regions. No new opacities evident. Right lung clear. Stable cardiac silhouette. Electronically Signed   By: Lowella Grip III M.D.   On: 01/05/2021 09:57   DG CHEST PORT 1 VIEW  Result Date: 01/03/2021 CLINICAL DATA:  Chest pain. EXAM: PORTABLE CHEST 1 VIEW COMPARISON:  January 02, 2021. FINDINGS: Stable cardiomegaly. Right lung is clear. Stable position of left-sided chest tube is noted with grossly stable left pleural effusion and associated atelectasis or infiltrate. Bony thorax is unremarkable. No pneumothorax is noted. IMPRESSION: Stable position of left-sided chest tube with grossly stable left pleural effusion and associated atelectasis or infiltrate. Electronically Signed   By: Marijo Conception M.D.   On: 01/03/2021 08:38   DG Chest Port 1 View  Result Date: 01/02/2021 CLINICAL DATA:  Shortness of breath, COVID-19 positive. EXAM: PORTABLE CHEST 1 VIEW COMPARISON:  January 01, 2021. FINDINGS: Stable cardiomegaly. No pneumothorax is noted. Stable position of left-sided chest tube. Right lung is clear. Loculated left pleural effusion noted on prior exam is significantly smaller currently. Stable left basilar atelectasis or infiltrate is noted. Bony thorax is unremarkable. IMPRESSION: Stable position of left-sided chest tube. Loculated left pleural effusion noted on prior exam is significantly smaller currently. Stable left basilar  atelectasis or infiltrate is noted. Electronically Signed   By: Marijo Conception M.D.   On: 01/02/2021 09:53   DG CHEST PORT 1 VIEW  Result Date: 01/01/2021 CLINICAL DATA:  Pleural effusion, chest tube, COVID pneumonia EXAM: PORTABLE CHEST 1 VIEW COMPARISON:  12/31/2020 FINDINGS: Large, partially laterally loculated left pleural effusion has enlarged in size. Left basilar pigtail chest tube positioned inferomedially is unchanged. Interval development of mild mediastinal shift to the right. Right lung is clear. No pneumothorax. No pleural effusion on the right. Cardiac size within normal limits. IMPRESSION: Left chest tube unchanged. Progressive enlargement of a partially loculated left pleural effusion with developing mediastinal shift to the right. CT examination may be helpful to assess catheter position in relation to the developing pleural fluid. Electronically Signed   By: Fidela Salisbury MD  On: 01/01/2021 08:15   DG CHEST PORT 1 VIEW  Result Date: 12/31/2020 CLINICAL DATA:  Loculated pleural effusion. EXAM: PORTABLE CHEST 1 VIEW COMPARISON:  CT 12/31/2020.  Chest x-ray 12/29/2020. FINDINGS: Left chest tube noted over the left lower chest. Prominent left pleural effusion with possible loculation. Underlying left base atelectasis/infiltrate most likely present. No pneumothorax. Heart size stable. IMPRESSION: Left chest tube noted over the left lower chest. Prominent left pleural effusion with possible loculation. Underlying left base atelectasis/infiltrate most likely present. No pneumothorax. Electronically Signed   By: Marcello Moores  Register   On: 12/31/2020 11:43   VAS [place] LOWER EXTREMITY VENOUS (DVT)  Result Date: 01/03/2021  Lower Venous DVT Study Indications: Covid, elevated d-dimer.  Anticoagulation: Lovenox. Comparison Study: No prior studies. Performing Technologist: Darlin Coco RDMS  Examination Guidelines: A complete evaluation includes B-mode imaging, spectral Doppler, color Doppler, and power  Doppler as needed of all accessible portions of each vessel. Bilateral testing is considered an integral part of a complete examination. Limited examinations for reoccurring indications may be performed as noted. The reflux portion of the exam is performed with the patient in reverse Trendelenburg.  +---------+---------------+---------+-----------+----------+--------------+ RIGHT    CompressibilityPhasicitySpontaneityPropertiesThrombus Aging +---------+---------------+---------+-----------+----------+--------------+ CFV      Full           Yes      Yes                                 +---------+---------------+---------+-----------+----------+--------------+ SFJ      Full                                                        +---------+---------------+---------+-----------+----------+--------------+ FV Prox  Full                                                        +---------+---------------+---------+-----------+----------+--------------+ FV Mid   Full                                                        +---------+---------------+---------+-----------+----------+--------------+ FV DistalFull                                                        +---------+---------------+---------+-----------+----------+--------------+ PFV      Full                                                        +---------+---------------+---------+-----------+----------+--------------+ POP      Full           Yes      Yes                                 +---------+---------------+---------+-----------+----------+--------------+  PTV      Full                                                        +---------+---------------+---------+-----------+----------+--------------+ PERO     Full                                                        +---------+---------------+---------+-----------+----------+--------------+    +---------+---------------+---------+-----------+----------+--------------+ LEFT     CompressibilityPhasicitySpontaneityPropertiesThrombus Aging +---------+---------------+---------+-----------+----------+--------------+ CFV      Full           Yes      Yes                                 +---------+---------------+---------+-----------+----------+--------------+ SFJ      Full                                                        +---------+---------------+---------+-----------+----------+--------------+ FV Prox  Full                                                        +---------+---------------+---------+-----------+----------+--------------+ FV Mid   Full                                                        +---------+---------------+---------+-----------+----------+--------------+ FV DistalFull                                                        +---------+---------------+---------+-----------+----------+--------------+ PFV      Full                                                        +---------+---------------+---------+-----------+----------+--------------+ POP      Full           Yes      Yes                                 +---------+---------------+---------+-----------+----------+--------------+ PTV      Full                                                        +---------+---------------+---------+-----------+----------+--------------+  PERO     Full                                                        +---------+---------------+---------+-----------+----------+--------------+     Summary: RIGHT: - There is no evidence of deep vein thrombosis in the lower extremity.  - No cystic structure found in the popliteal fossa.  LEFT: - There is no evidence of deep vein thrombosis in the lower extremity.  - No cystic structure found in the popliteal fossa.  *See table(s) above for measurements and observations. Electronically signed  by Monica Martinez MD on 01/03/2021 at 2:28:49 PM.    Final      TODAY-DAY OF DISCHARGE:  Subjective:   Joseph Fitzgerald today has no headache,no chest abdominal pain,no new weakness tingling or numbness, feels much better wants to go home today.   Objective:   Blood pressure 101/72, pulse 73, temperature 98.1 F (36.7 C), temperature source Oral, resp. rate 16, height 5' (1.524 m), weight 77 kg, SpO2 99 %.  Intake/Output Summary (Last 24 hours) at 01/09/2021 1017 Last data filed at 01/08/2021 2030 Gross per 24 hour  Intake 490 ml  Output -  Net 490 ml   Filed Weights   12/31/20 1530 01/02/21 0402  Weight: 76.5 kg 77 kg    Exam: Awake Alert, Oriented *3, No new F.N deficits, Normal affect Fifty-Six.AT,PERRAL Supple Neck,No JVD, No cervical lymphadenopathy appriciated.  Symmetrical Chest wall movement, Good air movement bilaterally, CTAB RRR,No Gallops,Rubs or new Murmurs, No Parasternal Heave +ve B.Sounds, Abd Soft, Non tender, No organomegaly appriciated, No rebound -guarding or rigidity. No Cyanosis, Clubbing or edema, No new Rash or bruise   PERTINENT RADIOLOGIC STUDIES: DG Chest 2 View  Result Date: 12/29/2020 CLINICAL DATA:  Cough. Increasing left lower rib pain. Mass 2 left lower lung. EXAM: CHEST - 2 VIEW COMPARISON:  Radiograph December 13 21.  CT chest 12/01/2020. FINDINGS: Persistent masslike opacity in the left lower lobe, which has increased since 11/09/2020 radiograph. Direct comparison with the 12/01/2020 CT chest is difficult given differences in modality. New small left pleural effusion. No visible pneumothorax. Cardiomediastinal silhouette is within normal limits. No evidence of acute osseous abnormality. IMPRESSION: Persistent masslike opacity in the left lower lobe with new small left pleural effusion. The opacity is increased relative to prior radiograph from 11/09/2020. Comparison with recent CT chest is limited given differences in modality. Differential considerations  remain pneumonia versus malignancy and recommend follow-up chest CT as directed from the 12/01/2020 CT chest. Electronically Signed   By: Margaretha Sheffield MD   On: 12/29/2020 13:22   CT CHEST WO CONTRAST  Result Date: 01/04/2021 CLINICAL DATA:  50 year old with loculated left pleural effusion and chest tube. Patient has undergone pleural fibrinolytic therapy. EXAM: CT CHEST WITHOUT CONTRAST TECHNIQUE: Multidetector CT imaging of the chest was performed following the standard protocol without IV contrast. COMPARISON:  Chest CT 12/31/2020 and chest radiograph 01/03/2021 and chest CT 12/01/2020 FINDINGS: Cardiovascular: Normal caliber of the thoracic aorta without atherosclerotic calcifications. Small pericardial effusion which appears to be slightly increased. Heart size is normal. Mediastinum/Nodes: Prominent subcarinal tissue measuring up to 1.8 cm in short axis on sequence 3, image 27 and similar to the recent comparison examination. However, subcarinal tissue only measured 1.1 cm in the short axis on 12/01/2020. Prominent right paratracheal  lymph node measuring 0.9 cm in the short axis on sequence 3, image 20. Again noted are multiple small left axillary lymph nodes. Lungs/Pleura: Left posterior pigtail drain in the pleural space. The drain was placed along the medial aspect of the left lower chest. Overall, the volume of pleural fluid has decreased, particularly in the left lower chest. There is a small amount of residual pleural thickening or pleural fluid in the left lower chest. Residual loculated pleural fluid in the lingular region. Multiple pockets of pleural air. Residual small pockets of loculated pleural fluid near the superior segment of the left lower lobe. Persistent area of consolidation in the left lower lobe with evidence of central necrosis on sequence 6, image 81. This area roughly measures 4.8 x 4.9 cm. This area roughly measured 5.5 x 3.5 cm on 12/01/2020. Patchy densities throughout the  left lower lung compatible with areas of consolidation and volume loss. Right lung is clear. Negative for a right pleural effusion. Upper Abdomen: Images of the upper abdomen are unremarkable. Musculoskeletal: No acute bone abnormality. IMPRESSION: 1. Loculated left pleural effusion has decreased in volume. There continues to be scattered areas of loculated left pleural fluid with pockets of pleural air. Chest tube is positioned in the medial posterior left chest. 2. Persistent consolidation with necrosis in the left lower lobe. This area has persisted since 12/01/2020. Findings are concerning for necrotic pneumonia but an underlying malignancy can not be excluded. 3. Mediastinal lymphadenopathy. This lymphadenopathy has not significantly changed since the recent comparison examination but has increased since 12/01/2020. This lymphadenopathy could be reactive but indeterminate. 4. Small but slightly increased pericardial effusion. Electronically Signed   By: Markus Daft M.D.   On: 01/04/2021 08:30   CT ANGIO CHEST PE W OR WO CONTRAST  Result Date: 12/31/2020 CLINICAL DATA:  Shortness of breath EXAM: CT ANGIOGRAPHY CHEST WITH CONTRAST TECHNIQUE: Multidetector CT imaging of the chest was performed using the standard protocol during bolus administration of intravenous contrast. Multiplanar CT image reconstructions and MIPs were obtained to evaluate the vascular anatomy. CONTRAST:  133m OMNIPAQUE IOHEXOL 350 MG/ML SOLN COMPARISON:  CT 12/01/2020, 10/14/2020 FINDINGS: Cardiovascular: Satisfactory opacification the pulmonary arteries to the segmental level. No pulmonary artery filling defects are identified. Central pulmonary arteries are top-normal caliber. Cardiac size is top normal. No sizable pericardial effusion. There is however some inflammation in the adjacent paracardial/mediastinal fat which is likely secondary to the it extensive process in the left lung. The aorta is normal caliber. No acute luminal  abnormality of the imaged aorta. No periaortic stranding or hemorrhage. Normal 3 vessel branching of the aortic arch. Proximal great vessels are unremarkable. No major venous abnormalities are seen. Mediastinum/Nodes: Stranding and thickening in the left anterior mediastinal/paracardial fat. No mediastinal free fluid or air. Normal thyroid gland and thoracic inlet. No acute abnormality of the trachea or esophagus. Scattered low-attenuation subcentimeter mediastinal and hilar adenopathy. Several larger subcarinal and left hilar nodes are present measuring up to, 1.5 cm and 1.2 cm respectively (7/189), quite similar to prior. There is some increasing left axillary adenopathy is present as well with some mild surrounding stranding which could reflect a reactive process. Lungs/Pleura: Increasing consolidative opacity centered in the left lower with hypoattenuation of the consolidated parenchyma suggestive of some underlying infection with scattered air lucencies, possibly developing cavitation (6/77). Development of a complex, loculated pleural effusion extending over the lung apex, into the fissures, and throughout the lung base. Suspect some reactive stranding of the adjacent mediastinal fat as  detailed above. Several areas suggest some associated pleural thickening which could reflect a developing empyema. Right lung remains essentially clear without visible right pleural effusion. No pneumothorax. Upper Abdomen: No acute abnormalities present in the visualized portions of the upper abdomen. Musculoskeletal: No acute osseous abnormality or suspicious osseous lesion. Multilevel degenerative changes are present in the imaged portions of the spine. No clear involvement of the chest wall. No erosive or destructive changes of the adjacent ribs. No worrisome chest wall masses or lesions. Review of the MIP images confirms the above findings. IMPRESSION: 1. No pulmonary embolism is seen. 2. Increasing consolidative opacity  centered in the left lower with hypoattenuation of the consolidated parenchyma suggestive of some underlying infection with scattered air lucencies, possibly developing cavitation or necrosis. Additional development of a complex, loculated pleural effusion extending over the lung apex, into the fissure, and throughout the lung base. Several areas suggest some associated pleural thickening/empyema. Continued follow-up to resolution is recommended as an underlying mass/malignancy is difficult to fully exclude. 3. Some increasing scattered subcentimeter hypoattenuating mediastinal, hilar and left axillary adenopathy, may be reactive though continued attention on follow-up imaging is warranted pending exclusion of malignancy. These results were called by telephone at the time of interpretation on 12/31/2020 at 6:16 am to provider [facility] , who verbally acknowledged these results. Electronically Signed   By: Lovena Le M.D.   On: 12/31/2020 06:17   DG Chest Port 1 View  Result Date: 01/09/2021 CLINICAL DATA:  Status post chest tube removal EXAM: PORTABLE CHEST 1 VIEW COMPARISON:  January 08, 2021 FINDINGS: Chest tube has been removed. No pneumothorax. There is loculated pleural fluid with consolidation left base. There is also atelectatic change in this area. The right lung is clear. Heart size and pulmonary vascularity are normal. No adenopathy. No bone lesions. IMPRESSION: Chest tube has been removed without pneumothorax. Combination of loculated pleural effusion and consolidation left base remains. Nearby atelectasis is stable. Lungs elsewhere clear. Stable cardiac silhouette. Electronically Signed   By: Lowella Grip III M.D.   On: 01/09/2021 09:29   DG Chest Port 1 View  Result Date: 01/08/2021 CLINICAL DATA:  50 year old male with loculated left pleural effusion, pleural fibrinolytic therapy. EXAM: PORTABLE CHEST 1 VIEW COMPARISON:  Portable chest 01/06/2021 and earlier. FINDINGS: Portable AP semi  upright view at 0446 hours. Left chest tube remains stable. Larger lung volumes. Patchy and confluent left lung base opacity appears stable since the CT on 01/04/2021 (please see that report) no pneumothorax identified. Mediastinal contour remains normal. Right lung remains negative. Negative bowel gas. Stable visualized osseous structures. IMPRESSION: Stable since the CT on 01/04/2021 (please see that report). No new cardiopulmonary abnormality. Electronically Signed   By: Genevie Ann M.D.   On: 01/08/2021 07:19   DG Chest Port 1 View  Result Date: 01/06/2021 CLINICAL DATA:  Follow-up left chest tube placement EXAM: PORTABLE CHEST 1 VIEW COMPARISON:  01/05/2021 FINDINGS: Pigtail catheter is again noted over the left lung base. Persistent atelectatic changes are seen. No sizable effusion is noted. No pneumothorax is seen. Right lung remains clear. Cardiac shadow is stable. IMPRESSION: No significant recurrent pleural effusion is noted. Persistent left basilar atelectasis is seen. Electronically Signed   By: Inez Catalina M.D.   On: 01/06/2021 11:14   DG Chest Port 1 View  Result Date: 01/05/2021 CLINICAL DATA:  Chest tube placement for empyema EXAM: PORTABLE CHEST 1 VIEW COMPARISON:  Chest radiograph January 03, 2021 and chest CT January 04, 2021 FINDINGS:  Pigtail catheter present on the left inferomedially, stable. Ill-defined areas of airspace opacity in the left lower lung region appear essentially stable. There are areas of questionable cavitation in this area, better seen on recent CT. There is ill-defined opacity in the left upper and mid lung regions, slightly less than on recent studies. Right lung is clear. Heart size and pulmonary vascularity are normal. No adenopathy. No bone lesions. IMPRESSION: Stable catheter position inferomedially on the left. No pneumothorax. Airspace consolidation with areas of questionable cavitation in this region, better delineated on recent CT. Slightly less opacity left upper  and mid lung regions. No new opacities evident. Right lung clear. Stable cardiac silhouette. Electronically Signed   By: Lowella Grip III M.D.   On: 01/05/2021 09:57   DG CHEST PORT 1 VIEW  Result Date: 01/03/2021 CLINICAL DATA:  Chest pain. EXAM: PORTABLE CHEST 1 VIEW COMPARISON:  January 02, 2021. FINDINGS: Stable cardiomegaly. Right lung is clear. Stable position of left-sided chest tube is noted with grossly stable left pleural effusion and associated atelectasis or infiltrate. Bony thorax is unremarkable. No pneumothorax is noted. IMPRESSION: Stable position of left-sided chest tube with grossly stable left pleural effusion and associated atelectasis or infiltrate. Electronically Signed   By: Marijo Conception M.D.   On: 01/03/2021 08:38   DG Chest Port 1 View  Result Date: 01/02/2021 CLINICAL DATA:  Shortness of breath, COVID-19 positive. EXAM: PORTABLE CHEST 1 VIEW COMPARISON:  January 01, 2021. FINDINGS: Stable cardiomegaly. No pneumothorax is noted. Stable position of left-sided chest tube. Right lung is clear. Loculated left pleural effusion noted on prior exam is significantly smaller currently. Stable left basilar atelectasis or infiltrate is noted. Bony thorax is unremarkable. IMPRESSION: Stable position of left-sided chest tube. Loculated left pleural effusion noted on prior exam is significantly smaller currently. Stable left basilar atelectasis or infiltrate is noted. Electronically Signed   By: Marijo Conception M.D.   On: 01/02/2021 09:53   DG CHEST PORT 1 VIEW  Result Date: 01/01/2021 CLINICAL DATA:  Pleural effusion, chest tube, COVID pneumonia EXAM: PORTABLE CHEST 1 VIEW COMPARISON:  12/31/2020 FINDINGS: Large, partially laterally loculated left pleural effusion has enlarged in size. Left basilar pigtail chest tube positioned inferomedially is unchanged. Interval development of mild mediastinal shift to the right. Right lung is clear. No pneumothorax. No pleural effusion on the right.  Cardiac size within normal limits. IMPRESSION: Left chest tube unchanged. Progressive enlargement of a partially loculated left pleural effusion with developing mediastinal shift to the right. CT examination may be helpful to assess catheter position in relation to the developing pleural fluid. Electronically Signed   By: Fidela Salisbury MD   On: 01/01/2021 08:15   DG CHEST PORT 1 VIEW  Result Date: 12/31/2020 CLINICAL DATA:  Loculated pleural effusion. EXAM: PORTABLE CHEST 1 VIEW COMPARISON:  CT 12/31/2020.  Chest x-ray 12/29/2020. FINDINGS: Left chest tube noted over the left lower chest. Prominent left pleural effusion with possible loculation. Underlying left base atelectasis/infiltrate most likely present. No pneumothorax. Heart size stable. IMPRESSION: Left chest tube noted over the left lower chest. Prominent left pleural effusion with possible loculation. Underlying left base atelectasis/infiltrate most likely present. No pneumothorax. Electronically Signed   By: Marcello Moores  Register   On: 12/31/2020 11:43   VAS [place] LOWER EXTREMITY VENOUS (DVT)  Result Date: 01/03/2021  Lower Venous DVT Study Indications: Covid, elevated d-dimer.  Anticoagulation: Lovenox. Comparison Study: No prior studies. Performing Technologist: Darlin Coco RDMS  Examination Guidelines: A complete evaluation  includes B-mode imaging, spectral Doppler, color Doppler, and power Doppler as needed of all accessible portions of each vessel. Bilateral testing is considered an integral part of a complete examination. Limited examinations for reoccurring indications may be performed as noted. The reflux portion of the exam is performed with the patient in reverse Trendelenburg.  +---------+---------------+---------+-----------+----------+--------------+ RIGHT    CompressibilityPhasicitySpontaneityPropertiesThrombus Aging +---------+---------------+---------+-----------+----------+--------------+ CFV      Full           Yes      Yes                                  +---------+---------------+---------+-----------+----------+--------------+ SFJ      Full                                                        +---------+---------------+---------+-----------+----------+--------------+ FV Prox  Full                                                        +---------+---------------+---------+-----------+----------+--------------+ FV Mid   Full                                                        +---------+---------------+---------+-----------+----------+--------------+ FV DistalFull                                                        +---------+---------------+---------+-----------+----------+--------------+ PFV      Full                                                        +---------+---------------+---------+-----------+----------+--------------+ POP      Full           Yes      Yes                                 +---------+---------------+---------+-----------+----------+--------------+ PTV      Full                                                        +---------+---------------+---------+-----------+----------+--------------+ PERO     Full                                                        +---------+---------------+---------+-----------+----------+--------------+   +---------+---------------+---------+-----------+----------+--------------+  LEFT     CompressibilityPhasicitySpontaneityPropertiesThrombus Aging +---------+---------------+---------+-----------+----------+--------------+ CFV      Full           Yes      Yes                                 +---------+---------------+---------+-----------+----------+--------------+ SFJ      Full                                                        +---------+---------------+---------+-----------+----------+--------------+ FV Prox  Full                                                         +---------+---------------+---------+-----------+----------+--------------+ FV Mid   Full                                                        +---------+---------------+---------+-----------+----------+--------------+ FV DistalFull                                                        +---------+---------------+---------+-----------+----------+--------------+ PFV      Full                                                        +---------+---------------+---------+-----------+----------+--------------+ POP      Full           Yes      Yes                                 +---------+---------------+---------+-----------+----------+--------------+ PTV      Full                                                        +---------+---------------+---------+-----------+----------+--------------+ PERO     Full                                                        +---------+---------------+---------+-----------+----------+--------------+     Summary: RIGHT: - There is no evidence of deep vein thrombosis in the lower extremity.  - No cystic structure found in the popliteal fossa.  LEFT: - There is no evidence of deep vein thrombosis in the lower extremity.  - No  cystic structure found in the popliteal fossa.  *See table(s) above for measurements and observations. Electronically signed by Monica Martinez MD on 01/03/2021 at 2:28:49 PM.    Final      PERTINENT LAB RESULTS: CBC: Recent Labs    01/07/21 0756  WBC 8.2  HGB 13.4  HCT 39.1  PLT 396   CMET CMP     Component Value Date/Time   NA 138 01/07/2021 0756   NA 138 10/19/2020 1012   K 3.8 01/07/2021 0756   CL 103 01/07/2021 0756   CO2 25 01/07/2021 0756   GLUCOSE 118 (H) 01/07/2021 0756   BUN 7 01/07/2021 0756   BUN 12 10/19/2020 1012   CREATININE 0.67 01/07/2021 0756   CREATININE 0.97 01/22/2015 1009   CALCIUM 8.1 (L) 01/07/2021 0756   PROT 6.3 (L) 01/05/2021 0052   PROT 8.0 10/19/2020 1012   ALBUMIN  1.8 (L) 01/05/2021 0052   ALBUMIN 4.1 10/19/2020 1012   AST 22 01/05/2021 0052   ALT 16 01/05/2021 0052   ALKPHOS 56 01/05/2021 0052   BILITOT 0.5 01/05/2021 0052   BILITOT 0.4 10/19/2020 1012   GFRNONAA >60 01/07/2021 0756   GFRNONAA >89 01/22/2015 1009   GFRAA 96 10/19/2020 1012   GFRAA >89 01/22/2015 1009    GFR Estimated Creatinine Clearance: 98.2 mL/min (by C-G formula based on SCr of 0.67 mg/dL). No results for input(s): LIPASE, AMYLASE in the last 72 hours. No results for input(s): CKTOTAL, CKMB, CKMBINDEX, TROPONINI in the last 72 hours. Invalid input(s): POCBNP No results for input(s): DDIMER in the last 72 hours. No results for input(s): HGBA1C in the last 72 hours. No results for input(s): CHOL, HDL, LDLCALC, TRIG, CHOLHDL, LDLDIRECT in the last 72 hours. No results for input(s): TSH, T4TOTAL, T3FREE, THYROIDAB in the last 72 hours.  Invalid input(s): FREET3 No results for input(s): VITAMINB12, FOLATE, FERRITIN, TIBC, IRON, RETICCTPCT in the last 72 hours. Coags: No results for input(s): INR in the last 72 hours.  Invalid input(s): PT Microbiology: Recent Results (from the past 240 hour(s))  SARS Coronavirus 2 by RT PCR (hospital order, performed in [facility] hospital lab) Nasopharyngeal Nasopharyngeal Swab     Status: Abnormal   Collection Time: 12/30/20 10:50 PM   Specimen: Nasopharyngeal Swab  Result Value Ref Range Status   SARS Coronavirus 2 POSITIVE (A) NEGATIVE Final    Comment: RESULT CALLED TO, READ BACK BY AND VERIFIED WITH: C CRISCO RN 12/31/20 0044 JDW (NOTE) SARS-CoV-2 target nucleic acids are DETECTED  SARS-CoV-2 RNA is generally detectable in upper respiratory specimens  during the acute phase of infection.  Positive results are indicative  of the presence of the identified virus, but do not rule out bacterial infection or co-infection with other pathogens not detected by the test.  Clinical correlation with patient history and  other diagnostic  information is necessary to determine patient infection status.  The expected result is negative.  Fact Sheet for Patients:   StrictlyIdeas.no   Fact Sheet for Healthcare Providers:   BankingDealers.co.za    This test is not yet approved or cleared by the [place] FDA and  has been authorized for detection and/or diagnosis of SARS-CoV-2 by FDA under an Emergency Use Authorization (EUA).  This EUA will remain in effect (meaning this test can  be used) for the duration of  the COVID-19 declaration under Section 564(b)(1) of the Act, 21 U.S.C. section 360-bbb-3(b)(1), unless the authorization is terminated or revoked sooner.  Performed at [facility], [place] [address], [place], [place] [address]   Culture, blood (routine x 2) Call MD if unable to obtain prior to antibiotics being given     Status: None   Collection Time: 12/31/20  8:42 AM   Specimen: BLOOD  Result Value Ref Range Status   Specimen Description BLOOD LEFT ANTECUBITAL  Final   Special Requests   Final    BOTTLES DRAWN AEROBIC AND ANAEROBIC Blood Culture results may not be optimal due to an inadequate volume of blood received in culture bottles   Culture   Final    NO GROWTH 5 DAYS Performed at [place] [address], [place], [place] [address]    Report Status 01/05/2021 FINAL  Final  Culture, blood (routine x 2) Call MD if unable to obtain prior to antibiotics being given     Status: None   Collection Time: 12/31/20  9:14 AM   Specimen: BLOOD  Result Value Ref Range Status   Specimen Description BLOOD RIGHT ANTECUBITAL  Final   Special Requests   Final    BOTTLES DRAWN AEROBIC AND ANAEROBIC Blood Culture adequate volume   Culture   Final    NO GROWTH 5 DAYS Performed at [facility], [place] [address], [place], [place] [address]    Report Status 01/05/2021 FINAL  Final  Body fluid culture (includes gram stain)     Status: None   Collection  Time: 12/31/20 11:18 AM   Specimen: Pleural Fluid  Result Value Ref Range Status   Specimen Description PLEURAL FLUID  Final   Special Requests NONE  Final   Gram Stain   Final    ABUNDANT WBC PRESENT,BOTH PMN AND MONONUCLEAR NO ORGANISMS SEEN    Culture   Final    NO GROWTH Performed at [facility], [place] [address], [place], [place] [address]    Report Status 01/03/2021 FINAL  Final  Fungus Culture With Stain     Status: None (Preliminary result)   Collection Time: 12/31/20 11:18 AM   Specimen: Pleural Fluid  Result Value Ref Range Status   Fungus Stain Final report  Final    Comment: (NOTE) Performed At: [facility] [place], [place] [id number] Rush Farmer MD GM:[id number]    Fungus (Mycology) Culture PENDING  Incomplete   Fungal Source PLEURAL  Final    Comment: FLUID Performed at [facility], [place] [address] [place], [place] [address]   Acid Fast Smear (AFB)     Status: None   Collection Time: 12/31/20 11:18 AM   Specimen: Pleural; Respiratory  Result Value Ref Range Status   AFB Specimen Processing Concentration  Final   Acid Fast Smear Negative  Final    Comment: (NOTE) Performed At: [facility] [place], [place] [id number] Rush Farmer MD [id number]    Source (AFB) PLEURAL  Final    Comment: FLUID Performed at [facility], [place] [address], [place], [place] [address]   Fungus Culture Result     Status: None   Collection Time: 12/31/20 11:18 AM  Result Value Ref Range Status   Result 1 Comment  Final    Comment: (NOTE) KOH/Calcofluor preparation:  no fungus observed. Performed At: [facility] [place], [place] [id number] Rush Farmer MD NA:[id number]     FURTHER DISCHARGE INSTRUCTIONS:  Get Medicines reviewed and adjusted: Please take all your medications with you for your next visit with your  Primary MD  Laboratory/radiological data: Please request your  Primary MD to go over all hospital tests and procedure/radiological results at the follow up, please ask your Primary MD to get all Hospital records sent to his/her office.  In some cases, they will be blood work, cultures and biopsy results pending at the time of your discharge. Please request that your primary care M.D. goes through all the records of your hospital data and follows up on these results.  Also Note the following: If you experience worsening of your admission symptoms, develop shortness of breath, life threatening emergency, suicidal or homicidal thoughts you must seek medical attention immediately by calling 911 or calling your MD immediately  if symptoms less severe.  You must read complete instructions/literature along with all the possible adverse reactions/side effects for all the Medicines you take and that have been prescribed to you. Take any new Medicines after you have completely understood and accpet all the possible adverse reactions/side effects.   Do not drive when taking Pain medications or sleeping medications (Benzodaizepines)  Do not take more than prescribed Pain, Sleep and Anxiety Medications. It is not advisable to combine anxiety,sleep and pain medications without talking with your primary care practitioner  Special Instructions: If you have smoked or chewed Tobacco  in the last 2 yrs please stop smoking, stop any regular Alcohol  and or any Recreational drug use.  Wear Seat belts while driving.  Please note: You were cared for by a hospitalist during your hospital stay. Once you are discharged, your primary care physician will handle any further medical issues. Please note that NO REFILLS for any discharge medications will be authorized once you are discharged, as it is imperative that you return to your primary care physician (or establish a relationship with a primary care physician if you do not have one) for your post hospital discharge needs so that they  can reassess your need for medications and monitor your lab values.  Total Time spent coordinating discharge including counseling, education and face to face time equals 25  minutes.  SignedOren Binet 01/09/2021 10:17 AM

## 2021-01-09 NOTE — Plan of Care (Signed)
The patient is very receptive to discharge instructions.  His VSS, all morning medications administered, IV lines removed.  Patient is ready for discharge.

## 2021-01-11 ENCOUNTER — Telehealth: Payer: Self-pay

## 2021-01-11 NOTE — Telephone Encounter (Signed)
Transition Care Management Follow-up Telephone Call  Date of discharge and from where:[facility] on 01/09/2021 How have you been since you were released from the hospital? Stated "feeling Fine" Any questions or concerns? No questions/concerns reported.  Items Reviewed: Did the pt receive and understand the discharge instructions provided? Stated that have the instructions and have no questions.  Medications obtained and verified? He said that they have the medication list  and were reviewed. Michela Pitcher that he has all of the medications and they have no questions.  Any new allergies since your discharge? None reported  Do you have support at home? Yes, family Other (ie: DME, Home Health, etc)       Functional Questionnaire: (I = Independent and D = Dependent) ADL's:  Independent    Follow up appointments reviewed:    PCP Hospital f/u appt confirmed?[facility]2020 @ 1050.   [facility] f/u appt confirmed? Pulmonary scheduled on 01/28/21  Are transportation arrangements needed? He has transportation    If their condition worsens, is the pt aware to call  their PCP or go to the ED? Yes.Made pt aware if condition worsen or start experiencing rapid weight gain, chest pain, diff breathing, SOB, high fevers, or bleading to refer imediately to ED for further evaluation.   Was the patient provided with contact information for the PCP's office or ED? He has the phone number   Was the pt encouraged to call back with questions or concerns?yes

## 2021-01-12 NOTE — Telephone Encounter (Signed)
Holly, please advise if this has been completed and the encounter can be closed. Thanks.

## 2021-01-13 NOTE — Telephone Encounter (Signed)
Yes this was taken care of.

## 2021-01-26 ENCOUNTER — Ambulatory Visit: Payer: MEDICAID | Attending: Internal Medicine | Admitting: Internal Medicine

## 2021-01-26 ENCOUNTER — Encounter: Payer: Self-pay | Admitting: Internal Medicine

## 2021-01-26 ENCOUNTER — Other Ambulatory Visit: Payer: Self-pay

## 2021-01-26 VITALS — BP 120/80 | HR 88 | Resp 16 | Wt 161.4 lb

## 2021-01-26 DIAGNOSIS — R918 Other nonspecific abnormal finding of lung field: Secondary | ICD-10-CM

## 2021-01-26 DIAGNOSIS — R634 Abnormal weight loss: Secondary | ICD-10-CM | POA: Diagnosis not present

## 2021-01-26 DIAGNOSIS — Z09 Encounter for follow-up examination after completed treatment for conditions other than malignant neoplasm: Secondary | ICD-10-CM

## 2021-01-26 DIAGNOSIS — Z9109 Other allergy status, other than to drugs and biological substances: Secondary | ICD-10-CM

## 2021-01-26 MED ORDER — OLOPATADINE HCL 0.1 % OP SOLN: 1.0000 [drp] | Freq: Two times a day (BID) | OPHTHALMIC | 0 refills | Status: AC

## 2021-01-26 NOTE — Progress Notes (Signed)
Patient ID: Joseph Fitzgerald, male    DOB: September 07, 1971  MRN: [id number]  CC: Transition of care visit Date of hospitalization: 2/2-12/22 Date of call from RN: 01/11/2021  Subjective: Joseph Fitzgerald is a 50 y.o. male who presents for [facility] visit. His concerns today include:  Pt with mod persistent asthma, obesity, environmental allergiesand eczema.  Patient hospitalized with left-sided chest pain. Found to have increasing left lower lobe mass and a loculated pleural effusion. CTA negative for PE. CT of the chest showed persistent consolidation with necrosis in the left lower lobe, mediastinal lymphadenopathy and diffuse volume loculated pleural effusion. Patient evaluated by pulmonary. Found to be Covid positive.  Chest tube was placed. Treated with remdesivir for 3 days. Also treated with antibiotics including Levaquin, Rocephin and Flagyl. Blood cultures negative. Pleural fluid cytology negative for malignancy, did show inflammatory changes. AFB smear negative. AFB culture still pending. Fluid culture negative. Fluid fungal cultures negative. Overall assessment was that he had bacterial infection from poor dental hygiene. Underwent daily fibrinolytic therapy through the chest tube. Deemed not to be a candidate for VATS procedure. Chest tube discontinued on 01/08/2021.  Today: Patient reports he is doing okay. He denies any increased chest pain or cough. No fever. He has had 2 shots of COVID-19 vaccines since I last saw him. He has had 7 pounds weight loss unintentionally since the beginning of January. He reports good appetite. He eats 3 meals a day. He walks 2-3 times a week for 30 minutes. He has appointment with pulmonary later this week.  Complains of itchy watery eyes over the past 1 to 2 weeks. Patient Active Problem List   Diagnosis Date Noted  . Chest tube in place   . COVID-19 virus infection 12/31/2020  . Loculated pleural effusion 12/31/2020  . Asthma   . Poor dentition   . Aortic  atherosclerosis ([place])   . Mass of lower lobe of left lung   . Marijuana abuse   . Tobacco abuse   . Dental caries 12/07/2020  . Diabetes mellitus without complication ([place])   . Hypokalemia 03/17/2018  . Hyperglycemia 03/17/2018  . Asthma, moderate persistent 08/17/2016  . Vitamin D deficiency 09/01/2015  . Family history of diabetes mellitus in brother 08/31/2015  . Eczema 08/31/2015  . Environmental allergies 04/16/2014     Current Outpatient Medications on File Prior to Visit  Medication Sig Dispense Refill  . albuterol (VENTOLIN HFA) 108 (90 Base) MCG/ACT inhaler Inhale 2 puffs into the lungs every 4 (four) hours as needed for wheezing or shortness of breath. 18 g 0  . cefdinir (OMNICEF) 300 MG capsule Take 1 capsule (300 mg total) by mouth every 12 (twelve) hours for 21 days. 42 capsule 0  . fluticasone furoate-vilanterol (BREO ELLIPTA) 200-25 MCG/INH AEPB Inhale 1 puff into the lungs daily. 60 each 4  . HYDROcodone-acetaminophen (NORCO/VICODIN) 5-325 MG tablet Take 1-2 tablets by mouth every 6 (six) hours as needed for severe pain. 10 tablet 0  . ipratropium-albuterol (DUONEB) 0.5-2.5 (3) MG/3ML SOLN Take 3 mLs by nebulization every 6 (six) hours. (Patient not taking: Reported on 12/31/2020) 360 mL 1   No current facility-administered medications on file prior to visit.    Allergies  Allergen Reactions  . Penicillins Rash    Did it involve swelling of the face/tongue/throat, SOB, or low BP?No Did it involve sudden or severe rash/hives, skin peeling, or any reaction on the inside of your mouth or nose?Yes Did you need to seek medical attention  at a hospital or doctor's office? no When did it last happen?Child If all above answers are "NO", may proceed with cephalosporin use.    Social History   Socioeconomic History  . Marital status: Single    Spouse name: Not on file  . Number of children: 3  . Years of education: 72  . Highest education level: Not on file   Occupational History  . Occupation: Cook   Tobacco Use  . Smoking status: Never Smoker  . Smokeless tobacco: Former Systems developer    Types: Secondary school teacher  . Vaping Use: Never used  Substance and Sexual Activity  . Alcohol use: Not Currently    Comment: beer  . Drug use: Not Currently    Types: Marijuana  . Sexual activity: Yes  Other Topics Concern  . Not on file  Social History Narrative  . Not on file   Social Determinants of Health   Financial Resource Strain: Not on file  Food Insecurity: Not on file  Transportation Needs: Not on file  Physical Activity: Not on file  Stress: Not on file  Social Connections: Not on file  Intimate Partner Violence: Not on file    Family History  Problem Relation Age of Onset  . Asthma Brother   . Diabetes Brother     Past Surgical History:  Procedure Laterality Date  . NO PAST SURGERIES      ROS: Review of Systems Negative except as stated above  PHYSICAL EXAM: BP 120/80   Pulse 88   Resp 16   Wt 161 lb 6.4 oz (73.2 kg)   SpO2 95%   BMI 31.52 kg/m   Wt Readings from Last 3 Encounters:  01/26/21 161 lb 6.4 oz (73.2 kg)  01/02/21 169 lb 12.1 oz (77 kg)  12/07/20 168 lb 9.6 oz (76.5 kg)    Physical Exam General appearance - alert, well appearing, and in no distress Mental status - normal mood, behavior, speech, dress, motor activity, and thought processes Eyes -mild bilateral conjunctival injection. No drainage noted.  Chest -patient in no acute respiratory distress. Breath sounds decreased at the left lung base. Other lung fields are clear without wheezes or crackles. Heart - normal rate, regular rhythm, normal S1, S2, no murmurs, rubs, clicks or gallops Extremities - peripheral pulses normal, no pedal edema, no clubbing or cyanosis  CMP Latest Ref Rng & Units 01/07/2021 01/05/2021 01/04/2021  Glucose 70 - 99 mg/dL 118(H) 124(H) 107(H)  BUN 6 - 20 mg/dL 7 10 8   Creatinine 0.61 - 1.24 mg/dL 0.67 0.72 0.84  Sodium 135 - 145  mmol/L 138 135 136  Potassium 3.5 - 5.1 mmol/L 3.8 3.4(L) 3.3(L)  Chloride 98 - 111 mmol/L 103 100 100  CO2 22 - 32 mmol/L 25 26 27   Calcium 8.9 - 10.3 mg/dL 8.1(L) 7.8(L) 7.7(L)  Total Protein 6.5 - 8.1 g/dL - 6.3(L) 6.1(L)  Total Bilirubin 0.3 - 1.2 mg/dL - 0.5 0.7  Alkaline Phos 38 - 126 U/L - 56 59  AST 15 - 41 U/L - 22 20  ALT 0 - 44 U/L - 16 16   Lipid Panel     Component Value Date/Time   CHOL 149 12/31/2020 0906   CHOL 196 01/04/2018 1540   TRIG 32 12/31/2020 0906   HDL 39 (L) 12/31/2020 0906   HDL 44 01/04/2018 1540   CHOLHDL 3.8 12/31/2020 0906   VLDL 6 12/31/2020 0906   LDLCALC 104 (H) 12/31/2020 0906   LDLCALC 114 (  H) 01/04/2018 1540    CBC    Component Value Date/Time   WBC 8.2 01/07/2021 0756   RBC 4.41 01/07/2021 0756   HGB 13.4 01/07/2021 0756   HGB 15.7 10/19/2020 1012   HCT 39.1 01/07/2021 0756   HCT 46.6 10/19/2020 1012   PLT 396 01/07/2021 0756   PLT 207 01/04/2018 1540   MCV 88.7 01/07/2021 0756   MCV 91 10/19/2020 1012   MCH 30.4 01/07/2021 0756   MCHC 34.3 01/07/2021 0756   RDW 13.4 01/07/2021 0756   RDW 12.1 10/19/2020 1012   LYMPHSABS 1.0 01/05/2021 0052   LYMPHSABS 2.4 10/19/2020 1012   MONOABS 1.3 (H) 01/05/2021 0052   EOSABS 0.1 01/05/2021 0052   EOSABS 0.5 (H) 10/19/2020 1012   BASOSABS 0.0 01/05/2021 0052   BASOSABS 0.1 10/19/2020 1012    ASSESSMENT AND PLAN: 1. Hospital discharge follow-up   2. Mass of lower lobe of left lung Patient to keep upcoming appointment with pulmonary. - CBC - Comprehensive metabolic panel  3. Environmental allergies - olopatadine (PATANOL) 0.1 % ophthalmic solution; Place 1 drop into both eyes 2 (two) times daily.  Dispense: 5 mL; Refill: 0  4. Weight loss, unintentional Likely related to recent acute illness. - TSH   Patient was given the opportunity to ask questions.  Patient verbalized understanding of the plan and was able to repeat key elements of the plan.   No orders of the defined  types were placed in this encounter.    Requested Prescriptions    No prescriptions requested or ordered in this encounter    No follow-ups on file.  Karle Plumber, MD, FACP

## 2021-01-27 ENCOUNTER — Other Ambulatory Visit: Payer: Self-pay | Admitting: *Deleted

## 2021-01-27 DIAGNOSIS — J4540 Moderate persistent asthma, uncomplicated: Secondary | ICD-10-CM

## 2021-01-27 DIAGNOSIS — J189 Pneumonia, unspecified organism: Secondary | ICD-10-CM

## 2021-01-27 LAB — COMPREHENSIVE METABOLIC PANEL
ALT: 26 IU/L (ref 0–44)
AST: 23 IU/L (ref 0–40)
Albumin/Globulin Ratio: 1 — ABNORMAL LOW (ref 1.2–2.2)
Albumin: 4.1 g/dL (ref 4.0–5.0)
Alkaline Phosphatase: 128 IU/L — ABNORMAL HIGH (ref 44–121)
BUN/Creatinine Ratio: 13 (ref 9–20)
BUN: 10 mg/dL (ref 6–24)
Bilirubin Total: 0.4 mg/dL (ref 0.0–1.2)
CO2: 22 mmol/L (ref 20–29)
Calcium: 9.7 mg/dL (ref 8.7–10.2)
Chloride: 102 mmol/L (ref 96–106)
Creatinine, Ser: 0.8 mg/dL (ref 0.76–1.27)
Globulin, Total: 4.2 g/dL (ref 1.5–4.5)
Glucose: 77 mg/dL (ref 65–99)
Potassium: 4.6 mmol/L (ref 3.5–5.2)
Sodium: 141 mmol/L (ref 134–144)
Total Protein: 8.3 g/dL (ref 6.0–8.5)
eGFR: 110 mL/min/{1.73_m2} (ref >59)

## 2021-01-27 LAB — CBC
Hematocrit: 44.4 % (ref 37.5–51.0)
Hemoglobin: 14.9 g/dL (ref 13.0–17.7)
MCH: 29.6 pg (ref 26.6–33.0)
MCHC: 33.6 g/dL (ref 31.5–35.7)
MCV: 88 fL (ref 79–97)
Platelets: 246 10*3/uL (ref 150–450)
RBC: 5.04 x10E6/uL (ref 4.14–5.80)
RDW: 15.2 % (ref 11.6–15.4)
WBC: 5.2 10*3/uL (ref 3.4–10.8)

## 2021-01-27 LAB — TSH: TSH: 1.61 u[IU]/mL (ref 0.450–4.500)

## 2021-01-28 ENCOUNTER — Encounter: Payer: Self-pay | Admitting: Adult Health

## 2021-01-28 ENCOUNTER — Ambulatory Visit: Payer: 59 | Admitting: Adult Health

## 2021-01-28 ENCOUNTER — Other Ambulatory Visit: Payer: Self-pay

## 2021-01-28 ENCOUNTER — Ambulatory Visit (INDEPENDENT_AMBULATORY_CARE_PROVIDER_SITE_OTHER): Payer: 59

## 2021-01-28 VITALS — BP 124/80 | HR 80 | Temp 98.1°F | Ht 61.0 in | Wt 161.2 lb

## 2021-01-28 DIAGNOSIS — J189 Pneumonia, unspecified organism: Secondary | ICD-10-CM | POA: Diagnosis not present

## 2021-01-28 DIAGNOSIS — K089 Disorder of teeth and supporting structures, unspecified: Secondary | ICD-10-CM | POA: Diagnosis not present

## 2021-01-28 DIAGNOSIS — J90 Pleural effusion, not elsewhere classified: Secondary | ICD-10-CM | POA: Diagnosis not present

## 2021-01-28 DIAGNOSIS — U071 COVID-19: Secondary | ICD-10-CM | POA: Diagnosis not present

## 2021-01-28 DIAGNOSIS — J4540 Moderate persistent asthma, uncomplicated: Secondary | ICD-10-CM

## 2021-01-28 DIAGNOSIS — R918 Other nonspecific abnormal finding of lung field: Secondary | ICD-10-CM

## 2021-01-28 NOTE — Assessment & Plan Note (Signed)
Wound andLeft loculated pleural effusion/empyema.  Patient with recent hospitalization with chest tube placement.  And prolonged antibiotics patient has 1 additional day of antibiotics left.  Clinically he has improved.  Have encouraged him to follow-up with a dentist as he has very poor dentition,  Check chest x-ray today.  Will need a follow-up CT going forward.  Plan  Patient Instructions  Finish antibiotics as directed Chest x-ray today Follow-up with dentist as discussed Follow up in 3-4 weeks with chest xray Dr. Valeta Harms and As needed    Please contact office for sooner follow up if symptoms do not improve or worsen or seek emergency care

## 2021-01-28 NOTE — Assessment & Plan Note (Signed)
Recent COVID-19 infection appears to be clinically resolved.

## 2021-01-28 NOTE — Progress Notes (Signed)
@Patient  ID: Joseph Fitzgerald, male    DOB: 01/13/1971, 50 y.o.   MRN: [id number]  Chief Complaint  Patient presents with  . Hospitalization Follow-up    Referring provider: Ladell Pier, MD  HPI: 50 year old male never smoker seen for pulmonary consult November 09, 2020 for lung mass [place]  - Speaks english and [place] . In [place] 1998 .   TEST/EVENTS :  CT October 14, 2020 CT chest measured a 5.6 x 5.5 masslike density with early suggestion of cavitation.  Enlarged left hilar subcarinal nodes  01/28/2021 Follow up: Lung mass Patient presents for a follow-up visit.  Patient was seen for a pulmonary consult in December for lung mass on CT scan.  Patient was initially seen at urgent care for respiratory symptoms.  Subsequent CT chest showed a 5.6 masslike density in the left lower lobe  with internal air loculations and suggestion of cavitation.  There was also enlarged left hilar and subcarinal lymph nodes.  He was initially treated with amoxicillin 2000 mg 3 times daily for 7 days and azithromycin for 5 days.  This was felt to be an infectious source.  He finished all his antibiotics and had a follow-up scan in January 2022 that showed a decrease in the consolidation measuring 5.5 x 3.5 cm.  He felt to be having a resolving left lower lobe pneumonia.  At last office visit patient was recommended to have a follow-up chest x-ray in 4 weeks.  A referral to dentist for poor dentition was placed.  Since last visit patient says he was admitted to the hospital early February.  He had pleuritic chest pain.  On arrival to the emergency room was found to have an increasing left lower lobe mass and loculated pleural effusion.  He was seen by pulmonary and a chest tube was placed.  Required intrapleural fibrinolytics.  CT chest was negative for PE.  Showed a complex loculated pleural effusion with increased left lower lobe mass.  Venous Dopplers were negative for DVT.  Follow-up  CT chest showed diffuse volume and loculated pleural effusion persistent consolidation with necrosis in the left lower lobe.  Along with mediastinal lymphadenopathy.  COVID-19 testing was positive.  Patient did receive remdesivir x3 days.  He was treated with aggressive IV antibiotics.  Blood cultures were negative.  Pleural fluid AFB negative pleural fluid culture negative pleural fluid fungal culture negative pleural fluid AFB negative but final cultures pending   Pleural fluid studies show glucose<20, 90% neutrophils, pH 6.7 diagnostic of empyema.   Patient was recommended for prolonged antibiotics with Omnicef for 3 weeks. Since discharge patient is feeling better.  He has decreased cough.  No further pleuritic pain.  He says his appetite is good.  Eating well.  Has no nausea vomiting or diarrhea  Patient is a cook .  Married.4  Kids- ages 65 to  58   Does not smoke cigarettes . Smokes marajuana .   Allergies  Allergen Reactions  . Penicillins Rash    Did it involve swelling of the face/tongue/throat, SOB, or low BP?No Did it involve sudden or severe rash/hives, skin peeling, or any reaction on the inside of your mouth or nose?Yes Did you need to seek medical attention at a hospital or doctor's office? no When did it last happen?Child If all above answers are "NO", may proceed with cephalosporin use.    Immunization History  Administered Date(s) Administered  . Influenza Split 09/03/2013  . Influenza,inj,Quad PF,6+  Mos 01/22/2015, 08/31/2015, 08/17/2016, 08/25/2017, 09/13/2018, 09/10/2019, 11/13/2020  . PFIZER(Purple Top)SARS-COV-2 Vaccination 12/12/2020, 01/12/2021  . Pneumococcal Polysaccharide-23 06/13/2013, 09/10/2019  . Tdap 10/28/2016    Past Medical History:  Diagnosis Date  . Aortic atherosclerosis ([place])   . Asthma   . Marijuana abuse   . Mass of lower lobe of left lung   . Poor dentition   . Tobacco abuse     Tobacco History: Social History   Tobacco Use   Smoking Status Never Smoker  Smokeless Tobacco Former Systems developer  . Types: Chew   Counseling given: Not Answered   Outpatient Medications Prior to Visit  Medication Sig Dispense Refill  . albuterol (VENTOLIN HFA) 108 (90 Base) MCG/ACT inhaler Inhale 2 puffs into the lungs every 4 (four) hours as needed for wheezing or shortness of breath. 18 g 0  . cefdinir (OMNICEF) 300 MG capsule Take 1 capsule (300 mg total) by mouth every 12 (twelve) hours for 21 days. 42 capsule 0  . fluticasone furoate-vilanterol (BREO ELLIPTA) 200-25 MCG/INH AEPB Inhale 1 puff into the lungs daily. 60 each 4  . HYDROcodone-acetaminophen (NORCO/VICODIN) 5-325 MG tablet Take 1-2 tablets by mouth every 6 (six) hours as needed for severe pain. 10 tablet 0  . olopatadine (PATANOL) 0.1 % ophthalmic solution Place 1 drop into both eyes 2 (two) times daily. 5 mL 0  . ipratropium-albuterol (DUONEB) 0.5-2.5 (3) MG/3ML SOLN Take 3 mLs by nebulization every 6 (six) hours. (Patient not taking: No sig reported) 360 mL 1   No facility-administered medications prior to visit.     Review of Systems:   Constitutional:   No  weight loss, night sweats,  Fevers, chills,  +fatigue, or  lassitude.  HEENT:   No headaches,  Difficulty swallowing,  Tooth/dental problems, or  Sore throat,                No sneezing, itching, ear ache, nasal congestion, post nasal drip,   CV:  No chest pain,  Orthopnea, PND, swelling in lower extremities, anasarca, dizziness, palpitations, syncope.   GI  No heartburn, indigestion, abdominal pain, nausea, vomiting, diarrhea, change in bowel habits, loss of appetite, bloody stools.   Resp:   No chest wall deformity  Skin: no rash or lesions.  GU: no dysuria, change in color of urine, no urgency or frequency.  No flank pain, no hematuria   MS:  No joint pain or swelling.  No decreased range of motion.  No back pain.    Physical Exam  BP 124/80 (BP Location: Left Arm, Patient Position: Sitting, Cuff  Size: Normal)   Pulse 80   Temp 98.1 F (36.7 C) (Temporal)   Ht 5' 1"  (1.549 m)   Wt 161 lb 3.2 oz (73.1 kg)   SpO2 98%   BMI 30.46 kg/m   GEN: A/Ox3; pleasant , NAD, well nourished    HEENT:  [place]/AT,  N no lesions, no postnasal drip or exudate noted.  Very poor dentition  NECK:  Supple w/ fair ROM; no JVD; normal carotid impulses w/o bruits; no thyromegaly or nodules palpated; no lymphadenopathy.    RESP  Clear  P & A; w/o, wheezes/ rales/ or rhonchi. no accessory muscle use, no dullness to percussion  CARD:  RRR, no m/r/g, no peripheral edema, pulses intact, no cyanosis or clubbing.  GI:   Soft & nt; nml bowel sounds; no organomegaly or masses detected.   Musco: Warm bil, no deformities or joint swelling noted.   Neuro: alert,  no focal deficits noted.    Skin: Warm, no lesions or rashes    Lab Results:    BNP No results found for: BNP  ProBNP No results found for: PROBNP  Imaging: CT CHEST WO CONTRAST  Result Date: 01/04/2021 CLINICAL DATA:  50 year old with loculated left pleural effusion and chest tube. Patient has undergone pleural fibrinolytic therapy. EXAM: CT CHEST WITHOUT CONTRAST TECHNIQUE: Multidetector CT imaging of the chest was performed following the standard protocol without IV contrast. COMPARISON:  Chest CT 12/31/2020 and chest radiograph 01/03/2021 and chest CT 12/01/2020 FINDINGS: Cardiovascular: Normal caliber of the thoracic aorta without atherosclerotic calcifications. Small pericardial effusion which appears to be slightly increased. Heart size is normal. Mediastinum/Nodes: Prominent subcarinal tissue measuring up to 1.8 cm in short axis on sequence 3, image 27 and similar to the recent comparison examination. However, subcarinal tissue only measured 1.1 cm in the short axis on 12/01/2020. Prominent right paratracheal lymph node measuring 0.9 cm in the short axis on sequence 3, image 20. Again noted are multiple small left axillary lymph nodes.  Lungs/Pleura: Left posterior pigtail drain in the pleural space. The drain was placed along the medial aspect of the left lower chest. Overall, the volume of pleural fluid has decreased, particularly in the left lower chest. There is a small amount of residual pleural thickening or pleural fluid in the left lower chest. Residual loculated pleural fluid in the lingular region. Multiple pockets of pleural air. Residual small pockets of loculated pleural fluid near the superior segment of the left lower lobe. Persistent area of consolidation in the left lower lobe with evidence of central necrosis on sequence 6, image 81. This area roughly measures 4.8 x 4.9 cm. This area roughly measured 5.5 x 3.5 cm on 12/01/2020. Patchy densities throughout the left lower lung compatible with areas of consolidation and volume loss. Right lung is clear. Negative for a right pleural effusion. Upper Abdomen: Images of the upper abdomen are unremarkable. Musculoskeletal: No acute bone abnormality. IMPRESSION: 1. Loculated left pleural effusion has decreased in volume. There continues to be scattered areas of loculated left pleural fluid with pockets of pleural air. Chest tube is positioned in the medial posterior left chest. 2. Persistent consolidation with necrosis in the left lower lobe. This area has persisted since 12/01/2020. Findings are concerning for necrotic pneumonia but an underlying malignancy can not be excluded. 3. Mediastinal lymphadenopathy. This lymphadenopathy has not significantly changed since the recent comparison examination but has increased since 12/01/2020. This lymphadenopathy could be reactive but indeterminate. 4. Small but slightly increased pericardial effusion. Electronically Signed   By: Markus Daft M.D.   On: 01/04/2021 08:30   CT ANGIO CHEST PE W OR WO CONTRAST  Result Date: 12/31/2020 CLINICAL DATA:  Shortness of breath EXAM: CT ANGIOGRAPHY CHEST WITH CONTRAST TECHNIQUE: Multidetector CT imaging of  the chest was performed using the standard protocol during bolus administration of intravenous contrast. Multiplanar CT image reconstructions and MIPs were obtained to evaluate the vascular anatomy. CONTRAST:  134m OMNIPAQUE IOHEXOL 350 MG/ML SOLN COMPARISON:  CT 12/01/2020, 10/14/2020 FINDINGS: Cardiovascular: Satisfactory opacification the pulmonary arteries to the segmental level. No pulmonary artery filling defects are identified. Central pulmonary arteries are top-normal caliber. Cardiac size is top normal. No sizable pericardial effusion. There is however some inflammation in the adjacent paracardial/mediastinal fat which is likely secondary to the it extensive process in the left lung. The aorta is normal caliber. No acute luminal abnormality of the imaged aorta. No periaortic stranding or  hemorrhage. Normal 3 vessel branching of the aortic arch. Proximal great vessels are unremarkable. No major venous abnormalities are seen. Mediastinum/Nodes: Stranding and thickening in the left anterior mediastinal/paracardial fat. No mediastinal free fluid or air. Normal thyroid gland and thoracic inlet. No acute abnormality of the trachea or esophagus. Scattered low-attenuation subcentimeter mediastinal and hilar adenopathy. Several larger subcarinal and left hilar nodes are present measuring up to, 1.5 cm and 1.2 cm respectively (7/189), quite similar to prior. There is some increasing left axillary adenopathy is present as well with some mild surrounding stranding which could reflect a reactive process. Lungs/Pleura: Increasing consolidative opacity centered in the left lower with hypoattenuation of the consolidated parenchyma suggestive of some underlying infection with scattered air lucencies, possibly developing cavitation (6/77). Development of a complex, loculated pleural effusion extending over the lung apex, into the fissures, and throughout the lung base. Suspect some reactive stranding of the adjacent  mediastinal fat as detailed above. Several areas suggest some associated pleural thickening which could reflect a developing empyema. Right lung remains essentially clear without visible right pleural effusion. No pneumothorax. Upper Abdomen: No acute abnormalities present in the visualized portions of the upper abdomen. Musculoskeletal: No acute osseous abnormality or suspicious osseous lesion. Multilevel degenerative changes are present in the imaged portions of the spine. No clear involvement of the chest wall. No erosive or destructive changes of the adjacent ribs. No worrisome chest wall masses or lesions. Review of the MIP images confirms the above findings. IMPRESSION: 1. No pulmonary embolism is seen. 2. Increasing consolidative opacity centered in the left lower with hypoattenuation of the consolidated parenchyma suggestive of some underlying infection with scattered air lucencies, possibly developing cavitation or necrosis. Additional development of a complex, loculated pleural effusion extending over the lung apex, into the fissure, and throughout the lung base. Several areas suggest some associated pleural thickening/empyema. Continued follow-up to resolution is recommended as an underlying mass/malignancy is difficult to fully exclude. 3. Some increasing scattered subcentimeter hypoattenuating mediastinal, hilar and left axillary adenopathy, may be reactive though continued attention on follow-up imaging is warranted pending exclusion of malignancy. These results were called by telephone at the time of interpretation on 12/31/2020 at 6:16 am to provider [facility] , who verbally acknowledged these results. Electronically Signed   By: Lovena Le M.D.   On: 12/31/2020 06:17   DG Chest Port 1 View  Result Date: 01/09/2021 CLINICAL DATA:  Status post chest tube removal EXAM: PORTABLE CHEST 1 VIEW COMPARISON:  January 08, 2021 FINDINGS: Chest tube has been removed. No pneumothorax. There is loculated  pleural fluid with consolidation left base. There is also atelectatic change in this area. The right lung is clear. Heart size and pulmonary vascularity are normal. No adenopathy. No bone lesions. IMPRESSION: Chest tube has been removed without pneumothorax. Combination of loculated pleural effusion and consolidation left base remains. Nearby atelectasis is stable. Lungs elsewhere clear. Stable cardiac silhouette. Electronically Signed   By: Lowella Grip III M.D.   On: 01/09/2021 09:29   DG Chest Port 1 View  Result Date: 01/08/2021 CLINICAL DATA:  50 year old male with loculated left pleural effusion, pleural fibrinolytic therapy. EXAM: PORTABLE CHEST 1 VIEW COMPARISON:  Portable chest 01/06/2021 and earlier. FINDINGS: Portable AP semi upright view at 0446 hours. Left chest tube remains stable. Larger lung volumes. Patchy and confluent left lung base opacity appears stable since the CT on 01/04/2021 (please see that report) no pneumothorax identified. Mediastinal contour remains normal. Right lung remains negative. Negative bowel  gas. Stable visualized osseous structures. IMPRESSION: Stable since the CT on 01/04/2021 (please see that report). No new cardiopulmonary abnormality. Electronically Signed   By: Genevie Ann M.D.   On: 01/08/2021 07:19   DG Chest Port 1 View  Result Date: 01/06/2021 CLINICAL DATA:  Follow-up left chest tube placement EXAM: PORTABLE CHEST 1 VIEW COMPARISON:  01/05/2021 FINDINGS: Pigtail catheter is again noted over the left lung base. Persistent atelectatic changes are seen. No sizable effusion is noted. No pneumothorax is seen. Right lung remains clear. Cardiac shadow is stable. IMPRESSION: No significant recurrent pleural effusion is noted. Persistent left basilar atelectasis is seen. Electronically Signed   By: Inez Catalina M.D.   On: 01/06/2021 11:14   DG Chest Port 1 View  Result Date: 01/05/2021 CLINICAL DATA:  Chest tube placement for empyema EXAM: PORTABLE CHEST 1 VIEW  COMPARISON:  Chest radiograph January 03, 2021 and chest CT January 04, 2021 FINDINGS: Pigtail catheter present on the left inferomedially, stable. Ill-defined areas of airspace opacity in the left lower lung region appear essentially stable. There are areas of questionable cavitation in this area, better seen on recent CT. There is ill-defined opacity in the left upper and mid lung regions, slightly less than on recent studies. Right lung is clear. Heart size and pulmonary vascularity are normal. No adenopathy. No bone lesions. IMPRESSION: Stable catheter position inferomedially on the left. No pneumothorax. Airspace consolidation with areas of questionable cavitation in this region, better delineated on recent CT. Slightly less opacity left upper and mid lung regions. No new opacities evident. Right lung clear. Stable cardiac silhouette. Electronically Signed   By: Lowella Grip III M.D.   On: 01/05/2021 09:57   DG CHEST PORT 1 VIEW  Result Date: 01/03/2021 CLINICAL DATA:  Chest pain. EXAM: PORTABLE CHEST 1 VIEW COMPARISON:  January 02, 2021. FINDINGS: Stable cardiomegaly. Right lung is clear. Stable position of left-sided chest tube is noted with grossly stable left pleural effusion and associated atelectasis or infiltrate. Bony thorax is unremarkable. No pneumothorax is noted. IMPRESSION: Stable position of left-sided chest tube with grossly stable left pleural effusion and associated atelectasis or infiltrate. Electronically Signed   By: Marijo Conception M.D.   On: 01/03/2021 08:38   DG Chest Port 1 View  Result Date: 01/02/2021 CLINICAL DATA:  Shortness of breath, COVID-19 positive. EXAM: PORTABLE CHEST 1 VIEW COMPARISON:  January 01, 2021. FINDINGS: Stable cardiomegaly. No pneumothorax is noted. Stable position of left-sided chest tube. Right lung is clear. Loculated left pleural effusion noted on prior exam is significantly smaller currently. Stable left basilar atelectasis or infiltrate is noted.  Bony thorax is unremarkable. IMPRESSION: Stable position of left-sided chest tube. Loculated left pleural effusion noted on prior exam is significantly smaller currently. Stable left basilar atelectasis or infiltrate is noted. Electronically Signed   By: Marijo Conception M.D.   On: 01/02/2021 09:53   DG CHEST PORT 1 VIEW  Result Date: 01/01/2021 CLINICAL DATA:  Pleural effusion, chest tube, COVID pneumonia EXAM: PORTABLE CHEST 1 VIEW COMPARISON:  12/31/2020 FINDINGS: Large, partially laterally loculated left pleural effusion has enlarged in size. Left basilar pigtail chest tube positioned inferomedially is unchanged. Interval development of mild mediastinal shift to the right. Right lung is clear. No pneumothorax. No pleural effusion on the right. Cardiac size within normal limits. IMPRESSION: Left chest tube unchanged. Progressive enlargement of a partially loculated left pleural effusion with developing mediastinal shift to the right. CT examination may be helpful to assess catheter  position in relation to the developing pleural fluid. Electronically Signed   By: Fidela Salisbury MD   On: 01/01/2021 08:15   DG CHEST PORT 1 VIEW  Result Date: 12/31/2020 CLINICAL DATA:  Loculated pleural effusion. EXAM: PORTABLE CHEST 1 VIEW COMPARISON:  CT 12/31/2020.  Chest x-ray 12/29/2020. FINDINGS: Left chest tube noted over the left lower chest. Prominent left pleural effusion with possible loculation. Underlying left base atelectasis/infiltrate most likely present. No pneumothorax. Heart size stable. IMPRESSION: Left chest tube noted over the left lower chest. Prominent left pleural effusion with possible loculation. Underlying left base atelectasis/infiltrate most likely present. No pneumothorax. Electronically Signed   By: Marcello Moores  Register   On: 12/31/2020 11:43   VAS [place] LOWER EXTREMITY VENOUS (DVT)  Result Date: 01/03/2021  Lower Venous DVT Study Indications: Covid, elevated d-dimer.  Anticoagulation: Lovenox.  Comparison Study: No prior studies. Performing Technologist: Darlin Coco RDMS  Examination Guidelines: A complete evaluation includes B-mode imaging, spectral Doppler, color Doppler, and power Doppler as needed of all accessible portions of each vessel. Bilateral testing is considered an integral part of a complete examination. Limited examinations for reoccurring indications may be performed as noted. The reflux portion of the exam is performed with the patient in reverse Trendelenburg.  +---------+---------------+---------+-----------+----------+--------------+ RIGHT    CompressibilityPhasicitySpontaneityPropertiesThrombus Aging +---------+---------------+---------+-----------+----------+--------------+ CFV      Full           Yes      Yes                                 +---------+---------------+---------+-----------+----------+--------------+ SFJ      Full                                                        +---------+---------------+---------+-----------+----------+--------------+ FV Prox  Full                                                        +---------+---------------+---------+-----------+----------+--------------+ FV Mid   Full                                                        +---------+---------------+---------+-----------+----------+--------------+ FV DistalFull                                                        +---------+---------------+---------+-----------+----------+--------------+ PFV      Full                                                        +---------+---------------+---------+-----------+----------+--------------+ POP      Full  Yes      Yes                                 +---------+---------------+---------+-----------+----------+--------------+ PTV      Full                                                        +---------+---------------+---------+-----------+----------+--------------+ PERO      Full                                                        +---------+---------------+---------+-----------+----------+--------------+   +---------+---------------+---------+-----------+----------+--------------+ LEFT     CompressibilityPhasicitySpontaneityPropertiesThrombus Aging +---------+---------------+---------+-----------+----------+--------------+ CFV      Full           Yes      Yes                                 +---------+---------------+---------+-----------+----------+--------------+ SFJ      Full                                                        +---------+---------------+---------+-----------+----------+--------------+ FV Prox  Full                                                        +---------+---------------+---------+-----------+----------+--------------+ FV Mid   Full                                                        +---------+---------------+---------+-----------+----------+--------------+ FV DistalFull                                                        +---------+---------------+---------+-----------+----------+--------------+ PFV      Full                                                        +---------+---------------+---------+-----------+----------+--------------+ POP      Full           Yes      Yes                                 +---------+---------------+---------+-----------+----------+--------------+ PTV  Full                                                        +---------+---------------+---------+-----------+----------+--------------+ PERO     Full                                                        +---------+---------------+---------+-----------+----------+--------------+     Summary: RIGHT: - There is no evidence of deep vein thrombosis in the lower extremity.  - No cystic structure found in the popliteal fossa.  LEFT: - There is no evidence of deep vein thrombosis in the  lower extremity.  - No cystic structure found in the popliteal fossa.  *See table(s) above for measurements and observations. Electronically signed by Monica Martinez MD on 01/03/2021 at 2:28:49 PM.    Final       No flowsheet data found.  No results found for: NITRICOXIDE      Assessment & Plan:   No problem-specific Assessment & Plan notes found for this encounter.     Rexene Edison, NP 01/28/2021

## 2021-01-28 NOTE — Assessment & Plan Note (Signed)
Left lower lobe lung mass with empyema.  Will need serial follow-up with chest x-ray and CT imaging.  Patient is clinically improving.  We will continue to follow after antibiotic course.

## 2021-01-28 NOTE — Patient Instructions (Addendum)
Finish antibiotics as directed Chest x-ray today Follow-up with dentist as discussed Follow up in 3-4 weeks with chest xray Dr. Valeta Harms and As needed    Please contact office for sooner follow up if symptoms do not improve or worsen or seek emergency care

## 2021-01-28 NOTE — Assessment & Plan Note (Signed)
Patient has been recommended for dental follow-up.

## 2021-01-29 NOTE — Progress Notes (Signed)
Duplicate result note, see previous note.

## 2021-01-29 NOTE — Progress Notes (Signed)
Called and spoke with patient, advised of results/recommendations per Rexene Edison NP.  He verbalized understanding.  Nothing further needed.

## 2021-01-29 NOTE — Addendum Note (Signed)
Addended by: Vanessa Barbara on: 01/29/2021 11:11 AM   Modules accepted: Orders

## 2021-02-01 NOTE — Progress Notes (Signed)
PCCM:  Thanks for seeing him   [name], DO [facility] 02/01/2021 7:41 AM

## 2021-02-09 LAB — FUNGAL ORGANISM REFLEX

## 2021-02-09 LAB — FUNGUS CULTURE RESULT

## 2021-02-09 LAB — FUNGUS CULTURE WITH STAIN

## 2021-02-11 ENCOUNTER — Other Ambulatory Visit: Payer: Self-pay | Admitting: Internal Medicine

## 2021-02-11 DIAGNOSIS — J4540 Moderate persistent asthma, uncomplicated: Secondary | ICD-10-CM

## 2021-02-13 LAB — ACID FAST CULTURE WITH REFLEXED SENSITIVITIES (MYCOBACTERIA): Acid Fast Culture: NEGATIVE

## 2021-02-18 ENCOUNTER — Inpatient Hospital Stay: Payer: 59 | Admitting: Internal Medicine

## 2021-02-21 ENCOUNTER — Encounter: Payer: Self-pay | Admitting: Internal Medicine

## 2021-02-21 NOTE — Progress Notes (Signed)
See if the results of patient's final AFB culture from the health department on 02/08/2021.  All 3 cultures were negative.

## 2021-02-25 ENCOUNTER — Encounter: Payer: Self-pay | Admitting: Pulmonary Disease

## 2021-02-25 ENCOUNTER — Other Ambulatory Visit: Payer: Self-pay

## 2021-02-25 ENCOUNTER — Ambulatory Visit (INDEPENDENT_AMBULATORY_CARE_PROVIDER_SITE_OTHER): Payer: 59 | Admitting: Pulmonary Disease

## 2021-02-25 ENCOUNTER — Ambulatory Visit (INDEPENDENT_AMBULATORY_CARE_PROVIDER_SITE_OTHER): Payer: 59

## 2021-02-25 VITALS — BP 122/78 | HR 94 | Temp 97.7°F | Ht 61.0 in | Wt 166.0 lb

## 2021-02-25 DIAGNOSIS — K089 Disorder of teeth and supporting structures, unspecified: Secondary | ICD-10-CM

## 2021-02-25 DIAGNOSIS — K029 Dental caries, unspecified: Secondary | ICD-10-CM

## 2021-02-25 DIAGNOSIS — J90 Pleural effusion, not elsewhere classified: Secondary | ICD-10-CM | POA: Diagnosis not present

## 2021-02-25 DIAGNOSIS — J189 Pneumonia, unspecified organism: Secondary | ICD-10-CM

## 2021-02-25 NOTE — Patient Instructions (Signed)
Thank you for visiting Dr. Valeta Harms at [facility]. Today we recommend the following:  Make sure to keep appt with Dentist on April 20th.  Return in about 6 months (around 08/27/2021), or if symptoms worsen or fail to improve, for with Carolynn Serve, NP and a repeat CXR .    Please do your part to reduce the spread of COVID-19.

## 2021-02-25 NOTE — Progress Notes (Signed)
Synopsis: Referred in December 2021 for abnormal CT chest by Ladell Pier, MD  Subjective:   PATIENT ID: Joseph Fitzgerald GENDER: male DOB: [birth year], MRN: [id number]  Chief Complaint  Patient presents with  . Follow-up    Follow up from pneumonia, denies any respiratory complaints currently.     50 year old gentleman, past medical history of asthma, diabetes.  Patient initially presented to urgent care with pleuritic chest pain in October 01, 2020.  For evaluation of cough.  Patient had CT scan of the chest with a left lower lobe mass versus pneumonia.  Patient was treated with amoxicillin and azithromycin.  Recommended follow-up.  Patient had CT imaging after being seen by Dr. Wynetta Emery his primary care provider.  CT chest revealed a 5.6 x 5.5 dependent masslike density with a early suggestion of cavitation.  Enlarged left hilar subcarinal nodes either reactive versus metastatic.  Imaging was felt to be most suggestive of infection but malignancy not excluded.  OV 11/09/2020: Patient here today for follow-up after abnormal imaging.  Patient states he completed his course of antibiotics.  He does feel that his breathing is better.  Patient denies fevers chills night sweats.  He works as a Training and development officer.  He lives with his family.  No pets or animals in the home.  No unusual exposures.  OV 02/25/2021: Patient here today for follow-up after recent hospitalization for necrotizing pneumonia complicated parapneumonic effusion and drainage.  Patient had admission with TPA and dornase.  Ultimately was discharged on antibiotics with follow-up to see Patricia Nettle, NP in the office.  Patient here today for a 1 month follow-up and repeat chest x-ray to evaluate his community-acquired pneumonia.  Last seen in the office on 01/28/2021.  Completed his course of antibiotics.  Overall the patient is feeling better.  Taken him some time to recover its been a slow process but overall he is back to work.  Feels less short of  breath.  Patient had chest x-ray completed prior to today's office visit this morning which revealed persistent patchy bandlike infiltrates within the left lower lobe which appear improved in comparison to previous.   Past Medical History:  Diagnosis Date  . Aortic atherosclerosis ([place])   . Asthma   . Marijuana abuse   . Mass of lower lobe of left lung   . Pneumonia   . Poor dentition   . Tobacco abuse      Family History  Problem Relation Age of Onset  . Asthma Brother   . Diabetes Brother      Past Surgical History:  Procedure Laterality Date  . NO PAST SURGERIES      Social History   Socioeconomic History  . Marital status: Single    Spouse name: Not on file  . Number of children: 3  . Years of education: 57  . Highest education level: Not on file  Occupational History  . Occupation: Cook   Tobacco Use  . Smoking status: Never Smoker  . Smokeless tobacco: Former Systems developer    Types: Secondary school teacher  . Vaping Use: Never used  Substance and Sexual Activity  . Alcohol use: Not Currently    Comment: beer  . Drug use: Not Currently    Types: Marijuana  . Sexual activity: Yes  Other Topics Concern  . Not on file  Social History Narrative  . Not on file   Social Determinants of Health   Financial Resource Strain: Not on file  Food Insecurity: Not on  file  Transportation Needs: Not on file  Physical Activity: Not on file  Stress: Not on file  Social Connections: Not on file  Intimate Partner Violence: Not on file     Allergies  Allergen Reactions  . Penicillins Rash    Did it involve swelling of the face/tongue/throat, SOB, or low BP?No Did it involve sudden or severe rash/hives, skin peeling, or any reaction on the inside of your mouth or nose?Yes Did you need to seek medical attention at a hospital or doctor's office? no When did it last happen?Child If all above answers are "NO", may proceed with cephalosporin use.     Outpatient Medications Prior to  Visit  Medication Sig Dispense Refill  . albuterol (VENTOLIN HFA) 108 (90 Base) MCG/ACT inhaler Inhale 2 puffs into the lungs every 4 (four) hours as needed for wheezing or shortness of breath. 18 g 0  . BREO ELLIPTA 200-25 MCG/INH AEPB INHALE 1 PUFF INTO THE LUNGS DAILY 60 each 4  . olopatadine (PATANOL) 0.1 % ophthalmic solution Place 1 drop into both eyes 2 (two) times daily. 5 mL 0  . HYDROcodone-acetaminophen (NORCO/VICODIN) 5-325 MG tablet Take 1-2 tablets by mouth every 6 (six) hours as needed for severe pain. (Patient not taking: Reported on 02/25/2021) 10 tablet 0  . ipratropium-albuterol (DUONEB) 0.5-2.5 (3) MG/3ML SOLN Take 3 mLs by nebulization every 6 (six) hours. (Patient not taking: No sig reported) 360 mL 1   No facility-administered medications prior to visit.    Review of Systems  Constitutional: Negative for chills, fever, malaise/fatigue and weight loss.  HENT: Negative for hearing loss, sore throat and tinnitus.   Eyes: Negative for blurred vision and double vision.  Respiratory: Positive for cough and shortness of breath. Negative for hemoptysis, sputum production, wheezing and stridor.   Cardiovascular: Negative for chest pain, palpitations, orthopnea, leg swelling and PND.  Gastrointestinal: Negative for abdominal pain, constipation, diarrhea, heartburn, nausea and vomiting.  Genitourinary: Negative for dysuria, hematuria and urgency.  Musculoskeletal: Negative for joint pain and myalgias.  Skin: Negative for itching and rash.  Neurological: Negative for dizziness, tingling, weakness and headaches.  Endo/Heme/Allergies: Negative for environmental allergies. Does not bruise/bleed easily.  Psychiatric/Behavioral: Negative for depression. The patient is not nervous/anxious and does not have insomnia.   All other systems reviewed and are negative.    Objective:  Physical Exam Vitals reviewed.  Constitutional:      General: He is not in acute distress.     Appearance: He is well-developed.  HENT:     Head: Normocephalic and atraumatic.  Eyes:     General: No scleral icterus.    Conjunctiva/sclera: Conjunctivae normal.     Pupils: Pupils are equal, round, and reactive to light.  Neck:     Vascular: No JVD.     Trachea: No tracheal deviation.  Cardiovascular:     Rate and Rhythm: Normal rate and regular rhythm.     Heart sounds: Normal heart sounds. No murmur heard.   Pulmonary:     Effort: Pulmonary effort is normal. No tachypnea, accessory muscle usage or respiratory distress.     Breath sounds: No stridor. No wheezing, rhonchi or rales.     Comments: Diminished left base  Abdominal:     General: Bowel sounds are normal. There is no distension.     Palpations: Abdomen is soft.     Tenderness: There is no abdominal tenderness.  Musculoskeletal:        General: No tenderness.  Cervical back: Neck supple.  Lymphadenopathy:     Cervical: No cervical adenopathy.  Skin:    General: Skin is warm and dry.     Capillary Refill: Capillary refill takes less than 2 seconds.     Findings: No rash.  Neurological:     Mental Status: He is alert and oriented to person, place, and time.  Psychiatric:        Behavior: Behavior normal.      Vitals:   02/25/21 1517  BP: 122/78  Pulse: 94  Temp: 97.7 F (36.5 C)  TempSrc: Temporal  SpO2: 98%  Weight: 166 lb (75.3 kg)  Height: 5' 1"  (1.549 m)   98% on RA BMI Readings from Last 3 Encounters:  02/25/21 31.37 kg/m  01/28/21 30.46 kg/m  01/26/21 31.52 kg/m   Wt Readings from Last 3 Encounters:  02/25/21 166 lb (75.3 kg)  01/28/21 161 lb 3.2 oz (73.1 kg)  01/26/21 161 lb 6.4 oz (73.2 kg)     CBC    Component Value Date/Time   WBC 5.2 01/26/2021 1155   WBC 8.2 01/07/2021 0756   RBC 5.04 01/26/2021 1155   RBC 4.41 01/07/2021 0756   HGB 14.9 01/26/2021 1155   HCT 44.4 01/26/2021 1155   PLT 246 01/26/2021 1155   MCV 88 01/26/2021 1155   MCH 29.6 01/26/2021 1155   MCH  30.4 01/07/2021 0756   MCHC 33.6 01/26/2021 1155   MCHC 34.3 01/07/2021 0756   RDW 15.2 01/26/2021 1155   LYMPHSABS 1.0 01/05/2021 0052   LYMPHSABS 2.4 10/19/2020 1012   MONOABS 1.3 (H) 01/05/2021 0052   EOSABS 0.1 01/05/2021 0052   EOSABS 0.5 (H) 10/19/2020 1012   BASOSABS 0.0 01/05/2021 0052   BASOSABS 0.1 10/19/2020 1012     Chest Imaging: CT chest 10/14/2020: Left lower lobe masslike density 5.6 x 5.5 cm overall appearance is suggestive of infection however malignancy not excluded.  Also has associated left hilar and subcarinal nodes that are enlarged.  I presume her reactive however metastasis is not excluded. The patient's images have been independently reviewed by me.    Chest x-ray 11/09/2020: Office chest x-ray completed today which revealed persistence of left lower lobe masslike density, however much smaller in size.The patient's images have been independently reviewed by me.    Chest x-ray 02/25/2021: Improvement of left basilar infiltrate.  There is residual scarring and atelectasis present. The patient's images have been independently reviewed by me.     Pulmonary Functions Testing Results: No flowsheet data found.  FeNO:   Pathology:   Echocardiogram:   Heart Catheterization:     Assessment & Plan:     ICD-10-CM   1. Severe pneumonia  J18.9 DG Chest 2 View  2. Poor dentition  K08.9   3. Loculated pleural effusion  J90   4. Dental caries  K02.9   5. Community acquired pneumonia of left lower lobe of lung  J18.9     Discussion:  This is a 50 year old gentleman abnormal chest x-ray initially found to have a left lower lobe mass and concern for a necrotizing pneumonia.  Patient was treated conservatively with antibiotics outpatient.  Continued to worsen found to have a loculated pleural effusion was admitted to the hospital status post pigtail catheter drainage with TPA and dornase.  Patient was discharged home after being managed inpatient by the pulmonary  service.  Plan: Subsequent follow-ups for chest imaging have showed improvement. Repeat chest x-ray was completed today and reviewed in the  office. Has completed course of antibiotics. He has an appointment scheduled with the dentist in approximately 3 weeks. Encouraged him to make sure he kept that appointment.  Patient to follow-up with [place] in 6 months for repeat chest x-ray Can see me or Rexene Edison, NP.    Current Outpatient Medications:  .  albuterol (VENTOLIN HFA) 108 (90 Base) MCG/ACT inhaler, Inhale 2 puffs into the lungs every 4 (four) hours as needed for wheezing or shortness of breath., Disp: 18 g, Rfl: 0 .  BREO ELLIPTA 200-25 MCG/INH AEPB, INHALE 1 PUFF INTO THE LUNGS DAILY, Disp: 60 each, Rfl: 4 .  olopatadine (PATANOL) 0.1 % ophthalmic solution, Place 1 drop into both eyes 2 (two) times daily., Disp: 5 mL, Rfl: 0 .  HYDROcodone-acetaminophen (NORCO/VICODIN) 5-325 MG tablet, Take 1-2 tablets by mouth every 6 (six) hours as needed for severe pain. (Patient not taking: Reported on 02/25/2021), Disp: 10 tablet, Rfl: 0 .  ipratropium-albuterol (DUONEB) 0.5-2.5 (3) MG/3ML SOLN, Take 3 mLs by nebulization every 6 (six) hours. (Patient not taking: No sig reported), Disp: 360 mL, Rfl: 1   [name], DO [facility] 02/25/2021 3:21 PM

## 2021-03-15 ENCOUNTER — Ambulatory Visit: Payer: 59 | Admitting: Internal Medicine

## 2021-06-03 ENCOUNTER — Other Ambulatory Visit: Payer: Self-pay

## 2021-06-03 ENCOUNTER — Ambulatory Visit: Payer: MEDICAID | Attending: Internal Medicine | Admitting: Internal Medicine

## 2021-06-03 ENCOUNTER — Encounter: Payer: Self-pay | Admitting: Internal Medicine

## 2021-06-03 VITALS — BP 120/82 | HR 79 | Resp 16 | Wt 174.8 lb

## 2021-06-03 DIAGNOSIS — J4540 Moderate persistent asthma, uncomplicated: Secondary | ICD-10-CM

## 2021-06-03 DIAGNOSIS — E669 Obesity, unspecified: Secondary | ICD-10-CM

## 2021-06-03 DIAGNOSIS — Z1211 Encounter for screening for malignant neoplasm of colon: Secondary | ICD-10-CM

## 2021-06-03 NOTE — Progress Notes (Signed)
Patient ID: Joseph Fitzgerald, male    DOB: 03/26/1971  MRN: [id number]  CC: Asthma   Subjective: Joseph Fitzgerald is a 50 y.o. male who presents for chronic ds management His concerns today include:  Pt with mod persistent asthma, obesity, environmental allergies and eczema, loculated pleural effusion left side requiring fibrinolytic therapy through chest tube 12/2020  Saw Dr. Madelon Lips since he saw me 01/2021.  I have reviewed the pulmonologist notes.  They did repeat chest x-ray on him.  This showed that the pneumonic mass/area in the left lung continue to improve on x-ray.His AFB cultures from the health department were negative. Doing well as far as his asthma.  No recent flares even with summer weather.  He avoids being out in heat.  No cough or fever.  Using Breo inhaler once a day.  Gain 13 lbs sine last visit.  He is wearing heavy boots today.  He tells me he has been eating more.  Drinks water, sodas, sweet tea and coconut water. Meats include fish, chicken, steak.   Plans volley ball once a wk  HM:  Due for colon CA screening.  No family history of colon cancer or polyps. Patient Active Problem List   Diagnosis Date Noted   Chest tube in place    COVID-19 virus infection 12/31/2020   Loculated pleural effusion 12/31/2020   Asthma    Poor dentition    Aortic atherosclerosis ([place])    Mass of lower lobe of left lung    Marijuana abuse    Tobacco abuse    Dental caries 12/07/2020   Hypokalemia 03/17/2018   Hyperglycemia 03/17/2018   Asthma, moderate persistent 08/17/2016   Vitamin D deficiency 09/01/2015   Family history of diabetes mellitus in brother 08/31/2015   Eczema 08/31/2015   Environmental allergies 04/16/2014     Current Outpatient Medications on File Prior to Visit  Medication Sig Dispense Refill   albuterol (VENTOLIN HFA) 108 (90 Base) MCG/ACT inhaler Inhale 2 puffs into the lungs every 4 (four) hours as needed for wheezing or shortness of breath. 18 g 0    BREO ELLIPTA 200-25 MCG/INH AEPB INHALE 1 PUFF INTO THE LUNGS DAILY 60 each 4   ipratropium-albuterol (DUONEB) 0.5-2.5 (3) MG/3ML SOLN Take 3 mLs by nebulization every 6 (six) hours. (Patient not taking: No sig reported) 360 mL 1   olopatadine (PATANOL) 0.1 % ophthalmic solution Place 1 drop into both eyes 2 (two) times daily. 5 mL 0   No current facility-administered medications on file prior to visit.    Allergies  Allergen Reactions   Penicillins Rash    Did it involve swelling of the face/tongue/throat, SOB, or low BP?No Did it involve sudden or severe rash/hives, skin peeling, or any reaction on the inside of your mouth or nose?Yes Did you need to seek medical attention at a hospital or doctor's office? no When did it last happen? Child If all above answers are "NO", may proceed with cephalosporin use.    Social History   Socioeconomic History   Marital status: Single    Spouse name: Not on file   Number of children: 3   Years of education: 12   Highest education level: Not on file  Occupational History   Occupation: Cook   Tobacco Use   Smoking status: Never   Smokeless tobacco: Former    Types: Nurse, children's Use: Never used  Substance and Sexual Activity   Alcohol use: Not Currently  Comment: beer   Drug use: Not Currently    Types: Marijuana   Sexual activity: Yes  Other Topics Concern   Not on file  Social History Narrative   Not on file   Social Determinants of Health   Financial Resource Strain: Not on file  Food Insecurity: Not on file  Transportation Needs: Not on file  Physical Activity: Not on file  Stress: Not on file  Social Connections: Not on file  Intimate Partner Violence: Not on file    Family History  Problem Relation Age of Onset   Asthma Brother    Diabetes Brother     Past Surgical History:  Procedure Laterality Date   NO PAST SURGERIES      ROS: Review of Systems Negative except as stated above  PHYSICAL  EXAM: BP 120/82   Pulse 79   Resp 16   Wt 174 lb 12.8 oz (79.3 kg)   SpO2 93%   BMI 33.03 kg/m   Wt Readings from Last 3 Encounters:  06/03/21 174 lb 12.8 oz (79.3 kg)  02/25/21 166 lb (75.3 kg)  01/28/21 161 lb 3.2 oz (73.1 kg)    Physical Exam General appearance - alert, well appearing, middle-age male and in no distress Mental status - normal mood, behavior, speech, dress, motor activity, and thought processes Chest - clear to auscultation, no wheezes, rales or rhonchi, symmetric air entry Heart - normal rate, regular rhythm, normal S1, S2, no murmurs, rubs, clicks or gallops Extremities - peripheral pulses normal, no pedal edema, no clubbing or cyanosis  CMP Latest Ref Rng & Units 01/26/2021 01/07/2021 01/05/2021  Glucose 65 - 99 mg/dL 77 118(H) 124(H)  BUN 6 - 24 mg/dL 10 7 10   Creatinine 0.76 - 1.27 mg/dL 0.80 0.67 0.72  Sodium 134 - 144 mmol/L 141 138 135  Potassium 3.5 - 5.2 mmol/L 4.6 3.8 3.4(L)  Chloride 96 - 106 mmol/L 102 103 100  CO2 20 - 29 mmol/L 22 25 26   Calcium 8.7 - 10.2 mg/dL 9.7 8.1(L) 7.8(L)  Total Protein 6.0 - 8.5 g/dL 8.3 - 6.3(L)  Total Bilirubin 0.0 - 1.2 mg/dL 0.4 - 0.5  Alkaline Phos 44 - 121 IU/L 128(H) - 56  AST 0 - 40 IU/L 23 - 22  ALT 0 - 44 IU/L 26 - 16   Lipid Panel     Component Value Date/Time   CHOL 149 12/31/2020 0906   CHOL 196 01/04/2018 1540   TRIG 32 12/31/2020 0906   HDL 39 (L) 12/31/2020 0906   HDL 44 01/04/2018 1540   CHOLHDL 3.8 12/31/2020 0906   VLDL 6 12/31/2020 0906   LDLCALC 104 (H) 12/31/2020 0906   LDLCALC 114 (H) 01/04/2018 1540    CBC    Component Value Date/Time   WBC 5.2 01/26/2021 1155   WBC 8.2 01/07/2021 0756   RBC 5.04 01/26/2021 1155   RBC 4.41 01/07/2021 0756   HGB 14.9 01/26/2021 1155   HCT 44.4 01/26/2021 1155   PLT 246 01/26/2021 1155   MCV 88 01/26/2021 1155   MCH 29.6 01/26/2021 1155   MCH 30.4 01/07/2021 0756   MCHC 33.6 01/26/2021 1155   MCHC 34.3 01/07/2021 0756   RDW 15.2 01/26/2021  1155   LYMPHSABS 1.0 01/05/2021 0052   LYMPHSABS 2.4 10/19/2020 1012   MONOABS 1.3 (H) 01/05/2021 0052   EOSABS 0.1 01/05/2021 0052   EOSABS 0.5 (H) 10/19/2020 1012   BASOSABS 0.0 01/05/2021 0052   BASOSABS 0.1 10/19/2020 1012  ASSESSMENT AND PLAN:  1. Obesity (BMI 30.0-34.9) Dietary counseling given as shown below.  Printed information also given.  Encouraged him to try to get in some form of exercise at least 3 to 5 days a week for 30 minutes. Follow a Healthy Eating Plan - You can do it! Limit sugary drinks.  Avoid sodas, sweet tea, sport or energy drinks, or fruit drinks.  Drink water, lo-fat milk, or diet drinks. Limit snack foods.   Cut back on candy, cake, cookies, chips, ice cream.  These are a special treat, only in small amounts. Eat plenty of vegetables.  Especially dark green, red, and orange vegetables. Aim for at least 3 servings a day. More is better! Include fruit in your daily diet.  Whole fruit is much healthier than fruit juice! Limit "white" bread, "white" pasta, "white" rice.   Choose "100% whole grain" products, brown or wild rice. Avoid fatty meats. Try "Meatless Monday" and choose eggs or beans one day a week.  When eating meat, choose lean meats like chicken, [place], and fish.  Grill, broil, or bake meats instead of frying, and eat poultry without the skin. Eat less salt.  Avoid frozen pizzas, frozen dinners and salty foods.  Use seasonings other than salt in cooking.  This can help blood pressure and keep you from swelling Beer, wine and liquor have calories.  If you can safely drink alcohol, limit to 1 drink per day for women, 2 drinks for men  - Hemoglobin A1c  2. Moderate persistent asthma without complication Controlled on current inhaler.  3. Screening for colon cancer Discussed colon cancer screening methods.  Patient prefers to do the Cologuard test - Cologuard  Patient was given the opportunity to ask questions.  Patient verbalized understanding of  the plan and was able to repeat key elements of the plan.   Orders Placed This Encounter  Procedures   Hemoglobin A1c   Cologuard     Requested Prescriptions    No prescriptions requested or ordered in this encounter    Return in about 4 months (around 10/04/2021).  Karle Plumber, MD, FACP

## 2021-06-03 NOTE — Patient Instructions (Signed)

## 2021-06-04 ENCOUNTER — Telehealth: Payer: Self-pay

## 2021-06-04 LAB — HEMOGLOBIN A1C
Est. average glucose Bld gHb Est-mCnc: 114 mg/dL
Hgb A1c MFr Bld: 5.6 % (ref 4.8–5.6)

## 2021-06-04 NOTE — Telephone Encounter (Signed)
Contacted pt to go over lab results pt is aware and doesn't have any questions or concerns 

## 2021-07-06 LAB — COLOGUARD: Cologuard: NEGATIVE

## 2021-07-26 ENCOUNTER — Encounter: Payer: Self-pay | Admitting: *Deleted

## 2021-09-02 ENCOUNTER — Encounter: Payer: Self-pay | Admitting: Adult Health

## 2021-09-02 ENCOUNTER — Ambulatory Visit (INDEPENDENT_AMBULATORY_CARE_PROVIDER_SITE_OTHER): Payer: 59

## 2021-09-02 ENCOUNTER — Other Ambulatory Visit: Payer: Self-pay

## 2021-09-02 ENCOUNTER — Ambulatory Visit (INDEPENDENT_AMBULATORY_CARE_PROVIDER_SITE_OTHER): Payer: 59 | Admitting: Primary Care

## 2021-09-02 VITALS — BP 116/74 | HR 60 | Temp 98.1°F | Ht 63.0 in | Wt 167.8 lb

## 2021-09-02 DIAGNOSIS — J850 Gangrene and necrosis of lung: Secondary | ICD-10-CM | POA: Diagnosis not present

## 2021-09-02 DIAGNOSIS — J189 Pneumonia, unspecified organism: Secondary | ICD-10-CM | POA: Diagnosis not present

## 2021-09-02 DIAGNOSIS — J4540 Moderate persistent asthma, uncomplicated: Secondary | ICD-10-CM

## 2021-09-02 MED ORDER — MONTELUKAST SODIUM 10 MG PO TABS: 10.0000 mg | ORAL_TABLET | Freq: Every day | ORAL | 11 refills | Status: DC

## 2021-09-02 NOTE — Progress Notes (Signed)
@Patient  ID: [name], male    DOB: 22-Oct-1971, 50 y.o.   MRN: [id number]  No chief complaint on file.   Referring provider: Ladell Pier, MD  HPI: 50 year old gentleman, past medical history of asthma, diabetes.  Patient initially presented to urgent care with pleuritic chest pain in October 01, 2020.  For evaluation of cough.  Patient had CT scan of the chest with a left lower lobe mass versus pneumonia.  Patient was treated with amoxicillin and azithromycin.  Recommended follow-up.  Patient had CT imaging after being seen by Dr. Wynetta Emery his primary care provider.  CT chest revealed a 5.6 x 5.5 dependent masslike density with a early suggestion of cavitation.  Enlarged left hilar subcarinal nodes either reactive versus metastatic.  Imaging was felt to be most suggestive of infection but malignancy not excluded.    Previous [facility] pulmonary encounter: OV 11/09/2020: Patient here today for follow-up after abnormal imaging.  Patient states he completed his course of antibiotics.  He does feel that his breathing is better.  Patient denies fevers chills night sweats.  He works as a Training and development officer.  He lives with his family.  No pets or animals in the home.  No unusual exposures.   OV 02/25/2021: Patient here today for follow-up after recent hospitalization for necrotizing pneumonia complicated parapneumonic effusion and drainage.  Patient had admission with TPA and dornase.  Ultimately was discharged on antibiotics with follow-up to see Patricia Nettle, NP in the office.  Patient here today for a 1 month follow-up and repeat chest x-ray to evaluate his community-acquired pneumonia.  Last seen in the office on 01/28/2021.  Completed his course of antibiotics.  Overall the patient is feeling better.  Taken him some time to recover its been a slow process but overall he is back to work.  Feels less short of breath.  Patient had chest x-ray completed prior to today's office visit this morning which revealed  persistent patchy bandlike infiltrates within the left lower lobe which appear improved in comparison to previous.    09/02/2021- interim hx  Patient presents today for 6 month follow-up/pneumonia. He is doing well. No acute complaints. He smokes marijuana to help him sleep. He uses BREO ellipta daily. He does not require his albuterol rescue inhaler. Denies f/c/s, chest tightness, shortness of breath, cough or wheezing.    Allergies  Allergen Reactions   Penicillins Rash    Did it involve swelling of the face/tongue/throat, SOB, or low BP?No Did it involve sudden or severe rash/hives, skin peeling, or any reaction on the inside of your mouth or nose?Yes Did you need to seek medical attention at a hospital or doctor's office? no When did it last happen? Child If all above answers are "NO", may proceed with cephalosporin use.    Immunization History  Administered Date(s) Administered   Influenza Split 09/03/2013   Influenza,inj,Quad PF,6+ Mos 01/22/2015, 08/31/2015, 08/17/2016, 08/25/2017, 09/13/2018, 09/10/2019, 11/13/2020   PFIZER(Purple Top)SARS-COV-2 Vaccination 12/12/2020, 01/12/2021   Pneumococcal Polysaccharide-23 06/13/2013, 09/10/2019   Tdap 10/28/2016    Past Medical History:  Diagnosis Date   Aortic atherosclerosis ([place])    Asthma    Marijuana abuse    Mass of lower lobe of left lung    Pneumonia    Poor dentition    Tobacco abuse     Tobacco History: Social History   Tobacco Use  Smoking Status Never  Smokeless Tobacco Former   Types: Chew   Counseling given: Not Answered   Outpatient  Medications Prior to Visit  Medication Sig Dispense Refill   albuterol (VENTOLIN HFA) 108 (90 Base) MCG/ACT inhaler Inhale 2 puffs into the lungs every 4 (four) hours as needed for wheezing or shortness of breath. 18 g 0   BREO ELLIPTA 200-25 MCG/INH AEPB INHALE 1 PUFF INTO THE LUNGS DAILY 60 each 4   ipratropium-albuterol (DUONEB) 0.5-2.5 (3) MG/3ML SOLN Take 3 mLs by  nebulization every 6 (six) hours. 360 mL 1   olopatadine (PATANOL) 0.1 % ophthalmic solution Place 1 drop into both eyes 2 (two) times daily. 5 mL 0   No facility-administered medications prior to visit.    Review of Systems  Review of Systems  Constitutional: Negative.   HENT:  Positive for postnasal drip.   Respiratory:  Positive for cough. Negative for chest tightness, shortness of breath and wheezing.   Cardiovascular: Negative.     Physical Exam  BP 116/74 (BP Location: Left Arm, Patient Position: Sitting, Cuff Size: Normal)   Pulse 60   Temp 98.1 F (36.7 C) (Oral)   Ht 5' 3"  (1.6 m)   Wt 167 lb 12.8 oz (76.1 kg)   SpO2 99%   BMI 29.72 kg/m  Physical Exam Constitutional:      Appearance: Normal appearance.  HENT:     Head: Normocephalic and atraumatic.     Mouth/Throat:     Comments: Deferred d/t masking Eyes:     Conjunctiva/sclera:     Right eye: Right conjunctiva is injected.     Left eye: Left conjunctiva is injected.  Pulmonary:     Effort: Pulmonary effort is normal.     Breath sounds: Normal breath sounds.     Comments: Insp wheeze Musculoskeletal:        General: Normal range of motion.  Skin:    General: Skin is warm and dry.  Neurological:     General: No focal deficit present.     Mental Status: He is alert and oriented to person, place, and time. Mental status is at baseline.  Psychiatric:        Mood and Affect: Mood normal.        Behavior: Behavior normal.        Thought Content: Thought content normal.        Judgment: Judgment normal.     Lab Results:  CBC    Component Value Date/Time   WBC 5.2 01/26/2021 1155   WBC 8.2 01/07/2021 0756   RBC 5.04 01/26/2021 1155   RBC 4.41 01/07/2021 0756   HGB 14.9 01/26/2021 1155   HCT 44.4 01/26/2021 1155   PLT 246 01/26/2021 1155   MCV 88 01/26/2021 1155   MCH 29.6 01/26/2021 1155   MCH 30.4 01/07/2021 0756   MCHC 33.6 01/26/2021 1155   MCHC 34.3 01/07/2021 0756   RDW 15.2 01/26/2021  1155   LYMPHSABS 1.0 01/05/2021 0052   LYMPHSABS 2.4 10/19/2020 1012   MONOABS 1.3 (H) 01/05/2021 0052   EOSABS 0.1 01/05/2021 0052   EOSABS 0.5 (H) 10/19/2020 1012   BASOSABS 0.0 01/05/2021 0052   BASOSABS 0.1 10/19/2020 1012    BMET    Component Value Date/Time   NA 141 01/26/2021 1155   K 4.6 01/26/2021 1155   CL 102 01/26/2021 1155   CO2 22 01/26/2021 1155   GLUCOSE 77 01/26/2021 1155   GLUCOSE 118 (H) 01/07/2021 0756   BUN 10 01/26/2021 1155   CREATININE 0.80 01/26/2021 1155   CREATININE 0.97 01/22/2015 1009   CALCIUM 9.7  01/26/2021 1155   GFRNONAA >60 01/07/2021 0756   GFRNONAA >89 01/22/2015 1009   GFRAA 96 10/19/2020 1012   GFRAA >89 01/22/2015 1009    BNP No results found for: BNP  ProBNP No results found for: PROBNP  Imaging: No results found.   Assessment & Plan:   Asthma, moderate persistent - Appears stable; No acute respiratory complaints. Occasional cough. He had inspiratory wheezing on exam  - Continue Breo 263mg one puff daily; adding Singulair 180mat bedtime - Advised he stop smoking marijuana  - FU in 6 months with Dr. IcValeta Harms [facility] 20[facility]or complicated parapneumonia effusion and necrotizing pneumonia  - CXR on 02/25/21 showed patchy bandlike infiltrated within left lower lobe which appeared improved to previous - Needs follow-up CXR today    ElMartyn EhrichNP 09/02/2021

## 2021-09-02 NOTE — Assessment & Plan Note (Signed)
-   Hospitalized in March 7616 for complicated parapneumonia effusion and necrotizing pneumonia  - CXR on 02/25/21 showed patchy bandlike infiltrated within left lower lobe which appeared improved to previous - Needs follow-up CXR today

## 2021-09-02 NOTE — Assessment & Plan Note (Signed)
-   Appears stable; No acute respiratory complaints. Occasional cough. He had inspiratory wheezing on exam  - Continue Breo 218mg one puff daily; adding Singulair 180mat bedtime - Advised he stop smoking marijuana  - FU in 6 months with Dr. IcValeta Harms

## 2021-09-02 NOTE — Patient Instructions (Addendum)
Recommendations: Continue Breo- one puff daily in the morning Use albuterol 2 puffs every 6 hours as needed for shortness of breath or wheezing  Start Singulair 91m at bedtime Strongly encourage you STOP smoking marijuana  Orders: CXR today  Follow-up: 6 months with Dr. IValeta Harms

## 2021-09-03 NOTE — Progress Notes (Signed)
Please let patient know CXR showed resolution of previous pneumonia, no acute process

## 2021-09-06 ENCOUNTER — Other Ambulatory Visit: Payer: Self-pay | Admitting: Internal Medicine

## 2021-09-06 DIAGNOSIS — J4540 Moderate persistent asthma, uncomplicated: Secondary | ICD-10-CM

## 2021-09-06 NOTE — Telephone Encounter (Signed)
Requested Prescriptions  Pending Prescriptions Disp Refills  . BREO ELLIPTA 200-25 MCG/INH AEPB [Pharmacy Med Name: BREO ELLIPTA 200-25MCG ORAL INH(30)] 60 each 4    Sig: INHALE 1 PUFF INTO THE LUNGS DAILY     Pulmonology:  Comb[facility] Products Passed - 09/06/2021 10:39 AM      Passed - Valid encounter within last 12 months    Recent Outpatient Visits          3 months ago Obesity (BMI 30.0-34.9)   [place] Ladell Pier, MD   7 months ago Hospital discharge follow-up   [place], MD   9 months ago Lung mass   [place], MD   1 year ago Moderate persistent asthma without complication   [place], [place], NP   1 year ago Moderate persistent asthma without complication   [place], MD      Future Appointments            In 4 weeks Ladell Pier, MD [place]

## 2021-10-04 ENCOUNTER — Other Ambulatory Visit: Payer: Self-pay

## 2021-10-04 ENCOUNTER — Ambulatory Visit: Payer: 59 | Attending: Internal Medicine | Admitting: Internal Medicine

## 2021-10-04 ENCOUNTER — Encounter: Payer: Self-pay | Admitting: Internal Medicine

## 2021-10-04 VITALS — BP 130/80 | HR 56 | Resp 16 | Wt 160.8 lb

## 2021-10-04 DIAGNOSIS — R030 Elevated blood-pressure reading, without diagnosis of hypertension: Secondary | ICD-10-CM

## 2021-10-04 DIAGNOSIS — Z23 Encounter for immunization: Secondary | ICD-10-CM | POA: Diagnosis not present

## 2021-10-04 DIAGNOSIS — E663 Overweight: Secondary | ICD-10-CM | POA: Diagnosis not present

## 2021-10-04 DIAGNOSIS — J4540 Moderate persistent asthma, uncomplicated: Secondary | ICD-10-CM

## 2021-10-04 DIAGNOSIS — Z6828 Body mass index (BMI) 28.0-28.9, adult: Secondary | ICD-10-CM | POA: Diagnosis not present

## 2021-10-04 NOTE — Progress Notes (Signed)
Patient ID: Joseph Fitzgerald, male    DOB: 1971-04-10  MRN: [id number]  CC: Asthma   Subjective: Joseph Fitzgerald is a 50 y.o. male who presents for chronic ds management His concerns today include:  Pt with mod persistent asthma, obesity, environmental allergies and eczema, loculated pleural effusion left side requiring fibrinolytic therapy through chest tube 12/2020  Asthma:  saw pulmonary NP 1 mth ago.  Repeat CXR showed resolution of LT lower infiltrates. No recent flare Using Breo daily.  Uses Albuterol PRN.  Has not use Albuterol in over 1 mth.  He has not had to use his neb machine in over 1 mth Wgh down 6 lbs in the past 1 mth.  Reports he is eating less.  He is cut back significantly on sweet drinks.  States that he has 1 cup of sweet tea a day otherwise he drinks mainly water.  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  Admits that he uses a lot of salt in his foods.  HM:  due for flu and Pneumonia vaccines today  Patient Active Problem List   Diagnosis Date Noted   Necrotizing pneumonia ([place]) 09/02/2021   Chest tube in place    COVID-19 virus infection 12/31/2020   Loculated pleural effusion 12/31/2020   Asthma    Poor dentition    Aortic atherosclerosis ([place])    Mass of lower lobe of left lung    Marijuana abuse    Tobacco abuse    Dental caries 12/07/2020   Hypokalemia 03/17/2018   Hyperglycemia 03/17/2018   Asthma, moderate persistent 08/17/2016   Vitamin D deficiency 09/01/2015   Family history of diabetes mellitus in brother 08/31/2015   Eczema 08/31/2015   Environmental allergies 04/16/2014     Current Outpatient Medications on File Prior to Visit  Medication Sig Dispense Refill   albuterol (VENTOLIN HFA) 108 (90 Base) MCG/ACT inhaler Inhale 2 puffs into the lungs every 4 (four) hours as needed for wheezing or shortness of breath. 18 g 0   BREO ELLIPTA 200-25 MCG/INH AEPB INHALE 1 PUFF INTO THE LUNGS DAILY 60 each 4   ipratropium-albuterol (DUONEB)  0.5-2.5 (3) MG/3ML SOLN Take 3 mLs by nebulization every 6 (six) hours. 360 mL 1   montelukast (SINGULAIR) 10 MG tablet Take 1 tablet (10 mg total) by mouth at bedtime. 30 tablet 11   olopatadine (PATANOL) 0.1 % ophthalmic solution Place 1 drop into both eyes 2 (two) times daily. 5 mL 0   No current facility-administered medications on file prior to visit.    Allergies  Allergen Reactions   Penicillins Rash    Did it involve swelling of the face/tongue/throat, SOB, or low BP?No Did it involve sudden or severe rash/hives, skin peeling, or any reaction on the inside of your mouth or nose?Yes Did you need to seek medical attention at a hospital or doctor's office? no When did it last happen? Child If all above answers are "NO", may proceed with cephalosporin use.    Social History   Socioeconomic History   Marital status: Single    Spouse name: Not on file   Number of children: 3   Years of education: 12   Highest education level: Not on file  Occupational History   Occupation: Cook   Tobacco Use   Smoking status: Never   Smokeless tobacco: Former    Types: Nurse, children's Use: Never used  Substance and Sexual Activity   Alcohol  use: Not Currently    Comment: beer   Drug use: Not Currently    Types: Marijuana   Sexual activity: Yes  Other Topics Concern   Not on file  Social History Narrative   Not on file   Social Determinants of Health   Financial Resource Strain: Not on file  Food Insecurity: Not on file  Transportation Needs: Not on file  Physical Activity: Not on file  Stress: Not on file  Social Connections: Not on file  Intimate Partner Violence: Not on file    Family History  Problem Relation Age of Onset   Asthma Brother    Diabetes Brother     Past Surgical History:  Procedure Laterality Date   NO PAST SURGERIES      ROS: Review of Systems Negative except as stated above  PHYSICAL EXAM: BP 130/80   Pulse (!) 56   Resp 16   Wt  160 lb 12.8 oz (72.9 kg)   SpO2 95%   BMI 28.48 kg/m   Wt Readings from Last 3 Encounters:  10/04/21 160 lb 12.8 oz (72.9 kg)  09/02/21 167 lb 12.8 oz (76.1 kg)  06/03/21 174 lb 12.8 oz (79.3 kg)    Physical Exam   General appearance - alert, well appearing, middle-age male and in no distress Mental status - normal mood, behavior, speech, dress, motor activity, and thought processes Neck - supple, no significant adenopathy Chest -patient with a few scattered wheezes.  Otherwise lungs are clear with good air entry. Heart - normal rate, regular rhythm, normal S1, S2, no murmurs, rubs, clicks or gallops Extremities - peripheral pulses normal, no pedal edema, no clubbing or cyanosis Skin: He has eczematous changes noted on the hands. CMP Latest Ref Rng & Units 01/26/2021 01/07/2021 01/05/2021  Glucose 65 - 99 mg/dL 77 118(H) 124(H)  BUN 6 - 24 mg/dL 10 7 10   Creatinine 0.76 - 1.27 mg/dL 0.80 0.67 0.72  Sodium 134 - 144 mmol/L 141 138 135  Potassium 3.5 - 5.2 mmol/L 4.6 3.8 3.4(L)  Chloride 96 - 106 mmol/L 102 103 100  CO2 20 - 29 mmol/L 22 25 26   Calcium 8.7 - 10.2 mg/dL 9.7 8.1(L) 7.8(L)  Total Protein 6.0 - 8.5 g/dL 8.3 - 6.3(L)  Total Bilirubin 0.0 - 1.2 mg/dL 0.4 - 0.5  Alkaline Phos 44 - 121 IU/L 128(H) - 56  AST 0 - 40 IU/L 23 - 22  ALT 0 - 44 IU/L 26 - 16   Lipid Panel     Component Value Date/Time   CHOL 149 12/31/2020 0906   CHOL 196 01/04/2018 1540   TRIG 32 12/31/2020 0906   HDL 39 (L) 12/31/2020 0906   HDL 44 01/04/2018 1540   CHOLHDL 3.8 12/31/2020 0906   VLDL 6 12/31/2020 0906   LDLCALC 104 (H) 12/31/2020 0906   LDLCALC 114 (H) 01/04/2018 1540    CBC    Component Value Date/Time   WBC 5.2 01/26/2021 1155   WBC 8.2 01/07/2021 0756   RBC 5.04 01/26/2021 1155   RBC 4.41 01/07/2021 0756   HGB 14.9 01/26/2021 1155   HCT 44.4 01/26/2021 1155   PLT 246 01/26/2021 1155   MCV 88 01/26/2021 1155   MCH 29.6 01/26/2021 1155   MCH 30.4 01/07/2021 0756   MCHC 33.6  01/26/2021 1155   MCHC 34.3 01/07/2021 0756   RDW 15.2 01/26/2021 1155   LYMPHSABS 1.0 01/05/2021 0052   LYMPHSABS 2.4 10/19/2020 1012   MONOABS 1.3 (H)  01/05/2021 0052   EOSABS 0.1 01/05/2021 0052   EOSABS 0.5 (H) 10/19/2020 1012   BASOSABS 0.0 01/05/2021 0052   BASOSABS 0.1 10/19/2020 1012    ASSESSMENT AND PLAN: 1. Moderate persistent asthma without complication Controlled on Breo.  2. Overweight (BMI 25.0-29.9) Commended him on weight loss.  Commended him on cutting back on sugary drinks.  Encouraged him to incorporate fresh fruits and vegetables into the diet daily.  3. Elevated blood-pressure reading without diagnosis of hypertension DASH diet discussed and encouraged.  He will follow-up with the clinical pharmacist in 1 month for repeat blood pressure check.  4. Need for influenza vaccination Given today  5. Need for vaccination against Streptococcus pneumoniae - Pneumococcal conjugate vaccine 20-valent   Patient was given the opportunity to ask questions.  Patient verbalized understanding of the plan and was able to repeat key elements of the plan.   Orders Placed This Encounter  Procedures   Pneumococcal conjugate vaccine 20-valent   Flu Vaccine QUAD 59moIM (Fluarix, Fluzone & Alfiuria Quad PF)     Requested Prescriptions    No prescriptions requested or ordered in this encounter    Return in about 6 months (around 04/03/2022) [facility].  DKarle Plumber MD, FACP

## 2021-11-02 NOTE — Progress Notes (Signed)
   S:    PCP: Dr. Wynetta Emery  Patient presents to the clinic for hypertension evaluation, counseling, and management. Patient was referred and last seen by Primary Care Provider on 10/04/21, BP was 130/80 and referred to CPP for BP recheck. He does not have a diagnosis of HTN and does not take any HTN medications.   Denies dizziness, chest pain, SOB, swelling. Endorses rare headaches after work, naproxen helps.   Current BP Medications include:  none  Antihypertensives tried in the past include: none  Dietary habits include: three meals per day, always adds salt to food Exercise habits include: plays softball Family / Social history:  -Tobacco: never smoker -Fhx: asthma and DM in brother  O:  Vitals:   11/03/21 1344  BP: 126/86  Pulse: 69   Home BP readings: does not check   Last 3 Office BP readings: BP Readings from Last 3 Encounters:  11/03/21 126/86  10/04/21 130/80  09/02/21 116/74   BMET    Component Value Date/Time   NA 141 01/26/2021 1155   K 4.6 01/26/2021 1155   CL 102 01/26/2021 1155   CO2 22 01/26/2021 1155   GLUCOSE 77 01/26/2021 1155   GLUCOSE 118 (H) 01/07/2021 0756   BUN 10 01/26/2021 1155   CREATININE 0.80 01/26/2021 1155   CREATININE 0.97 01/22/2015 1009   CALCIUM 9.7 01/26/2021 1155   GFRNONAA >60 01/07/2021 0756   GFRNONAA >89 01/22/2015 1009   GFRAA 96 10/19/2020 1012   GFRAA >89 01/22/2015 1009    Renal function: CrCl cannot be calculated (Patient's most recent lab result is older than the maximum 21 days allowed.).  Clinical ASCVD: No  The 10-year ASCVD risk score (Arnett DK, et al., 2019) is: 2.3%   Values used to calculate the score:     Age: 50 years     Sex: Male     Is Non-Hispanic African American: No     Diabetic: No     Tobacco smoker: No     Systolic Blood Pressure: 527 mmHg     Is BP treated: No     HDL Cholesterol: 39 mg/dL     Total Cholesterol: 149 mg/dL   A/P: History of 1 elevated BP reading at last PCP visit but  without diagnosis of HTN. BP Goal = < 120/80 mmHg. BP falls into elevated range today.  -Counseled patient extensively on DASH diet  -Will recheck BP in 1 month -Counseled on lifestyle modifications for blood pressure control including reduced dietary sodium, increased exercise, adequate sleep.  Total time in face-to-face counseling 15 minutes. F/U Clinic Visit in 1 month.     Rebbeca Paul, PharmD PGY2 Ambulatory Care Pharmacy Resident 11/03/2021 1:52 PM

## 2021-11-03 ENCOUNTER — Other Ambulatory Visit: Payer: Self-pay

## 2021-11-03 ENCOUNTER — Ambulatory Visit: Payer: MEDICAID | Attending: Internal Medicine | Admitting: Pharmacist

## 2021-11-03 VITALS — BP 126/86 | HR 69

## 2021-11-03 DIAGNOSIS — R030 Elevated blood-pressure reading, without diagnosis of hypertension: Secondary | ICD-10-CM | POA: Diagnosis not present

## 2021-12-05 NOTE — Progress Notes (Signed)
° °  S:    PCP: Dr. Wynetta Emery  Patient presents to the clinic for hypertension evaluation, counseling, and management. Patient was referred and last seen by Primary Care Provider on 10/04/21, BP was 130/80 and referred to CPP for BP recheck. At visit on 12/7 with CPP, BP was in the elevated range and patient was counseled on DASH diet. He does not have a diagnosis of HTN and does not take any HTN medications.   Patient arrives in good spirits accompanied by his wife Estill Bamberg. He has not changed his diet (counseled to decrease salt intake at last visit) or increased physical activity since last visit. Denies dizziness, headache, swelling, blurred vision.   Current BP Medications include:  none  Antihypertensives tried in the past include: none  Dietary habits include: three meals per day, always adds salt to food Exercise habits include: plays softball Family / Social history:  -Tobacco: never smoker -Fhx: asthma and DM in brother  O:  Vitals:   12/06/21 1043  BP: (!) 144/86  Pulse: 63    Home BP readings: does not check   Last 3 Office BP readings: BP Readings from Last 3 Encounters:  12/06/21 (!) 144/86  11/03/21 126/86  10/04/21 130/80   BMET    Component Value Date/Time   NA 141 01/26/2021 1155   K 4.6 01/26/2021 1155   CL 102 01/26/2021 1155   CO2 22 01/26/2021 1155   GLUCOSE 77 01/26/2021 1155   GLUCOSE 118 (H) 01/07/2021 0756   BUN 10 01/26/2021 1155   CREATININE 0.80 01/26/2021 1155   CREATININE 0.97 01/22/2015 1009   CALCIUM 9.7 01/26/2021 1155   GFRNONAA >60 01/07/2021 0756   GFRNONAA >89 01/22/2015 1009   GFRAA 96 10/19/2020 1012   GFRAA >89 01/22/2015 1009    Renal function: CrCl cannot be calculated (Patient's most recent lab result is older than the maximum 21 days allowed.).  Clinical ASCVD: No  The 10-year ASCVD risk score (Arnett DK, et al., 2019) is: 2.9%   Values used to calculate the score:     Age: 51 years     Sex: Male     Is Non-Hispanic  African American: No     Diabetic: No     Tobacco smoker: No     Systolic Blood Pressure: 161 mmHg     Is BP treated: No     HDL Cholesterol: 39 mg/dL     Total Cholesterol: 149 mg/dL   A/P: History of elevated BP reading at last PCP and CPP visits but without diagnosis of HTN. BP today qualifies patient for classification of hypertension and need to start antihypertensive today. Recommend PCP to add essential hypertension to patient's problem list at future visits. BP Goal = < 130/80 mmHg.  -Start amlodipine 5 mg daily -Counseled patient extensively on DASH diet  -Counseled on lifestyle modifications for blood pressure control including reduced dietary sodium, increased exercise, adequate sleep.  Total time in face-to-face counseling 20 minutes. F/U Pharmacist Visit in 1 month. Next PCP visit in May.   Rebbeca Paul, PharmD PGY2 Ambulatory Care Pharmacy Resident 12/06/2021 10:43 AM

## 2021-12-06 ENCOUNTER — Encounter: Payer: Self-pay | Admitting: Pharmacist

## 2021-12-06 ENCOUNTER — Other Ambulatory Visit: Payer: Self-pay

## 2021-12-06 ENCOUNTER — Ambulatory Visit: Payer: MEDICAID | Attending: Internal Medicine | Admitting: Pharmacist

## 2021-12-06 VITALS — BP 144/86 | HR 63

## 2021-12-06 DIAGNOSIS — R030 Elevated blood-pressure reading, without diagnosis of hypertension: Secondary | ICD-10-CM

## 2021-12-06 MED ORDER — AMLODIPINE BESYLATE 5 MG PO TABS: 5.0000 mg | ORAL_TABLET | Freq: Every day | ORAL | 1 refills | Status: DC

## 2022-01-04 ENCOUNTER — Ambulatory Visit: Payer: MEDICAID | Attending: Internal Medicine | Admitting: Pharmacist

## 2022-01-04 VITALS — BP 121/81

## 2022-01-04 DIAGNOSIS — R030 Elevated blood-pressure reading, without diagnosis of hypertension: Secondary | ICD-10-CM | POA: Diagnosis not present

## 2022-01-04 NOTE — Progress Notes (Signed)
° °  S:    PCP: Dr. Wynetta Emery  Patient presents to the clinic for hypertension evaluation, counseling, and management. Patient was referred and last seen by Primary Care Provider on 10/04/21. We saw him on 12/06/2021. BP was 144/86 so we started amlodipine 5 mg daily.   Patient arrives in good spirits. Denies dizziness, headache, swelling, blurred vision. Reports medication adherence. Has taken his dose this morning.   Current BP Medications include:  amlodipine 5 mg daily.  Antihypertensives tried in the past include: none  Dietary habits include: three meals per day, always adds salt to food Exercise habits include: plays softball Family / Social history:  -Tobacco: never smoker -Fhx: asthma and DM in brother  O:  Vitals:   01/04/22 1052  BP: 121/81   Home BP readings: none  Last 3 Office BP readings: BP Readings from Last 3 Encounters:  01/04/22 121/81  12/06/21 (!) 144/86  11/03/21 126/86   BMET    Component Value Date/Time   NA 141 01/26/2021 1155   K 4.6 01/26/2021 1155   CL 102 01/26/2021 1155   CO2 22 01/26/2021 1155   GLUCOSE 77 01/26/2021 1155   GLUCOSE 118 (H) 01/07/2021 0756   BUN 10 01/26/2021 1155   CREATININE 0.80 01/26/2021 1155   CREATININE 0.97 01/22/2015 1009   CALCIUM 9.7 01/26/2021 1155   GFRNONAA >60 01/07/2021 0756   GFRNONAA >89 01/22/2015 1009   GFRAA 96 10/19/2020 1012   GFRAA >89 01/22/2015 1009    Renal function: CrCl cannot be calculated (Patient's most recent lab result is older than the maximum 21 days allowed.).  Clinical ASCVD: No  The 10-year ASCVD risk score (Arnett DK, et al., 2019) is: 2.2%   Values used to calculate the score:     Age: 27 years     Sex: Male     Is Non-Hispanic African American: No     Diabetic: No     Tobacco smoker: No     Systolic Blood Pressure: 161 mmHg     Is BP treated: No     HDL Cholesterol: 39 mg/dL     Total Cholesterol: 149 mg/dL   A/P: Hypertension currently controlled on amlodipine. BP  goal <130/80 mmHg. Medication adherence appears optimal.  -Continue amlodipine 5 mg daily -Counseled patient extensively on DASH diet  -Counseled on lifestyle modifications for blood pressure control including reduced dietary sodium, increased exercise, adequate sleep.  Total time in face-to-face counseling 20 minutes. Next PCP visit in May.   Benard Halsted, PharmD, [place], [place] [phone]

## 2022-02-26 IMAGING — CT CT CHEST W/O CM
2 of 4 series · 14 of 36 positions shown, 17 images · non-contrast
Comparison: Chest CT 10/14/2020.

CLINICAL DATA: 49-year-old male with history of mediastinal
lymphadenopathy. Follow-up study.

EXAM:
CT CHEST WITHOUT CONTRAST
TECHNIQUE: Multidetector CT imaging of the chest was performed following the
standard protocol without IV contrast.

[Series 2: thorax · axial · 0.77mm/px · z∈[+1492,+1726]mm · 11 of 139 slices shown, 14 images]
[im 11/139  mediastinal]
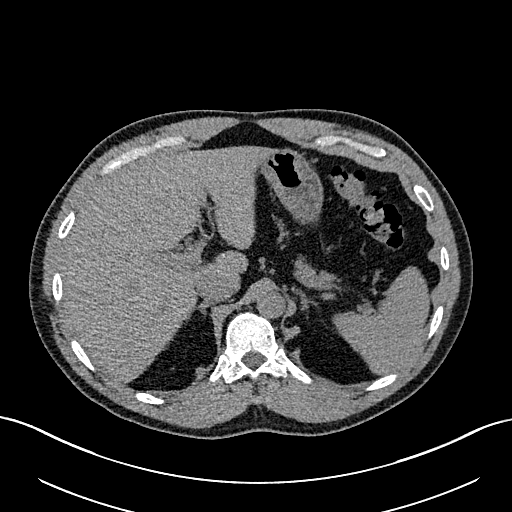
[im 11/139  lung]
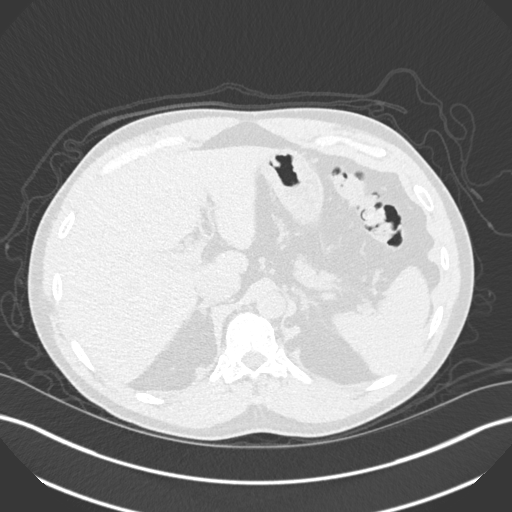
[im 22/139  lung]
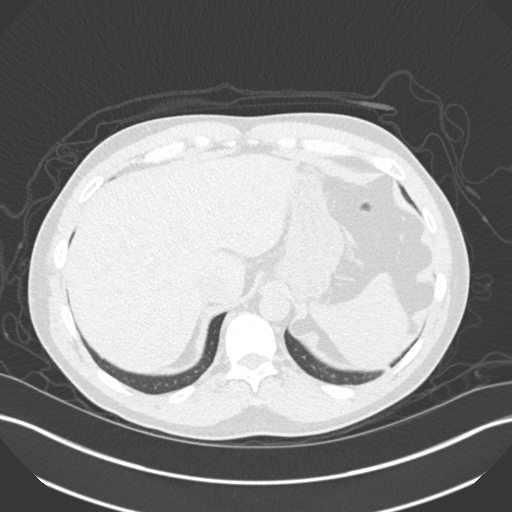
[im 32/139  lung]
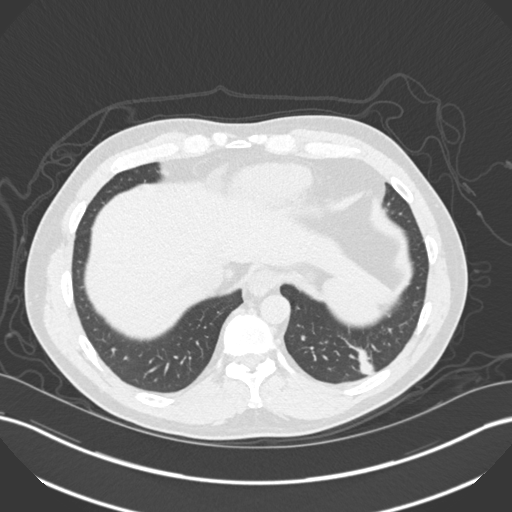
[im 43/139  lung]
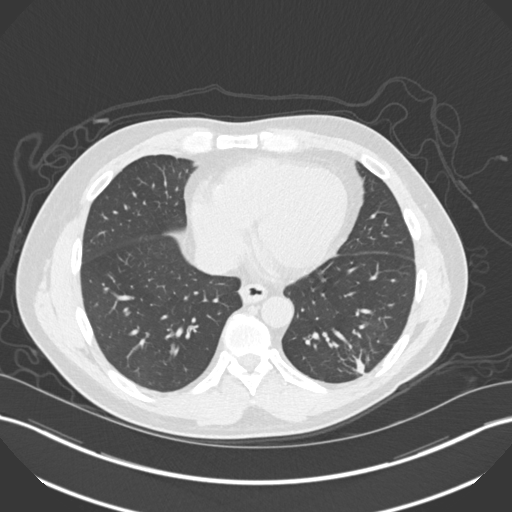
[im 54/139  mediastinal]
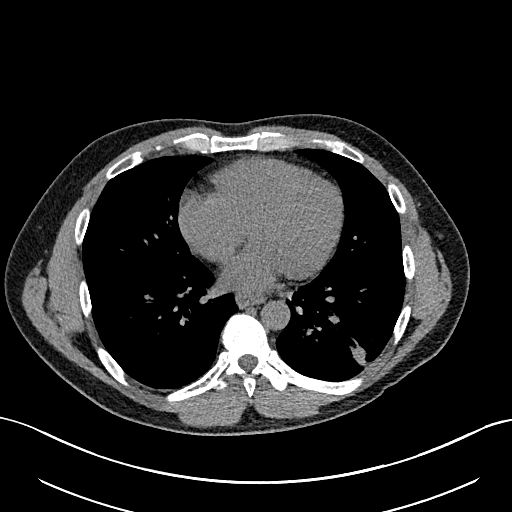
[im 54/139  lung]
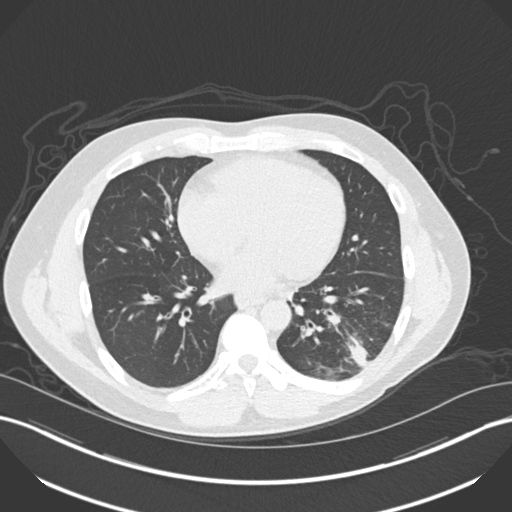
[im 75/139  lung]
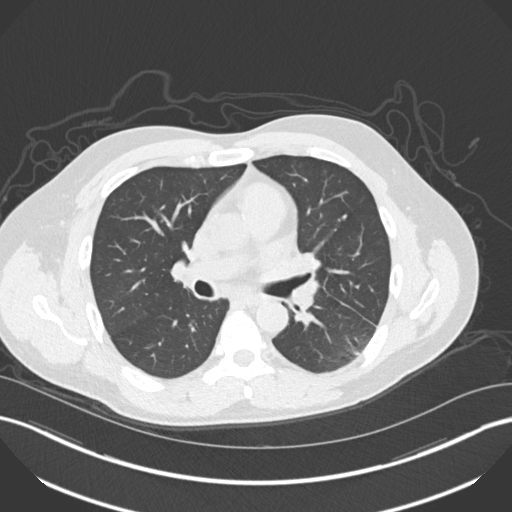
[im 85/139  lung]
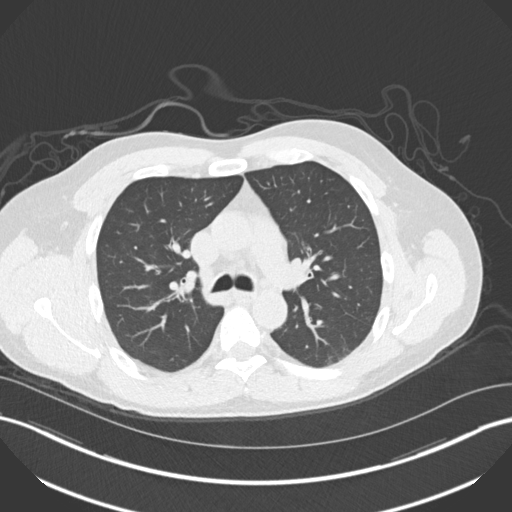
[im 96/139  lung]
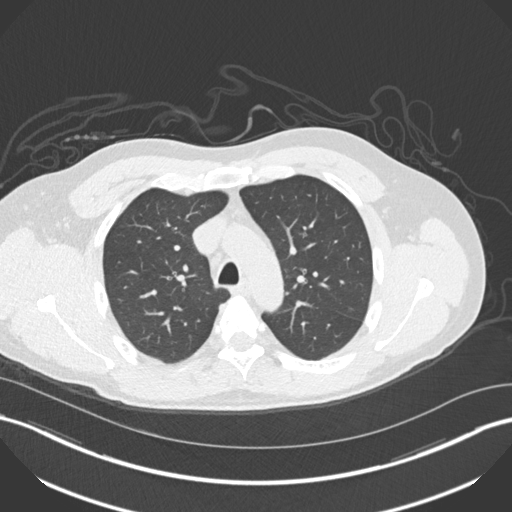
[im 107/139  mediastinal]
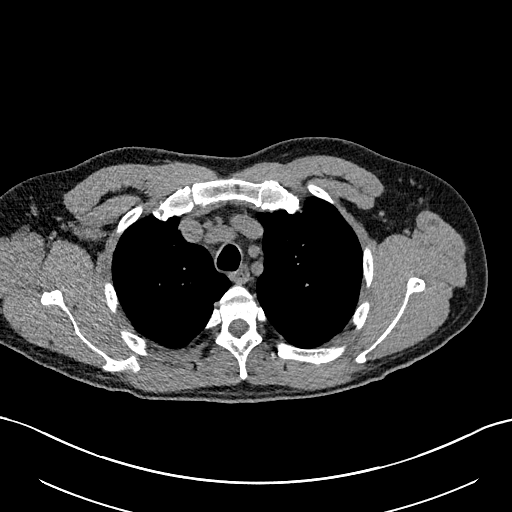
[im 107/139  lung]
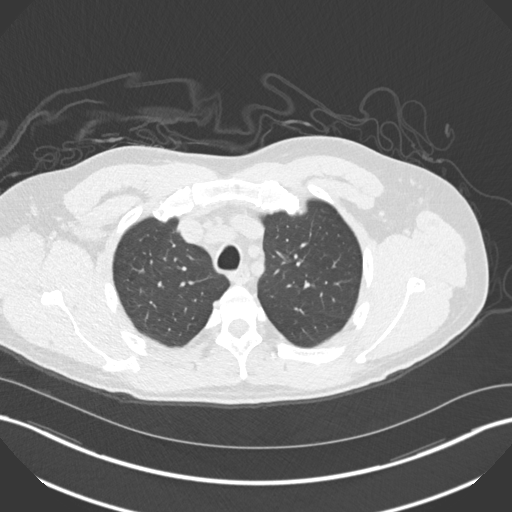
[im 117/139  lung]
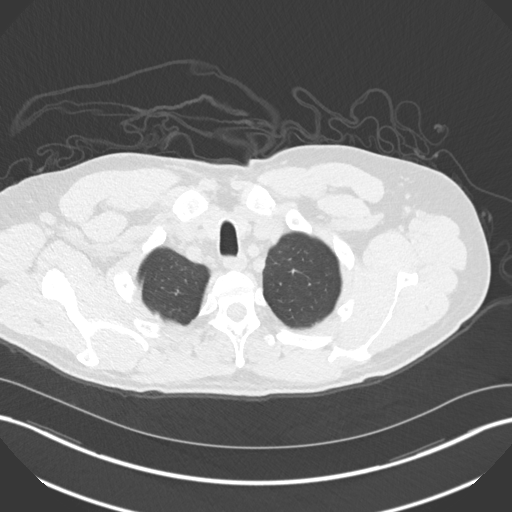
[im 128/139  lung]
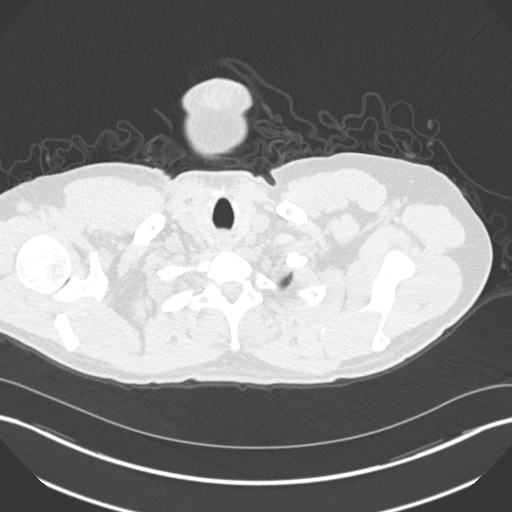

[Series 6: coronal · coronal · 0.60mm/px · 3 of 115 slices shown]
[im 23/115  lung]
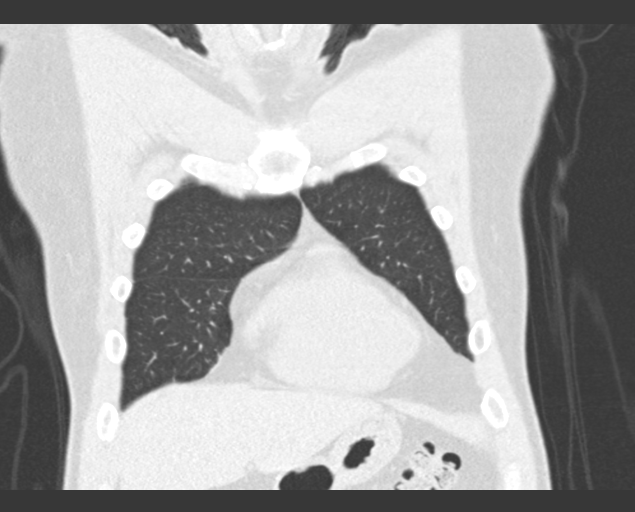
[im 46/115  lung]
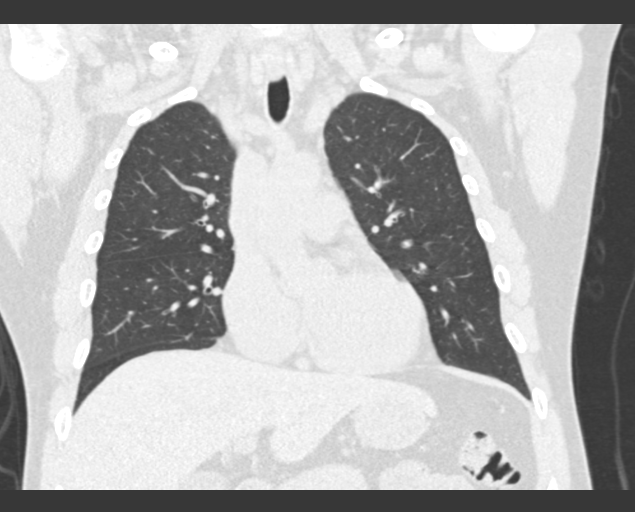
[im 69/115  lung]
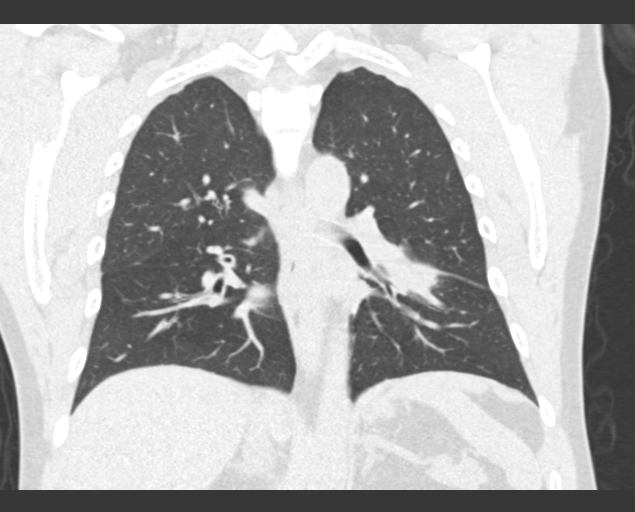

[14 of 36 positions shown; findings below may reference images not displayed]

FINDINGS: Cardiovascular: Heart size is normal. There is no significant
pericardial fluid, thickening or pericardial calcification. There is
aortic atherosclerosis, as well as atherosclerosis of the great
vessels of the mediastinum and the coronary arteries, including
calcified atherosclerotic plaque in the left anterior descending
coronary artery.

Mediastinum/Nodes: No pathologically enlarged mediastinal or hilar
lymph nodes. Please note that accurate exclusion of hilar adenopathy
is limited on noncontrast CT scans. Esophagus is unremarkable in
appearance. No axillary lymphadenopathy.

Lungs/Pleura: Previously noted area of airspace consolidation is
less extensive, and appears to have contracted slightly, however,
there continues to be a masslike area of architectural distortion in
its [place], currently measuring 5.5 x 3.5 cm. Linear scarring extends
caudally from this region into the basal segments of the left lower
lobe posteriorly. No right-sided airspace consolidation. No pleural
effusions. No other definite suspicious appearing pulmonary nodules
or masses are noted.

Upper Abdomen: Unremarkable.

Musculoskeletal: There are no aggressive appearing lytic or blastic
lesions noted in the visualized portions of the skeleton.
IMPRESSION: 1. Probable resolving left lower lobe pneumonia with residual
rounded area of persistent consolidation and likely developing
scarring. Close attention on follow-up studies is strongly
recommended to ensure continued regression of these findings, as the
possibility of underlying neoplasm is not entirely excluded. Repeat
noncontrast chest CT is recommended in 2 months.
2. No definite lymphadenopathy identified on today's noncontrast CT
examination.
3. Aortic atherosclerosis, in addition to left anterior descending
coronary artery disease. Please note that although the presence of
coronary artery calcium documents the presence of coronary artery
disease, the severity of this disease and any potential stenosis
cannot be assessed on this non-gated CT examination. Assessment for
potential risk factor modification, dietary therapy or pharmacologic
therapy may be warranted, if clinically indicated.

Aortic Atherosclerosis ([id number]).

## 2022-02-28 ENCOUNTER — Other Ambulatory Visit: Payer: Self-pay | Admitting: Internal Medicine

## 2022-02-28 DIAGNOSIS — J4540 Moderate persistent asthma, uncomplicated: Secondary | ICD-10-CM

## 2022-03-01 NOTE — Telephone Encounter (Signed)
Requested medication (s) are due for refill today: yes ? ?Requested medication (s) are on the active medication list: yes ? ?Last refill:  09/06/21 #60/4 ? ?Future visit scheduled: yes ? ?Notes to clinic:  Unable to refill per protocol, medication not assigned to the refill protocol. ? ? ? ?  ?Requested Prescriptions  ?Pending Prescriptions Disp Refills  ? BREO ELLIPTA 200-25 MCG/ACT AEPB [Pharmacy Med Name: BREO ELLIPTA 200-25MCG ORAL INH(30)] 60 each 4  ?  Sig: INHALE 1 PUFF INTO THE LUNGS DAILY  ?  ? There is no refill protocol information for this order  ?  ? ?

## 2022-03-28 IMAGING — DX DG CHEST 1V PORT
1 series · 1 of 1 positions shown · non-contrast
Comparison: CT 12/31/2020.  Chest x-ray 12/29/2020.

CLINICAL DATA: Loculated pleural effusion.

EXAM:
PORTABLE CHEST 1 VIEW

[chest ap]
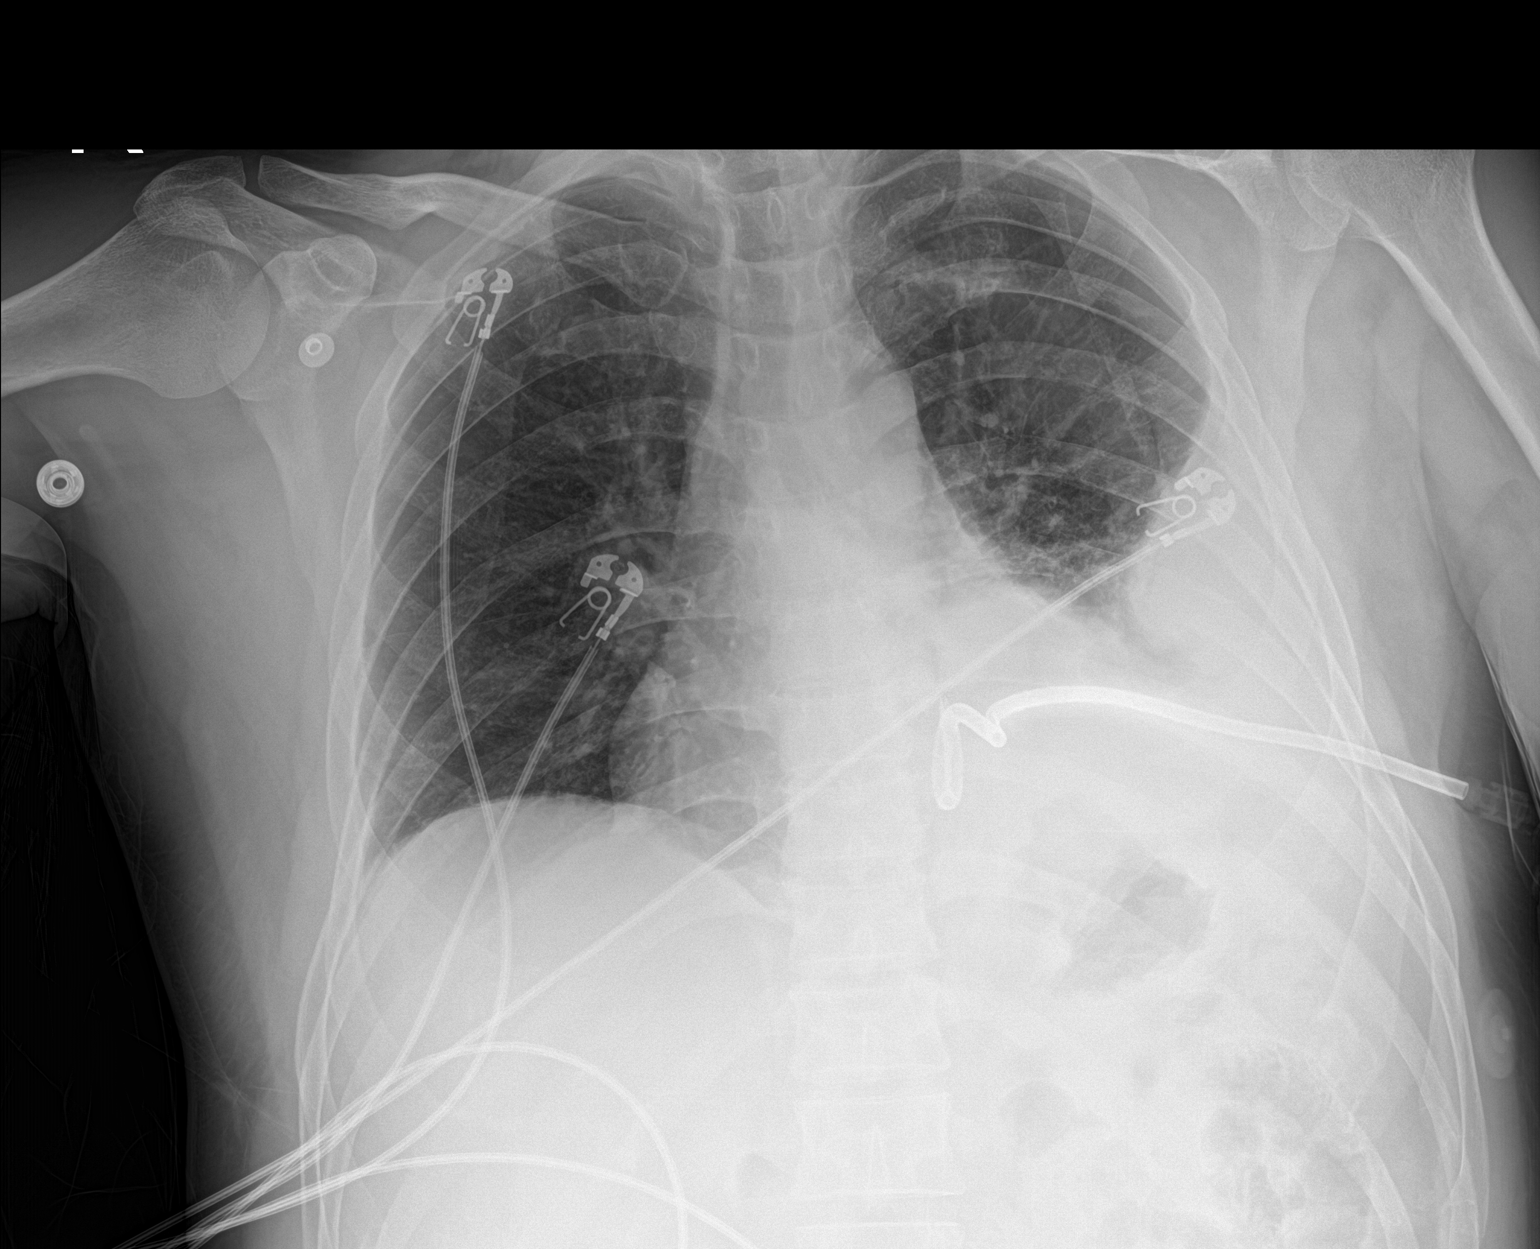

[1 of 1 positions shown; findings below may reference images not displayed]

FINDINGS: Left chest tube noted over the left lower chest. Prominent left
pleural effusion with possible loculation. Underlying left base
atelectasis/infiltrate most likely present. No pneumothorax. Heart
size stable.
IMPRESSION: Left chest tube noted over the left lower chest. Prominent left
pleural effusion with possible loculation. Underlying left base
atelectasis/infiltrate most likely present. No pneumothorax.

## 2022-03-31 IMAGING — DX DG CHEST 1V PORT
1 series · 1 of 1 positions shown · non-contrast
Comparison: January 02, 2021.

CLINICAL DATA: Chest pain.

EXAM:
PORTABLE CHEST 1 VIEW

[chest ap]
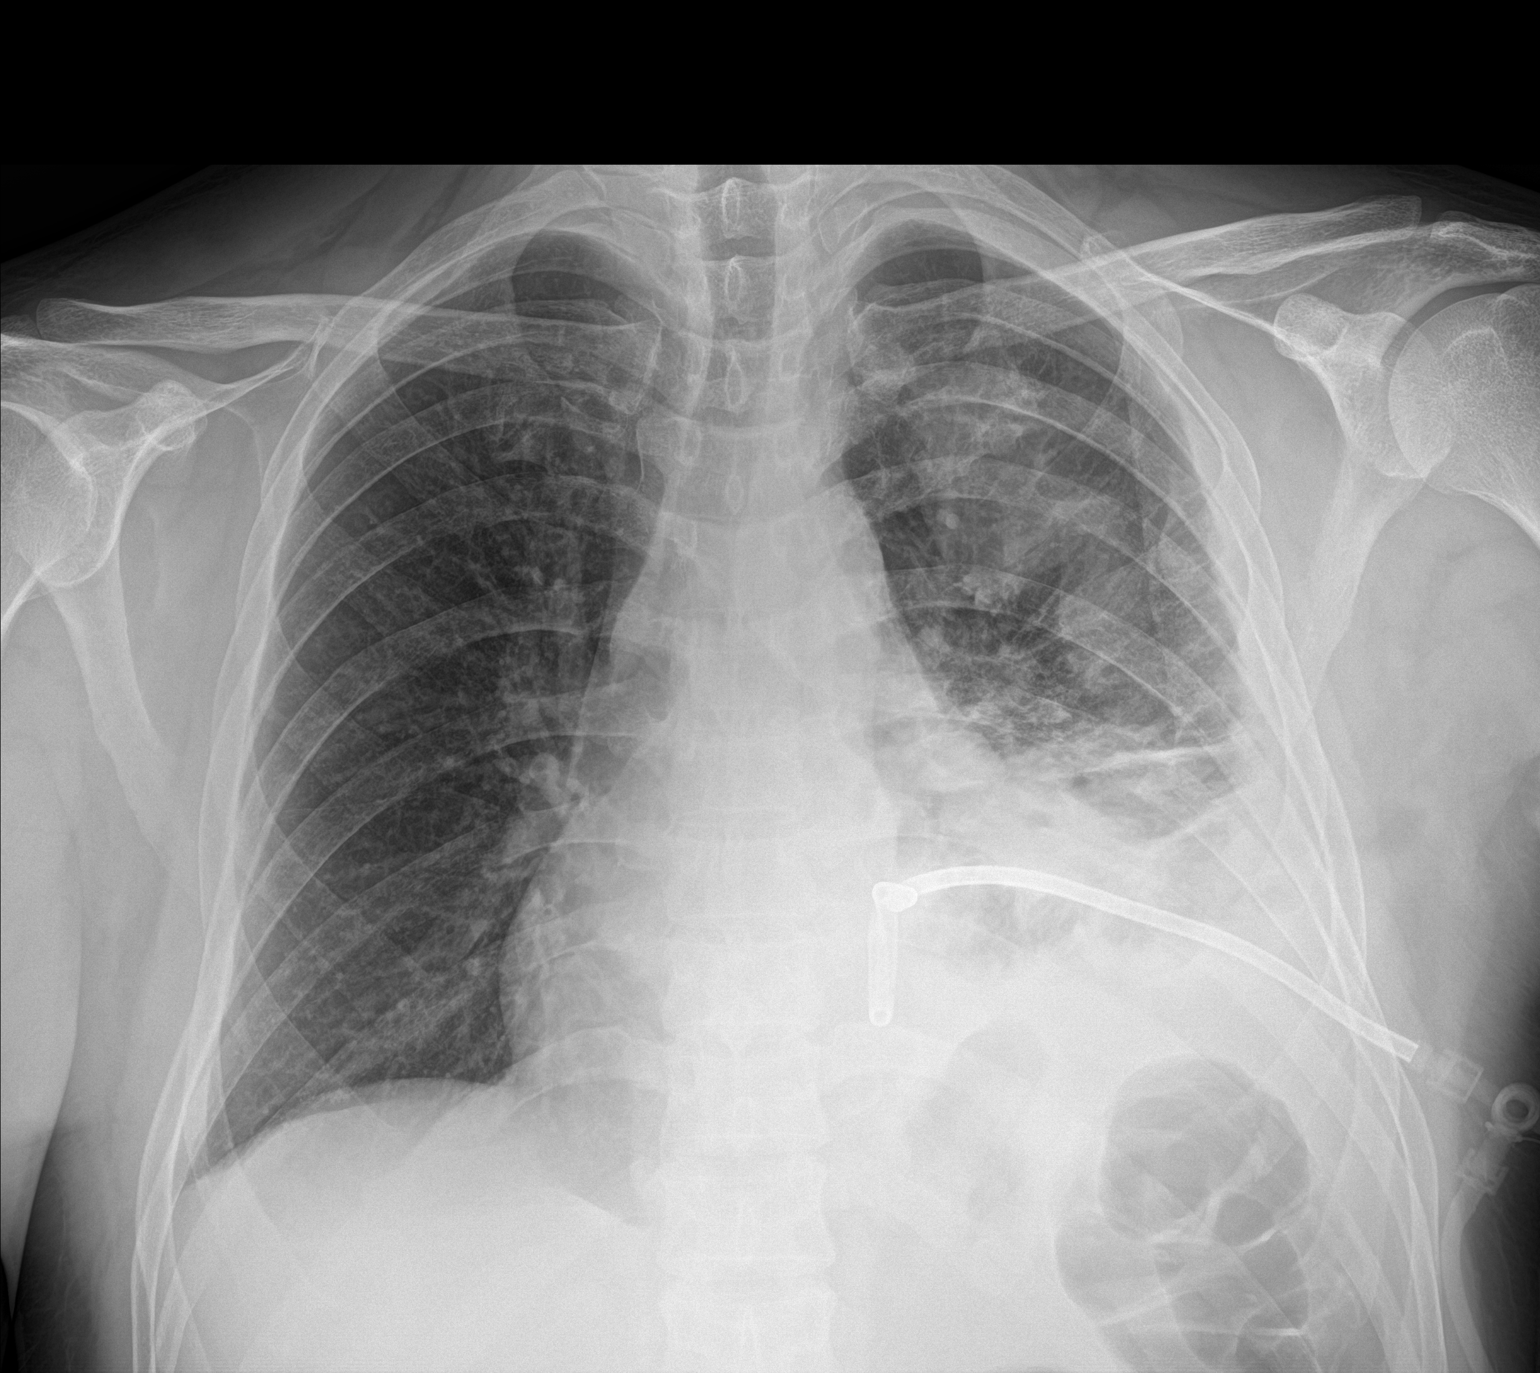

[1 of 1 positions shown; findings below may reference images not displayed]

FINDINGS: Stable cardiomegaly. Right lung is clear. Stable position of
left-sided chest tube is noted with grossly stable left pleural
effusion and associated atelectasis or infiltrate. Bony thorax is
unremarkable. No pneumothorax is noted.
IMPRESSION: Stable position of left-sided chest tube with grossly stable left
pleural effusion and associated atelectasis or infiltrate.

## 2022-04-04 ENCOUNTER — Ambulatory Visit: Payer: MEDICAID | Attending: Internal Medicine | Admitting: Internal Medicine

## 2022-04-04 ENCOUNTER — Encounter: Payer: Self-pay | Admitting: Internal Medicine

## 2022-04-04 VITALS — BP 125/80 | HR 59 | Temp 98.0°F | Resp 16 | Ht 63.0 in | Wt 155.6 lb

## 2022-04-04 DIAGNOSIS — R634 Abnormal weight loss: Secondary | ICD-10-CM

## 2022-04-04 DIAGNOSIS — J4540 Moderate persistent asthma, uncomplicated: Secondary | ICD-10-CM | POA: Diagnosis not present

## 2022-04-04 DIAGNOSIS — E663 Overweight: Secondary | ICD-10-CM | POA: Diagnosis not present

## 2022-04-04 DIAGNOSIS — I700 Atherosclerosis of aorta: Secondary | ICD-10-CM | POA: Diagnosis not present

## 2022-04-04 DIAGNOSIS — Z1159 Encounter for screening for other viral diseases: Secondary | ICD-10-CM | POA: Diagnosis not present

## 2022-04-04 DIAGNOSIS — I10 Essential (primary) hypertension: Secondary | ICD-10-CM

## 2022-04-04 MED ORDER — FLUTICASONE FUROATE-VILANTEROL 200-25 MCG/ACT IN AEPB: 1.0000 | INHALATION_SPRAY | Freq: Every day | RESPIRATORY_TRACT | 11 refills | Status: DC

## 2022-04-04 MED ORDER — ALBUTEROL SULFATE HFA 108 (90 BASE) MCG/ACT IN AERS: 2.0000 | INHALATION_SPRAY | RESPIRATORY_TRACT | 0 refills | Status: DC | PRN

## 2022-04-04 NOTE — Progress Notes (Signed)
? ? ?Patient ID: Joseph Fitzgerald, male    DOB: Jun 05, 1971  MRN: [id number] ? ?CC: Follow-up (6 month) ? ? ?Subjective: ?Joseph Fitzgerald is a 51 y.o. male who presents for chronic disease management.  Wife, Estill Bamberg, is with him today. ?His concerns today include:  ?Pt with mod persistent asthma, obesity, environmental allergies and eczema, loculated pleural effusion left side requiring fibrinolytic therapy through chest tube 12/2020 ?  ?Asthma: reports no recent flares since last visit.  Reports compliance with Breo. Not having to use Albuterol inhaler or neb.  Not taking Singulair.  Not having increase allergy symptoms given that we are in pollen season ?Pt had incidental finding of aortic atherosclerosis on CT chest done 11/2020 ? ?BP elevated on last visit.  DASH diet encouraged.  He followed up with the clinical pharmacist and blood pressure was still elevated so he was started on Norvasc.  He reports compliance with Norvasc.  Tolerating the medication.  His wife does most of the cooking.  She limits salt in the foods. ? ?Wgh down 12 lbs since 08/2021. ?He reports again that wgh loss is intentional. Staying away from sweet drinks except 1 cup sweet tea a day and coconut water.  Eating smaller portions. ?Does a lot of standing and walking at work.  Works 4 days a wk and shifts can be 6-11 hrs.   ?No palpitations, chronic diarrhea, blood in stools, hematuria, dysphagia, chronic abdominal pains.  No fevers/night sweats.  ? ?HM:  Due for COVID booster and Hep C screen ?Patient Active Problem List  ? Diagnosis Date Noted  ? Necrotizing pneumonia ([place]) 09/02/2021  ? Chest tube in place   ? COVID-19 virus infection 12/31/2020  ? Loculated pleural effusion 12/31/2020  ? Asthma   ? Poor dentition   ? Aortic atherosclerosis ([place])   ? Mass of lower lobe of left lung   ? Marijuana abuse   ? Tobacco abuse   ? Dental caries 12/07/2020  ? Hypokalemia 03/17/2018  ? Hyperglycemia 03/17/2018  ? Asthma, moderate persistent 08/17/2016  ?  Vitamin D deficiency 09/01/2015  ? Family history of diabetes mellitus in brother 08/31/2015  ? Eczema 08/31/2015  ? Environmental allergies 04/16/2014  ?  ? ?Current Outpatient Medications on File Prior to Visit  ?Medication Sig Dispense Refill  ? amLODipine (NORVASC) 5 MG tablet Take 1 tablet (5 mg total) by mouth daily. 90 tablet 1  ? ipratropium-albuterol (DUONEB) 0.5-2.5 (3) MG/3ML SOLN Take 3 mLs by nebulization every 6 (six) hours. 360 mL 1  ? montelukast (SINGULAIR) 10 MG tablet Take 1 tablet (10 mg total) by mouth at bedtime. 30 tablet 11  ? olopatadine (PATANOL) 0.1 % ophthalmic solution Place 1 drop into both eyes 2 (two) times daily. 5 mL 0  ? ?No current facility-administered medications on file prior to visit.  ? ? ?Allergies  ?Allergen Reactions  ? Penicillins Rash  ?  Did it involve swelling of the face/tongue/throat, SOB, or low BP?No ?Did it involve sudden or severe rash/hives, skin peeling, or any reaction on the inside of your mouth or nose?Yes ?Did you need to seek medical attention at a hospital or doctor's office? no ?When did it last happen? Child ?If all above answers are ?NO?, may proceed with cephalosporin use.  ? ? ?Social History  ? ?Socioeconomic History  ? Marital status: Single  ?  Spouse name: Not on file  ? Number of children: 3  ? Years of education: 84  ? Highest education level: Not  on file  ?Occupational History  ? Occupation: [employer]   ?Tobacco Use  ? Smoking status: Never  ? Smokeless tobacco: Former  ?  Types: Chew  ?Vaping Use  ? Vaping Use: Never used  ?Substance and Sexual Activity  ? Alcohol use: Not Currently  ?  Comment: beer  ? Drug use: Not Currently  ?  Types: Marijuana  ? Sexual activity: Yes  ?Other Topics Concern  ? Not on file  ?Social History Narrative  ? Not on file  ? ?Social Determinants of Health  ? ?Financial Resource Strain: Not on file  ?Food Insecurity: Not on file  ?Transportation Needs: Not on file  ?Physical Activity: Not on file  ?Stress: Not on file   ?Social Connections: Not on file  ?Intimate Partner Violence: Not on file  ? ? ?Family History  ?Problem Relation Age of Onset  ? Asthma Brother   ? Diabetes Brother   ? ? ?Past Surgical History:  ?Procedure Laterality Date  ? NO PAST SURGERIES    ? ? ?ROS: ?Review of Systems ?Negative except as stated above ? ?PHYSICAL EXAM: ?BP 125/80   Pulse (!) 59   Temp 98 ?F (36.7 ?C) (Oral)   Resp 16   Ht 5' 3"  (1.6 m)   Wt 155 lb 9.6 oz (70.6 kg)   SpO2 96%   BMI 27.56 kg/m?   ?Wt Readings from Last 3 Encounters:  ?04/04/22 155 lb 9.6 oz (70.6 kg)  ?10/04/21 160 lb 12.8 oz (72.9 kg)  ?09/02/21 167 lb 12.8 oz (76.1 kg)  ? ? ?Physical Exam ? ? ?General appearance - alert, well appearing, middle-aged male in NAD  ?Mental status - normal mood, behavior, speech, dress, motor activity, and thought processes ?Neck - supple, no significant adenopathy ?Chest - clear to auscultation, no wheezes, rales or rhonchi, symmetric air entry ?Heart - normal rate, regular rhythm, normal S1, S2, no murmurs, rubs, clicks or gallops ?Extremities - peripheral pulses normal, no pedal edema, no clubbing or cyanosis ?Skin -eczematous changes on the hands. ? ? ?  Latest Ref Rng & Units 01/26/2021  ? 11:55 AM 01/07/2021  ?  7:56 AM 01/05/2021  ? 12:52 AM  ?CMP  ?Glucose 65 - 99 mg/dL 77   118   124    ?BUN 6 - 24 mg/dL 10   7   10     ?Creatinine 0.76 - 1.27 mg/dL 0.80   0.67   0.72    ?Sodium 134 - 144 mmol/L 141   138   135    ?Potassium 3.5 - 5.2 mmol/L 4.6   3.8   3.4    ?Chloride 96 - 106 mmol/L 102   103   100    ?CO2 20 - 29 mmol/L 22   25   26     ?Calcium 8.7 - 10.2 mg/dL 9.7   8.1   7.8    ?Total Protein 6.0 - 8.5 g/dL 8.3    6.3    ?Total Bilirubin 0.0 - 1.2 mg/dL 0.4    0.5    ?Alkaline Phos 44 - 121 IU/L 128    56    ?AST 0 - 40 IU/L 23    22    ?ALT 0 - 44 IU/L 26    16    ? ?Lipid Panel  ?   ?Component Value Date/Time  ? CHOL 149 12/31/2020 0906  ? CHOL 196 01/04/2018 1540  ? TRIG 32 12/31/2020 0906  ? HDL 39 (L) 12/31/2020  0086  ? HDL  44 01/04/2018 1540  ? CHOLHDL 3.8 12/31/2020 0906  ? VLDL 6 12/31/2020 0906  ? [place] 104 (H) 12/31/2020 7619  ? LDLCALC 114 (H) 01/04/2018 1540  ? ? ?CBC ?   ?Component Value Date/Time  ? WBC 5.2 01/26/2021 1155  ? WBC 8.2 01/07/2021 0756  ? RBC 5.04 01/26/2021 1155  ? RBC 4.41 01/07/2021 0756  ? HGB 14.9 01/26/2021 1155  ? HCT 44.4 01/26/2021 1155  ? PLT 246 01/26/2021 1155  ? MCV 88 01/26/2021 1155  ? MCH 29.6 01/26/2021 1155  ? MCH 30.4 01/07/2021 0756  ? MCHC 33.6 01/26/2021 1155  ? MCHC 34.3 01/07/2021 0756  ? RDW 15.2 01/26/2021 1155  ? LYMPHSABS 1.0 01/05/2021 0052  ? LYMPHSABS 2.4 10/19/2020 1012  ? MONOABS 1.3 (H) 01/05/2021 0052  ? EOSABS 0.1 01/05/2021 0052  ? EOSABS 0.5 (H) 10/19/2020 1012  ? BASOSABS 0.0 01/05/2021 0052  ? BASOSABS 0.1 10/19/2020 1012  ? ? ?ASSESSMENT AND PLAN: ?1. Moderate persistent asthma without complication ?Controlled.  Continue Breo.  Advised to restart Singulair if he starts experiencing any allergy symptoms given that we are in high pollen season. ?- fluticasone furoate-vilanterol (BREO ELLIPTA) 200-25 MCG/ACT AEPB; Inhale 1 puff into the lungs daily.  Dispense: 60 each; Refill: 11 ?- albuterol (VENTOLIN HFA) 108 (90 Base) MCG/ACT inhaler; Inhale 2 puffs into the lungs every 4 (four) hours as needed for wheezing or shortness of breath.  Dispense: 18 g; Refill: 0 ? ?2. Overweight (BMI 25.0-29.9) ?Commended him on weight loss.  Encouraged him to continue to eat healthy and continue trying to move as much as he can. ? ?3. Aortic atherosclerosis ([place]) ?- Lipid panel ? ?4. Need for hepatitis C screening test ?Patient agreeable for hepatitis C screening. ?- Hepatitis C Antibody ? ?5. Weight loss ?Reports it is intentional but we will still check a TSH ?- TSH ? ?6. Essential hypertension ?At goal.  Continue amlodipine. ?- CBC ?- Comprehensive metabolic panel ? ? ?Patient was given the opportunity to ask questions.  Patient verbalized understanding of the plan and was able to repeat  key elements of the plan.  ? ?This documentation was completed using Radio producer.  Any transcriptional errors are unintentional. ? ?Orders Placed This Encounter  ?Procedures  ? CBC  ? Compreh

## 2022-04-04 NOTE — Progress Notes (Signed)
Patient is here for 6 month follow-up. ?Patient said he has no new concerns for provider today Melene Plan ? ?

## 2022-04-05 LAB — COMPREHENSIVE METABOLIC PANEL
ALT: 13 IU/L (ref 0–44)
AST: 22 IU/L (ref 0–40)
Albumin/Globulin Ratio: 1.7 (ref 1.2–2.2)
Albumin: 4.6 g/dL (ref 4.0–5.0)
Alkaline Phosphatase: 129 IU/L — ABNORMAL HIGH (ref 44–121)
BUN/Creatinine Ratio: 16 (ref 9–20)
BUN: 15 mg/dL (ref 6–24)
Bilirubin Total: 0.3 mg/dL (ref 0.0–1.2)
CO2: 23 mmol/L (ref 20–29)
Calcium: 9.5 mg/dL (ref 8.7–10.2)
Chloride: 102 mmol/L (ref 96–106)
Creatinine, Ser: 0.91 mg/dL (ref 0.76–1.27)
Globulin, Total: 2.7 g/dL (ref 1.5–4.5)
Glucose: 101 mg/dL — ABNORMAL HIGH (ref 70–99)
Potassium: 5.1 mmol/L (ref 3.5–5.2)
Sodium: 142 mmol/L (ref 134–144)
Total Protein: 7.3 g/dL (ref 6.0–8.5)
eGFR: 104 mL/min/{1.73_m2} (ref >59)

## 2022-04-05 LAB — LIPID PANEL
Chol/HDL Ratio: 4 ratio (ref 0.0–5.0)
Cholesterol, Total: 239 mg/dL — ABNORMAL HIGH (ref 100–199)
HDL: 60 mg/dL (ref >39)
LDL Chol Calc (NIH): 151 mg/dL — ABNORMAL HIGH (ref 0–99)
Triglycerides: 154 mg/dL — ABNORMAL HIGH (ref 0–149)
VLDL Cholesterol Cal: 28 mg/dL (ref 5–40)

## 2022-04-05 LAB — CBC
Hematocrit: 48.2 % (ref 37.5–51.0)
Hemoglobin: 16.7 g/dL (ref 13.0–17.7)
MCH: 32.1 pg (ref 26.6–33.0)
MCHC: 34.6 g/dL (ref 31.5–35.7)
MCV: 93 fL (ref 79–97)
Platelets: 233 10*3/uL (ref 150–450)
RBC: 5.21 x10E6/uL (ref 4.14–5.80)
RDW: 12.7 % (ref 11.6–15.4)
WBC: 4.7 10*3/uL (ref 3.4–10.8)

## 2022-04-05 LAB — HEPATITIS C ANTIBODY: Hep C Virus Ab: NONREACTIVE

## 2022-04-05 LAB — TSH: TSH: 0.922 u[IU]/mL (ref 0.450–4.500)

## 2022-04-05 NOTE — Progress Notes (Signed)
Let patient know that kidney function is normal.  He has mild but stable elevation in one of his liver function tests.  We will continue to monitor.  Thyroid level is normal.  Hepatitis C screen negative.  Cholesterol level is 151 with goal being less than 100.  Healthy eating habits and regular exercise will help to lower cholesterol. ? ?The rest of this is for my information. ?The 10-year ASCVD risk score (Arnett DK, et al., 2019) is: 3.4% ?  Values used to calculate the score: ?    Age: 51 years ?    Sex: Male ?    Is Non-Hispanic African American: No ?    Diabetic: No ?    Tobacco smoker: No ?    Systolic Blood Pressure: 023 mmHg ?    Is BP treated: Yes ?    HDL Cholesterol: 60 mg/dL ?    Total Cholesterol: 239 mg/dL ?

## 2022-04-06 ENCOUNTER — Telehealth: Payer: Self-pay

## 2022-04-06 NOTE — Telephone Encounter (Signed)
Contacted pt to go over lab results pt is aware and doesn't have any questions or concerns 

## 2022-06-24 ENCOUNTER — Other Ambulatory Visit: Payer: Self-pay | Admitting: Internal Medicine

## 2022-06-27 NOTE — Telephone Encounter (Signed)
Requested Prescriptions  Pending Prescriptions Disp Refills  . amLODipine (NORVASC) 5 MG tablet [Pharmacy Med Name: AMLODIPINE BESYLATE 5MG  TABLETS] 90 tablet 1    Sig: TAKE 1 TABLET(5 MG) BY MOUTH DAILY     Cardiovascular: Calcium Channel Blockers 2 Passed - 06/24/2022  5:07 PM      Passed - Last BP in normal range    BP Readings from Last 1 Encounters:  04/04/22 125/80         Passed - Last Heart Rate in normal range    Pulse Readings from Last 1 Encounters:  04/04/22 (!) 59         Passed - Valid encounter within last 6 months    Recent Outpatient Visits          2 months ago Moderate persistent asthma without complication   [place] 06/04/22 [facility] [place], SAN REMO B, MD   5 months ago Elevated blood-pressure reading without diagnosis of hypertension   [facility] [place], KOOMBERKINE L, RPH-CPP   6 months ago Elevated blood-pressure reading without diagnosis of hypertension   [facility] [place], KOOMBERKINE L, RPH-CPP   7 months ago Elevated blood-pressure reading without diagnosis of hypertension   [facility] [place], KOOMBERKINE, RPH-CPP   8 months ago Moderate persistent asthma without complication   [facility], MD      Future Appointments            In 1 month Marcine Matar, [name], MD [facility]

## 2022-08-05 ENCOUNTER — Ambulatory Visit: Payer: 59 | Attending: Internal Medicine | Admitting: Internal Medicine

## 2022-08-05 ENCOUNTER — Encounter: Payer: Self-pay | Admitting: Internal Medicine

## 2022-08-05 VITALS — BP 114/71 | HR 57 | Temp 98.1°F | Ht 62.0 in | Wt 161.2 lb

## 2022-08-05 DIAGNOSIS — J4540 Moderate persistent asthma, uncomplicated: Secondary | ICD-10-CM

## 2022-08-05 DIAGNOSIS — Z6829 Body mass index (BMI) 29.0-29.9, adult: Secondary | ICD-10-CM

## 2022-08-05 DIAGNOSIS — E663 Overweight: Secondary | ICD-10-CM

## 2022-08-05 DIAGNOSIS — Z23 Encounter for immunization: Secondary | ICD-10-CM

## 2022-08-05 DIAGNOSIS — I10 Essential (primary) hypertension: Secondary | ICD-10-CM | POA: Diagnosis not present

## 2022-08-05 NOTE — Progress Notes (Signed)
Patient ID: Joseph Fitzgerald, male    DOB: May 28, 1971  MRN: [id number]  CC: Hypertension   Subjective: Joseph Fitzgerald is a 51 y.o. male who presents for chronic disease management. His concerns today include:  Pt with mod persistent asthma, obesity, environmental allergies and eczema, loculated pleural effusion left side requiring fibrinolytic therapy through chest tube 12/2020   Asthma: no recent flares.  Reports that he is doing well on Breo inhaler.  HTN: taking Norvasc and took already today.  He limits salt in the foods.  No chest pains or shortness of breath.  No headaches or dizziness.  No lower extremity edema.  Overweight:  up 6 lbs since last visit.  Wearing heavy boots today Reports he is still doing well with eating habits.  However on further questioning, he reports that he drinks one small cup sweet tea a day.  Admits that he sometimes eat 2nds with his dinner Walking 2 days a wk on days for 30 mins.   Patient Active Problem List   Diagnosis Date Noted   Necrotizing pneumonia ([facility]) 09/02/2021   Chest tube in place    COVID-19 virus infection 12/31/2020   Loculated pleural effusion 12/31/2020   Asthma    Poor dentition    Aortic atherosclerosis ([facility])    Mass of lower lobe of left lung    Marijuana abuse    Tobacco abuse    Dental caries 12/07/2020   Hypokalemia 03/17/2018   Hyperglycemia 03/17/2018   Asthma, moderate persistent 08/17/2016   Vitamin D deficiency 09/01/2015   Family history of diabetes mellitus in brother 08/31/2015   Eczema 08/31/2015   Environmental allergies 04/16/2014     Current Outpatient Medications on File Prior to Visit  Medication Sig Dispense Refill   albuterol (VENTOLIN HFA) 108 (90 Base) MCG/ACT inhaler Inhale 2 puffs into the lungs every 4 (four) hours as needed for wheezing or shortness of breath. 18 g 0   amLODipine (NORVASC) 5 MG tablet TAKE 1 TABLET(5 MG) BY MOUTH DAILY 90 tablet 1   fluticasone furoate-vilanterol (BREO ELLIPTA)  200-25 MCG/ACT AEPB Inhale 1 puff into the lungs daily. 60 each 11   ipratropium-albuterol (DUONEB) 0.5-2.5 (3) MG/3ML SOLN Take 3 mLs by nebulization every 6 (six) hours. 360 mL 1   montelukast (SINGULAIR) 10 MG tablet Take 1 tablet (10 mg total) by mouth at bedtime. 30 tablet 11   olopatadine (PATANOL) 0.1 % ophthalmic solution Place 1 drop into both eyes 2 (two) times daily. 5 mL 0   No current facility-administered medications on file prior to visit.    Allergies  Allergen Reactions   Penicillins Rash    Did it involve swelling of the face/tongue/throat, SOB, or low BP?No Did it involve sudden or severe rash/hives, skin peeling, or any reaction on the inside of your mouth or nose?Yes Did you need to seek medical attention at a hospital or doctor's office? no When did it last happen? Child If all above answers are "NO", may proceed with cephalosporin use.    Social History   Socioeconomic History   Marital status: Single    Spouse name: Not on file   Number of children: 3   Years of education: 12   Highest education level: Not on file  Occupational History   Occupation: Cook   Tobacco Use   Smoking status: Never   Smokeless tobacco: Former    Types: Associate Professor Use: Never used  Substance and Sexual Activity  Alcohol use: Not Currently    Comment: beer   Drug use: Not Currently    Types: Marijuana   Sexual activity: Yes  Other Topics Concern   Not on file  Social History Narrative   Not on file   Social Determinants of Health   Financial Resource Strain: Not on file  Food Insecurity: Not on file  Transportation Needs: Not on file  Physical Activity: Inactive (03/17/2018)   Exercise Vital Sign    Days of Exercise per Week: 0 days    Minutes of Exercise per Session: 0 min  Stress: No Stress Concern Present (03/17/2018)   [employer] of Occupational Health - Occupational Stress Questionnaire    Feeling of Stress : Not at all  Social  Connections: Moderately Integrated (03/17/2018)   Social Connection and Isolation Panel [NHANES]    Frequency of Communication with Friends and Family: Twice a week    Frequency of Social Gatherings with Friends and Family: Twice a week    Attends Religious Services: More than 4 times per year    Active Member of [employer] or Organizations: No    Attends Banker Meetings: Never    Marital Status: Married  Catering manager Violence: Not At Risk (03/17/2018)   Humiliation, Afraid, Rape, and Kick questionnaire    Fear of Current or Ex-Partner: No    Emotionally Abused: No    Physically Abused: No    Sexually Abused: No    Family History  Problem Relation Age of Onset   Asthma Brother    Diabetes Brother     Past Surgical History:  Procedure Laterality Date   NO PAST SURGERIES      ROS: Review of Systems Negative except as stated above  PHYSICAL EXAM: BP 114/71   Pulse (!) 57   Temp 98.1 F (36.7 C) (Oral)   Ht 5\' 2"  (1.575 m)   Wt 161 lb 3.2 oz (73.1 kg)   SpO2 98%   BMI 29.48 kg/m   Wt Readings from Last 3 Encounters:  08/05/22 161 lb 3.2 oz (73.1 kg)  04/04/22 155 lb 9.6 oz (70.6 kg)  10/04/21 160 lb 12.8 oz (72.9 kg)    Physical Exam  General appearance - alert, well appearing, middle-age male and in no distress Mental status - normal mood, behavior, speech, dress, motor activity, and thought processes Neck - supple, no significant adenopathy Chest - clear to auscultation, no wheezes, rales or rhonchi, symmetric air entry Heart - normal rate, regular rhythm, normal S1, S2, no murmurs, rubs, clicks or gallops Extremities - peripheral pulses normal, no pedal edema, no clubbing or cyanosis      Latest Ref Rng & Units 04/04/2022    2:39 PM 01/26/2021   11:55 AM 01/07/2021    7:56 AM  CMP  Glucose 70 - 99 mg/dL 03/07/2021  77  597   BUN 6 - 24 mg/dL 15  10  7    Creatinine 0.76 - 1.27 mg/dL 416   3.84   Sodium 134 - 144 mmol/L 142  141  138   Potassium  3.5 - 5.2 mmol/L 5.1  4.6  3.8   Chloride 96 - 106 mmol/L 102  102  103   CO2 20 - 29 mmol/L 23  22  25    Calcium 8.7 - 10.2 mg/dL 9.5  9.7  8.1   Total Protein 6.0 - 8.5 g/dL 7.3  8.3    Total Bilirubin 0.0 - 1.2 mg/dL 0.3  0.4  Alkaline Phos 44 - 121 IU/L 129  128    AST 0 - 40 IU/L 22  23    ALT 0 - 44 IU/L 13  26     Lipid Panel     Component Value Date/Time   CHOL 239 (H) 04/04/2022 1439   TRIG 154 (H) 04/04/2022 1439   HDL 60 04/04/2022 1439   CHOLHDL 4.0 04/04/2022 1439   CHOLHDL 3.8 12/31/2020 0906   VLDL 6 12/31/2020 0906   LDLCALC 151 (H) 04/04/2022 1439    CBC    Component Value Date/Time   WBC 4.7 04/04/2022 1439   WBC 8.2 01/07/2021 0756   RBC 5.21 04/04/2022 1439   RBC 4.41 01/07/2021 0756   HGB 16.7 04/04/2022 1439   HCT 48.2 04/04/2022 1439   PLT 233 04/04/2022 1439   MCV 93 04/04/2022 1439   MCH 32.1 04/04/2022 1439   MCH 30.4 01/07/2021 0756   MCHC 34.6 04/04/2022 1439   MCHC 34.3 01/07/2021 0756   RDW 12.7 04/04/2022 1439   LYMPHSABS 1.0 01/05/2021 0052   LYMPHSABS 2.4 10/19/2020 1012   MONOABS 1.3 (H) 01/05/2021 0052   EOSABS 0.1 01/05/2021 0052   EOSABS 0.5 (H) 10/19/2020 1012   BASOSABS 0.0 01/05/2021 0052   BASOSABS 0.1 10/19/2020 1012    ASSESSMENT AND PLAN: 1. Essential hypertension At goal at current dose of amlodipine.  Continue current medication and low-salt diet.  2. Moderate persistent asthma without complication Controlled on Breo.  Continue current inhaler.  3. Overweight (BMI 25.0-29.9) Dietary counseling given.  Advised to eliminate sugary drinks from the diet.  Recommend smaller portion sizes and avoid going back for seconds.  4. Need for immunization against influenza - Flu Vaccine QUAD 19mo+IM (Fluarix, Fluzone & Alfiuria Quad PF)    Patient was given the opportunity to ask questions.  Patient verbalized understanding of the plan and was able to repeat key elements of the plan.   This documentation was completed  using Paediatric nurse.  Any transcriptional errors are unintentional.  Orders Placed This Encounter  Procedures   Flu Vaccine QUAD 42mo+IM (Fluarix, Fluzone & Alfiuria Quad PF)     Requested Prescriptions    No prescriptions requested or ordered in this encounter    No follow-ups on file.  Jonah Blue, MD, FACP

## 2022-08-05 NOTE — Progress Notes (Signed)
Pt requesting flu shot 

## 2022-08-30 ENCOUNTER — Other Ambulatory Visit: Payer: Self-pay | Admitting: Internal Medicine

## 2022-08-30 DIAGNOSIS — J4540 Moderate persistent asthma, uncomplicated: Secondary | ICD-10-CM

## 2022-08-30 NOTE — Telephone Encounter (Signed)
Requested Prescriptions  Pending Prescriptions Disp Refills  . albuterol (VENTOLIN HFA) 108 (90 Base) MCG/ACT inhaler [Pharmacy Med Name: ALBUTEROL HFA INH(200 PUFFS) 18GM] 18 g 2    Sig: INHALE 2 PUFFS INTO THE LUNGS EVERY 4 HOURS AS NEEDED FOR WHEEZING OR SHORTNESS OF BREATH     Pulmonology:  Beta Agonists 2 Passed - 08/30/2022 12:58 PM      Passed - Last BP in normal range    BP Readings from Last 1 Encounters:  08/05/22 114/71         Passed - Last Heart Rate in normal range    Pulse Readings from Last 1 Encounters:  08/05/22 (!) 41         Passed - Valid encounter within last 12 months    Recent Outpatient Visits          3 weeks ago Essential hypertension   [place], MD   4 months ago Moderate persistent asthma without complication   [place], Neoma Laming B, MD   7 months ago Elevated blood-pressure reading without diagnosis of hypertension   [place], Annie Main L, RPH-CPP   8 months ago Elevated blood-pressure reading without diagnosis of hypertension   [place], Annie Main L, RPH-CPP   10 months ago Elevated blood-pressure reading without diagnosis of hypertension   [place], RPH-CPP      Future Appointments            In 3 months Wynetta Emery, [name], MD [place]

## 2022-12-05 ENCOUNTER — Ambulatory Visit: Payer: 59 | Admitting: Internal Medicine

## 2022-12-06 ENCOUNTER — Ambulatory Visit: Payer: 59 | Attending: Internal Medicine | Admitting: Internal Medicine

## 2022-12-06 VITALS — BP 125/81 | HR 61 | Temp 98.3°F | Ht 62.0 in | Wt 157.0 lb

## 2022-12-06 DIAGNOSIS — E782 Mixed hyperlipidemia: Secondary | ICD-10-CM

## 2022-12-06 DIAGNOSIS — I10 Essential (primary) hypertension: Secondary | ICD-10-CM

## 2022-12-06 DIAGNOSIS — J4540 Moderate persistent asthma, uncomplicated: Secondary | ICD-10-CM

## 2022-12-06 DIAGNOSIS — I700 Atherosclerosis of aorta: Secondary | ICD-10-CM

## 2022-12-06 MED ORDER — AMLODIPINE BESYLATE 5 MG PO TABS: ORAL_TABLET | ORAL | 1 refills | Status: DC

## 2022-12-06 NOTE — Progress Notes (Signed)
Patient ID: Joseph Fitzgerald, male    DOB: 15-Aug-1971  MRN: [id number]  CC: Asthma (Asthma f/u. Med refill. Dallie Piles received flu vax this season. )   Subjective: Joseph Fitzgerald is a 52 y.o. male who presents for chronic ds management His concerns today include:  Pt with HTN, HL, mod persistent asthma, obesity, environmental allergies and eczema, loculated pleural effusion left side requiring fibrinolytic therapy through chest tube 12/2020, aortic atherosclerosis on chest CT 11/2020  HTN:  taking and tolerating Norvasc 5 mg Limits salt No device to check BP No CP/SOB/LE edema Asthma:  using Breo daily.  Uses Albuterol inhaler only if he walks a lot or has to run. Has not used neb in months.  Tells me he has not taken Singulair in a while; never found it helpful No recent flare of asthma or allergies. Needs COVID booster  Hx aortic atherosclerosis on chest CT 11/2020.  Lipid profile done in May of last year revealed LDL of 151 with ASCVD of 3.4.  Patient Active Problem List   Diagnosis Date Noted   Necrotizing pneumonia (HCC) 09/02/2021   Chest tube in place    COVID-19 virus infection 12/31/2020   Loculated pleural effusion 12/31/2020   Asthma    Poor dentition    Aortic atherosclerosis (HCC)    Mass of lower lobe of left lung    Marijuana abuse    Tobacco abuse    Dental caries 12/07/2020   Hypokalemia 03/17/2018   Hyperglycemia 03/17/2018   Asthma, moderate persistent 08/17/2016   Vitamin D deficiency 09/01/2015   Family history of diabetes mellitus in brother 08/31/2015   Eczema 08/31/2015   Environmental allergies 04/16/2014     Current Outpatient Medications on File Prior to Visit  Medication Sig Dispense Refill   albuterol (VENTOLIN HFA) 108 (90 Base) MCG/ACT inhaler INHALE 2 PUFFS INTO THE LUNGS EVERY 4 HOURS AS NEEDED FOR WHEEZING OR SHORTNESS OF BREATH 18 g 2   fluticasone furoate-vilanterol (BREO ELLIPTA) 200-25 MCG/ACT AEPB Inhale 1 puff into the lungs daily. 60 each  11   ipratropium-albuterol (DUONEB) 0.5-2.5 (3) MG/3ML SOLN Take 3 mLs by nebulization every 6 (six) hours. 360 mL 1   olopatadine (PATANOL) 0.1 % ophthalmic solution Place 1 drop into both eyes 2 (two) times daily. 5 mL 0   No current facility-administered medications on file prior to visit.    Allergies  Allergen Reactions   Penicillins Rash    Did it involve swelling of the face/tongue/throat, SOB, or low BP?No Did it involve sudden or severe rash/hives, skin peeling, or any reaction on the inside of your mouth or nose?Yes Did you need to seek medical attention at a hospital or doctor's office? no When did it last happen? Child If all above answers are "NO", may proceed with cephalosporin use.    Social History   Socioeconomic History   Marital status: Single    Spouse name: Not on file   Number of children: 3   Years of education: 12   Highest education level: Not on file  Occupational History   Occupation: Cook   Tobacco Use   Smoking status: Never   Smokeless tobacco: Former    Types: Associate Professor Use: Never used  Substance and Sexual Activity   Alcohol use: Not Currently    Comment: beer   Drug use: Not Currently    Types: Marijuana   Sexual activity: Yes  Other Topics Concern   Not on  file  Social History Narrative   Not on file   Social Determinants of Health   Financial Resource Strain: Not on file  Food Insecurity: Not on file  Transportation Needs: Not on file  Physical Activity: Inactive (03/17/2018)   Exercise Vital Sign    Days of Exercise per Week: 0 days    Minutes of Exercise per Session: 0 min  Stress: No Stress Concern Present (03/17/2018)   [employer] of Occupational Health - Occupational Stress Questionnaire    Feeling of Stress : Not at all  Social Connections: Moderately Integrated (03/17/2018)   Social Connection and Isolation Panel [NHANES]    Frequency of Communication with Friends and Family: Twice a week     Frequency of Social Gatherings with Friends and Family: Twice a week    Attends Religious Services: More than 4 times per year    Active Member of [employer] or Organizations: No    Attends Banker Meetings: Never    Marital Status: Married  Catering manager Violence: Not At Risk (03/17/2018)   Humiliation, Afraid, Rape, and Kick questionnaire    Fear of Current or Ex-Partner: No    Emotionally Abused: No    Physically Abused: No    Sexually Abused: No    Family History  Problem Relation Age of Onset   Asthma Brother    Diabetes Brother     Past Surgical History:  Procedure Laterality Date   NO PAST SURGERIES      ROS: Review of Systems Negative except as stated above  PHYSICAL EXAM: BP 125/81 (BP Location: Left Arm, Patient Position: Sitting, Cuff Size: Normal)   Pulse 61   Temp 98.3 F (36.8 C) (Oral)   Ht 5\' 2"  (1.575 m)   Wt 157 lb (71.2 kg)   SpO2 96%   BMI 28.72 kg/m   Physical Exam   General appearance - alert, well appearing, middle age male and in no distress Mental status - normal mood, behavior, speech, dress, motor activity, and thought processes Chest - clear to auscultation, no wheezes, rales or rhonchi, symmetric air entry Heart - normal rate, regular rhythm, normal S1, S2, no murmurs, rubs, clicks or gallops Extremities - peripheral pulses normal, no pedal edema, no clubbing or cyanosis     Latest Ref Rng & Units 04/04/2022    2:39 PM 01/26/2021   11:55 AM 01/07/2021    7:56 AM  CMP  Glucose 70 - 99 mg/dL 03/07/2021  77  099   BUN 6 - 24 mg/dL 15  10  7    Creatinine 0.76 - 1.27 mg/dL 833   8.25   Sodium 134 - 144 mmol/L 142  141  138   Potassium 3.5 - 5.2 mmol/L 5.1  4.6  3.8   Chloride 96 - 106 mmol/L 102  102  103   CO2 20 - 29 mmol/L 23  22  25    Calcium 8.7 - 10.2 mg/dL 9.5  9.7  8.1   Total Protein 6.0 - 8.5 g/dL 7.3  8.3    Total Bilirubin 0.0 - 1.2 mg/dL 0.3  0.4    Alkaline Phos 44 - 121 IU/L 129  128    AST 0 - 40 IU/L 22  23     ALT 0 - 44 IU/L 13  26     Lipid Panel     Component Value Date/Time   CHOL 239 (H) 04/04/2022 1439   TRIG 154 (H) 04/04/2022 1439   HDL 60  04/04/2022 1439   CHOLHDL 4.0 04/04/2022 1439   CHOLHDL 3.8 12/31/2020 0906   VLDL 6 12/31/2020 0906   LDLCALC 151 (H) 04/04/2022 1439    CBC    Component Value Date/Time   WBC 4.7 04/04/2022 1439   WBC 8.2 01/07/2021 0756   RBC 5.21 04/04/2022 1439   RBC 4.41 01/07/2021 0756   HGB 16.7 04/04/2022 1439   HCT 48.2 04/04/2022 1439   PLT 233 04/04/2022 1439   MCV 93 04/04/2022 1439   MCH 32.1 04/04/2022 1439   MCH 30.4 01/07/2021 0756   MCHC 34.6 04/04/2022 1439   MCHC 34.3 01/07/2021 0756   RDW 12.7 04/04/2022 1439   LYMPHSABS 1.0 01/05/2021 0052   LYMPHSABS 2.4 10/19/2020 1012   MONOABS 1.3 (H) 01/05/2021 0052   EOSABS 0.1 01/05/2021 0052   EOSABS 0.5 (H) 10/19/2020 1012   BASOSABS 0.0 01/05/2021 0052   BASOSABS 0.1 10/19/2020 1012    ASSESSMENT AND PLAN:  1. Essential hypertension Close to goal.  Continue Norvasc. - amLODipine (NORVASC) 5 MG tablet; TAKE 1 TABLET(5 MG) BY MOUTH DAILY  Dispense: 90 tablet; Refill: 1 - Comprehensive metabolic panel  2. Moderate persistent asthma without complication Stable on Breo  3. Aortic atherosclerosis ([place]) 4. Mixed hyperlipidemia Discussed on encourage healthy eating habits.  We will check lipid profile today. - Lipid panel    Patient was given the opportunity to ask questions.  Patient verbalized understanding of the plan and was able to repeat key elements of the plan.   This documentation was completed using Radio producer.  Any transcriptional errors are unintentional.  Orders Placed This Encounter  Procedures   Comprehensive metabolic panel   Lipid panel     Requested Prescriptions   Signed Prescriptions Disp Refills   amLODipine (NORVASC) 5 MG tablet 90 tablet 1    Sig: TAKE 1 TABLET(5 MG) BY MOUTH DAILY    Return in about 4 months (around  04/06/2023).  Karle Plumber, MD, FACP

## 2022-12-06 NOTE — Patient Instructions (Signed)
Please remember to get your COVID booster at any outside pharmacy.

## 2022-12-07 LAB — COMPREHENSIVE METABOLIC PANEL
ALT: 15 IU/L (ref 0–44)
AST: 20 IU/L (ref 0–40)
Albumin/Globulin Ratio: 1.7 (ref 1.2–2.2)
Albumin: 4.6 g/dL (ref 4.1–5.1)
Alkaline Phosphatase: 113 IU/L (ref 44–121)
BUN/Creatinine Ratio: 13 (ref 9–20)
BUN: 12 mg/dL (ref 6–24)
Bilirubin Total: 0.2 mg/dL (ref 0.0–1.2)
CO2: 23 mmol/L (ref 20–29)
Calcium: 9.3 mg/dL (ref 8.7–10.2)
Chloride: 105 mmol/L (ref 96–106)
Creatinine, Ser: 0.95 mg/dL (ref 0.76–1.27)
Globulin, Total: 2.7 g/dL (ref 1.5–4.5)
Glucose: 99 mg/dL (ref 70–99)
Potassium: 4.3 mmol/L (ref 3.5–5.2)
Sodium: 141 mmol/L (ref 134–144)
Total Protein: 7.3 g/dL (ref 6.0–8.5)
eGFR: 98 mL/min/{1.73_m2} (ref >59)

## 2022-12-07 LAB — LIPID PANEL
Chol/HDL Ratio: 4.8 ratio (ref 0.0–5.0)
Cholesterol, Total: 253 mg/dL — ABNORMAL HIGH (ref 100–199)
HDL: 53 mg/dL (ref >39)
LDL Chol Calc (NIH): 169 mg/dL — ABNORMAL HIGH (ref 0–99)
Triglycerides: 168 mg/dL — ABNORMAL HIGH (ref 0–149)
VLDL Cholesterol Cal: 31 mg/dL (ref 5–40)

## 2022-12-17 ENCOUNTER — Other Ambulatory Visit: Payer: Self-pay | Admitting: Internal Medicine

## 2022-12-17 DIAGNOSIS — E782 Mixed hyperlipidemia: Secondary | ICD-10-CM

## 2023-01-17 ENCOUNTER — Other Ambulatory Visit: Payer: Self-pay | Admitting: Internal Medicine

## 2023-01-17 DIAGNOSIS — I10 Essential (primary) hypertension: Secondary | ICD-10-CM

## 2023-01-18 ENCOUNTER — Ambulatory Visit: Payer: 59 | Admitting: Dietician

## 2023-04-11 ENCOUNTER — Ambulatory Visit: Payer: 59 | Attending: Internal Medicine | Admitting: Internal Medicine

## 2023-04-11 ENCOUNTER — Encounter: Payer: Self-pay | Admitting: Internal Medicine

## 2023-04-11 VITALS — BP 117/78 | HR 64 | Temp 98.3°F | Ht 62.0 in | Wt 161.0 lb

## 2023-04-11 DIAGNOSIS — I10 Essential (primary) hypertension: Secondary | ICD-10-CM | POA: Diagnosis not present

## 2023-04-11 DIAGNOSIS — L309 Dermatitis, unspecified: Secondary | ICD-10-CM

## 2023-04-11 DIAGNOSIS — E782 Mixed hyperlipidemia: Secondary | ICD-10-CM

## 2023-04-11 DIAGNOSIS — J4540 Moderate persistent asthma, uncomplicated: Secondary | ICD-10-CM | POA: Diagnosis not present

## 2023-04-11 MED ORDER — AMLODIPINE BESYLATE 5 MG PO TABS: 5.0000 mg | ORAL_TABLET | Freq: Every day | ORAL | 1 refills | Status: DC

## 2023-04-11 MED ORDER — TRIAMCINOLONE ACETONIDE 0.025 % EX OINT
TOPICAL_OINTMENT | CUTANEOUS | 1 refills | Status: DC
Start: 2023-04-11 — End: 2024-04-12

## 2023-04-11 MED ORDER — FLUTICASONE PROPIONATE 50 MCG/ACT NA SUSP
NASAL | 6 refills | Status: AC
Start: 2023-04-11 — End: ?

## 2023-04-11 MED ORDER — LORATADINE 10 MG PO TABS
10.0000 mg | ORAL_TABLET | Freq: Every day | ORAL | 1 refills | Status: DC
Start: 2023-04-11 — End: 2023-08-14

## 2023-04-11 NOTE — Patient Instructions (Signed)
Dyslipidemia Dyslipidemia is an imbalance of waxy, fat-like substances (lipids) in the blood. The body needs lipids in small amounts. Dyslipidemia often involves a high level of cholesterol or triglycerides, which are types of lipids. Common forms of dyslipidemia include: High levels of LDL cholesterol. LDL is the type of cholesterol that causes fatty deposits (plaques) to build up in the blood vessels that carry blood away from the heart (arteries). Low levels of HDL cholesterol. HDL cholesterol is the type of cholesterol that protects against heart disease. High levels of HDL remove the LDL buildup from arteries. High levels of triglycerides. Triglycerides are a fatty substance in the blood that is linked to a buildup of plaques in the arteries. What are the causes? There are two main types of dyslipidemia: primary and secondary. Primary dyslipidemia is caused by changes (mutations) in genes that are passed down through families (inherited). These mutations cause several types of dyslipidemia. Secondary dyslipidemia may be caused by various risk factors that can lead to the disease, such as lifestyle choices and certain medical conditions. What increases the risk? You are more likely to develop this condition if you are an older man or if you are a woman who has gone through menopause. Other risk factors include: Having a family history of dyslipidemia. Taking certain medicines, including birth control pills, steroids, some diuretics, and beta-blockers. Eating a diet high in saturated fat. Smoking cigarettes or excessive alcohol intake. Having certain medical conditions such as diabetes, polycystic ovary syndrome (PCOS), kidney disease, liver disease, or hypothyroidism. Not exercising regularly. Being overweight or obese with too much belly fat. What are the signs or symptoms? In most cases, dyslipidemia does not usually cause any symptoms. In severe cases, very high lipid levels can  cause: Fatty bumps under the skin (xanthomas). A white or gray ring around the black center (pupil) of the eye. Very high triglyceride levels can cause inflammation of the pancreas (pancreatitis). How is this diagnosed? Your health care provider may diagnose dyslipidemia based on a routine blood test (fasting blood test). Because most people do not have symptoms of the condition, this blood testing (lipid profile) is done on adults age 20 and older and is repeated every 4-6 years. This test checks: Total cholesterol. This measures the total amount of cholesterol in your blood, including LDL cholesterol, HDL cholesterol, and triglycerides. A healthy number is below 200 mg/dL (5.17 mmol/L). LDL cholesterol. The target number for LDL cholesterol is different for each person, depending on individual risk factors. A healthy number is usually below 100 mg/dL (2.59 mmol/L). Ask your health care provider what your LDL cholesterol should be. HDL cholesterol. An HDL level of 60 mg/dL (1.55 mmol/L) or higher is best because it helps to protect against heart disease. A number below 40 mg/dL (1.03 mmol/L) for men or below 50 mg/dL (1.29 mmol/L) for women increases the risk for heart disease. Triglycerides. A healthy triglyceride number is below 150 mg/dL (1.69 mmol/L). If your lipid profile is abnormal, your health care provider may do other blood tests. How is this treated? Treatment depends on the type of dyslipidemia that you have and your other risk factors for heart disease and stroke. Your health care provider will have a target range for your lipid levels based on this information. Treatment for dyslipidemia starts with lifestyle changes, such as diet and exercise. Your health care provider may recommend that you: Get regular exercise. Make changes to your diet. Quit smoking if you smoke. Limit your alcohol intake. If diet   changes and exercise do not help you reach your goals, your health care provider  may also prescribe medicine to lower lipids. The most commonly prescribed type of medicine lowers your LDL cholesterol (statin drug). If you have a high triglyceride level, your provider may prescribe another type of drug (fibrate) or an omega-3 fish oil supplement, or both. Follow these instructions at home: Eating and drinking  Follow instructions from your health care provider or dietitian about eating or drinking restrictions. Eat a healthy diet as told by your health care provider. This can help you reach and maintain a healthy weight, lower your LDL cholesterol, and raise your HDL cholesterol. This may include: Limiting your calories, if you are overweight. Eating more fruits, vegetables, whole grains, fish, and lean meats. Limiting saturated fat, trans fat, and cholesterol. Do not drink alcohol if: Your health care provider tells you not to drink. You are pregnant, may be pregnant, or are planning to become pregnant. If you drink alcohol: Limit how much you have to: 0-1 drink a day for women. 0-2 drinks a day for men. Know how much alcohol is in your drink. In the [place] one drink equals one 12 oz bottle of beer (355 mL), one 5 oz glass of wine (148 mL), or one 1 oz glass of hard liquor (44 mL). Activity Get regular exercise. Start an exercise and strength training program as told by your health care provider. Ask your health care provider what activities are safe for you. Your health care provider may recommend: 30 minutes of aerobic activity 4-6 days a week. Brisk walking is an example of aerobic activity. Strength training 2 days a week. General instructions Do not use any products that contain nicotine or tobacco. These products include cigarettes, chewing tobacco, and vaping devices, such as e-cigarettes. If you need help quitting, ask your health care provider. Take over-the-counter and prescription medicines only as told by your health care provider. This includes  supplements. Keep all follow-up visits. This is important. Contact a health care provider if: You are having trouble sticking to your exercise or diet plan. You are struggling to quit smoking or to control your use of alcohol. Summary Dyslipidemia often involves a high level of cholesterol or triglycerides, which are types of lipids. Treatment depends on the type of dyslipidemia that you have and your other risk factors for heart disease and stroke. Treatment for dyslipidemia starts with lifestyle changes, such as diet and exercise. Your health care provider may prescribe medicine to lower lipids. This information is not intended to replace advice given to you by your health care provider. Make sure you discuss any questions you have with your health care provider. Document Revised: 06/17/2022 Document Reviewed: 01/18/2021 Elsevier Patient Education  2023 [employer]  

## 2023-04-11 NOTE — Progress Notes (Signed)
Patient ID: Joseph Fitzgerald, male    DOB: 1971/09/23  MRN: [id number]  CC: Hypertension (HTN f/u. /No questions / concerns)   Subjective: [name] is a 52 y.o. male who presents for chronic ds management.  His wife, Joseph Fitzgerald, is with him. His concerns today include:  Pt with HTN, HL, mod persistent asthma, obesity, environmental allergies and eczema, loculated pleural effusion left side requiring fibrinolytic therapy through chest tube 12/2020, aortic atherosclerosis on chest CT 11/2020   HTN:  taking and tolerating Norvasc 5 mg Limits salt No device to check BP No CP/SOB/LE edema/dizziness  Asthma:  No recent flare.  Using [place] daily as prescribed.  He has not used his neb in a while.  Last used Albuterol inhaler earlier this mth. Endorses sneezing and itchy eyes.  No itchy throat.  + nasal congestion.  Also reports intermittent itchy rash on arms and legs.  Current episode/flare of rash x 4 days  HL:  referred to nutritionist for dietary counseling for hyperlipidemia.  Patient states that he was called but he missed the appointment because he had to go to a funeral in [place].  He would like to reschedule.  Patient Active Problem List   Diagnosis Date Noted   Necrotizing pneumonia (HCC) 09/02/2021   Chest tube in place    COVID-19 virus infection 12/31/2020   Loculated pleural effusion 12/31/2020   Asthma    Poor dentition    Aortic atherosclerosis (HCC)    Mass of lower lobe of left lung    Marijuana abuse    Tobacco abuse    Dental caries 12/07/2020   Hypokalemia 03/17/2018   Hyperglycemia 03/17/2018   Asthma, moderate persistent 08/17/2016   Vitamin D deficiency 09/01/2015   Family history of diabetes mellitus in brother 08/31/2015   Eczema 08/31/2015   Environmental allergies 04/16/2014     Current Outpatient Medications on File Prior to Visit  Medication Sig Dispense Refill   albuterol (VENTOLIN HFA) 108 (90 Base) MCG/ACT inhaler INHALE 2 PUFFS INTO THE  LUNGS EVERY 4 HOURS AS NEEDED FOR WHEEZING OR SHORTNESS OF BREATH 18 g 2   amLODipine (NORVASC) 5 MG tablet TAKE 1 TABLET(5 MG) BY MOUTH DAILY 90 tablet 0   fluticasone furoate-vilanterol (BREO ELLIPTA) 200-25 MCG/ACT AEPB Inhale 1 puff into the lungs daily. 60 each 11   ipratropium-albuterol (DUONEB) 0.5-2.5 (3) MG/3ML SOLN Take 3 mLs by nebulization every 6 (six) hours. 360 mL 1   olopatadine (PATANOL) 0.1 % ophthalmic solution Place 1 drop into both eyes 2 (two) times daily. 5 mL 0   No current facility-administered medications on file prior to visit.    Allergies  Allergen Reactions   Penicillins Rash    Did it involve swelling of the face/tongue/throat, SOB, or low BP?No Did it involve sudden or severe rash/hives, skin peeling, or any reaction on the inside of your mouth or nose?Yes Did you need to seek medical attention at a hospital or doctor's office? no When did it last happen? Child If all above answers are "NO", may proceed with cephalosporin use.    Social History   Socioeconomic History   Marital status: Single    Spouse name: Not on file   Number of children: 3   Years of education: 12   Highest education level: Not on file  Occupational History   Occupation: Cook   Tobacco Use   Smoking status: Never   Smokeless tobacco: Former    Types: Engineer, drilling  Vaping Use: Never used  Substance and Sexual Activity   Alcohol use: Not Currently    Comment: beer   Drug use: Not Currently    Types: Marijuana   Sexual activity: Yes  Other Topics Concern   Not on file  Social History Narrative   Not on file   Social Determinants of Health   Financial Resource Strain: Not on file  Food Insecurity: Not on file  Transportation Needs: Not on file  Physical Activity: Inactive (03/17/2018)   Exercise Vital Sign    Days of Exercise per Week: 0 days    Minutes of Exercise per Session: 0 min  Stress: No Stress Concern Present (03/17/2018)   [employer] of  Occupational Health - Occupational Stress Questionnaire    Feeling of Stress : Not at all  Social Connections: Moderately Integrated (03/17/2018)   Social Connection and Isolation Panel [NHANES]    Frequency of Communication with Friends and Family: Twice a week    Frequency of Social Gatherings with Friends and Family: Twice a week    Attends Religious Services: More than 4 times per year    Active Member of [employer] or Organizations: No    Attends Banker Meetings: Never    Marital Status: Married  Catering manager Violence: Not At Risk (03/17/2018)   Humiliation, Afraid, Rape, and Kick questionnaire    Fear of Current or Ex-Partner: No    Emotionally Abused: No    Physically Abused: No    Sexually Abused: No    Family History  Problem Relation Age of Onset   Asthma Brother    Diabetes Brother     Past Surgical History:  Procedure Laterality Date   NO PAST SURGERIES      ROS: Review of Systems Negative except as stated above  PHYSICAL EXAM: BP 117/78 (BP Location: Left Arm, Patient Position: Sitting, Cuff Size: Normal)   Pulse 64   Temp 98.3 F (36.8 C) (Oral)   Ht 5\' 2"  (1.575 m)   Wt 161 lb (73 kg)   SpO2 96%   BMI 29.45 kg/m   Physical Exam   General appearance - alert, well appearing, and in no distress Mental status - normal mood, behavior, speech, dress, motor activity, and thought processes Chest - clear to auscultation, no wheezes, rales or rhonchi, symmetric air entry Heart - normal rate, regular rhythm, normal S1, S2, no murmurs, rubs, clicks or gallops Extremities - peripheral pulses normal, no pedal edema, no clubbing or cyanosis Skin -eczematoid type rash on both upper extremities, dorsal surface of the hands and popliteal areas of the legs.  These areas appear thick with excoriation     Latest Ref Rng & Units 12/06/2022    3:27 PM 04/04/2022    2:39 PM 01/26/2021   11:55 AM  CMP  Glucose 70 - 99 mg/dL 99  621  77   BUN 6 - 24 mg/dL 12  15   10    Creatinine 0.76 - 1.27 mg/dL 3.08  6.57  8.46   Sodium 134 - 144 mmol/L 141  142  141   Potassium 3.5 - 5.2 mmol/L 4.3  5.1  4.6   Chloride 96 - 106 mmol/L 105  102  102   CO2 20 - 29 mmol/L 23  23  22    Calcium 8.7 - 10.2 mg/dL 9.3  9.5  9.7   Total Protein 6.0 - 8.5 g/dL 7.3  7.3  8.3   Total Bilirubin 0.0 - 1.2 mg/dL  0.2  0.3  0.4   Alkaline Phos 44 - 121 IU/L 113  129  128   AST 0 - 40 IU/L 20  22  23    ALT 0 - 44 IU/L 15  13  26     Lipid Panel     Component Value Date/Time   CHOL 253 (H) 12/06/2022 1527   TRIG 168 (H) 12/06/2022 1527   HDL 53 12/06/2022 1527   CHOLHDL 4.8 12/06/2022 1527   CHOLHDL 3.8 12/31/2020 0906   VLDL 6 12/31/2020 0906   LDLCALC 169 (H) 12/06/2022 1527    CBC    Component Value Date/Time   WBC 4.7 04/04/2022 1439   WBC 8.2 01/07/2021 0756   RBC 5.21 04/04/2022 1439   RBC 4.41 01/07/2021 0756   HGB 16.7 04/04/2022 1439   HCT 48.2 04/04/2022 1439   PLT 233 04/04/2022 1439   MCV 93 04/04/2022 1439   MCH 32.1 04/04/2022 1439   MCH 30.4 01/07/2021 0756   MCHC 34.6 04/04/2022 1439   MCHC 34.3 01/07/2021 0756   RDW 12.7 04/04/2022 1439   LYMPHSABS 1.0 01/05/2021 0052   LYMPHSABS 2.4 10/19/2020 1012   MONOABS 1.3 (H) 01/05/2021 0052   EOSABS 0.1 01/05/2021 0052   EOSABS 0.5 (H) 10/19/2020 1012   BASOSABS 0.0 01/05/2021 0052   BASOSABS 0.1 10/19/2020 1012    ASSESSMENT AND PLAN:  1. Essential hypertension At goal.  Continue amlodipine 5 mg daily. - amLODipine (NORVASC) 5 MG tablet; Take 1 tablet (5 mg total) by mouth daily.  Dispense: 90 tablet; Refill: 1  2. Moderate persistent asthma with allergic rhinitis without complication Continue Breo.  He has an allergy component to his asthma.  Recommend that we add Claritin and Flonase nasal spray. - loratadine (CLARITIN) 10 MG tablet; Take 1 tablet (10 mg total) by mouth daily.  Dispense: 100 tablet; Refill: 1 - fluticasone (FLONASE) 50 MCG/ACT nasal spray; 1 spray each nostril daily x 1  week then PRN for allergy symptoms  Dispense: 16 g; Refill: 6  3. Eczema, unspecified type Not uncommon and allergic asthma. Discussed the importance of using creams/lotions that help maintain some moisture in the skin.  Recommend purchasing Aveeno bath packs over-the-counter and using it in his bath water several days a week. - triamcinolone (KENALOG) 0.025 % ointment; Apply to affected area BID x 1-2 weeks then PRN.  Do not use on face.  Dispense: 30 g; Refill: 1 - Ambulatory referral to Dermatology  4. Mixed hyperlipidemia - Amb ref to Medical Nutrition Therapy-MNT    Patient was given the opportunity to ask questions.  Patient verbalized understanding of the plan and was able to repeat key elements of the plan.   This documentation was completed using Paediatric nurse.  Any transcriptional errors are unintentional.  No orders of the defined types were placed in this encounter.    Requested Prescriptions    No prescriptions requested or ordered in this encounter    No follow-ups on file.  Jonah Blue, MD, FACP

## 2023-04-25 ENCOUNTER — Other Ambulatory Visit: Payer: Self-pay | Admitting: Internal Medicine

## 2023-04-25 DIAGNOSIS — J4540 Moderate persistent asthma, uncomplicated: Secondary | ICD-10-CM

## 2023-04-25 NOTE — Telephone Encounter (Signed)
Duplicate request, already requested by pharmacy on 04/25/23 in refill encounter.

## 2023-04-25 NOTE — Telephone Encounter (Signed)
Medication Refill - Medication: fluticasone furoate-vilanterol (BREO ELLIPTA) 200-25 MCG/ACT AEPB [[id number]]   Has the patient contacted their pharmacy? Yes.     (Agent: If yes, when and what did the pharmacy advise?) Call PCP   Preferred Pharmacy (with phone number or street name): [id number] - [place],  - [address] AT [facility] [address]   Has the patient been seen for an appointment in the last year OR does the patient have an upcoming appointment? Yes.    Agent: Please be advised that RX refills may take up to 3 business days. We ask that you follow-up with your pharmacy.

## 2023-08-14 ENCOUNTER — Encounter: Payer: Self-pay | Admitting: Internal Medicine

## 2023-08-14 ENCOUNTER — Ambulatory Visit: Payer: 59 | Attending: Internal Medicine | Admitting: Internal Medicine

## 2023-08-14 VITALS — BP 126/77 | HR 61 | Temp 98.2°F | Ht 62.0 in | Wt 161.0 lb

## 2023-08-14 DIAGNOSIS — J4540 Moderate persistent asthma, uncomplicated: Secondary | ICD-10-CM | POA: Diagnosis not present

## 2023-08-14 DIAGNOSIS — I10 Essential (primary) hypertension: Secondary | ICD-10-CM

## 2023-08-14 DIAGNOSIS — E782 Mixed hyperlipidemia: Secondary | ICD-10-CM | POA: Diagnosis not present

## 2023-08-14 DIAGNOSIS — Z23 Encounter for immunization: Secondary | ICD-10-CM

## 2023-08-14 DIAGNOSIS — L309 Dermatitis, unspecified: Secondary | ICD-10-CM

## 2023-08-14 MED ORDER — LORATADINE 10 MG PO TABS
10.0000 mg | ORAL_TABLET | Freq: Every day | ORAL | 1 refills | Status: AC
Start: 2023-08-14 — End: ?

## 2023-08-14 MED ORDER — FLUTICASONE FUROATE-VILANTEROL 200-25 MCG/ACT IN AEPB
1.0000 | INHALATION_SPRAY | Freq: Every day | RESPIRATORY_TRACT | 6 refills | Status: DC
Start: 2023-08-14 — End: 2023-12-14

## 2023-08-14 NOTE — Patient Instructions (Addendum)
Please call [facility] Ph.# [phone] [facility] [place] [address]) to schedule appointment with the nutritionist for nutrition counseling regarding high cholesterol.  Stop in our pharmacy down stairs on the 1st floor to get your COVID-19 booster shot.   Marland Kitchen

## 2023-08-14 NOTE — Progress Notes (Signed)
Patient ID: Joseph Fitzgerald, male    DOB: 06/03/1971  MRN: [id number]  CC: Hypertension (HTN f/u. Med refill./No questions / concerns/Flu vax administered on 08/14/23 - C.A)   Subjective: Joseph Fitzgerald is a 52 y.o. male who presents for chronic ds management. His concerns today include:  Pt with HTN, HL, mod persistent asthma, obesity, environmental allergies and eczema, loculated pleural effusion left side requiring fibrinolytic therapy through chest tube 12/2020, aortic atherosclerosis on chest CT 11/2020   HTN:   taking and tolerating Norvasc 5 mg Limits salt No device to check BP No CP/SOB/LE edema/dizziness.  HA about 2x/mth.  Takes Aleve with good results Walks 3x/wk in the mornings.  Asthma/Allergies/Eczema:  No flares since last visit 4 mths ago. Using [place] daily as prescribed.  Has not used neb machine in over a yr.  Carries Albuterol inhaler but has not had to use.   Not taking Loratadine.  Rxn sent to his pharmacy on last visit.  Always has itchy eyes and sneezing.  No itchy throat.  Uses Triamcinolone cream about 2x/wk  HL:  referred to nutritionist for dietary counseling for hyperlipidemia on last visit.  They called him and left a message.  Patient states he has been checking his messages and does not recall getting a call from them.  HM:  agrees to flu shot and COVID booster. Patient Active Problem List   Diagnosis Date Noted   Necrotizing pneumonia (HCC) 09/02/2021   Chest tube in place    COVID-19 virus infection 12/31/2020   Loculated pleural effusion 12/31/2020   Asthma    Poor dentition    Aortic atherosclerosis (HCC)    Mass of lower lobe of left lung    Marijuana abuse    Tobacco abuse    Dental caries 12/07/2020   Hypokalemia 03/17/2018   Hyperglycemia 03/17/2018   Asthma, moderate persistent 08/17/2016   Vitamin D deficiency 09/01/2015   Family history of diabetes mellitus in brother 08/31/2015   Eczema 08/31/2015   Environmental allergies 04/16/2014      Current Outpatient Medications on File Prior to Visit  Medication Sig Dispense Refill   albuterol (VENTOLIN HFA) 108 (90 Base) MCG/ACT inhaler INHALE 2 PUFFS INTO THE LUNGS EVERY 4 HOURS AS NEEDED FOR WHEEZING OR SHORTNESS OF BREATH 18 g 2   amLODipine (NORVASC) 5 MG tablet Take 1 tablet (5 mg total) by mouth daily. 90 tablet 1   fluticasone (FLONASE) 50 MCG/ACT nasal spray 1 spray each nostril daily x 1 week then PRN for allergy symptoms 16 g 6   fluticasone furoate-vilanterol (BREO ELLIPTA) 200-25 MCG/ACT AEPB INHALE 1 PUFF INTO THE LUNGS DAILY 60 each 2   ipratropium-albuterol (DUONEB) 0.5-2.5 (3) MG/3ML SOLN Take 3 mLs by nebulization every 6 (six) hours. 360 mL 1   loratadine (CLARITIN) 10 MG tablet Take 1 tablet (10 mg total) by mouth daily. 100 tablet 1   olopatadine (PATANOL) 0.1 % ophthalmic solution Place 1 drop into both eyes 2 (two) times daily. 5 mL 0   triamcinolone (KENALOG) 0.025 % ointment Apply to affected area BID x 1-2 weeks then PRN.  Do not use on face. 30 g 1   No current facility-administered medications on file prior to visit.    Allergies  Allergen Reactions   Penicillins Rash    Did it involve swelling of the face/tongue/throat, SOB, or low BP?No Did it involve sudden or severe rash/hives, skin peeling, or any reaction on the inside of your mouth or nose?Yes  Did you need to seek medical attention at a hospital or doctor's office? no When did it last happen? Child If all above answers are "NO", may proceed with cephalosporin use.    Social History   Socioeconomic History   Marital status: Single    Spouse name: Not on file   Number of children: 3   Years of education: 12   Highest education level: Not on file  Occupational History   Occupation: Cook   Tobacco Use   Smoking status: Never   Smokeless tobacco: Former    Types: Associate Professor status: Never Used  Substance and Sexual Activity   Alcohol use: Not Currently    Comment: beer    Drug use: Not Currently    Types: Marijuana   Sexual activity: Yes  Other Topics Concern   Not on file  Social History Narrative   Not on file   Social Determinants of Health   Financial Resource Strain: Not on file  Food Insecurity: Not on file  Transportation Needs: Not on file  Physical Activity: Inactive (03/17/2018)   Exercise Vital Sign    Days of Exercise per Week: 0 days    Minutes of Exercise per Session: 0 min  Stress: No Stress Concern Present (03/17/2018)   [employer] of Occupational Health - Occupational Stress Questionnaire    Feeling of Stress : Not at all  Social Connections: Moderately Integrated (03/17/2018)   Social Connection and Isolation Panel [NHANES]    Frequency of Communication with Friends and Family: Twice a week    Frequency of Social Gatherings with Friends and Family: Twice a week    Attends Religious Services: More than 4 times per year    Active Member of [employer] or Organizations: No    Attends Banker Meetings: Never    Marital Status: Married  Catering manager Violence: Not At Risk (03/17/2018)   Humiliation, Afraid, Rape, and Kick questionnaire    Fear of Current or Ex-Partner: No    Emotionally Abused: No    Physically Abused: No    Sexually Abused: No    Family History  Problem Relation Age of Onset   Asthma Brother    Diabetes Brother     Past Surgical History:  Procedure Laterality Date   NO PAST SURGERIES      ROS: Review of Systems Negative except as stated above  PHYSICAL EXAM: BP 126/77 (BP Location: Left Arm, Patient Position: Sitting, Cuff Size: Normal)   Pulse 61   Temp 98.2 F (36.8 C) (Oral)   Ht 5\' 2"  (1.575 m)   Wt 161 lb (73 kg)   SpO2 98%   BMI 29.45 kg/m   Wt Readings from Last 3 Encounters:  08/14/23 161 lb (73 kg)  04/11/23 161 lb (73 kg)  12/06/22 157 lb (71.2 kg)    Physical Exam   General appearance - alert, well appearing, middle-age male and in no distress Mental status  - normal mood, behavior, speech, dress, motor activity, and thought processes Neck - supple, no significant adenopathy Chest - clear to auscultation, no wheezes, rales or rhonchi, symmetric air entry Heart - normal rate, regular rhythm, normal S1, S2, no murmurs, rubs, clicks or gallops Extremities - peripheral pulses normal, no pedal edema, no clubbing or cyanosis Skin -some ectopic dermatitis skin changes around the neck     Latest Ref Rng & Units 12/06/2022    3:27 PM 04/04/2022    2:39 PM 01/26/2021  11:55 AM  CMP  Glucose 70 - 99 mg/dL 99  161  77   BUN 6 - 24 mg/dL 12  15  10    Creatinine 0.76 - 1.27 mg/dL 0.96  0.45  4.09   Sodium 134 - 144 mmol/L 141  142  141   Potassium 3.5 - 5.2 mmol/L 4.3  5.1  4.6   Chloride 96 - 106 mmol/L 105  102  102   CO2 20 - 29 mmol/L 23  23  22    Calcium 8.7 - 10.2 mg/dL 9.3  9.5  9.7   Total Protein 6.0 - 8.5 g/dL 7.3  7.3  8.3   Total Bilirubin 0.0 - 1.2 mg/dL 0.2  0.3  0.4   Alkaline Phos 44 - 121 IU/L 113  129  128   AST 0 - 40 IU/L 20  22  23    ALT 0 - 44 IU/L 15  13  26     Lipid Panel     Component Value Date/Time   CHOL 253 (H) 12/06/2022 1527   TRIG 168 (H) 12/06/2022 1527   HDL 53 12/06/2022 1527   CHOLHDL 4.8 12/06/2022 1527   CHOLHDL 3.8 12/31/2020 0906   VLDL 6 12/31/2020 0906   LDLCALC 169 (H) 12/06/2022 1527    CBC    Component Value Date/Time   WBC 4.7 04/04/2022 1439   WBC 8.2 01/07/2021 0756   RBC 5.21 04/04/2022 1439   RBC 4.41 01/07/2021 0756   HGB 16.7 04/04/2022 1439   HCT 48.2 04/04/2022 1439   PLT 233 04/04/2022 1439   MCV 93 04/04/2022 1439   MCH 32.1 04/04/2022 1439   MCH 30.4 01/07/2021 0756   MCHC 34.6 04/04/2022 1439   MCHC 34.3 01/07/2021 0756   RDW 12.7 04/04/2022 1439   LYMPHSABS 1.0 01/05/2021 0052   LYMPHSABS 2.4 10/19/2020 1012   MONOABS 1.3 (H) 01/05/2021 0052   EOSABS 0.1 01/05/2021 0052   EOSABS 0.5 (H) 10/19/2020 1012   BASOSABS 0.0 01/05/2021 0052   BASOSABS 0.1 10/19/2020 1012     ASSESSMENT AND PLAN:  1. Moderate persistent asthma with allergic rhinitis without complication Controlled on Breo. Encouraged him to get prescription filled for the Claritin to help in controlling his allergy symptoms. - fluticasone furoate-vilanterol (BREO ELLIPTA) 200-25 MCG/ACT AEPB; Inhale 1 puff into the lungs daily.  Dispense: 60 each; Refill: 6 - loratadine (CLARITIN) 10 MG tablet; Take 1 tablet (10 mg total) by mouth daily.  Dispense: 100 tablet; Refill: 1  2. Essential hypertension At goal.  Continue Norvasc 5 mg daily.  3. Eczema, unspecified type Continue triamcinolone cream as needed  4. Mixed hyperlipidemia I have given him the phone number to call [facility] to set up appt. encouraged him to continue regular exercise.  Encouraged healthy eating habits.  5. Encounter for immunization - Flu vaccine trivalent PF, 6mos and older(Flulaval,Afluria,Fluarix,Fluzone)  6. Need for COVID-19 vaccine Encouraged him to get COVID-19 booster shot.  Advised that he can get it from any outside pharmacy including pharmacy on the first floor.   Patient was given the opportunity to ask questions.  Patient verbalized understanding of the plan and was able to repeat key elements of the plan.   This documentation was completed using Paediatric nurse.  Any transcriptional errors are unintentional.  Orders Placed This Encounter  Procedures   Flu vaccine trivalent PF, 6mos and older(Flulaval,Afluria,Fluarix,Fluzone)     Requested Prescriptions   Pending Prescriptions Disp Refills   fluticasone furoate-vilanterol (  BREO ELLIPTA) 200-25 MCG/ACT AEPB 60 each 2    Sig: Inhale 1 puff into the lungs daily.    No follow-ups on file.  Jonah Blue, MD, FACP

## 2023-09-12 ENCOUNTER — Encounter: Payer: Self-pay | Admitting: Emergency Medicine

## 2023-09-12 ENCOUNTER — Ambulatory Visit
Admission: EM | Admit: 2023-09-12 | Discharge: 2023-09-12 | Disposition: A | Discharge status: Home / Self Care | Payer: 59 | Attending: Internal Medicine | Admitting: Internal Medicine

## 2023-09-12 ENCOUNTER — Other Ambulatory Visit: Payer: Self-pay

## 2023-09-12 DIAGNOSIS — R21 Rash and other nonspecific skin eruption: Secondary | ICD-10-CM | POA: Diagnosis not present

## 2023-09-12 MED ORDER — METHYLPREDNISOLONE ACETATE 80 MG/ML IJ SUSP
80.0000 mg | Freq: Once | INTRAMUSCULAR | Status: AC
  Administered 2023-09-12: 80 mg via INTRAMUSCULAR

## 2023-09-12 MED ORDER — PREDNISONE 20 MG PO TABS
40.0000 mg | ORAL_TABLET | Freq: Every day | ORAL | 0 refills | Status: AC
Start: 2023-09-12 — End: 2023-09-17

## 2023-09-12 NOTE — ED Triage Notes (Signed)
Pt here for rash and allergic reaction x 6 days with redness to face and body; pt sts hx of same that resolved

## 2023-09-12 NOTE — Discharge Instructions (Signed)
I suspect that you have a severe eczema flareup.  You were given a steroid shot today and I prescribed you prednisone pills.  Start taking prednisone pills tomorrow.  I have placed an urgent referral to dermatology so they should contact you for an appointment.  If they do not contact you in the next few days, please call them yourself at provided phone number.  Go to the emergency department if symptoms persist or worsen.  Blood work is pending.  Will call if anything is abnormal.

## 2023-09-12 NOTE — ED Provider Notes (Signed)
Joseph Fitzgerald    CSN: [id number] Arrival date & time: 09/12/23  1157      History   Chief Complaint Chief Complaint  Patient presents with   Rash    HPI Joseph Fitzgerald is a 52 y.o. male.   Patient presents with itchy rash to face and torso that started about 6 days ago.  Reports it started after he used Aveeno lotion that is new.  He also reports that he has eczema and this feels very similar.  Denies feelings of throat closing or shortness of breath.  Denies any fever or known sick contacts. Patient states that this has occurred previously but resolved on its own.    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   Chest tube in place    COVID-19 virus infection 12/31/2020   Loculated pleural effusion 12/31/2020   Asthma    Poor dentition    Aortic atherosclerosis (HCC)    Mass of lower lobe of left lung    Marijuana abuse    Tobacco abuse    Dental caries 12/07/2020   Hypokalemia 03/17/2018   Hyperglycemia 03/17/2018   Asthma, moderate persistent 08/17/2016   Vitamin D deficiency 09/01/2015   Family history of diabetes mellitus in brother 08/31/2015   Eczema 08/31/2015   Environmental allergies 04/16/2014    Past Surgical History:  Procedure Laterality Date   NO PAST SURGERIES         Home Medications    Prior to Admission medications   Medication Sig Start Date End Date Taking? Authorizing Provider  predniSONE (DELTASONE) 20 MG tablet Take 2 tablets (40 mg total) by mouth daily for 5 days. 09/12/23 09/17/23 Yes Avory Mimbs, Acie Fredrickson, FNP  albuterol (VENTOLIN HFA) 108 (90 Base) MCG/ACT inhaler INHALE 2 PUFFS INTO THE LUNGS EVERY 4 HOURS AS NEEDED FOR WHEEZING OR SHORTNESS OF BREATH 08/30/22   Marcine Matar, MD  amLODipine (NORVASC) 5 MG tablet Take 1 tablet (5 mg total) by mouth daily. 04/11/23   Marcine Matar, MD  fluticasone Aleda Grana) 50 MCG/ACT nasal spray 1 spray each nostril daily x 1 week then PRN for allergy symptoms 04/11/23   Marcine Matar, MD  fluticasone furoate-vilanterol (BREO ELLIPTA) 200-25 MCG/ACT AEPB Inhale 1 puff into the lungs daily. 08/14/23   Marcine Matar, MD  ipratropium-albuterol (DUONEB) 0.5-2.5 (3) MG/3ML SOLN Take 3 mLs by nebulization every 6 (six) hours. 09/10/19   Black, Lesle Chris, NP  loratadine (CLARITIN) 10 MG tablet Take 1 tablet (10 mg total) by mouth daily. 08/14/23   Marcine Matar, MD  olopatadine (PATANOL) 0.1 % ophthalmic solution Place 1 drop into both eyes 2 (two) times daily. 01/26/21   Marcine Matar, MD  triamcinolone (KENALOG) 0.025 % ointment Apply to affected area BID x 1-2 weeks then PRN.  Do not use on face. 04/11/23   Marcine Matar, MD    Family History Family History  Problem Relation Age of Onset   Asthma Brother    Diabetes Brother     Social History Social History   Tobacco Use   Smoking status: Never   Smokeless tobacco: Former  Types: Chew  Vaping Use   Vaping status: Never Used  Substance Use Topics   Alcohol use: Not Currently    Comment: beer   Drug use: Not Currently    Types: Marijuana     Allergies   Penicillins   Review of Systems Review of Systems Per HPI  Physical Exam Triage Vital Signs ED Triage Vitals  Encounter Vitals Group     BP 09/12/23 1411 118/84     Systolic BP Percentile --      Diastolic BP Percentile --      Pulse Rate 09/12/23 1411 78     Resp 09/12/23 1411 20     Temp 09/12/23 1411 97.7 F (36.5 C)     Temp Source 09/12/23 1411 Oral     SpO2 09/12/23 1411 98 %     Weight --      Height --      Head Circumference --      Peak Flow --      Pain Score 09/12/23 1421 0     Pain Loc --      Pain Education --      Exclude from Growth Chart --    No data  found.  Updated Vital Signs BP 118/84 (BP Location: Left Arm)   Pulse 78   Temp 97.7 F (36.5 C) (Oral)   Resp 20   SpO2 98%   Visual Acuity Right Eye Distance:   Left Eye Distance:   Bilateral Distance:    Right Eye Near:   Left Eye Near:    Bilateral Near:     Physical Exam Constitutional:      General: He is not in acute distress.    Appearance: Normal appearance. He is not toxic-appearing or diaphoretic.  HENT:     Head: Normocephalic and atraumatic.  Eyes:     Extraocular Movements: Extraocular movements intact.     Conjunctiva/sclera: Conjunctivae normal.  Pulmonary:     Effort: Pulmonary effort is normal.  Skin:    Comments: Patient has diffuse erythema throughout entirety of face and extends into the upper chest and upper back.  There is also rash noted to bilateral upper extremities and extends into the antecubital spaces.  Areas of the rash are patchy and scaly at various areas.  Mild swelling noted to upper and lower eyelids bilaterally.  Neurological:     General: No focal deficit present.     Mental Status: He is alert and oriented to person, place, and time. Mental status is at baseline.  Psychiatric:        Mood and Affect: Mood normal.        Behavior: Behavior normal.        Thought Content: Thought content normal.        Judgment: Judgment normal.      UC Treatments / Results  Labs (all labs ordered are listed, but only abnormal results are displayed) Labs Reviewed  CBC  BASIC METABOLIC PANEL    EKG   Radiology No results found.  Procedures Procedures (including critical Fitzgerald time)  Medications Ordered in UC Medications  methylPREDNISolone acetate (DEPO-MEDROL) injection 80 mg (80 mg Intramuscular Given 09/12/23 1438)    Initial Impression / Assessment and Plan / UC Course  I have reviewed the triage vital signs and the nursing notes.  Pertinent labs & imaging results that were available during my Fitzgerald of the patient were reviewed  by me and considered in my medical decision making (see  chart for details).     Differential diagnoses include allergic contact dermatitis versus atopic dermatitis flareup.  No signs of anaphylaxis on exam.  Given how diffuse and severe rash is, will opt to treat with steroid therapy today.  Patient does have a history of necrotizing pneumonia which is healed per last pulmonology note so steroid should be safe in a short course.  IM Depo-Medrol administered in urgent Fitzgerald and prednisone pills prescribed for patient to start taking tomorrow.  Urgent ambulatory referral to dermatology was placed as well given how significant rash is.  Advised patient that if did not call in the next few days, he is to call them himself at provided phone number.  Rash is most consistent with allergic etiology but is given appearance on physical exam, will obtain BMP and CBC to rule out other worrisome or infectious etiologies of rash.  Advised strict follow-up precautions.  Patient verbalized understanding and was agreeable with plan. Final Clinical Impressions(s) / UC Diagnoses   Final diagnoses:  Rash and nonspecific skin eruption     Discharge Instructions      I suspect that you have a severe eczema flareup.  You were given a steroid shot today and I prescribed you prednisone pills.  Start taking prednisone pills tomorrow.  I have placed an urgent referral to dermatology so they should contact you for an appointment.  If they do not contact you in the next few days, please call them yourself at provided phone number.  Go to the emergency department if symptoms persist or worsen.  Blood work is pending.  Will call if anything is abnormal.    ED Prescriptions     Medication Sig Dispense Auth. Provider   predniSONE (DELTASONE) 20 MG tablet Take 2 tablets (40 mg total) by mouth daily for 5 days. 10 tablet Gustavus Bryant, [place]      PDMP not reviewed this encounter.   Gustavus Bryant, [place] 09/12/23 1455

## 2023-09-16 LAB — BASIC METABOLIC PANEL
BUN/Creatinine Ratio: 15 (ref 9–20)
BUN: 12 mg/dL (ref 6–24)
CO2: 20 mmol/L (ref 20–29)
Calcium: 9.7 mg/dL (ref 8.7–10.2)
Chloride: 99 mmol/L (ref 96–106)
Creatinine, Ser: 0.78 mg/dL (ref 0.76–1.27)
Glucose: 99 mg/dL (ref 70–99)
Potassium: 4.4 mmol/L (ref 3.5–5.2)
Sodium: 139 mmol/L (ref 134–144)
eGFR: 109 mL/min/{1.73_m2} (ref >59)

## 2023-09-16 LAB — CBC

## 2023-09-17 ENCOUNTER — Telehealth: Payer: Self-pay

## 2023-09-17 NOTE — Telephone Encounter (Signed)
VM received from Kayla/[facility]/[phone]; states something went wrong on their end with the specimen and pt will need to RTC for re-collect. ATC pt to RTC, LVM for pt to [facility].

## 2023-09-22 NOTE — Plan of Care (Signed)
 [facility] Tonsillectomy/Adenoidectomy, Postoperative PEDS care plan entered in error.

## 2023-11-23 ENCOUNTER — Emergency Department (HOSPITAL_COMMUNITY)
Admission: EM | Admit: 2023-11-23 | Discharge: 2023-11-24 | Disposition: A | Discharge status: Home / Self Care | Payer: 59 | Attending: Emergency Medicine | Admitting: Emergency Medicine

## 2023-11-23 ENCOUNTER — Encounter (HOSPITAL_COMMUNITY): Payer: Self-pay

## 2023-11-23 ENCOUNTER — Other Ambulatory Visit: Payer: Self-pay

## 2023-11-23 DIAGNOSIS — R21 Rash and other nonspecific skin eruption: Secondary | ICD-10-CM | POA: Diagnosis present

## 2023-11-23 DIAGNOSIS — L309 Dermatitis, unspecified: Secondary | ICD-10-CM | POA: Insufficient documentation

## 2023-11-23 NOTE — ED Triage Notes (Signed)
Pt has a rash all over his body, is warm to touch, red, papillar in nature. Worse around the creases in the skin. Started in November.

## 2023-11-24 MED ORDER — DIPHENHYDRAMINE HCL 25 MG PO CAPS
25.0000 mg | ORAL_CAPSULE | Freq: Once | ORAL | Status: AC
  Administered 2023-11-24: 25 mg via ORAL
  Filled 2023-11-24: qty 1

## 2023-11-24 MED ORDER — PREDNISONE 20 MG PO TABS: 40.0000 mg | ORAL_TABLET | Freq: Every day | ORAL | 0 refills | Status: DC

## 2023-11-24 MED ORDER — DEXAMETHASONE SODIUM PHOSPHATE 10 MG/ML IJ SOLN
10.0000 mg | Freq: Once | INTRAMUSCULAR | Status: AC
  Administered 2023-11-24: 10 mg via INTRAMUSCULAR
  Filled 2023-11-24: qty 1

## 2023-11-24 NOTE — ED Provider Notes (Signed)
[place] EMERGENCY DEPARTMENT AT [facility] Provider Note   CSN: [id number] Arrival date & time: 11/23/23  2056     History  Chief Complaint  Patient presents with   Rash    Joseph Fitzgerald is a 52 y.o. male with medical history of eczema.  Patient presents to ED for evaluation of rash to the torso, upper extremities, face and bilateral lower extremities.  Reports the rashes been present since November.  He reports a longstanding history of eczema, states he is never seen a dermatologist for this.  He reports that he has been using Cetaphil. Denies any shortness of breath, throat swelling, drooling.  Does not appear that the patient is having anaphylactic reaction.  Denies any new detergents or soaps.  Reports he has upcoming dermatology appointment in February.   Rash      Home Medications Prior to Admission medications   Medication Sig Start Date End Date Taking? Authorizing Provider  predniSONE (DELTASONE) 20 MG tablet Take 2 tablets (40 mg total) by mouth daily. 11/24/23  Yes Al Decant, PA-C  albuterol (VENTOLIN HFA) 108 (90 Base) MCG/ACT inhaler INHALE 2 PUFFS INTO THE LUNGS EVERY 4 HOURS AS NEEDED FOR WHEEZING OR SHORTNESS OF BREATH 08/30/22   Marcine Matar, MD  amLODipine (NORVASC) 5 MG tablet Take 1 tablet (5 mg total) by mouth daily. 04/11/23   Marcine Matar, MD  fluticasone Aleda Grana) 50 MCG/ACT nasal spray 1 spray each nostril daily x 1 week then PRN for allergy symptoms 04/11/23   Marcine Matar, MD  fluticasone furoate-vilanterol (BREO ELLIPTA) 200-25 MCG/ACT AEPB Inhale 1 puff into the lungs daily. 08/14/23   Marcine Matar, MD  ipratropium-albuterol (DUONEB) 0.5-2.5 (3) MG/3ML SOLN Take 3 mLs by nebulization every 6 (six) hours. 09/10/19   Black, Lesle Chris, NP  loratadine (CLARITIN) 10 MG tablet Take 1 tablet (10 mg total) by mouth daily. 08/14/23   Marcine Matar, MD  olopatadine (PATANOL) 0.1 % ophthalmic solution Place 1 drop into  both eyes 2 (two) times daily. 01/26/21   Marcine Matar, MD  triamcinolone (KENALOG) 0.025 % ointment Apply to affected area BID x 1-2 weeks then PRN.  Do not use on face. 04/11/23   Marcine Matar, MD      Allergies    Penicillins    Review of Systems   Review of Systems  Skin:  Positive for rash.  All other systems reviewed and are negative.   Physical Exam Updated Vital Signs BP 121/71 (BP Location: Left Arm)   Pulse 93   Temp 97.9 F (36.6 C) (Oral)   Resp 20   Ht 5\' 2"  (1.575 m)   Wt 71.7 kg   SpO2 98%   BMI 28.90 kg/m  Physical Exam Vitals and nursing note reviewed.  Constitutional:      General: He is not in acute distress.    Appearance: Normal appearance. He is not ill-appearing, toxic-appearing or diaphoretic.  HENT:     Head: Normocephalic and atraumatic.     Nose: Nose normal.     Mouth/Throat:     Mouth: Mucous membranes are moist.     Pharynx: Oropharynx is clear.  Eyes:     Extraocular Movements: Extraocular movements intact.     Conjunctiva/sclera: Conjunctivae normal.     Pupils: Pupils are equal, round, and reactive to light.  Cardiovascular:     Rate and Rhythm: Normal rate and regular rhythm.  Pulmonary:     Effort:  Pulmonary effort is normal.     Breath sounds: Normal breath sounds. No wheezing.  Abdominal:     General: Abdomen is flat. Bowel sounds are normal.     Palpations: Abdomen is soft.     Tenderness: There is no abdominal tenderness.  Musculoskeletal:     Cervical back: Normal range of motion and neck supple. No tenderness.  Skin:    General: Skin is warm and dry.     Capillary Refill: Capillary refill takes less than 2 seconds.     Comments: Diffuse erythema noted throughout entirety of face, extending to the upper back and upper chest.  Rash to bilateral upper extremities and lower extremities.  Extending to antecubital spaces.  Very patchy and scaly dried skin noted throughout.    Neurological:     Mental Status: He is  alert and oriented to person, place, and time.     ED Results / Procedures / Treatments   Labs (all labs ordered are listed, but only abnormal results are displayed) Labs Reviewed - No data to display  EKG None  Radiology No results found.  Procedures Procedures   Medications Ordered in ED Medications  dexamethasone (DECADRON) injection 10 mg (10 mg Intramuscular Given 11/24/23 0956)  diphenhydrAMINE (BENADRYL) capsule 25 mg (25 mg Oral Given 11/24/23 5784)    ED Course/ Medical Decision Making/ A&P  Medical Decision Making Risk Prescription drug management.   52 year old male presents for evaluation of rash.  Please see HPI for further details.  On examination patient is afebrile and nontachycardic.  His lung sounds are clear bilaterally, he is nonhypoxic.  Abdomen soft compressible throughout.  Neurological examination is at baseline.  The patient does have a scaly erythematous rash to his bilateral upper and lower extremities, upper chest and torso.  Appears to be underlying eczema.  Patient provided Decadron, Benadryl here.  Patient will be sent home with steroid burst.  Advised to follow-up with dermatology as early convenience.  No signs of anaphylaxis my examination.  Also encouraged him to take Benadryl for itching.  Stable to discharge home.   Final Clinical Impression(s) / ED Diagnoses Final diagnoses:  Rash  Eczema, unspecified type    Rx / DC Orders ED Discharge Orders          Ordered    predniSONE (DELTASONE) 20 MG tablet  Daily        11/24/23 1041              Al Decant, [place] 11/24/23 1041    Laurence Spates, MD 11/25/23 1100

## 2023-11-24 NOTE — Discharge Instructions (Signed)
Please follow-up with dermatology in February as we discussed.  Please begin taking steroids tomorrow on 12/28.  You will take 40 mg for the next 4 days.  Please continue taking Benadryl 25 mg every 6 hours as needed for itching.  Please return to ED with any new symptoms.

## 2023-12-06 ENCOUNTER — Encounter: Payer: Self-pay | Admitting: Dermatology

## 2023-12-06 ENCOUNTER — Ambulatory Visit (INDEPENDENT_AMBULATORY_CARE_PROVIDER_SITE_OTHER): Payer: 59 | Admitting: Dermatology

## 2023-12-06 DIAGNOSIS — L299 Pruritus, unspecified: Secondary | ICD-10-CM | POA: Diagnosis not present

## 2023-12-06 DIAGNOSIS — L538 Other specified erythematous conditions: Secondary | ICD-10-CM | POA: Diagnosis not present

## 2023-12-06 DIAGNOSIS — L308 Other specified dermatitis: Secondary | ICD-10-CM | POA: Diagnosis not present

## 2023-12-06 DIAGNOSIS — R21 Rash and other nonspecific skin eruption: Secondary | ICD-10-CM

## 2023-12-06 MED ORDER — CLOBETASOL PROPIONATE 0.05 % EX CREA
1.0000 | TOPICAL_CREAM | Freq: Two times a day (BID) | CUTANEOUS | 0 refills | Status: DC
Start: 2023-12-06 — End: 2023-12-14

## 2023-12-06 MED ORDER — PREDNISONE 10 MG PO TABS
ORAL_TABLET | ORAL | 0 refills | Status: AC
Start: 2023-12-06 — End: 2023-12-30

## 2023-12-06 NOTE — Progress Notes (Signed)
 New Patient Visit   Subjective  Joseph Fitzgerald is a 53 y.o. male who presents for the following: New Pt - rash  Patient states he  has rash located at the scattered that he  would like to have examined. Patient reports the areas have been there for 3 months. He reports the areas are bothersome.Patient rates irritation 10 out of 10. He states that the areas have not spread. Patient reports he  has previously been treated for these areas and was Rx prednisone  but it did not help. He has not used any new lotions, soaps or fragrances. Patient denies Hx of bx. Patient denies family history of skin cancer(s).   The following portions of the chart were reviewed this encounter and updated as appropriate: medications, allergies, medical history  Review of Systems:  No other skin or systemic complaints except as noted in HPI or Assessment and Plan.  Objective  Well appearing patient in no apparent distress; mood and affect are within normal limits.   A focused examination was performed of the following areas: scattered throughout the body   Relevant exam findings are noted in the Assessment and Plan.               Assessment & Plan   Erythrodermic Rash with Pruritus Assessment: Patient presents with a severely pruritic (10/10) erythrodermic rash with significant scaling and peeling, ongoing for 3 months. Previous treatments with oral prednisone  and topical cream provided temporary relief. Physical exam reveals palpable lymph nodes in the left and right anterior cervical chain and left occipital region, approximately 1 cm in size. Differential diagnoses include eczema and cutaneous T-cell lymphoma (CTCL). Plan:   Perform skin biopsy: 2 samples from left abdomen and mid back.   Prescribe prednisone  taper: 60 mg initial dose, tapering over 24 days (6 tablets for 4 days, 5 tablets for 4 days, 4 tablets for 4 days, 3 tablets for 4 days, 1 tablet for 4 days).   Prescribe clobetasol  cream mixed  with anti-itch lotion for topical application.   Monitor blood pressure during prednisone  treatment.   Follow-up in 2 weeks for suture removal and biopsy results review.   RASH Left Abdomen (side) - Upper, Left Lower Back Skin / nail biopsy - Left Abdomen (side) - Upper, Left Lower Back Type of biopsy: punch   Informed consent: discussed and consent obtained   Timeout: patient name, date of birth, surgical site, and procedure verified   Procedure prep:  Patient was prepped and draped in usual sterile fashion Prep type:  Isopropyl alcohol Anesthesia: the lesion was anesthetized in a standard fashion   Anesthetic:  1% lidocaine w/ epinephrine 1-100,000 buffered w/ 8.4% NaHCO3 Punch size:  4 mm Suture size:  4-0 Suture type: Prolene (polypropylene)   Hemostasis achieved with: suture and aluminum chloride   Outcome: patient tolerated procedure well   Post-procedure details: sterile dressing applied and wound care instructions given   Dressing type: petrolatum gauze   Specimen 1 - Surgical pathology Differential Diagnosis: r/o eczema vs CTCL  Check Margins: yes  Specimen 2 - Surgical pathology Differential Diagnosis: r/o eczema vs CTCL  Check Margins: yes Related Medications predniSONE  (DELTASONE ) 10 MG tablet Take 6 tablets (60 mg total) by mouth daily with breakfast for 4 days, THEN 5 tablets (50 mg total) daily with breakfast for 4 days, THEN 4 tablets (40 mg total) daily with breakfast for 4 days, THEN 3 tablets (30 mg total) daily with breakfast for 4 days, THEN 2 tablets (  20 mg total) daily with breakfast for 4 days, THEN 1 tablet (10 mg total) daily with breakfast for 4 days. clobetasol  cream (TEMOVATE ) 0.05 % Apply 1 Application topically 2 (two) times daily. Mix with CeraVe Anti-itch. Apply to affected areas twice a day.  No follow-ups on file.    Documentation: I have reviewed the above documentation for accuracy and completeness, and I agree with the above.   I,  Shirron Maranda, CMA, am acting as scribe for [employer], DO.   Delon Lenis, DO

## 2023-12-06 NOTE — Patient Instructions (Addendum)
 Dear Joseph Fitzgerald,  Thank you for visiting my office today. Your dedication to addressing your health concerns is greatly appreciated, and I am committed to supporting you throughout this process.  Here is a summary of the key instructions from today's consultation:  Medications Prescribed:   Prednisone : Start with 60 mg daily. Tapering schedule is as follows: 10 mg, six tablets for four days, then reduce by one tablet every four days until you reach one tablet for the final four days.   Clobetasol  Cream: Apply a small amount mixed with an anti-itch lotion over the affected areas as instructed.  Laboratory Tests and Examinations:   Skin Biopsy: Two samples were taken today from your left abdomen and mid-back to assist in diagnosing the cause of your rash. We will discuss the results during our follow-up appointment.  Lifestyle Advice:   Monitor your blood pressure regularly, especially important as prednisone  can elevate blood pressure. Continue with your blood pressure medication and limit salt intake.  Follow-Up Appointment:   Please return in 2 weeks to remove the sutures and to review the biopsy results. At this time, we will also discuss long-term treatment plans based on these findings.  Additional Recommendations:   Consult with your family doctor about a referral to a GI doctor for a colonoscopy, and ensure your PSA levels are checked regularly.  Thank you once again for your visit today. Please adhere to the treatment plan as outlined and do not hesitate to contact my office if you have any questions or concerns.  Warm regards,  Dr. [facility]  Patient Handout: Wound Care for Skin Biopsy Site  Taking Care of Your Skin Biopsy Site  Proper care of the biopsy site is essential for promoting healing and minimizing scarring. This handout provides instructions on how to care for your biopsy site to ensure optimal recovery.  1. Cleaning the Wound:  Clean the biopsy  site daily with gentle soap and water . Gently pat the area dry with a clean, soft towel. Avoid harsh scrubbing or rubbing the area, as this can irritate the skin and delay healing.  2. Applying Aquaphor and Bandage:  After cleaning the wound, apply a thin layer of Aquaphor ointment to the biopsy site. Cover the area with a sterile bandage to protect it from dirt, bacteria, and friction. Change the bandage daily or as needed if it becomes soiled or wet.  3. Continued Care for One Week:  Repeat the cleaning, Aquaphor application, and bandaging process daily for one week following the biopsy procedure. Keeping the wound clean and moist during this initial healing period will help prevent infection and promote optimal healing.  4. Massaging Aquaphor into the Area:  ---After one week, discontinue the use of bandages but continue to apply Aquaphor to the biopsy site. ----Gently massage the Aquaphor into the area using circular motions. ---Massaging the skin helps to promote circulation and prevent the formation of scar tissue.   Additional Tips:  Avoid exposing the biopsy site to direct sunlight during the healing process, as this can cause hyperpigmentation or worsen scarring. If you experience any signs of infection, such as increased redness, swelling, warmth, or drainage from the wound, contact your healthcare provider immediately. Follow any additional instructions provided by your healthcare provider for caring for the biopsy site and managing any discomfort. Conclusion:  Taking proper care of your skin biopsy site is crucial for ensuring optimal healing and minimizing scarring. By following these instructions for cleaning, applying Aquaphor, and massaging  the area, you can promote a smooth and successful recovery. If you have any questions or concerns about caring for your biopsy site, don't hesitate to contact your healthcare provider for guidance.     Important Information  Due  to recent changes in healthcare laws, you may see results of your pathology and/or laboratory studies on MyChart before the doctors have had a chance to review them. We understand that in some cases there may be results that are confusing or concerning to you. Please understand that not all results are received at the same time and often the doctors may need to interpret multiple results in order to provide you with the best plan of care or course of treatment. Therefore, we ask that you please give us  2 business days to thoroughly review all your results before contacting the office for clarification. Should we see a critical lab result, you will be contacted sooner.   If You Need Anything After Your Visit  If you have any questions or concerns for your doctor, please call our main line at [phone] If no one answers, please leave a voicemail as directed and we will return your call as soon as possible. Messages left after 4 pm will be answered the following business day.   You may also send us  a message via MyChart. We typically respond to MyChart messages within 1-2 business days.  For prescription refills, please ask your pharmacy to contact our office. Our fax number is [phone].  If you have an urgent issue when the clinic is closed that cannot wait until the next business day, you can page your doctor at the number below.    Please note that while we do our best to be available for urgent issues outside of office hours, we are not available 24/7.   If you have an urgent issue and are unable to reach us , you may choose to seek medical care at your doctor's office, retail clinic, urgent care center, or emergency room.  If you have a medical emergency, please immediately call 911 or go to the emergency department. In the event of inclement weather, please call our main line at [phone] for an update on the status of any delays or closures.  Dermatology Medication Tips: Please keep  the boxes that topical medications come in in order to help keep track of the instructions about where and how to use these. Pharmacies typically print the medication instructions only on the boxes and not directly on the medication tubes.   If your medication is too expensive, please contact our office at [phone] or send us  a message through MyChart.   We are unable to tell what your co-pay for medications will be in advance as this is different depending on your insurance coverage. However, we may be able to find a substitute medication at lower cost or fill out paperwork to get insurance to cover a needed medication.   If a prior authorization is required to get your medication covered by your insurance company, please allow us  1-2 business days to complete this process.  Drug prices often vary depending on where the prescription is filled and some pharmacies may offer cheaper prices.  The website www.goodrx.com contains coupons for medications through different pharmacies. The prices here do not account for what the cost may be with help from insurance (it may be cheaper with your insurance), but the website can give you the price if you did not use any insurance.  - You  can print the associated coupon and take it with your prescription to the pharmacy.  - You may also stop by our office during regular business hours and pick up a GoodRx coupon card.  - If you need your prescription sent electronically to a different pharmacy, notify our office through [facility] or by phone at [phone]

## 2023-12-08 LAB — SURGICAL PATHOLOGY

## 2023-12-11 NOTE — Progress Notes (Signed)
 Pt's bx confirms a diagnosis of atopic dermatitis over CTCL.  He's currently on oral prednisone  and topical clobetasol .  We will initiate Dupixent at his follow up visit this week and I will disucss the bx results in detail with the pt.  Diagnosis 1. Skin , left abdomen (side) - upper PSORIASIFORM AND SPONGIOTIC DERMATITIS, SEE DESCRIPTION 2. Skin , left lower back PSORIASIFORM AND SPONGIOTIC DERMATITIS, SEE DESCRIPTION Microscopic Description 1. There is regular acanthosis with spongiosis and parakeratosis. Infiltrates of lymphocytes and eosinophils are present within the dermis. The pattern is that of a chronic spongiotic process. These changes may be seen in a variety of settings, including contact dermatitis, nummular dermatitis and also atopic dermatitis. A special stain (PAS-F) is performed to determine the presence or absence of fungal hyphae in this biopsy. The PAS stain is negative for fungal hyphae, and the controls stained appropriately. 2. There is regular acanthosis with spongiosis and parakeratosis. Infiltrates of lymphocytes and eosinophils are present within the dermis. The pattern is that of a chronic spongiotic process. These changes may be seen in a variety of settings, including contact dermatitis, nummular dermatitis and also atopic dermatitis. There is no significant atypia in the lymphocyte population. Small numbers of lymphocytes extend into the overlying epidermis, but the changes are not diagnostic of a cutaneous lymphoproliferative disorder. Nonetheless, follow-up with rebiopsy if the process persists is suggested. CD3 highlights the majority of lymphocytes in this lesion with a slight increase in CD4 over CD8 in the epidermis, but the findings are not diagnostic of mycosis fungoides. Dr. Lorriane has a similar opinion. (NDS:kh 12/08/23)

## 2023-12-14 ENCOUNTER — Ambulatory Visit: Payer: 59 | Attending: Internal Medicine | Admitting: Internal Medicine

## 2023-12-14 ENCOUNTER — Encounter: Payer: Self-pay | Admitting: Internal Medicine

## 2023-12-14 VITALS — BP 108/70 | HR 67 | Temp 98.0°F | Ht 62.0 in | Wt 162.0 lb

## 2023-12-14 DIAGNOSIS — R21 Rash and other nonspecific skin eruption: Secondary | ICD-10-CM | POA: Diagnosis not present

## 2023-12-14 DIAGNOSIS — J4540 Moderate persistent asthma, uncomplicated: Secondary | ICD-10-CM

## 2023-12-14 DIAGNOSIS — Z23 Encounter for immunization: Secondary | ICD-10-CM | POA: Diagnosis not present

## 2023-12-14 DIAGNOSIS — I10 Essential (primary) hypertension: Secondary | ICD-10-CM | POA: Diagnosis not present

## 2023-12-14 MED ORDER — FLUTICASONE FUROATE-VILANTEROL 200-25 MCG/ACT IN AEPB: 1.0000 | INHALATION_SPRAY | Freq: Every day | RESPIRATORY_TRACT | 11 refills | Status: AC

## 2023-12-14 MED ORDER — ZOSTER VAC RECOMB ADJUVANTED 50 MCG/0.5ML IM SUSR
0.5000 mL | Freq: Once | INTRAMUSCULAR | 0 refills | Status: AC
Start: 2023-12-14 — End: 2023-12-14

## 2023-12-14 MED ORDER — CLOBETASOL PROPIONATE 0.05 % EX CREA
1.0000 | TOPICAL_CREAM | Freq: Two times a day (BID) | CUTANEOUS | 0 refills | Status: DC
Start: 2023-12-14 — End: 2024-04-12

## 2023-12-14 MED ORDER — AMLODIPINE BESYLATE 5 MG PO TABS
5.0000 mg | ORAL_TABLET | Freq: Every day | ORAL | 1 refills | Status: DC
Start: 2023-12-14 — End: 2024-04-12

## 2023-12-14 NOTE — Progress Notes (Signed)
Patient ID: Joseph Fitzgerald, male    DOB: 1971-08-10  MRN: [id number]  CC: Hyperlipidemia (Hyperlipidemia f/u. Med refill. /No questions / concerns/Yes to shingles vax)   Subjective: Joseph Fitzgerald is a 53 y.o. male who presents for chronic ds management. His concerns today include:  Pt with HTN, HL, mod persistent asthma, obesity, environmental allergies and eczema, loculated pleural effusion left side requiring fibrinolytic therapy through chest tube 12/2020, aortic atherosclerosis on chest CT 11/2020   HTN: Compliant with Norvasc.  Denies any chest pains, shortness of breath or lower extremity edema at this time.  Asthma/allergies/eczema: Using Breo as prescribed. No recent flares. No chronic cough. C/o itching and redness of skin x 3 mths.  Started on face then spread all over body; also had significant peeling of the skin.  Saw derm Dr. Onalee Hua 8 days ago.  Given Prednisone taper and Clobetasol cream.  Still on Prednisone which is helping.  Skin bx done; has f/u next wk with derm to discussed results.  Looks like bx confirms diagnosis of atopic dermatitis.  HM:  yes to shingles vaccine Patient Active Problem List   Diagnosis Date Noted   Necrotizing pneumonia (HCC) 09/02/2021   Chest tube in place    COVID-19 virus infection 12/31/2020   Loculated pleural effusion 12/31/2020   Asthma    Poor dentition    Aortic atherosclerosis (HCC)    Mass of lower lobe of left lung    Marijuana abuse    Tobacco abuse    Dental caries 12/07/2020   Hypokalemia 03/17/2018   Hyperglycemia 03/17/2018   Asthma, moderate persistent 08/17/2016   Vitamin D deficiency 09/01/2015   Family history of diabetes mellitus in brother 08/31/2015   Eczema 08/31/2015   Environmental allergies 04/16/2014     Current Outpatient Medications on File Prior to Visit  Medication Sig Dispense Refill   albuterol (VENTOLIN HFA) 108 (90 Base) MCG/ACT inhaler INHALE 2 PUFFS INTO THE LUNGS EVERY 4 HOURS AS NEEDED FOR  WHEEZING OR SHORTNESS OF BREATH 18 g 2   fluticasone (FLONASE) 50 MCG/ACT nasal spray 1 spray each nostril daily x 1 week then PRN for allergy symptoms 16 g 6   ipratropium-albuterol (DUONEB) 0.5-2.5 (3) MG/3ML SOLN Take 3 mLs by nebulization every 6 (six) hours. 360 mL 1   loratadine (CLARITIN) 10 MG tablet Take 1 tablet (10 mg total) by mouth daily. 100 tablet 1   olopatadine (PATANOL) 0.1 % ophthalmic solution Place 1 drop into both eyes 2 (two) times daily. 5 mL 0   predniSONE (DELTASONE) 10 MG tablet Take 6 tablets (60 mg total) by mouth daily with breakfast for 4 days, THEN 5 tablets (50 mg total) daily with breakfast for 4 days, THEN 4 tablets (40 mg total) daily with breakfast for 4 days, THEN 3 tablets (30 mg total) daily with breakfast for 4 days, THEN 2 tablets (20 mg total) daily with breakfast for 4 days, THEN 1 tablet (10 mg total) daily with breakfast for 4 days. 84 tablet 0   triamcinolone (KENALOG) 0.025 % ointment Apply to affected area BID x 1-2 weeks then PRN.  Do not use on face. 30 g 1   No current facility-administered medications on file prior to visit.    Allergies  Allergen Reactions   Penicillins Rash    Did it involve swelling of the face/tongue/throat, SOB, or low BP?No Did it involve sudden or severe rash/hives, skin peeling, or any reaction on the inside of your mouth or nose?Yes  Did you need to seek medical attention at a hospital or doctor's office? no When did it last happen? Child If all above answers are "NO", may proceed with cephalosporin use.    Social History   Socioeconomic History   Marital status: Single    Spouse name: Not on file   Number of children: 3   Years of education: 12   Highest education level: Not on file  Occupational History   Occupation: Cook   Tobacco Use   Smoking status: Never   Smokeless tobacco: Former    Types: Associate Professor status: Never Used  Substance and Sexual Activity   Alcohol use: Not Currently     Comment: beer   Drug use: Not Currently    Types: Marijuana   Sexual activity: Yes  Other Topics Concern   Not on file  Social History Narrative   Not on file   Social Drivers of Health   Financial Resource Strain: Low Risk  (12/14/2023)   Overall Financial Resource Strain (CARDIA)    Difficulty of Paying Living Expenses: Not very hard  Food Insecurity: No Food Insecurity (12/14/2023)   Hunger Vital Sign    Worried About Running Out of Food in the Last Year: Never true    Ran Out of Food in the Last Year: Never true  Transportation Needs: No Transportation Needs (12/14/2023)   PRAPARE - Administrator, Civil Service (Medical): No    Lack of Transportation (Non-Medical): No  Physical Activity: Inactive (12/14/2023)   Exercise Vital Sign    Days of Exercise per Week: 0 days    Minutes of Exercise per Session: 0 min  Stress: No Stress Concern Present (12/14/2023)   [employer] of Occupational Health - Occupational Stress Questionnaire    Feeling of Stress : Only a little  Social Connections: Moderately Isolated (12/14/2023)   Social Connection and Isolation Panel [NHANES]    Frequency of Communication with Friends and Family: Never    Frequency of Social Gatherings with Friends and Family: Once a week    Attends Religious Services: More than 4 times per year    Active Member of [employer] or Organizations: No    Attends Banker Meetings: Never    Marital Status: Married  Catering manager Violence: Not At Risk (12/14/2023)   Humiliation, Afraid, Rape, and Kick questionnaire    Fear of Current or Ex-Partner: No    Emotionally Abused: No    Physically Abused: No    Sexually Abused: No    Family History  Problem Relation Age of Onset   Asthma Brother    Diabetes Brother     Past Surgical History:  Procedure Laterality Date   NO PAST SURGERIES      ROS: Review of Systems Negative except as stated above  PHYSICAL EXAM: BP 108/70 (BP Location:  Left Arm, Patient Position: Sitting, Cuff Size: Normal)   Pulse 67   Temp 98 F (36.7 C) (Oral)   Ht 5\' 2"  (1.575 m)   Wt 162 lb (73.5 kg)   SpO2 100%   BMI 29.63 kg/m   Wt Readings from Last 3 Encounters:  12/14/23 162 lb (73.5 kg)  11/23/23 158 lb (71.7 kg)  08/14/23 161 lb (73 kg)    Physical Exam General appearance - alert, well appearing, and in no distress Mental status - normal mood, behavior, speech, dress, motor activity, and thought processes Chest - clear to auscultation, no wheezes, rales  or rhonchi, symmetric air entry Heart - normal rate, regular rhythm, normal S1, S2, no murmurs, rubs, clicks or gallops Skin -skin is very dry with fissuring     Latest Ref Rng & Units 09/12/2023    2:50 PM 12/06/2022    3:27 PM 04/04/2022    2:39 PM  CMP  Glucose 70 - 99 mg/dL 99  99  409   BUN 6 - 24 mg/dL 12  12  15    Creatinine 0.76 - 1.27 mg/dL 8.11  9.14  7.82   Sodium 134 - 144 mmol/L 139  141  142   Potassium 3.5 - 5.2 mmol/L 4.4  4.3  5.1   Chloride 96 - 106 mmol/L 99  105  102   CO2 20 - 29 mmol/L 20  23  23    Calcium 8.7 - 10.2 mg/dL 9.7  9.3  9.5   Total Protein 6.0 - 8.5 g/dL  7.3  7.3   Total Bilirubin 0.0 - 1.2 mg/dL  0.2  0.3   Alkaline Phos 44 - 121 IU/L  113  129   AST 0 - 40 IU/L  20  22   ALT 0 - 44 IU/L  15  13    Lipid Panel     Component Value Date/Time   CHOL 253 (H) 12/06/2022 1527   TRIG 168 (H) 12/06/2022 1527   HDL 53 12/06/2022 1527   CHOLHDL 4.8 12/06/2022 1527   CHOLHDL 3.8 12/31/2020 0906   VLDL 6 12/31/2020 0906   LDLCALC 169 (H) 12/06/2022 1527    CBC    Component Value Date/Time   WBC CANCELED 09/12/2023 1450   WBC 8.2 01/07/2021 0756   RBC CANCELED 09/12/2023 1450   RBC 4.41 01/07/2021 0756   HGB CANCELED 09/12/2023 1450   HCT CANCELED 09/12/2023 1450   PLT CANCELED 09/12/2023 1450   MCV 93 04/04/2022 1439   MCH 32.1 04/04/2022 1439   MCH 30.4 01/07/2021 0756   MCHC 34.6 04/04/2022 1439   MCHC 34.3 01/07/2021 0756   RDW  12.7 04/04/2022 1439   LYMPHSABS 1.0 01/05/2021 0052   LYMPHSABS 2.4 10/19/2020 1012   MONOABS 1.3 (H) 01/05/2021 0052   EOSABS 0.1 01/05/2021 0052   EOSABS 0.5 (H) 10/19/2020 1012   BASOSABS 0.0 01/05/2021 0052   BASOSABS 0.1 10/19/2020 1012    ASSESSMENT AND PLAN: 1. Essential hypertension (Primary) Control.  Continue Norvasc. - amLODipine (NORVASC) 5 MG tablet; Take 1 tablet (5 mg total) by mouth daily.  Dispense: 90 tablet; Refill: 1  2. Moderate persistent asthma with allergic rhinitis without complication Controlled with Breo. - fluticasone furoate-vilanterol (BREO ELLIPTA) 200-25 MCG/ACT AEPB; Inhale 1 puff into the lungs daily.  Dispense: 60 each; Refill: 11  3. Rash Keep follow-up appointment with dermatology next week - clobetasol cream (TEMOVATE) 0.05 %; Apply 1 Application topically 2 (two) times daily. Mix with CeraVe Anti-itch. Apply to affected areas twice a day.  Dispense: 30 g; Refill: 0  4. Need for shingles vaccine I printed prescription for the Shingrix vaccine and gave to him.  Advised to wait until he has completed the current course of prednisone and then take it to any outside pharmacy to get the vaccine - Zoster Vaccine Adjuvanted [facility]) injection; Inject 0.5 mLs into the muscle once for 1 dose.  Dispense: 0.5 mL; Refill: 0    Patient was given the opportunity to ask questions.  Patient verbalized understanding of the plan and was able to repeat key elements of the  plan.   This documentation was completed using Paediatric nurse.  Any transcriptional errors are unintentional.  No orders of the defined types were placed in this encounter.    Requested Prescriptions   Signed Prescriptions Disp Refills   clobetasol cream (TEMOVATE) 0.05 % 30 g 0    Sig: Apply 1 Application topically 2 (two) times daily. Mix with CeraVe Anti-itch. Apply to affected areas twice a day.   fluticasone furoate-vilanterol (BREO ELLIPTA) 200-25 MCG/ACT  AEPB 60 each 11    Sig: Inhale 1 puff into the lungs daily.   amLODipine (NORVASC) 5 MG tablet 90 tablet 1    Sig: Take 1 tablet (5 mg total) by mouth daily.   Zoster Vaccine Adjuvanted [facility]) injection 0.5 mL 0    Sig: Inject 0.5 mLs into the muscle once for 1 dose.    Return in about 4 months (around 04/12/2024).  Jonah Blue, MD, FACP

## 2023-12-20 ENCOUNTER — Encounter: Payer: Self-pay | Admitting: Dermatology

## 2023-12-20 ENCOUNTER — Ambulatory Visit: Payer: 59 | Admitting: Dermatology

## 2023-12-20 VITALS — BP 120/79 | HR 72

## 2023-12-20 DIAGNOSIS — L209 Atopic dermatitis, unspecified: Secondary | ICD-10-CM

## 2023-12-20 DIAGNOSIS — L2089 Other atopic dermatitis: Secondary | ICD-10-CM

## 2023-12-20 NOTE — Patient Instructions (Addendum)
Hello Joseph Fitzgerald,  Thank you for visiting my office today. Here is a summary of the key instructions and next steps from our consultation:  Medications:   Topical Cream: Continue applying the clobetasol cream twice daily.   Ebglyss Injections: We administered the first two injections today.  Your next injection will be in 2 weeks, then one injection every 2 weeks until week 14 and the 1 injection every month.     Oral Prednisone: As discussed, please discontinue the use of oral prednisone.  Lifestyle Adjustments:   Oatmeal Baths: To help soothe your skin, consider trying oatmeal baths using Aveeno packets.  Follow-Up Care:   Prescription for Ebglyss: We have sent the prescription to the specialty  pharmacy [facility]. It may take 1-2 weeks to confirm approval from your insurance.   Next Appointment: Schedule a follow-up visit in 3 months to assess progress and adjust treatment as necessary.  Diagnosis:   Your biopsy results have confirmed atopic dermatitis with severe symptoms.  Please ensure to follow the instructions carefully. If you have any questions or concerns before our next meeting, do not hesitate to contact our office.  Best regards,  Dr. [facility]    Important Information  Due to recent changes in healthcare laws, you may see results of your pathology and/or laboratory studies on MyChart before the doctors have had a chance to review them. We understand that in some cases there may be results that are confusing or concerning to you. Please understand that not all results are received at the same time and often the doctors may need to interpret multiple results in order to provide you with the best plan of care or course of treatment. Therefore, we ask that you please give [place] 2 business days to thoroughly review all your results before contacting the office for clarification. Should we see a critical lab result, you will be contacted sooner.   If You Need  Anything After Your Visit  If you have any questions or concerns for your doctor, please call our main line at [phone] If no one answers, please leave a voicemail as directed and we will return your call as soon as possible. Messages left after 4 pm will be answered the following business day.   You may also send [place] a message via MyChart. We typically respond to MyChart messages within 1-2 business days.  For prescription refills, please ask your pharmacy to contact our office. Our fax number is [phone].  If you have an urgent issue when the clinic is closed that cannot wait until the next business day, you can page your doctor at the number below.    Please note that while we do our best to be available for urgent issues outside of office hours, we are not available 24/7.   If you have an urgent issue and are unable to reach [place], you may choose to seek medical care at your doctor's office, retail clinic, urgent care center, or emergency room.  If you have a medical emergency, please immediately call 911 or go to the emergency department. In the event of inclement weather, please call our main line at [phone] for an update on the status of any delays or closures.  Dermatology Medication Tips: Please keep the boxes that topical medications come in in order to help keep track of the instructions about where and how to use these. Pharmacies typically print the medication instructions only on the boxes and not directly on the medication tubes.  If your medication is too expensive, please contact our office at [phone] or send [place] a message through MyChart.   We are unable to tell what your co-pay for medications will be in advance as this is different depending on your insurance coverage. However, we may be able to find a substitute medication at lower cost or fill out paperwork to get insurance to cover a needed medication.   If a prior authorization is required to get your  medication covered by your insurance company, please allow [place] 1-2 business days to complete this process.  Drug prices often vary depending on where the prescription is filled and some pharmacies may offer cheaper prices.  The website www.goodrx.com contains coupons for medications through different pharmacies. The prices here do not account for what the cost may be with help from insurance (it may be cheaper with your insurance), but the website can give you the price if you did not use any insurance.  - You can print the associated coupon and take it with your prescription to the pharmacy.  - You may also stop by our office during regular business hours and pick up a GoodRx coupon card.  - If you need your prescription sent electronically to a different pharmacy, notify our office through [facility] or by phone at [phone]

## 2023-12-20 NOTE — Progress Notes (Signed)
   Follow-Up Visit   Subjective  Joseph Fitzgerald is a 53 y.o. male who presents for the following: suture removal & biopsy results  Patient present today for follow up visit. Patient was last evaluated on 12/06/23 for Bx of rash. Patient reports sxs are better after taking prednisone. Patient denies medication changes.  The following portions of the chart were reviewed this encounter and updated as appropriate: medications, allergies, medical history  Review of Systems:  No other skin or systemic complaints except as noted in HPI or Assessment and Plan.  Objective  Well appearing patient in no apparent distress; mood and affect are within normal limits.   A focused examination was performed of the following areas: body entirety   Relevant exam findings are noted in the Assessment and Plan.    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 Biopsy results from two sites confirmed inflammation consistent with atopic dermatitis. History of eczema in childhood noted. Current flare described as the worst experienced, with initial itch severity rated 10/10. Physical exam shows approximately 80% body surface area affected, with erythema and improved scaling. Itch severity has reduced to 6/10 with current treatment. Prednisone has been effective but is noted as not a long-term solution due to side effects and rebound itching upon discontinuation.  Plan: Bx report was  reviewed and explain in exstensive detail with patient and wife  No SRM today, sutures fell out while pt was scratching    Discontinue oral prednisone.   Continue clobetasol cream, to be applied twice daily.   Initiate Ebglyss therapy:     We Administered the first two injections in the office (SAMPLES).     Next injection will be 2 syringes in 2 weeks, then 1 syringe q2 weeks until week 14 and then 1 syringe q month  starting on week 16      Extensive injection training was provided to patient in office.     .   Recommend Aveeno oatmeal baths for symptom relief.    Follow up in 3 months.    Anticipate improvement in itching within the first week and complete clearance within 3 months.      No follow-ups on file.    Documentation: I have reviewed the above documentation for accuracy and completeness, and I agree with the above.  I, Shirron Marcha Solders, CMA, am acting as scribe for [employer], DO.   Joseph Reusing, DO

## 2024-01-01 ENCOUNTER — Other Ambulatory Visit: Payer: Self-pay

## 2024-01-01 DIAGNOSIS — L2089 Other atopic dermatitis: Secondary | ICD-10-CM

## 2024-01-01 MED ORDER — EBGLYSS 250 MG/2ML ~~LOC~~ SOSY: 2.0000 mL | PREFILLED_SYRINGE | SUBCUTANEOUS | 7 refills | Status: AC

## 2024-01-01 MED ORDER — EBGLYSS 250 MG/2ML ~~LOC~~ SOSY: 4.0000 mL | PREFILLED_SYRINGE | SUBCUTANEOUS | 3 refills | Status: AC

## 2024-01-09 ENCOUNTER — Telehealth: Payer: Self-pay

## 2024-01-09 NOTE — Telephone Encounter (Signed)
LVM to contact [facility] at [phone] to schedule delivery of Ebglyss.

## 2024-01-10 ENCOUNTER — Ambulatory Visit: Payer: 59 | Admitting: Dermatology

## 2024-04-02 NOTE — Telephone Encounter (Signed)
 LVMTCB to update on if he has started Ebglyss.

## 2024-04-12 ENCOUNTER — Encounter: Payer: Self-pay | Admitting: Internal Medicine

## 2024-04-12 ENCOUNTER — Ambulatory Visit: Payer: 59 | Attending: Internal Medicine | Admitting: Internal Medicine

## 2024-04-12 VITALS — BP 107/67 | HR 88 | Temp 97.8°F | Ht 62.0 in | Wt 168.0 lb

## 2024-04-12 DIAGNOSIS — R21 Rash and other nonspecific skin eruption: Secondary | ICD-10-CM

## 2024-04-12 DIAGNOSIS — T50905A Adverse effect of unspecified drugs, medicaments and biological substances, initial encounter: Secondary | ICD-10-CM | POA: Diagnosis not present

## 2024-04-12 DIAGNOSIS — J4540 Moderate persistent asthma, uncomplicated: Secondary | ICD-10-CM

## 2024-04-12 DIAGNOSIS — L209 Atopic dermatitis, unspecified: Secondary | ICD-10-CM

## 2024-04-12 DIAGNOSIS — I10 Essential (primary) hypertension: Secondary | ICD-10-CM

## 2024-04-12 MED ORDER — CLINDAMYCIN HCL 300 MG PO CAPS
300.0000 mg | ORAL_CAPSULE | Freq: Three times a day (TID) | ORAL | 0 refills | Status: AC
Start: 2024-04-12 — End: ?

## 2024-04-12 MED ORDER — PREDNISONE 20 MG PO TABS
ORAL_TABLET | ORAL | 0 refills | Status: DC
Start: 2024-04-12 — End: 2024-05-01

## 2024-04-12 MED ORDER — CLOBETASOL PROPIONATE 0.05 % EX CREA: 1.0000 | TOPICAL_CREAM | Freq: Two times a day (BID) | CUTANEOUS | 0 refills | Status: AC

## 2024-04-12 MED ORDER — AMLODIPINE BESYLATE 5 MG PO TABS
5.0000 mg | ORAL_TABLET | Freq: Every day | ORAL | 1 refills | Status: DC
Start: 2024-04-12 — End: 2024-07-19

## 2024-04-12 NOTE — Patient Instructions (Signed)
 Please call Dr. Christiane Cowing office and request an urgent appointment. Hold off on further injection of the Ebglyss until you have spoke with her office.  I have sent a prescription to your pharmacy for a short course of prednisone  and have recent fill the clobetasol  cream.  Take as prescribed.  I also sent prescription to your pharmacy for an antibiotic called clindamycin to take 3 times a day for 7 days.  Please keep your legs elevated as much as possible over the next week.  Sleep with pillows under your feet so as to keep the legs elevated even when laying down.  Be seen in the emergency room if any worsening of your skin condition or if you develop fever.

## 2024-04-12 NOTE — Progress Notes (Signed)
 Patient ID: Joseph Fitzgerald, male    DOB: Feb 27, 1971  MRN: [id number]  CC: Hypertension (HTN f/u.med refill. /All over body redness & swelling, itching  X1 week/)   Subjective: Joseph Fitzgerald is a 53 y.o. male who presents for chronic ds management. Wife is with him. His concerns today include:  Pt with HTN, HL, mod persistent asthma, obesity, environmental allergies and eczema, loculated pleural effusion left side requiring fibrinolytic therapy through chest tube 12/2020, aortic atherosclerosis on chest CT 11/2020   Discussed the use of AI scribe software for clinical note transcription with the patient, who gave verbal consent to proceed.  History of Present Illness Joseph Fitzgerald is a 53 year old male with atopic dermatitis who presents for a four-month follow-up.  He experiences significant redness and itching of the skin, especially on his face, trunk and extremities with a color change from brown to red over the past two weeks.  This is associated with increased flaking and swelling of the legs.  The affected areas are intensely pruritic.  He is followed by dermatologist Dr. Myrtie Atkinson for diagnosis of atopic dermatitis.  He is on Ebglyss inj Q 2wks for the past 3 mths with the last injection on Apr 10, 2024, and feels he is beneficial and skin was clearing up well until 2 wks ago. Previously prescribed clobetasol  cream mixed with anti-itch lotion was also helpful but he ran out. Currently, he uses Vaseline, which does not relieve the itching, particularly when the skin is dry. He reports leg swelling that began three days ago, initially improved, but then worsened. No fever is present.  He thinks he has a follow-up appointment with Dr. Myrtie Atkinson next week but I do not see any follow-up appointment in the system.  He has hypertension, managed with amlodipine , and is limiting salt intake. His blood pressure is well-controlled.  He has asthma and was using the Breo inhaler daily but has been without it for a  week.  He does have refill on his current prescription.  He experienced a cough this morning but no dyspnea. He has a nebulizer at home but has not used it recently.      Patient Active Problem List   Diagnosis Date Noted   Necrotizing pneumonia (HCC) 09/02/2021   Chest tube in place    COVID-19 virus infection 12/31/2020   Loculated pleural effusion 12/31/2020   Asthma    Poor dentition    Aortic atherosclerosis (HCC)    Mass of lower lobe of left lung    Marijuana abuse    Tobacco abuse    Dental caries 12/07/2020   Hypokalemia 03/17/2018   Hyperglycemia 03/17/2018   Asthma, moderate persistent 08/17/2016   Vitamin D  deficiency 09/01/2015   Family history of diabetes mellitus in brother 08/31/2015   Eczema 08/31/2015   Environmental allergies 04/16/2014     Current Outpatient Medications on File Prior to Visit  Medication Sig Dispense Refill   albuterol  (VENTOLIN  HFA) 108 (90 Base) MCG/ACT inhaler INHALE 2 PUFFS INTO THE LUNGS EVERY 4 HOURS AS NEEDED FOR WHEEZING OR SHORTNESS OF BREATH 18 g 2   amLODipine  (NORVASC ) 5 MG tablet Take 1 tablet (5 mg total) by mouth daily. 90 tablet 1   clobetasol  cream (TEMOVATE ) 0.05 % Apply 1 Application topically 2 (two) times daily. Mix with CeraVe Anti-itch. Apply to affected areas twice a day. 30 g 0   fluticasone  (FLONASE ) 50 MCG/ACT nasal spray 1 spray each nostril daily x 1 week then PRN  for allergy symptoms 16 g 6   fluticasone  furoate-vilanterol (BREO ELLIPTA ) 200-25 MCG/ACT AEPB Inhale 1 puff into the lungs daily. 60 each 11   ipratropium-albuterol  (DUONEB) 0.5-2.5 (3) MG/3ML SOLN Take 3 mLs by nebulization every 6 (six) hours. 360 mL 1   Lebrikizumab-lbkz (EBGLYSS) 250 MG/2ML SOSY Inject 4 mLs into the skin every 14 (fourteen) days. 4 mL 3   Lebrikizumab-lbkz (EBGLYSS) 250 MG/2ML SOSY Inject 2 mLs into the skin every 30 (thirty) days. 2 mL 7   loratadine  (CLARITIN ) 10 MG tablet Take 1 tablet (10 mg total) by mouth daily. 100 tablet 1    olopatadine  (PATANOL) 0.1 % ophthalmic solution Place 1 drop into both eyes 2 (two) times daily. 5 mL 0   triamcinolone  (KENALOG ) 0.025 % ointment Apply to affected area BID x 1-2 weeks then PRN.  Do not use on face. 30 g 1   No current facility-administered medications on file prior to visit.    Allergies  Allergen Reactions   Penicillins Rash    Did it involve swelling of the face/tongue/throat, SOB, or low BP?No Did it involve sudden or severe rash/hives, skin peeling, or any reaction on the inside of your mouth or nose?Yes Did you need to seek medical attention at a hospital or doctor's office? no When did it last happen? Child If all above answers are "NO", may proceed with cephalosporin use.    Social History   Socioeconomic History   Marital status: Single    Spouse name: Not on file   Number of children: 3   Years of education: 12   Highest education level: Not on file  Occupational History   Occupation: Cook   Tobacco Use   Smoking status: Never   Smokeless tobacco: Former    Types: Associate Professor status: Never Used  Substance and Sexual Activity   Alcohol use: Not Currently    Comment: beer   Drug use: Not Currently    Types: Marijuana   Sexual activity: Yes  Other Topics Concern   Not on file  Social History Narrative   Not on file   Social Drivers of Health   Financial Resource Strain: Low Risk  (12/14/2023)   Overall Financial Resource Strain (CARDIA)    Difficulty of Paying Living Expenses: Not very hard  Food Insecurity: No Food Insecurity (12/14/2023)   Hunger Vital Sign    Worried About Running Out of Food in the Last Year: Never true    Ran Out of Food in the Last Year: Never true  Transportation Needs: No Transportation Needs (12/14/2023)   PRAPARE - Administrator, Civil Service (Medical): No    Lack of Transportation (Non-Medical): No  Physical Activity: Inactive (12/14/2023)   Exercise Vital Sign    Days of Exercise  per Week: 0 days    Minutes of Exercise per Session: 0 min  Stress: No Stress Concern Present (12/14/2023)   [employer] of Occupational Health - Occupational Stress Questionnaire    Feeling of Stress : Only a little  Social Connections: Moderately Isolated (12/14/2023)   Social Connection and Isolation Panel [NHANES]    Frequency of Communication with Friends and Family: Never    Frequency of Social Gatherings with Friends and Family: Once a week    Attends Religious Services: More than 4 times per year    Active Member of [employer] or Organizations: No    Attends Banker Meetings: Never    Marital  Status: Married  Catering manager Violence: Not At Risk (12/14/2023)   Humiliation, Afraid, Rape, and Kick questionnaire    Fear of Current or Ex-Partner: No    Emotionally Abused: No    Physically Abused: No    Sexually Abused: No    Family History  Problem Relation Age of Onset   Asthma Brother    Diabetes Brother     Past Surgical History:  Procedure Laterality Date   NO PAST SURGERIES      ROS: Review of Systems Negative except as stated above  PHYSICAL EXAM: BP 107/67 (BP Location: Left Arm, Patient Position: Sitting, Cuff Size: Normal)   Pulse 88   Temp 97.8 F (36.6 C) (Oral)   Ht 5\' 2"  (1.575 m)   Wt 168 lb (76.2 kg)   SpO2 100%   BMI 30.73 kg/m   Physical Exam   General appearance - alert, well appearing, and in no distress Mental status - normal mood, behavior, speech, dress, motor activity, and thought processes Chest - clear to auscultation, no wheezes, rales or rhonchi, symmetric air entry Heart - normal rate, regular rhythm, normal S1, S2, no murmurs, rubs, clicks or gallops Skin -patient has discoloration of skin on trunk and extremities.  Flaking and cracking of skin also noted on posterior thorax, legs and arms.  Face is erythematous          Latest Ref Rng & Units 09/12/2023    2:50 PM 12/06/2022    3:27 PM 04/04/2022    2:39 PM   CMP  Glucose 70 - 99 mg/dL 99  99  629   BUN 6 - 24 mg/dL 12  12  15    Creatinine 0.76 - 1.27 mg/dL 5.28  4.13  2.44   Sodium 134 - 144 mmol/L 139  141  142   Potassium 3.5 - 5.2 mmol/L 4.4  4.3  5.1   Chloride 96 - 106 mmol/L 99  105  102   CO2 20 - 29 mmol/L 20  23  23    Calcium  8.7 - 10.2 mg/dL 9.7  9.3  9.5   Total Protein 6.0 - 8.5 g/dL  7.3  7.3   Total Bilirubin 0.0 - 1.2 mg/dL  0.2  0.3   Alkaline Phos 44 - 121 IU/L  113  129   AST 0 - 40 IU/L  20  22   ALT 0 - 44 IU/L  15  13    Lipid Panel     Component Value Date/Time   CHOL 253 (H) 12/06/2022 1527   TRIG 168 (H) 12/06/2022 1527   HDL 53 12/06/2022 1527   CHOLHDL 4.8 12/06/2022 1527   CHOLHDL 3.8 12/31/2020 0906   VLDL 6 12/31/2020 0906   LDLCALC 169 (H) 12/06/2022 1527    CBC    Component Value Date/Time   WBC CANCELED 09/12/2023 1450   WBC 8.2 01/07/2021 0756   RBC CANCELED 09/12/2023 1450   RBC 4.41 01/07/2021 0756   HGB CANCELED 09/12/2023 1450   HCT CANCELED 09/12/2023 1450   PLT CANCELED 09/12/2023 1450   MCV 93 04/04/2022 1439   MCH 32.1 04/04/2022 1439   MCH 30.4 01/07/2021 0756   MCHC 34.6 04/04/2022 1439   MCHC 34.3 01/07/2021 0756   RDW 12.7 04/04/2022 1439   LYMPHSABS 1.0 01/05/2021 0052   LYMPHSABS 2.4 10/19/2020 1012   MONOABS 1.3 (H) 01/05/2021 0052   EOSABS 0.1 01/05/2021 0052   EOSABS 0.5 (H) 10/19/2020 1012   BASOSABS 0.0 01/05/2021  0052   BASOSABS 0.1 10/19/2020 1012    ASSESSMENT AND PLAN: 1. Atopic dermatitis in adult Patient reports improvement in his skin condition initially with the biologic agent that he is currently taking but has had some worsening over the past 2 weeks. Advised that he contact Dr. Christiane Cowing office as soon as possible to request a follow-up appointment.  I will also send her an inbox message.  Given short course of prednisone  today.  Refill on clobetasol  cream.  I have given a short course of clindamycin for possible start of cellulitis in the lower  extremities.  Advised to keep the legs elevated.  Be seen in the emergency room if any worsening over the weekend. - CBC with Differential/Platelet - predniSONE  (DELTASONE ) 20 MG tablet; 1.5 tabs PO daily x 2 days then 1 tab PO daily x 5 days  Dispense: 8 tablet; Refill: 0 - clobetasol  cream (TEMOVATE ) 0.05 %; Apply 1 Application topically 2 (two) times daily. Mix with CeraVe Anti-itch. Apply to affected areas twice a day. Do not apply to face  Dispense: 60 g; Refill: 0 - clindamycin (CLEOCIN) 300 MG capsule; Take 1 capsule (300 mg total) by mouth 3 (three) times daily.  Dispense: 21 capsule; Refill: 0  2. Adverse effect of drug, initial encounter I am not sure whether worsening of his skin condition at this time is due to the biologic agent that he is taking.  Inbox message sent to his dermatologist. - CBC with Differential/Platelet - predniSONE  (DELTASONE ) 20 MG tablet; 1.5 tabs PO daily x 2 days then 1 tab PO daily x 5 days  Dispense: 8 tablet; Refill: 0  3. Essential hypertension At goal.  Continue Norvasc  5 mg daily - amLODipine  (NORVASC ) 5 MG tablet; Take 1 tablet (5 mg total) by mouth daily.  Dispense: 90 tablet; Refill: 1 - Comprehensive metabolic panel with GFR  4. Moderate persistent asthma with allergic rhinitis without complication (Primary) Reports no recent flareups.  However I strongly advised him to get back on the Breo inhaler.   Patient was given the opportunity to ask questions.  Patient verbalized understanding of the plan and was able to repeat key elements of the plan.   This documentation was completed using Paediatric nurse.  Any transcriptional errors are unintentional.  No orders of the defined types were placed in this encounter.    Requested Prescriptions   Pending Prescriptions Disp Refills   amLODipine  (NORVASC ) 5 MG tablet 90 tablet 1    Sig: Take 1 tablet (5 mg total) by mouth daily.    No follow-ups on file.  Concetta Dee,  MD, FACP

## 2024-04-13 ENCOUNTER — Ambulatory Visit: Payer: Self-pay | Admitting: Internal Medicine

## 2024-04-13 LAB — COMPREHENSIVE METABOLIC PANEL WITH GFR
ALT: 19 IU/L (ref 0–44)
AST: 33 IU/L (ref 0–40)
Albumin: 3.5 g/dL — ABNORMAL LOW (ref 4.1–5.1)
Alkaline Phosphatase: 87 IU/L (ref 44–121)
BUN/Creatinine Ratio: 11 (ref 9–20)
BUN: 8 mg/dL (ref 6–24)
Bilirubin Total: 0.2 mg/dL (ref 0.0–1.2)
CO2: 25 mmol/L (ref 20–29)
Calcium: 8.8 mg/dL (ref 8.7–10.2)
Chloride: 104 mmol/L (ref 96–106)
Creatinine, Ser: 0.76 mg/dL (ref 0.76–1.27)
Globulin, Total: 2.7 g/dL (ref 1.5–4.5)
Glucose: 94 mg/dL (ref 70–99)
Potassium: 4.5 mmol/L (ref 3.5–5.2)
Sodium: 141 mmol/L (ref 134–144)
Total Protein: 6.2 g/dL (ref 6.0–8.5)
eGFR: 109 mL/min/{1.73_m2} (ref >59)

## 2024-04-13 LAB — CBC WITH DIFFERENTIAL/PLATELET
Basophils Absolute: 0.1 10*3/uL (ref 0.0–0.2)
Basos: 1 %
EOS (ABSOLUTE): 2.1 10*3/uL — ABNORMAL HIGH (ref 0.0–0.4)
Eos: 26 %
Hematocrit: 42.2 % (ref 37.5–51.0)
Hemoglobin: 13.4 g/dL (ref 13.0–17.7)
Immature Grans (Abs): 0 10*3/uL (ref 0.0–0.1)
Immature Granulocytes: 0 %
Lymphocytes Absolute: 2.4 10*3/uL (ref 0.7–3.1)
Lymphs: 29 %
MCH: 29.1 pg (ref 26.6–33.0)
MCHC: 31.8 g/dL (ref 31.5–35.7)
MCV: 92 fL (ref 79–97)
Monocytes Absolute: 0.8 10*3/uL (ref 0.1–0.9)
Monocytes: 10 %
Neutrophils Absolute: 2.7 10*3/uL (ref 1.4–7.0)
Neutrophils: 34 %
Platelets: 231 10*3/uL (ref 150–450)
RBC: 4.6 x10E6/uL (ref 4.14–5.80)
RDW: 13.7 % (ref 11.6–15.4)
WBC: 8 10*3/uL (ref 3.4–10.8)

## 2024-04-16 ENCOUNTER — Encounter: Payer: Self-pay | Admitting: Dermatology

## 2024-04-16 ENCOUNTER — Ambulatory Visit: Admitting: Dermatology

## 2024-04-16 VITALS — BP 111/73

## 2024-04-16 DIAGNOSIS — L309 Dermatitis, unspecified: Secondary | ICD-10-CM

## 2024-04-16 DIAGNOSIS — L299 Pruritus, unspecified: Secondary | ICD-10-CM | POA: Diagnosis not present

## 2024-04-16 DIAGNOSIS — L539 Erythematous condition, unspecified: Secondary | ICD-10-CM | POA: Diagnosis not present

## 2024-04-16 DIAGNOSIS — L308 Other specified dermatitis: Secondary | ICD-10-CM

## 2024-04-16 DIAGNOSIS — R21 Rash and other nonspecific skin eruption: Secondary | ICD-10-CM

## 2024-04-16 MED ORDER — HYDROXYZINE HCL 25 MG PO TABS: ORAL_TABLET | ORAL | 5 refills | Status: DC

## 2024-04-16 NOTE — Patient Instructions (Addendum)
 Date: Tue Apr 16 2024  Hello Joseph Fitzgerald ,  Thank you for visiting today. Here is a summary of the key instructions:  - Procedures:   - Two punch biopsies were performed today   - Return in 2 weeks for suture removal  - Medications:   - Continue using Triamcinolone  creams   - Continue receiving injections as scheduled we will discuss changing to a pill called Rinvoq after reviewing the biopsy results  - Follow-up:   - Return in 2 weeks for suture removal and to discuss biopsy results   - After biopsy results, we may switch to a pill medication called Rinvoq  - Instructions:   - Bring the name of the cream prescribed by your doctor to the next appointment   - Continue current treatment plan until next appointment  Please reach out if you have any questions or concerns.  Warm regards,  Dr. Louana Roup, Dermatology Patient Handout: Wound Care for Skin Biopsy Site  Taking Care of Your Skin Biopsy Site  Proper care of the biopsy site is essential for promoting healing and minimizing scarring. This handout provides instructions on how to care for your biopsy site to ensure optimal recovery.  1. Cleaning the Wound:  Clean the biopsy site daily with gentle soap and water . Gently pat the area dry with a clean, soft towel. Avoid harsh scrubbing or rubbing the area, as this can irritate the skin and delay healing.  2. Applying Aquaphor and Bandage:  After cleaning the wound, apply a thin layer of Aquaphor ointment to the biopsy site. Cover the area with a sterile bandage to protect it from dirt, bacteria, and friction. Change the bandage daily or as needed if it becomes soiled or wet.  3. Continued Care for One Week:  Repeat the cleaning, Aquaphor application, and bandaging process daily for one week following the biopsy procedure. Keeping the wound clean and moist during this initial healing period will help prevent infection and promote optimal healing.  4. Massaging Aquaphor  into the Area:  ---After one week, discontinue the use of bandages but continue to apply Aquaphor to the biopsy site. ----Gently massage the Aquaphor into the area using circular motions. ---Massaging the skin helps to promote circulation and prevent the formation of scar tissue.   Additional Tips:  Avoid exposing the biopsy site to direct sunlight during the healing process, as this can cause hyperpigmentation or worsen scarring. If you experience any signs of infection, such as increased redness, swelling, warmth, or drainage from the wound, contact your healthcare provider immediately. Follow any additional instructions provided by your healthcare provider for caring for the biopsy site and managing any discomfort. Conclusion:  Taking proper care of your skin biopsy site is crucial for ensuring optimal healing and minimizing scarring. By following these instructions for cleaning, applying Aquaphor, and massaging the area, you can promote a smooth and successful recovery. If you have any questions or concerns about caring for your biopsy site, don't hesitate to contact your healthcare provider for guidance.     Important Information  Due to recent changes in healthcare laws, you may see results of your pathology and/or laboratory studies on MyChart before the doctors have had a chance to review them. We understand that in some cases there may be results that are confusing or concerning to you. Please understand that not all results are received at the same time and often the doctors may need to interpret multiple results in order to provide you with  the best plan of care or course of treatment. Therefore, we ask that you please give us  2 business days to thoroughly review all your results before contacting the office for clarification. Should we see a critical lab result, you will be contacted sooner.   If You Need Anything After Your Visit  If you have any questions or concerns for your  doctor, please call our main line at [phone] If no one answers, please leave a voicemail as directed and we will return your call as soon as possible. Messages left after 4 pm will be answered the following business day.   You may also send us  a message via MyChart. We typically respond to MyChart messages within 1-2 business days.  For prescription refills, please ask your pharmacy to contact our office. Our fax number is [phone].  If you have an urgent issue when the clinic is closed that cannot wait until the next business day, you can page your doctor at the number below.    Please note that while we do our best to be available for urgent issues outside of office hours, we are not available 24/7.   If you have an urgent issue and are unable to reach us , you may choose to seek medical care at your doctor's office, retail clinic, urgent care center, or emergency room.  If you have a medical emergency, please immediately call 911 or go to the emergency department. In the event of inclement weather, please call our main line at [phone] for an update on the status of any delays or closures.  Dermatology Medication Tips: Please keep the boxes that topical medications come in in order to help keep track of the instructions about where and how to use these. Pharmacies typically print the medication instructions only on the boxes and not directly on the medication tubes.   If your medication is too expensive, please contact our office at [phone] or send us  a message through MyChart.   We are unable to tell what your co-pay for medications will be in advance as this is different depending on your insurance coverage. However, we may be able to find a substitute medication at lower cost or fill out paperwork to get insurance to cover a needed medication.   If a prior authorization is required to get your medication covered by your insurance company, please allow us  1-2 business days  to complete this process.  Drug prices often vary depending on where the prescription is filled and some pharmacies may offer cheaper prices.  The website www.goodrx.com contains coupons for medications through different pharmacies. The prices here do not account for what the cost may be with help from insurance (it may be cheaper with your insurance), but the website can give you the price if you did not use any insurance.  - You can print the associated coupon and take it with your prescription to the pharmacy.  - You may also stop by our office during regular business hours and pick up a GoodRx coupon card.  - If you need your prescription sent electronically to a different pharmacy, notify our office through [facility] or by phone at [phone]

## 2024-04-16 NOTE — Progress Notes (Signed)
   Follow-Up Visit   Subjective  Joseph Fitzgerald is a 53 y.o. male who presents for the following: Atopic Derm  Patient present today for follow up visit. Patient was last evaluated on 12/20/23. Pt has been taking Ebglyss since February 2025. Pt currently on manufacturer bridge program that will end on 04/03/2025. Patient reports sxs are better but still having itching that he rates as an 8 out of 10. Pt stated that he is injecting Ebglyss every 2 weeks and does not need to apply topicals in between.  Patient denies medication changes.  The following portions of the chart were reviewed this encounter and updated as appropriate: medications, allergies, medical history  Review of Systems:  No other skin or systemic complaints except as noted in HPI or Assessment and Plan.  Objective  Well appearing patient in no apparent distress; mood and affect are within normal limits.   A focused examination was performed of the following areas: scattered   Relevant exam findings are noted in the Assessment and Plan.                        Left Lower Back, Left Upper Arm - Posterior   Assessment & Plan   1. Persistent pruritic eczematous dermatitis with erythroderma  - Assessment: Patient has been undergoing treatment for a severe skin eczema for at least 4 months, including Ebglyss injections (last one on May 14th) and topical creams. The condition has not improved as expected, raising concerns about the diagnosis. Previous biopsy suggested eczema, but the lack of improvement warrants further investigation. Differential diagnoses include cutaneous T-cell lymphoma (CTCL), erythrodermic psoriasis, and severe atopic dermatitis. Previous pathology report noted no significant atypia in the lymphocytic population and a small number of lymphocytes extending to the overlying epidermis, but these changes were not diagnostic of a cutaneous lymphocytic disorder. CD3 highlighted the majority of lymphocytes with  a slight increase in CD4   - Plan:    Perform two additional punch biopsies (one from the arm, another location not specified)    Await biopsy results before changing treatment    Consider switching to Rinvoq (oral medication) if biopsy results continue to show eczema without evidence of lymphoma    Continue current topical cream treatment (specific cream to be identified)    Patient to return in 2 weeks for suture removal and discussion of biopsy results  RASH Left Lower Back, Left Upper Arm - Posterior Skin / nail biopsy - Left Lower Back, Left Upper Arm - Posterior Type of biopsy: punch   Informed consent: discussed and consent obtained   Timeout: patient name, date of birth, surgical site, and procedure verified   Procedure prep:  Patient was prepped and draped in usual sterile fashion Prep type:  Isopropyl alcohol Anesthesia: the lesion was anesthetized in a standard fashion   Anesthetic:  1% lidocaine w/ epinephrine 1-100,000 buffered w/ 8.4% NaHCO3 Punch size:  4 mm Suture size:  4-0 Suture type: Prolene (polypropylene)   Hemostasis achieved with: suture and aluminum chloride   Outcome: patient tolerated procedure well   Post-procedure details: sterile dressing applied and wound care instructions given   Dressing type: petrolatum gauze    No follow-ups on file.    Documentation: I have reviewed the above documentation for accuracy and completeness, and I agree with the above.  I, Shirron Louanne Roussel, CMA, am acting as scribe for [employer], DO.   Louana Roup, DO

## 2024-04-19 LAB — SURGICAL PATHOLOGY

## 2024-04-29 ENCOUNTER — Ambulatory Visit: Payer: Self-pay | Admitting: Dermatology

## 2024-04-29 NOTE — Progress Notes (Signed)
 Bx results ruled out CTCL and listed eczema vs id reaction.  Pt currently failed Ebglyss so I'm also considering possible drug reaction vs severe eczema with failure of Ebglyss.  I will discuss results and next steps in details with pt at his [facility] visit this week.  -Dr Myrtie Atkinson

## 2024-05-01 ENCOUNTER — Ambulatory Visit (INDEPENDENT_AMBULATORY_CARE_PROVIDER_SITE_OTHER): Admitting: Dermatology

## 2024-05-01 ENCOUNTER — Encounter: Payer: Self-pay | Admitting: Dermatology

## 2024-05-01 VITALS — BP 97/70 | HR 97

## 2024-05-01 DIAGNOSIS — I10 Essential (primary) hypertension: Secondary | ICD-10-CM | POA: Diagnosis not present

## 2024-05-01 DIAGNOSIS — L299 Pruritus, unspecified: Secondary | ICD-10-CM | POA: Diagnosis not present

## 2024-05-01 DIAGNOSIS — R21 Rash and other nonspecific skin eruption: Secondary | ICD-10-CM

## 2024-05-01 DIAGNOSIS — Z111 Encounter for screening for respiratory tuberculosis: Secondary | ICD-10-CM

## 2024-05-01 DIAGNOSIS — L2089 Other atopic dermatitis: Secondary | ICD-10-CM

## 2024-05-01 MED ORDER — PREDNISONE 10 MG PO TABS: ORAL_TABLET | ORAL | 0 refills | Status: AC

## 2024-05-01 NOTE — Progress Notes (Addendum)
 Follow-Up Visit   Subjective  Joseph Fitzgerald is a 53 y.o. male who presents for the following: suture removal and go over biopsy results   Joseph Fitzgerald presents for a follow-up visit status post-biopsy and suture removal. The patient has a history of severe atopic dermatitis and has been experiencing an erythrodermic pruritic scaly rash covering 90% of his body.  Joseph Fitzgerald initially had a biopsy about 5 months ago that revealed severe atopic dermatitis, for which he was started on Ebglyss  injections. However, he experienced little to no relief with this treatment. Two weeks ago, due to persistent symptoms, two separate punch biopsies were performed to rule out potential erythrodermic CTCL, erythrodermic psoriasis, or confirm again severe atopic dermatitis. The recent biopsy results showed  severe spongiotic dermatitis with eosinophils, with the pathologist's differential including drug reaction or severe atopic dermatitis.  Upon reviewing the patient's medication list, it was noted that he has been taking amlodipine  for about a year and a half, which is the only oral medication he is currently on. Given the timeline of when the rash started in relation to the initiation of amlodipine , a possible drug reaction to amlodipine  is now suspected.  The patient's blood pressure has been stable. He has not reported any symptoms such as headache, dizziness, or blurred vision that might be associated with blood pressure changes.   Pathology showed drug reaction vs severe eczema.  The following portions of the chart were reviewed this encounter and updated as appropriate: medications, allergies, medical history  Review of Systems:  No other skin or systemic complaints except as noted in HPI or Assessment and Plan.  Objective  Well appearing patient in no apparent distress; mood and affect are within normal limits.  A full examination was performed including scalp, head, eyes, ears, nose, lips, neck,  chest, axillae, abdomen, back, buttocks, bilateral upper extremities, bilateral lower extremities, hands, feet, fingers, toes, fingernails, and toenails. All findings within normal limits unless otherwise noted below.   REPORT OF DERMATOPATHOLOGY FINAL DIAGNOSIS and MICROSCOPIC DESCRIPTION Diagnosis 1. Skin , left lower back PSORIASIFORM SPONGIOTIC DERMATITIS, SEE DESCRIPTION 2. Skin , left upper arm - posterior PSORIASIFORM SPONGIOTIC DERMATITIS, SEE DESCRIPTION Microscopic Description 1. , 2. There is psoriasiform hyperplasia of the epidermis with foci of slight spongiosis and parakeratosis. An infiltrate composed of lymphocytes and scattered eosinophils is present around the superficial vascular plexus. Following review of the hematoxylin and eosin sections, a PAS stain was obtained to exclude a fungal infection. The PAS stain is negative for fungal organisms. The findings are most consistent with a subacute eczematous dermatitis such as contact, nummular or atopic dermatitis or an id reaction.  Relevant exam findings are noted in the Assessment and Plan.    Assessment & Plan    Encounter for Removal of Sutures - Incision site is clean, dry and intact. - Wound cleansed, sutures removed and wound cleansed   - Discussed pathology results showing drug reaction vs severe eczema - Scars remodel for a full year. - patient can apply over-the-counter silicone scar cream once to twice a day to help with scar remodeling if desired. - Patient advised to call with any concerns or if they notice any new or changing lesions.  1. Erythrodermic pruritic rash (Atopic Derm vs Drug Reaction) and Severe Pruritus - Assessment:  Patient initially diagnosed with severe atopic dermatitis 5 months ago and started on Ebglyss  injections with little to no relief. Recent punch biopsies confirmed severe spongiotic dermatitis with eosinophils.  Pathologist's differential included drug reaction versus severe  atopic dermatitis. Amlodipine , initiated approximately 1.5 years ago, is suspected as a possible cause of drug reaction given the timeline of rash onset.  - Plan:    Discontinue amlodipine     Initiate prednisone  taper:     - 40 mg daily for 5 days     - 30 mg daily for 5 days     - 20 mg daily for 5 days     - 10 mg daily for 5 days    Continue current topical treatments    Patient to monitor blood pressure at home    Order quantiferon and hepatitis panel    If no improvement or rebound occurs, consider initiating Cibinqo (JAK1 inhibitor)    Patient education:     - Purchase home blood pressure cuff     - Monitor for symptoms of headache, dizziness, or blurred vision     - Seek emergency care if these symptoms occur  Follow-up in 6 weeks for reassessment.  2. Hypertension - Assessment:  Patient's blood pressure has been stable on amlodipine . However, due to suspected drug reaction, amlodipine  will be discontinued.  - Plan:    Discontinue amlodipine     Patient to monitor blood pressure at home    Educate patient on signs and symptoms requiring emergency care (headache, dizziness, blurred vision)     Return in about 6 weeks (around 06/12/2024).  IBerwyn Lesches, Surg Tech III, am acting as scribe for [employer], DO.   Documentation: I have reviewed the above documentation for accuracy and completeness, and I agree with the above.  Delon Lenis, DO

## 2024-05-01 NOTE — Patient Instructions (Addendum)
 Date: Wed May 01 2024  Hello Fenech, Henery,  Thank you for visiting today. Here is a summary of the key instructions:  - Medications:   - Stop taking amlodipine    - Start prednisone  taper:     - 40 mg for 5 days     - 30 mg for 5 days     - 20 mg for 5 days     - 10 mg for 5 days  - Medical Devices:   - Buy a blood pressure cuff to check your blood pressure regularly  - Follow-up:   - Return in 6 weeks for a follow-up appointment  - Monitoring:   - Check your blood pressure regularly   - If you have headache, dizziness, or blurred vision:     - Check your blood pressure     - Go to the emergency room  - Skin Care:   - Continue using topical treatments while waiting for prednisone  to take effect  - Lab Tests:   - Complete quantifier and hepatitis panel tests before next appointment  We look forward to seeing you at your next visit. If you have any questions or concerns before then, please do not hesitate to contact our office.  Warm regards,  Dr. Louana Roup, Dermatology    Important Information  Due to recent changes in healthcare laws, you may see results of your pathology and/or laboratory studies on MyChart before the doctors have had a chance to review them. We understand that in some cases there may be results that are confusing or concerning to you. Please understand that not all results are received at the same time and often the doctors may need to interpret multiple results in order to provide you with the best plan of care or course of treatment. Therefore, we ask that you please give us  2 business days to thoroughly review all your results before contacting the office for clarification. Should we see a critical lab result, you will be contacted sooner.   If You Need Anything After Your Visit  If you have any questions or concerns for your doctor, please call our main line at [phone] If no one answers, please leave a voicemail as directed and we will  return your call as soon as possible. Messages left after 4 pm will be answered the following business day.   You may also send us  a message via MyChart. We typically respond to MyChart messages within 1-2 business days.  For prescription refills, please ask your pharmacy to contact our office. Our fax number is [phone].  If you have an urgent issue when the clinic is closed that cannot wait until the next business day, you can page your doctor at the number below.    Please note that while we do our best to be available for urgent issues outside of office hours, we are not available 24/7.   If you have an urgent issue and are unable to reach us , you may choose to seek medical care at your doctor's office, retail clinic, urgent care center, or emergency room.  If you have a medical emergency, please immediately call 911 or go to the emergency department. In the event of inclement weather, please call our main line at [phone] for an update on the status of any delays or closures.  Dermatology Medication Tips: Please keep the boxes that topical medications come in in order to help keep track of the instructions about where and how to use  these. Pharmacies typically print the medication instructions only on the boxes and not directly on the medication tubes.   If your medication is too expensive, please contact our office at [phone] or send us  a message through MyChart.   We are unable to tell what your co-pay for medications will be in advance as this is different depending on your insurance coverage. However, we may be able to find a substitute medication at lower cost or fill out paperwork to get insurance to cover a needed medication.   If a prior authorization is required to get your medication covered by your insurance company, please allow us  1-2 business days to complete this process.  Drug prices often vary depending on where the prescription is filled and some pharmacies  may offer cheaper prices.  The website www.goodrx.com contains coupons for medications through different pharmacies. The prices here do not account for what the cost may be with help from insurance (it may be cheaper with your insurance), but the website can give you the price if you did not use any insurance.  - You can print the associated coupon and take it with your prescription to the pharmacy.  - You may also stop by our office during regular business hours and pick up a GoodRx coupon card.  - If you need your prescription sent electronically to a different pharmacy, notify our office through [facility] or by phone at [phone]

## 2024-06-12 ENCOUNTER — Ambulatory Visit (INDEPENDENT_AMBULATORY_CARE_PROVIDER_SITE_OTHER): Admitting: Dermatology

## 2024-06-12 ENCOUNTER — Encounter: Payer: Self-pay | Admitting: Dermatology

## 2024-06-12 VITALS — BP 121/73

## 2024-06-12 DIAGNOSIS — L2089 Other atopic dermatitis: Secondary | ICD-10-CM | POA: Diagnosis not present

## 2024-06-12 DIAGNOSIS — L299 Pruritus, unspecified: Secondary | ICD-10-CM

## 2024-06-12 MED ORDER — HYDROXYZINE HCL 25 MG PO TABS: ORAL_TABLET | ORAL | 5 refills | Status: AC

## 2024-06-12 MED ORDER — ZORYVE 0.3 % EX CREA: TOPICAL_CREAM | CUTANEOUS | 5 refills | Status: AC

## 2024-06-12 NOTE — Patient Instructions (Signed)

## 2024-06-12 NOTE — Progress Notes (Unsigned)
 Follow-Up Visit   Subjective  Joseph Fitzgerald is a 54 y.o. male who presents for FOLLOW UP on the diagnoses listed below:  Patient was last evaluated on 05/01/24.   Joseph Fitzgerald, a patient with a history of eczema, presents for follow-up of persistent skin rash and itching. The patient has been diagnosed with eczema through four biopsies.  The patient reports ongoing symptoms of eczema, including visible redness and itching. The itching is particularly bothersome, affecting sleep. The patient has been using hydroxyzine , which has been helpful in managing the itch and improving sleep. They report taking a whole tablet at night, which effectively aids sleep, but may cause daytime drowsiness. The patient has also been prescribed prednisone , which they completed as directed, but it did not provide significant relief for the itching.  Regarding treatment adherence, the patient reports discontinuing the use of prescribed creams, stating they were not helpful. However, they have been compliant with oral medications, including the prednisone  course and hydroxyzine . The patient did not complete the lab work, including TB testing, that was ordered at the previous visit, which has delayed the initiation of a new medication (Cibinqo).  The patient denies any recent travel abroad or history of tuberculosis vaccination. They report being born in [place] and immigrating to the [place]  in 1998. No new medications or recent changes to existing medications are reported. The patient tried discontinuing amlodipine  (a blood pressure medication) to see if it was contributing to the rash, but the rash persisted.  The following portions of the chart were reviewed this encounter and updated as appropriate: medications, allergies, medical history  Review of Systems:  No other skin or systemic complaints except as noted in HPI or Assessment and Plan.  Objective  Well appearing patient in no apparent distress; mood  and affect are within normal limits.   A focused examination was performed of the following areas: scattered   Relevant exam findings are noted in the Assessment and Plan.    Assessment & Plan   ATOPIC DERMATITIS Exam: Scaly pink papules coalescing to plaques 80% BSA, IGA 4   flared   Atopic dermatitis (eczema) is a chronic, relapsing, pruritic condition that can significantly affect quality of life. It is often associated with allergic rhinitis and/or asthma and can require treatment with topical medications, phototherapy, or in severe cases biologic injectable medication (Dupixent; Adbry) or Oral JAK inhibitors.  - Assessment: Patient has a confirmed diagnosis of eczema based on four previous biopsies. Condition remains active despite recent treatment with prednisone , which did not provide significant relief. Hydroxyzine  has been helpful for managing nighttime itching and sleep. Topical treatments have not been effective, and the patient discontinued their use. Visual examination shows improvement compared to the initial presentation, but persistent redness is still noted. Previous treatment with Dupixent was ineffective. The patient's eczema appears to be refractory to conventional treatments, necessitating consideration of more potent systemic therapy.  - Plan:    Prescribe hydroxyzine :     - 1 tablet twice daily as needed for itching     - Take a whole tablet at night, consider half a tablet during the day if drowsiness occurs     - Dispense 60 tablets with refills    Prescribe Zoryve  cream (topical roflumilast ):     - To be sent to [facility], pending insurance approval     - Patient education: Emphasize the importance of consistent application of topical treatments    Order QuantiFERON-TB Gold test to screen for tuberculosis  prior to initiating Cibinqo (abrocitinib)    If TB test is negative:     - Initiate Cibinqo (abrocitinib) samples for 2 months, once daily dosing      - Complete necessary paperwork for Cibinqo prescription     - Provide patient education on Cibinqo: mechanism of action, expected benefits, and potential side effects   Return in 3 months (on 09/12/2024) for atopic derm.   Documentation: I have reviewed the above documentation for accuracy and completeness, and I agree with the above.  I, Shirron Maranda, CMA, am acting as scribe for [employer], DO.   Delon Lenis, DO

## 2024-06-17 LAB — QUANTIFERON-TB GOLD PLUS
QuantiFERON Mitogen Value: 0.18 [IU]/mL
QuantiFERON Nil Value: 0.02 [IU]/mL
QuantiFERON TB1 Ag Value: 0.03 [IU]/mL
QuantiFERON TB2 Ag Value: 0.02 [IU]/mL
QuantiFERON-TB Gold Plus: UNDETERMINED — AB

## 2024-06-18 ENCOUNTER — Ambulatory Visit: Payer: Self-pay | Admitting: Dermatology

## 2024-06-18 NOTE — Addendum Note (Signed)
 Addended by: Neema Barreira U on: 06/18/2024 09:39 AM   Modules accepted: Orders

## 2024-06-18 NOTE — Progress Notes (Signed)
 Hi Joseph Fitzgerald,  Pt will need a chest xray to rule out active TB before starting Cininqo.  Please call pt and send order for chest xray

## 2024-07-18 ENCOUNTER — Telehealth: Payer: Self-pay | Admitting: Internal Medicine

## 2024-07-18 NOTE — Telephone Encounter (Signed)
 Confirmed appt for 8/22

## 2024-07-19 ENCOUNTER — Ambulatory Visit: Attending: Internal Medicine | Admitting: Internal Medicine

## 2024-07-19 ENCOUNTER — Encounter: Payer: Self-pay | Admitting: Internal Medicine

## 2024-07-19 VITALS — BP 121/82 | HR 76 | Temp 98.2°F | Ht 62.0 in | Wt 147.0 lb

## 2024-07-19 DIAGNOSIS — R634 Abnormal weight loss: Secondary | ICD-10-CM

## 2024-07-19 DIAGNOSIS — L209 Atopic dermatitis, unspecified: Secondary | ICD-10-CM | POA: Diagnosis not present

## 2024-07-19 DIAGNOSIS — Z7951 Long term (current) use of inhaled steroids: Secondary | ICD-10-CM

## 2024-07-19 DIAGNOSIS — J4540 Moderate persistent asthma, uncomplicated: Secondary | ICD-10-CM | POA: Diagnosis not present

## 2024-07-19 DIAGNOSIS — Z87891 Personal history of nicotine dependence: Secondary | ICD-10-CM

## 2024-07-19 DIAGNOSIS — Z1211 Encounter for screening for malignant neoplasm of colon: Secondary | ICD-10-CM

## 2024-07-19 DIAGNOSIS — I10 Essential (primary) hypertension: Secondary | ICD-10-CM

## 2024-07-19 MED ORDER — ALBUTEROL SULFATE HFA 108 (90 BASE) MCG/ACT IN AERS: 2.0000 | INHALATION_SPRAY | RESPIRATORY_TRACT | 6 refills | Status: AC | PRN

## 2024-07-19 NOTE — Patient Instructions (Signed)
  VISIT SUMMARY: Today, you came in for a follow-up on your blood pressure and asthma management. We also discussed your atopic dermatitis, recent weight loss, and upcoming travel plans.  YOUR PLAN: -ATOPIC DERMATITIS (ECZEMA): Atopic dermatitis is a chronic skin condition that causes itchy and inflamed skin. You should continue using hydroxyzine  as needed for itching and apply Roflumilast  cream as directed. Make sure you have enough medication before your trip to [place].  -UNINTENTIONAL WEIGHT LOSS AND NIGHT SWEATS: You have lost 15 pounds since January and have been experiencing night sweats. We will conduct baseline blood tests and a Quantiferon Gold test to investigate the cause.  -ESSENTIAL HYPERTENSION, CONTROLLED: Your blood pressure is well-controlled without the use of amlodipine , with recent readings within the target range. Consider getting a home blood pressure monitoring device to keep track of your blood pressure regularly.  -ASTHMA, WELL-MANAGED: Your asthma is well-managed with no recent flare-ups. Continue using your Breo inhaler and we will refill your albuterol  inhaler.  INSTRUCTIONS: Please follow up with your dermatologist before your trip to [place] to discuss your medication needs. Additionally, consider purchasing a home blood pressure monitoring device to keep track of your blood pressure regularly. We will contact you with the results of your blood tests and Quantiferon Gold test once they are available.                      Contains text generated by [employer].                                 Contains text generated by [employer].

## 2024-07-19 NOTE — Progress Notes (Signed)
 Patient ID: Joseph Fitzgerald, male    DOB: November 21, 1971  MRN: [id number]  CC: Follow-up (Follow-up. Med refill. [place] out of country in Sept. /Yes to shingles vax)   Subjective: Joseph Fitzgerald is a 53 y.o. male who presents for chronic ds management. Wife is with him. His concerns today include:  Pt with HTN, HL, mod persistent asthma, obesity, environmental allergies and eczema, loculated pleural effusion left side requiring fibrinolytic therapy through chest tube 12/2020, aortic atherosclerosis on chest CT 11/2020   Discussed the use of AI scribe software for clinical note transcription with the patient, who gave verbal consent to proceed.  History of Present Illness   Joseph Fitzgerald is a 53 year old male with hypertension and asthma who presents for follow-up on his blood pressure and asthma management. He is accompanied by his wife.  HTN: He stopped taking amlodipine  about three months ago on the advice of his dermatologist. I do not see documentation of this. Recent blood pressure measurements have been 121/82 today, 121/73 in July, 97/70 in June, and 111/73 in May. He does not monitor his blood pressure at home and does not own a device for this purpose.  Regarding his asthma, there have been no recent flare-ups, even with the hot weather. He continues to use his Breo inhaler and requests a refill for his albuterol  inhaler.  He has a history of severe atopic dermatitis and is being treated by derm Dr. Alm. There has been improvement with the use of hydroxyzine  twice a day as needed for itching and a cream called Roflumilast . However, he continues to experience significant itching and skin darkening, which began about two weeks ago. He did not receive samples of another medication that was intended for him. He is planning to travel to [place] in September for three months. He has not yet informed his dermatologist about this trip. He plans to discuss his medication needs before  leaving.  Wife is concerned about his wgh loss. He has experienced a weight loss of 15 pounds since January, dropping from 162 pounds to 147 pounds. He attributes this to not eating as much, although he claims to eat three meals a day. No symptoms of depression, fever, swallowing difficulties, stomach pain, or blood in stools or urine. He endorses some night sweats      Patient Active Problem List   Diagnosis Date Noted   Necrotizing pneumonia (HCC) 09/02/2021   Chest tube in place    COVID-19 virus infection 12/31/2020   Loculated pleural effusion 12/31/2020   Asthma    Poor dentition    Aortic atherosclerosis (HCC)    Mass of lower lobe of left lung    Marijuana abuse    Tobacco abuse    Dental caries 12/07/2020   Hypokalemia 03/17/2018   Hyperglycemia 03/17/2018   Asthma, moderate persistent 08/17/2016   Vitamin D  deficiency 09/01/2015   Family history of diabetes mellitus in brother 08/31/2015   Eczema 08/31/2015   Environmental allergies 04/16/2014     Current Outpatient Medications on File Prior to Visit  Medication Sig Dispense Refill   albuterol  (VENTOLIN  HFA) 108 (90 Base) MCG/ACT inhaler INHALE 2 PUFFS INTO THE LUNGS EVERY 4 HOURS AS NEEDED FOR WHEEZING OR SHORTNESS OF BREATH 18 g 2   amLODipine  (NORVASC ) 5 MG tablet Take 1 tablet (5 mg total) by mouth daily. 90 tablet 1   clindamycin  (CLEOCIN ) 300 MG capsule Take 1 capsule (300 mg total) by mouth 3 (three) times daily. 21 capsule  0   clobetasol  cream (TEMOVATE ) 0.05 % Apply 1 Application topically 2 (two) times daily. Mix with CeraVe Anti-itch. Apply to affected areas twice a day. Do not apply to face 60 g 0   fluticasone  (FLONASE ) 50 MCG/ACT nasal spray 1 spray each nostril daily x 1 week then PRN for allergy symptoms 16 g 6   fluticasone  furoate-vilanterol (BREO ELLIPTA ) 200-25 MCG/ACT AEPB Inhale 1 puff into the lungs daily. 60 each 11   hydrOXYzine  (ATARAX ) 25 MG tablet May take a half tablet in the morning for itch  PRN. 30 tablet 5   ipratropium-albuterol  (DUONEB) 0.5-2.5 (3) MG/3ML SOLN Take 3 mLs by nebulization every 6 (six) hours. 360 mL 1   Lebrikizumab-lbkz  (EBGLYSS ) 250 MG/2ML SOSY Inject 4 mLs into the skin every 14 (fourteen) days. 4 mL 3   Lebrikizumab-lbkz  (EBGLYSS ) 250 MG/2ML SOSY Inject 2 mLs into the skin every 30 (thirty) days. 2 mL 7   loratadine  (CLARITIN ) 10 MG tablet Take 1 tablet (10 mg total) by mouth daily. 100 tablet 1   olopatadine  (PATANOL) 0.1 % ophthalmic solution Place 1 drop into both eyes 2 (two) times daily. 5 mL 0   Roflumilast  (ZORYVE ) 0.3 % CREA Apply to affected areas daily 60 g 5   No current facility-administered medications on file prior to visit.    Allergies  Allergen Reactions   Penicillins Rash    Did it involve swelling of the face/tongue/throat, SOB, or low BP?No Did it involve sudden or severe rash/hives, skin peeling, or any reaction on the inside of your mouth or nose?Yes Did you need to seek medical attention at a hospital or doctor's office? no When did it last happen? Child If all above answers are "NO", may proceed with cephalosporin use.    Social History   Socioeconomic History   Marital status: Single    Spouse name: Not on file   Number of children: 3   Years of education: 12   Highest education level: Not on file  Occupational History   Occupation: Cook   Tobacco Use   Smoking status: Never   Smokeless tobacco: Former    Types: Associate Professor status: Never Used  Substance and Sexual Activity   Alcohol use: Not Currently    Comment: beer   Drug use: Not Currently    Types: Marijuana   Sexual activity: Yes  Other Topics Concern   Not on file  Social History Narrative   Not on file   Social Drivers of Health   Financial Resource Strain: Low Risk  (12/14/2023)   Overall Financial Resource Strain (CARDIA)    Difficulty of Paying Living Expenses: Not very hard  Food Insecurity: No Food Insecurity (12/14/2023)    Hunger Vital Sign    Worried About Running Out of Food in the Last Year: Never true    Ran Out of Food in the Last Year: Never true  Transportation Needs: No Transportation Needs (12/14/2023)   PRAPARE - Administrator, Civil Service (Medical): No    Lack of Transportation (Non-Medical): No  Physical Activity: Inactive (12/14/2023)   Exercise Vital Sign    Days of Exercise per Week: 0 days    Minutes of Exercise per Session: 0 min  Stress: No Stress Concern Present (12/14/2023)   [employer] of Occupational Health - Occupational Stress Questionnaire    Feeling of Stress : Only a little  Social Connections: Moderately Isolated (12/14/2023)   Social Connection and  Isolation Panel    Frequency of Communication with Friends and Family: Never    Frequency of Social Gatherings with Friends and Family: Once a week    Attends Religious Services: More than 4 times per year    Active Member of [employer] or Organizations: No    Attends Banker Meetings: Never    Marital Status: Married  Catering manager Violence: Not At Risk (12/14/2023)   Humiliation, Afraid, Rape, and Kick questionnaire    Fear of Current or Ex-Partner: No    Emotionally Abused: No    Physically Abused: No    Sexually Abused: No    Family History  Problem Relation Age of Onset   Asthma Brother    Diabetes Brother     Past Surgical History:  Procedure Laterality Date   NO PAST SURGERIES      ROS: Review of Systems Negative except as stated above  PHYSICAL EXAM: BP 121/82 (BP Location: Left Arm, Patient Position: Sitting, Cuff Size: Normal)   Pulse 76   Temp 98.2 F (36.8 C) (Oral)   Ht 5' 2 (1.575 m)   Wt 147 lb (66.7 kg)   SpO2 100%   BMI 26.89 kg/m   Wt Readings from Last 3 Encounters:  07/19/24 147 lb (66.7 kg)  04/12/24 168 lb (76.2 kg)  12/14/23 162 lb (73.5 kg)    Physical Exam   General appearance - alert, well appearing, and in no distress Mental status - normal  mood, behavior, speech, dress, motor activity, and thought processes Neck - supple, no significant adenopathy Chest - clear to auscultation, no wheezes, rales or rhonchi, symmetric air entry Heart - normal rate, regular rhythm, normal S1, S2, no murmurs, rubs, clicks or gallops Abdomen: Soft and nontender.  No organomegaly and no abnormal masses. LN: No cervical or axillary lymphadenopathy. Extremities -non-pitting edema of both lower extremities. Skin - see pics below      Latest Ref Rng & Units 04/12/2024    2:26 PM 09/12/2023    2:50 PM 12/06/2022    3:27 PM  CMP  Glucose 70 - 99 mg/dL 94  99  99   BUN 6 - 24 mg/dL 8  12  12    Creatinine 0.76 - 1.27 mg/dL 9.23  9.21  9.04   Sodium 134 - 144 mmol/L 141  139  141   Potassium 3.5 - 5.2 mmol/L 4.5  4.4  4.3   Chloride 96 - 106 mmol/L 104  99  105   CO2 20 - 29 mmol/L 25  20  23    Calcium  8.7 - 10.2 mg/dL 8.8  9.7  9.3   Total Protein 6.0 - 8.5 g/dL 6.2   7.3   Total Bilirubin 0.0 - 1.2 mg/dL 0.2   0.2   Alkaline Phos 44 - 121 IU/L 87   113   AST 0 - 40 IU/L 33   20   ALT 0 - 44 IU/L 19   15    Lipid Panel     Component Value Date/Time   CHOL 253 (H) 12/06/2022 1527   TRIG 168 (H) 12/06/2022 1527   HDL 53 12/06/2022 1527   CHOLHDL 4.8 12/06/2022 1527   CHOLHDL 3.8 12/31/2020 0906   VLDL 6 12/31/2020 0906   LDLCALC 169 (H) 12/06/2022 1527    CBC    Component Value Date/Time   WBC 8.0 04/12/2024 1426   WBC 8.2 01/07/2021 0756   RBC 4.60 04/12/2024 1426   RBC 4.41 01/07/2021  0756   HGB 13.4 04/12/2024 1426   HCT 42.2 04/12/2024 1426   PLT 231 04/12/2024 1426   MCV 92 04/12/2024 1426   MCH 29.1 04/12/2024 1426   MCH 30.4 01/07/2021 0756   MCHC 31.8 04/12/2024 1426   MCHC 34.3 01/07/2021 0756   RDW 13.7 04/12/2024 1426   LYMPHSABS 2.4 04/12/2024 1426   MONOABS 1.3 (H) 01/05/2021 0052   EOSABS 2.1 (H) 04/12/2024 1426   BASOSABS 0.1 04/12/2024 1426    ASSESSMENT AND PLAN: 1. Moderate persistent asthma without  complication (Primary) Stable Continue Breo and albuterol  inhalers.  Reminded him to take some with him when he travels outside of the [place] . - albuterol  (VENTOLIN  HFA) 108 (90 Base) MCG/ACT inhaler; Inhale 2 puffs into the lungs every 4 (four) hours as needed for wheezing or shortness of breath.  Dispense: 18 g; Refill: 6  2. Essential hypertension Patient's blood pressure has been good off amlodipine .  Continue to monitor off medication.  3. Unexplained weight loss Will check some baseline test today.  Due for colon cancer screening.  Agreeable to doing Cologuard again. - TSH+T4F+T3Free - CBC with Differential/Platelet - Comprehensive metabolic panel with GFR  4. Screening for colon cancer - Cologuard  5.  Atopic dermatitis Most severe case that I have seen.  Reminded him to touch base with his dermatologist before traveling outside of the [place]  to make sure he has enough medication to take with him.    Patient was given the opportunity to ask questions.  Patient verbalized understanding of the plan and was able to repeat key elements of the plan.   This documentation was completed using Paediatric nurse.  Any transcriptional errors are unintentional.  No orders of the defined types were placed in this encounter.    Requested Prescriptions   Pending Prescriptions Disp Refills   albuterol  (VENTOLIN  HFA) 108 (90 Base) MCG/ACT inhaler 18 g 2    Sig: Inhale 2 puffs into the lungs every 4 (four) hours as needed for wheezing or shortness of breath.    No follow-ups on file.  Barnie Louder, MD, FACP

## 2024-07-20 ENCOUNTER — Ambulatory Visit: Payer: Self-pay | Admitting: Internal Medicine

## 2024-07-20 LAB — COMPREHENSIVE METABOLIC PANEL WITH GFR
ALT: 20 IU/L (ref 0–44)
AST: 24 IU/L (ref 0–40)
Albumin: 3.7 g/dL — ABNORMAL LOW (ref 4.1–5.1)
Alkaline Phosphatase: 127 IU/L — ABNORMAL HIGH (ref 44–121)
BUN/Creatinine Ratio: 20 (ref 9–20)
BUN: 15 mg/dL (ref 6–24)
Bilirubin Total: 0.4 mg/dL (ref 0.0–1.2)
CO2: 20 mmol/L (ref 20–29)
Calcium: 9.4 mg/dL (ref 8.7–10.2)
Chloride: 102 mmol/L (ref 96–106)
Creatinine, Ser: 0.74 mg/dL — ABNORMAL LOW (ref 0.76–1.27)
Globulin, Total: 5.6 g/dL — ABNORMAL HIGH (ref 1.5–4.5)
Glucose: 95 mg/dL (ref 70–99)
Potassium: 4.7 mmol/L (ref 3.5–5.2)
Sodium: 138 mmol/L (ref 134–144)
Total Protein: 9.3 g/dL — ABNORMAL HIGH (ref 6.0–8.5)
eGFR: 110 mL/min/1.73 (ref >59)

## 2024-07-20 LAB — CBC WITH DIFFERENTIAL/PLATELET
Basophils Absolute: 0.1 x10E3/uL (ref 0.0–0.2)
Basos: 1 %
EOS (ABSOLUTE): 0.9 x10E3/uL — ABNORMAL HIGH (ref 0.0–0.4)
Eos: 8 %
Hematocrit: 42.9 % (ref 37.5–51.0)
Hemoglobin: 13.4 g/dL (ref 13.0–17.7)
Immature Grans (Abs): 0 x10E3/uL (ref 0.0–0.1)
Immature Granulocytes: 0 %
Lymphocytes Absolute: 1.7 x10E3/uL (ref 0.7–3.1)
Lymphs: 16 %
MCH: 28.5 pg (ref 26.6–33.0)
MCHC: 31.2 g/dL — ABNORMAL LOW (ref 31.5–35.7)
MCV: 91 fL (ref 79–97)
Monocytes Absolute: 0.5 x10E3/uL (ref 0.1–0.9)
Monocytes: 5 %
Neutrophils Absolute: 7 x10E3/uL (ref 1.4–7.0)
Neutrophils: 70 %
Platelets: 313 x10E3/uL (ref 150–450)
RBC: 4.71 x10E6/uL (ref 4.14–5.80)
RDW: 13.3 % (ref 11.6–15.4)
WBC: 10.1 x10E3/uL (ref 3.4–10.8)

## 2024-07-20 LAB — TSH+T4F+T3FREE
Free T4: 0.88 ng/dL (ref 0.82–1.77)
T3, Free: 3.3 pg/mL (ref 2.0–4.4)
TSH: 1.08 u[IU]/mL (ref 0.450–4.500)

## 2024-08-13 ENCOUNTER — Other Ambulatory Visit (HOSPITAL_COMMUNITY): Payer: Self-pay

## 2024-08-13 MED ORDER — FLUZONE 0.5 ML IM SUSY
PREFILLED_SYRINGE | INTRAMUSCULAR | 0 refills | Status: DC
  Filled 2024-08-13: qty 0.3, 1d supply, fill #0

## 2024-09-16 ENCOUNTER — Ambulatory Visit: Admitting: Dermatology

## 2024-09-19 ENCOUNTER — Emergency Department (HOSPITAL_COMMUNITY): Admission: EM | Admit: 2024-09-19 | Discharge: 2024-09-19 | Disposition: A | Discharge status: Home / Self Care

## 2024-09-19 ENCOUNTER — Other Ambulatory Visit: Payer: Self-pay

## 2024-09-19 ENCOUNTER — Encounter (HOSPITAL_COMMUNITY): Payer: Self-pay

## 2024-09-19 DIAGNOSIS — L309 Dermatitis, unspecified: Secondary | ICD-10-CM

## 2024-09-19 MED ORDER — PREDNISONE 10 MG (21) PO TBPK
ORAL_TABLET | Freq: Every day | ORAL | 0 refills | Status: DC
Start: 2024-09-19 — End: 2024-09-19

## 2024-09-19 MED ORDER — TRIAMCINOLONE ACETONIDE 0.1 % EX CREA
1.0000 | TOPICAL_CREAM | Freq: Two times a day (BID) | CUTANEOUS | 0 refills | Status: DC
Start: 2024-09-19 — End: 2024-09-24

## 2024-09-19 NOTE — ED Provider Notes (Deleted)
 [place] EMERGENCY DEPARTMENT AT [facility] Provider Note   CSN: [id number] Arrival date & time: 09/19/24  1314     Patient presents with: Skin Issue   Joseph Fitzgerald is a 53 y.o. male who presents emergency department for evaluation of full-body rash.  Patient states he was diagnosed with eczema about 1 year ago and was previously treated with eclipse.  However, per patient he was not responsive to that medicine.  Patient states his eczema is typically managed by his PCP.  He reports to the emergency department today because the rash has gotten worse over the last week and he states it is exceptionally itchy and painful.  He states he occasionally puts Aquaphor on the rash but does not have any topical steroids.  Patient also reports increased bilateral lower extremity swelling that he states is only present secondary to the rash.  He does not take any other medications.   HPI     Prior to Admission medications   Medication Sig Start Date End Date Taking? Authorizing Provider  triamcinolone  cream (KENALOG ) 0.1 % Apply 1 Application topically 2 (two) times daily. 09/19/24  Yes Milind Raether, Marry RAMAN, PA-C  albuterol  (VENTOLIN  HFA) 108 (90 Base) MCG/ACT inhaler Inhale 2 puffs into the lungs every 4 (four) hours as needed for wheezing or shortness of breath. 07/19/24   Vicci Barnie NOVAK, MD  clindamycin  (CLEOCIN ) 300 MG capsule Take 1 capsule (300 mg total) by mouth 3 (three) times daily. 04/12/24   Vicci Barnie NOVAK, MD  clobetasol  cream (TEMOVATE ) 0.05 % Apply 1 Application topically 2 (two) times daily. Mix with CeraVe Anti-itch. Apply to affected areas twice a day. Do not apply to face 04/12/24   Vicci Barnie NOVAK, MD  fluticasone  (FLONASE ) 50 MCG/ACT nasal spray 1 spray each nostril daily x 1 week then PRN for allergy symptoms 04/11/23   Vicci Barnie NOVAK, MD  fluticasone  furoate-vilanterol (BREO ELLIPTA ) 200-25 MCG/ACT AEPB Inhale 1 puff into the lungs daily. 12/14/23   Vicci Barnie NOVAK, MD  hydrOXYzine  (ATARAX ) 25 MG tablet May take a half tablet in the morning for itch PRN. 06/12/24   Alm Delon SAILOR, DO  ipratropium-albuterol  (DUONEB) 0.5-2.5 (3) MG/3ML SOLN Take 3 mLs by nebulization every 6 (six) hours. 09/10/19   Black, Darice HERO, NP  Lebrikizumab-lbkz  (EBGLYSS ) 250 MG/2ML SOSY Inject 4 mLs into the skin every 14 (fourteen) days. 01/01/24   Alm Delon SAILOR, DO  Lebrikizumab-lbkz  (EBGLYSS ) 250 MG/2ML SOSY Inject 2 mLs into the skin every 30 (thirty) days. 01/01/24   Alm Delon SAILOR, DO  loratadine  (CLARITIN ) 10 MG tablet Take 1 tablet (10 mg total) by mouth daily. 08/14/23   Vicci Barnie NOVAK, MD  olopatadine  (PATANOL) 0.1 % ophthalmic solution Place 1 drop into both eyes 2 (two) times daily. 01/26/21   Vicci Barnie NOVAK, MD  Roflumilast  (ZORYVE ) 0.3 % CREA Apply to affected areas daily 06/12/24   Alm Delon SAILOR, DO    Allergies: Penicillins    Review of Systems  Skin:  Positive for rash.    Updated Vital Signs BP 115/67 (BP Location: Right Arm)   Pulse 98   Temp 97.6 F (36.4 C) (Oral)   Resp 18   Ht 5' 3 (1.6 m)   Wt 66.2 kg   SpO2 100%   BMI 25.86 kg/m   Physical Exam Vitals and nursing note reviewed.  Constitutional:      Appearance: Normal appearance. He is not ill-appearing.  Eyes:  General: No scleral icterus. Pulmonary:     Effort: Pulmonary effort is normal. No respiratory distress.  Musculoskeletal:        General: No deformity.  Skin:    Coloration: Skin is not jaundiced.     Findings: Rash present.     Comments: Patient with full body eczematous rash with excoriations and abrasions throughout his bilateral upper extremities, bilateral lower extremities extending to the knees, head and abdomen  Neurological:     General: No focal deficit present.     Mental Status: He is alert.  Psychiatric:        Mood and Affect: Mood normal.     (all labs ordered are listed, but only abnormal results are displayed) Labs Reviewed -  No data to display  EKG: None  Radiology: No results found.   Procedures   Medications Ordered in the ED - No data to display                               Medical Decision Making  This patient presents to the ED for concern of full-body rash, this involves an extensive number of treatment options, and is a complaint that carries with it a high risk of complications and morbidity.  Differential diagnosis includes: Eczema flare, Stevens-Johnson syndrome, cellulitis, impetigo,  Co morbidities:  history of eczema  Social Determinants of Health:   none  Additional history:  Patient followed by [facility] and has follow-up dermatology appointment scheduled for Tuesday.  Lab Tests:  Not indicated  Imaging Studies:  Not indicated   Medicines ordered and prescription drug management: No medication indicated at this time  Test Considered:   none  Critical Interventions:   none  Consultations Obtained: None  Problem List / ED Course:     ICD-10-CM   1. Eczema, unspecified type  L30.9       MDM: 53 year old m ale with history of eczema who presents emergency department for worsening symptoms.  He reports increased pain and itching all over his body and has mild excoriations and abrasions likely secondary to the itching.  Patient denies flulike symptoms, fever, body aches.  His mucosa is without excoriation and blistering.  Rash is consistent with eczema flareup.  Patient is not currently taking daily eczema medication, although he is on prednisone  at this time.  Patient has a follow-up appointment with dermatology on Tuesday.  He has a TB test and chest x-ray ordered to be performed outpatient, likely as a screening test so dermatology can start patient on a biologic medicine.  No indication for further workup at this time.  I have ordered patient topical Kenalog  for itching and pain.  I advised patient to trial this with Aquaphor until his appointment next week.   Patient verbalized understanding.  Vital signs stable.  Patient stable for discharge at this time.   Dispostion:  After consideration of the diagnostic results and the patients response to treatment, I feel that the patient would benefit from plan follow-up with dermatology and supportive care with Kenalog  ointment.    Final diagnoses:  Eczema, unspecified type    ED Discharge Orders          Ordered    predniSONE  (STERAPRED UNI-PAK 21 TAB) 10 MG (21) TBPK tablet  Daily,   Status:  Discontinued        09/19/24 1420    triamcinolone  cream (KENALOG ) 0.1 %  2 times daily  09/19/24 1429               Torrence Marry RAMAN, PA-C 09/19/24 1445

## 2024-09-19 NOTE — ED Triage Notes (Signed)
 Pt came in via POV d/t his skin covered in a rash-like pattern of dark spots & dry peeling skin that is spread all over his body. Pt states that he has been seen & the meds have not helped. Reports that it is very itchy & will not go away for the past year, denies any pain.

## 2024-09-19 NOTE — ED Provider Triage Note (Signed)
 CSN: [id number] Arrival date & time: 09/19/24  1314     Patient presents with: Skin Issue     Joseph Fitzgerald is a 53 y.o. male who presents emergency department for evaluation of full-body rash.  Patient states he was diagnosed with eczema about 1 year ago and was previously treated with eclipse.  However, per patient he was not responsive to that medicine.  Patient states his eczema is typically managed by his PCP.  He reports to the emergency department today because the rash has gotten worse over the last week and he states it is exceptionally itchy and painful.  He states he occasionally puts Aquaphor on the rash but does not have any topical steroids.  Patient also reports increased bilateral lower extremity swelling that he states is only present secondary to the rash.  He does not take any other medications.     HPI            Prior to Admission medications   Medication Sig Start Date End Date Taking? Authorizing Provider  triamcinolone  cream (KENALOG ) 0.1 % Apply 1 Application topically 2 (two) times daily. 09/19/24   Yes Zaryan Yakubov, Marry RAMAN, PA-C  albuterol  (VENTOLIN  HFA) 108 (90 Base) MCG/ACT inhaler Inhale 2 puffs into the lungs every 4 (four) hours as needed for wheezing or shortness of breath. 07/19/24     Vicci Barnie NOVAK, MD  clindamycin  (CLEOCIN ) 300 MG capsule Take 1 capsule (300 mg total) by mouth 3 (three) times daily. 04/12/24     Vicci Barnie NOVAK, MD  clobetasol  cream (TEMOVATE ) 0.05 % Apply 1 Application topically 2 (two) times daily. Mix with CeraVe Anti-itch. Apply to affected areas twice a day. Do not apply to face 04/12/24     Vicci Barnie NOVAK, MD  fluticasone  (FLONASE ) 50 MCG/ACT nasal spray 1 spray each nostril daily x 1 week then PRN for allergy symptoms 04/11/23     Vicci Barnie NOVAK, MD  fluticasone  furoate-vilanterol (BREO ELLIPTA ) 200-25 MCG/ACT AEPB Inhale 1 puff into the lungs daily. 12/14/23     Vicci Barnie NOVAK, MD  hydrOXYzine  (ATARAX ) 25 MG tablet May take  a half tablet in the morning for itch PRN. 06/12/24     Alm Delon SAILOR, DO  ipratropium-albuterol  (DUONEB) 0.5-2.5 (3) MG/3ML SOLN Take 3 mLs by nebulization every 6 (six) hours. 09/10/19     Black, Darice HERO, NP  Lebrikizumab-lbkz  (EBGLYSS ) 250 MG/2ML SOSY Inject 4 mLs into the skin every 14 (fourteen) days. 01/01/24     Alm Delon SAILOR, DO  Lebrikizumab-lbkz  (EBGLYSS ) 250 MG/2ML SOSY Inject 2 mLs into the skin every 30 (thirty) days. 01/01/24     Alm Delon SAILOR, DO  loratadine  (CLARITIN ) 10 MG tablet Take 1 tablet (10 mg total) by mouth daily. 08/14/23     Vicci Barnie NOVAK, MD  olopatadine  (PATANOL) 0.1 % ophthalmic solution Place 1 drop into both eyes 2 (two) times daily. 01/26/21     Vicci Barnie NOVAK, MD  Roflumilast  (ZORYVE ) 0.3 % CREA Apply to affected areas daily 06/12/24     Alm Delon SAILOR, DO      Allergies: Penicillins     Review of Systems  Skin:  Positive for rash.     Updated Vital Signs BP 115/67 (BP Location: Right Arm)   Pulse 98   Temp 97.6 F (36.4 C) (Oral)   Resp 18   Ht 5' 3 (1.6 m)   Wt 66.2 kg   SpO2 100%   BMI 25.86 kg/m  Physical Exam Vitals and nursing note reviewed.  Constitutional:      Appearance: Normal appearance. He is not ill-appearing.  Eyes:     General: No scleral icterus. Pulmonary:     Effort: Pulmonary effort is normal. No respiratory distress.  Musculoskeletal:        General: No deformity.  Skin:    Coloration: Skin is not jaundiced.     Findings: Rash present.     Comments: Patient with full body eczematous rash with excoriations and abrasions throughout his bilateral upper extremities, bilateral lower extremities extending to the knees, head and abdomen  Neurological:     General: No focal deficit present.     Mental Status: He is alert.  Psychiatric:        Mood and Affect: Mood normal.       (all labs ordered are listed, but only abnormal results are displayed) Labs Reviewed - No data to display   EKG: None    Radiology: Imaging Results (Last 48 hours)  No results found.       Procedures    Medications Ordered in the ED - No data to display                                 Medical Decision Making  This patient presents to the ED for concern of full-body rash, this involves an extensive number of treatment options, and is a complaint that carries with it a high risk of complications and morbidity.  Differential diagnosis includes: Eczema flare, Stevens-Johnson syndrome, cellulitis, impetigo,   Co morbidities:  history of eczema   Social Determinants of Health:    none   Additional history:   Patient followed by [facility] and has follow-up dermatology appointment scheduled for Tuesday.   Lab Tests:   Not indicated   Imaging Studies:   Not indicated     Medicines ordered and prescription drug management: No medication indicated at this time   Test Considered:    none   Critical Interventions:    none   Consultations Obtained: None   Problem List / ED Course:        ICD-10-CM    1. Eczema, unspecified type  L30.9           MDM: 53 year old m ale with history of eczema who presents emergency department for worsening symptoms.  He reports increased pain and itching all over his body and has mild excoriations and abrasions likely secondary to the itching.  Patient denies flulike symptoms, fever, body aches.  His mucosa is without excoriation and blistering.  Rash is consistent with eczema flareup.  Patient is not currently taking daily eczema medication, although he is on prednisone  at this time.  Patient has a follow-up appointment with dermatology on Tuesday.  He has a TB test and chest x-ray ordered to be performed outpatient, likely as a screening test so dermatology can start patient on a biologic medicine.  No indication for further workup at this time.  I have ordered patient topical Kenalog  for itching and pain.  I advised patient to trial this with Aquaphor  until his appointment next week.  Patient verbalized understanding.  Vital signs stable.  Patient stable for discharge at this time.     Dispostion:   After consideration of the diagnostic results and the patients response to treatment, I feel that the patient would benefit from plan follow-up with dermatology and  supportive care with Kenalog  ointment.       Final diagnoses:  Eczema, unspecified type      ED Discharge Orders             Ordered      predniSONE  (STERAPRED UNI-PAK 21 TAB) 10 MG (21) TBPK tablet  Daily,   Status:  Discontinued        09/19/24 1420      triamcinolone  cream (KENALOG ) 0.1 %  2 times daily        09/19/24 1429                      Marlin Brys, Marry RAMAN, PA-C 09/19/24 1445     Torrence Marry RAMAN, PA-C 09/22/24 [phone]

## 2024-09-19 NOTE — Discharge Instructions (Addendum)
 It was a pleasure taking care of you today. You were seen in the Emergency Department for evaluation of full body rash that is consistent with eczema. Your work-up was reassuring.  I am prescribing you with a steroid taper pack.  Please follow the directions written on the prescription for specific instructions on how to take the steroids.  Refer to the attached documentation for further management of your symptoms.  Follow-up with dermatology as scheduled next week.  Please return to the ER if you experience chest pain, trouble breathing, intractable nausea/vomiting or any other life threatening illnesses.

## 2024-09-24 ENCOUNTER — Ambulatory Visit (INDEPENDENT_AMBULATORY_CARE_PROVIDER_SITE_OTHER): Admitting: Dermatology

## 2024-09-24 ENCOUNTER — Encounter: Payer: Self-pay | Admitting: Dermatology

## 2024-09-24 VITALS — BP 121/76 | HR 60

## 2024-09-24 DIAGNOSIS — Z111 Encounter for screening for respiratory tuberculosis: Secondary | ICD-10-CM

## 2024-09-24 DIAGNOSIS — Z79899 Other long term (current) drug therapy: Secondary | ICD-10-CM | POA: Diagnosis not present

## 2024-09-24 DIAGNOSIS — L2089 Other atopic dermatitis: Secondary | ICD-10-CM | POA: Diagnosis not present

## 2024-09-24 DIAGNOSIS — L299 Pruritus, unspecified: Secondary | ICD-10-CM | POA: Diagnosis not present

## 2024-09-24 MED ORDER — NEMLUVIO 30 MG ~~LOC~~ AUIJ: 30.0000 mg | AUTO-INJECTOR | SUBCUTANEOUS | 0 refills | Status: AC | PRN

## 2024-09-24 MED ORDER — PREDNISONE 10 MG PO TABS: ORAL_TABLET | ORAL | 0 refills | Status: AC

## 2024-09-24 MED ORDER — NEMLUVIO 30 MG ~~LOC~~ AUIJ: 30.0000 mg | AUTO-INJECTOR | SUBCUTANEOUS | 11 refills | Status: AC

## 2024-09-24 MED ORDER — TRIAMCINOLONE ACETONIDE 0.1 % EX OINT: 1.0000 | TOPICAL_OINTMENT | Freq: Two times a day (BID) | CUTANEOUS | 4 refills | Status: AC

## 2024-09-24 NOTE — Progress Notes (Signed)
 Follow-Up Visit   Subjective  Joseph Fitzgerald is a 53 y.o. male who presents for the following: Atopic Dermatitis with Pruritus  Patient present today for follow up visit for  Atopic Dermatitis with Pruritus. Patient was last evaluated on 06/12/2024. At this visit patient was prescribed Cibinqo. Patient states he never received the medications. He states he attempted to go get the chest xray but he was given prednisone  instead. He states the prednisone  helps some but not much. Today he rates his itch 10 out of 10.  Patient reports sxs are worse. Patient denies medication changes.  Patient has previously tried and failed the following Medications: - Prednisone  20 mg 04/2013 - Current - Triamcinolone  0.1% 08/2015 - Current - Zyrtec  10 mg tablet 08/2015 - 07/2016 - Montelukast  (Singulair ) 10 mg 07/2016 -  - Methylprednisolone  04/2013 - - Mupirocin  Ointment 08/2015 - - Triamcinolone  0.5% Cream 10/2016 - 11/2016 - Claritin  02/2018 - Current - Zoryve  0.3%  - Benadryl  25 mg Capsule 10/2023 - Current - Hydroxyzine  03/5024 - Current - Clobetasol  0.05% Cream - Current - Dupixent 12/06/2023 - 12/20/2023 - Ebglyss  12/20/2023 - 04/16/2024  The following portions of the chart were reviewed this encounter and updated as appropriate: medications, allergies, medical history  Review of Systems:  No other skin or systemic complaints except as noted in HPI or Assessment and Plan.  Objective  Well appearing patient in no apparent distress; mood and affect are within normal limits.  A full examination was performed including scalp, head, eyes, ears, nose, lips, neck, chest, axillae, abdomen, back, buttocks, bilateral upper extremities, bilateral lower extremities, hands, feet, fingers, toes, fingernails, and toenails. All findings within normal limits unless otherwise noted below.   Relevant exam findings are noted in the Assessment and Plan.    Assessment & Plan     Erythrodermic atopic dermatitis with severe  pruritus Exam: Scaly pink papules coalescing to plaques, + excoriations  80% BSA IGA: 4, Itch 10/10,   Severe erythrodermic atopic dermatitis with systemic erythroderma, severely dry skin, excoriations, and active pruritus. Body surface area involvement is 80%. Pruritus is rated as 10/10, reduced to 8/10 with prednisone . Previous treatments were ineffective. Biopsy confirmed diagnosis. Due to indeterminate TB test, immunosuppressive treatments are deferred. Namluvio, an interleukin 31 itch receptor inhibitor, is planned to control pruritus without immunosuppression.  - Continue prednisone  for pruritus control. - Start topical Dermasmoothe Oil (fluocinolone 0.01%) two weeks on, two weeks off. - Initiate paperwork for Nemluvio. Anticipate approval within two weeks and arrange for injection training upon approval. - Educate on oatmeal baths for symptomatic relief.  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 Immunosuppressive therapy is deferred until TB status is clarified. Chest x-ray is necessary to rule out active TB, especially given systemic corticosteroid use. - Order chest x-ray at [facility] radiology department. - Repeat QuantiFERON Gold test to clarify TB status.   Pre-visit planning reviewing the last office visit, labs, imaging and care everywhere when applicable was 8 minutes Intra-visit was 35 minutes and included updating the relevant history, performing a video or in-person physical exam as appropriate, creating a treatment plan and used shared decision making with the patient.  Post-visit was 10 minutes that encompassed note completion, placing of orders, updating patient instructions and coordination of care.    Return in about 6 months (around 03/25/2025) for Eczema F/U.  I, Jetta Ager, am acting as neurosurgeon for  Delon Lenis,  DO.  Documentation: I have reviewed the above documentation for accuracy and completeness, and I agree with the above.  Delon Lenis, DO

## 2024-09-24 NOTE — Addendum Note (Signed)
 Addended by: Deneane Stifter J on: 09/24/2024 04:33 PM   Modules accepted: Orders

## 2024-09-24 NOTE — Patient Instructions (Addendum)
 VISIT SUMMARY:  Today, we discussed your severe erythrodermic atopic dermatitis and the persistent itching you have been experiencing. We also addressed the need to clarify your TB status due to your current immunosuppressive therapy.  YOUR PLAN:  -ERYTHRODERMIC ATOPIC DERMATITIS WITH SEVERE PRURITUS:  Erythrodermic atopic dermatitis is a severe form of eczema that causes widespread redness, scaling, and itching of the skin.  You will continue taking prednisone  to help control the itching and swelling. We will give you a 3 week taper starting at 40mg  daily We will also start using Dermatsmoothe Oil (fluocinolone 0.01%) treatment, applying it for two weeks and then taking a break for two weeks. (A full size sample was provided on office along with a full size sample of Aveeno eczema therapy gel cream) We are starting the paperwork for [facility], a new medication that targets the itch without suppressing your immune system.  Once approved, we will arrange for you to receive training on how to use it. Additionally, oatmeal baths can help provide some relief for your skin.  -SUSPECTED LATENT TUBERCULOSIS INFECTION: Latent tuberculosis infection means you have the bacteria that cause TB in your body, but they are not active. Because your TB test was indeterminate, we need to confirm whether you have latent TB before starting any new immunosuppressive treatments.  You will need to get a chest x-ray at the [facility] radiology department and repeat the QuantiFERON Gold blood test to clarify your TB status.  INSTRUCTIONS:  Please complete the chest x-ray and blood tests at the hospital as soon as possible. We will follow up with you once we have the results and the approval for Nemluvio.    Important Information   Due to recent changes in healthcare laws, you may see results of your pathology and/or laboratory studies on MyChart before the doctors have had a chance to review them. We understand that in  some cases there may be results that are confusing or concerning to you. Please understand that not all results are received at the same time and often the doctors may need to interpret multiple results in order to provide you with the best plan of care or course of treatment. Therefore, we ask that you please give us  2 business days to thoroughly review all your results before contacting the office for clarification. Should we see a critical lab result, you will be contacted sooner.     If You Need Anything After Your Visit   If you have any questions or concerns for your doctor, please call our main line at [phone]. If no one answers, please leave a voicemail as directed and we will return your call as soon as possible. Messages left after 4 pm will be answered the following business day.    You may also send us  a message via MyChart. We typically respond to MyChart messages within 1-2 business days.  For prescription refills, please ask your pharmacy to contact our office. Our fax number is [phone].  If you have an urgent issue when the clinic is closed that cannot wait until the next business day, you can page your doctor at the number below.     Please note that while we do our best to be available for urgent issues outside of office hours, we are not available 24/7.    If you have an urgent issue and are unable to reach us , you may choose to seek medical care at your doctor's office, retail clinic, urgent care center, or  emergency room.   If you have a medical emergency, please immediately call 911 or go to the emergency department. In the event of inclement weather, please call our main line at [phone] for an update on the status of any delays or closures.  Dermatology Medication Tips: Please keep the boxes that topical medications come in in order to help keep track of the instructions about where and how to use these. Pharmacies typically print the medication instructions  only on the boxes and not directly on the medication tubes.   If your medication is too expensive, please contact our office at [phone] or send us  a message through MyChart.    We are unable to tell what your co-pay for medications will be in advance as this is different depending on your insurance coverage. However, we may be able to find a substitute medication at lower cost or fill out paperwork to get insurance to cover a needed medication.    If a prior authorization is required to get your medication covered by your insurance company, please allow us  1-2 business days to complete this process.   Drug prices often vary depending on where the prescription is filled and some pharmacies may offer cheaper prices.   The website www.goodrx.com contains coupons for medications through different pharmacies. The prices here do not account for what the cost may be with help from insurance (it may be cheaper with your insurance), but the website can give you the price if you did not use any insurance.  - You can print the associated coupon and take it with your prescription to the pharmacy.  - You may also stop by our office during regular business hours and pick up a GoodRx coupon card.  - If you need your prescription sent electronically to a different pharmacy, notify our office through [facility] or by phone at [phone]

## 2024-09-30 ENCOUNTER — Ambulatory Visit (HOSPITAL_COMMUNITY)
Admission: RE | Admit: 2024-09-30 | Discharge: 2024-09-30 | Disposition: A | Discharge status: Home / Self Care | Source: Ambulatory Visit | Attending: Dermatology | Admitting: Dermatology

## 2024-09-30 DIAGNOSIS — Z111 Encounter for screening for respiratory tuberculosis: Secondary | ICD-10-CM | POA: Diagnosis present

## 2024-10-02 ENCOUNTER — Ambulatory Visit: Payer: Self-pay | Admitting: Dermatology

## 2024-10-02 LAB — QUANTIFERON-TB GOLD PLUS
QuantiFERON Mitogen Value: >10 [IU]/mL
QuantiFERON Nil Value: 0.02 [IU]/mL
QuantiFERON TB1 Ag Value: 0.02 [IU]/mL
QuantiFERON TB2 Ag Value: 0.01 [IU]/mL
QuantiFERON-TB Gold Plus: NEGATIVE

## 2024-10-02 NOTE — Progress Notes (Signed)
 Pt's repeat quantiferon gold was negative and his CXR was clear.

## 2025-03-25 ENCOUNTER — Ambulatory Visit: Admitting: Dermatology
